# Patient Record
Sex: Female | Born: 1975 | Race: White | Hispanic: No | Marital: Single | Smoking: Former smoker
Health system: Southern US, Community
[De-identification: ages and names within clinical notes are randomized; demographics above are authoritative.]

## PROBLEM LIST (undated history)

## (undated) DIAGNOSIS — E119 Type 2 diabetes mellitus without complications: Secondary | ICD-10-CM

## (undated) DIAGNOSIS — N159 Renal tubulo-interstitial disease, unspecified: Secondary | ICD-10-CM

## (undated) DIAGNOSIS — K469 Unspecified abdominal hernia without obstruction or gangrene: Secondary | ICD-10-CM

## (undated) DIAGNOSIS — I5032 Chronic diastolic (congestive) heart failure: Secondary | ICD-10-CM

## (undated) DIAGNOSIS — I10 Essential (primary) hypertension: Secondary | ICD-10-CM

## (undated) DIAGNOSIS — I509 Heart failure, unspecified: Secondary | ICD-10-CM

## (undated) DIAGNOSIS — B029 Zoster without complications: Secondary | ICD-10-CM

## (undated) DIAGNOSIS — J45909 Unspecified asthma, uncomplicated: Secondary | ICD-10-CM

## (undated) HISTORY — PX: TUBAL LIGATION: SHX77

## (undated) HISTORY — PX: OTHER SURGICAL HISTORY: SHX169

## (undated) HISTORY — PX: ABDOMINAL SURGERY: SHX537

## (undated) HISTORY — PX: CHOLECYSTECTOMY: SHX55

---

## 1998-08-16 ENCOUNTER — Encounter: Payer: Self-pay | Admitting: Emergency Medicine

## 1998-08-16 ENCOUNTER — Emergency Department (HOSPITAL_COMMUNITY): Admission: EM | Admit: 1998-08-16 | Discharge: 1998-08-16 | Payer: Self-pay | Admitting: Emergency Medicine

## 1998-12-27 ENCOUNTER — Emergency Department (HOSPITAL_COMMUNITY): Admission: EM | Admit: 1998-12-27 | Discharge: 1998-12-27 | Payer: Self-pay | Admitting: Emergency Medicine

## 1999-07-02 ENCOUNTER — Emergency Department (HOSPITAL_COMMUNITY): Admission: EM | Admit: 1999-07-02 | Discharge: 1999-07-03 | Payer: Self-pay

## 1999-07-29 ENCOUNTER — Encounter: Payer: Self-pay | Admitting: Emergency Medicine

## 1999-07-29 ENCOUNTER — Emergency Department (HOSPITAL_COMMUNITY): Admission: EM | Admit: 1999-07-29 | Discharge: 1999-07-29 | Payer: Self-pay | Admitting: Emergency Medicine

## 2000-09-19 ENCOUNTER — Emergency Department (HOSPITAL_COMMUNITY): Admission: EM | Admit: 2000-09-19 | Discharge: 2000-09-19 | Payer: Self-pay | Admitting: *Deleted

## 2003-12-16 ENCOUNTER — Observation Stay: Payer: Self-pay | Admitting: Obstetrics & Gynecology

## 2003-12-19 ENCOUNTER — Inpatient Hospital Stay: Payer: Self-pay

## 2004-01-24 ENCOUNTER — Emergency Department: Payer: Self-pay | Admitting: Unknown Physician Specialty

## 2004-02-13 ENCOUNTER — Emergency Department: Payer: Self-pay | Admitting: Emergency Medicine

## 2004-02-13 ENCOUNTER — Other Ambulatory Visit: Payer: Self-pay

## 2004-05-02 ENCOUNTER — Emergency Department: Payer: Self-pay | Admitting: Emergency Medicine

## 2004-05-21 ENCOUNTER — Emergency Department: Payer: Self-pay | Admitting: Emergency Medicine

## 2004-06-07 ENCOUNTER — Emergency Department: Payer: Self-pay | Admitting: Emergency Medicine

## 2004-07-22 ENCOUNTER — Other Ambulatory Visit: Payer: Self-pay

## 2004-07-22 ENCOUNTER — Emergency Department: Payer: Self-pay | Admitting: Unknown Physician Specialty

## 2004-07-28 ENCOUNTER — Emergency Department: Payer: Self-pay | Admitting: Emergency Medicine

## 2004-11-10 ENCOUNTER — Emergency Department: Payer: Self-pay | Admitting: Emergency Medicine

## 2005-01-29 ENCOUNTER — Emergency Department: Payer: Self-pay | Admitting: Internal Medicine

## 2005-04-24 ENCOUNTER — Ambulatory Visit: Payer: Self-pay | Admitting: Surgery

## 2005-05-20 ENCOUNTER — Emergency Department: Payer: Self-pay | Admitting: Emergency Medicine

## 2005-08-27 ENCOUNTER — Emergency Department: Payer: Self-pay

## 2006-07-20 ENCOUNTER — Emergency Department: Payer: Self-pay | Admitting: Emergency Medicine

## 2006-07-27 ENCOUNTER — Emergency Department: Payer: Self-pay | Admitting: Emergency Medicine

## 2006-08-01 ENCOUNTER — Emergency Department: Payer: Self-pay | Admitting: Emergency Medicine

## 2006-09-22 ENCOUNTER — Emergency Department: Payer: Self-pay | Admitting: Emergency Medicine

## 2006-09-29 ENCOUNTER — Emergency Department: Payer: Self-pay

## 2006-12-25 ENCOUNTER — Emergency Department (HOSPITAL_COMMUNITY): Admission: EM | Admit: 2006-12-25 | Discharge: 2006-12-25 | Payer: Self-pay | Admitting: Emergency Medicine

## 2007-01-16 ENCOUNTER — Emergency Department: Payer: Self-pay | Admitting: Emergency Medicine

## 2007-03-13 ENCOUNTER — Emergency Department (HOSPITAL_COMMUNITY): Admission: EM | Admit: 2007-03-13 | Discharge: 2007-03-13 | Payer: Self-pay | Admitting: Emergency Medicine

## 2007-04-17 ENCOUNTER — Emergency Department: Payer: Self-pay | Admitting: Emergency Medicine

## 2007-04-25 ENCOUNTER — Emergency Department: Payer: Self-pay | Admitting: Internal Medicine

## 2007-06-01 ENCOUNTER — Other Ambulatory Visit: Payer: Self-pay

## 2007-06-01 ENCOUNTER — Emergency Department: Payer: Self-pay | Admitting: Emergency Medicine

## 2008-01-07 ENCOUNTER — Emergency Department: Payer: Self-pay | Admitting: Emergency Medicine

## 2008-02-29 ENCOUNTER — Emergency Department: Payer: Self-pay | Admitting: Unknown Physician Specialty

## 2008-05-17 ENCOUNTER — Emergency Department: Payer: Self-pay | Admitting: Unknown Physician Specialty

## 2008-05-20 ENCOUNTER — Emergency Department: Payer: Self-pay | Admitting: Emergency Medicine

## 2008-06-06 ENCOUNTER — Emergency Department: Payer: Self-pay | Admitting: Emergency Medicine

## 2008-06-10 ENCOUNTER — Emergency Department: Payer: Self-pay | Admitting: Emergency Medicine

## 2008-06-13 ENCOUNTER — Emergency Department: Payer: Self-pay | Admitting: Internal Medicine

## 2008-06-19 ENCOUNTER — Emergency Department: Payer: Self-pay | Admitting: Emergency Medicine

## 2008-06-24 ENCOUNTER — Emergency Department: Payer: Self-pay | Admitting: Emergency Medicine

## 2008-07-01 ENCOUNTER — Emergency Department: Payer: Self-pay | Admitting: Emergency Medicine

## 2008-07-13 ENCOUNTER — Emergency Department: Payer: Self-pay | Admitting: Emergency Medicine

## 2008-07-17 ENCOUNTER — Emergency Department: Payer: Self-pay | Admitting: Emergency Medicine

## 2008-07-24 ENCOUNTER — Emergency Department: Payer: Self-pay | Admitting: Emergency Medicine

## 2008-08-01 ENCOUNTER — Emergency Department: Payer: Self-pay | Admitting: Internal Medicine

## 2008-08-28 ENCOUNTER — Emergency Department: Payer: Self-pay | Admitting: Emergency Medicine

## 2008-10-27 ENCOUNTER — Emergency Department: Payer: Self-pay | Admitting: Emergency Medicine

## 2008-10-30 ENCOUNTER — Emergency Department: Payer: Self-pay | Admitting: Emergency Medicine

## 2008-11-02 ENCOUNTER — Emergency Department: Payer: Self-pay | Admitting: Emergency Medicine

## 2008-12-11 ENCOUNTER — Emergency Department: Payer: Self-pay | Admitting: Emergency Medicine

## 2008-12-12 ENCOUNTER — Emergency Department: Payer: Self-pay | Admitting: Emergency Medicine

## 2008-12-23 ENCOUNTER — Emergency Department: Payer: Self-pay | Admitting: Emergency Medicine

## 2008-12-28 ENCOUNTER — Emergency Department: Payer: Self-pay | Admitting: Emergency Medicine

## 2009-02-17 ENCOUNTER — Emergency Department: Payer: Self-pay | Admitting: Emergency Medicine

## 2009-02-20 ENCOUNTER — Emergency Department: Payer: Self-pay | Admitting: Emergency Medicine

## 2009-03-17 ENCOUNTER — Emergency Department: Payer: Self-pay | Admitting: Emergency Medicine

## 2009-03-29 ENCOUNTER — Emergency Department: Payer: Self-pay | Admitting: Emergency Medicine

## 2009-04-24 ENCOUNTER — Emergency Department: Payer: Self-pay | Admitting: Emergency Medicine

## 2009-06-14 ENCOUNTER — Emergency Department: Payer: Self-pay | Admitting: Emergency Medicine

## 2009-06-15 ENCOUNTER — Emergency Department (HOSPITAL_COMMUNITY): Admission: EM | Admit: 2009-06-15 | Discharge: 2009-06-16 | Payer: Self-pay | Admitting: Emergency Medicine

## 2009-06-19 ENCOUNTER — Emergency Department: Payer: Self-pay | Admitting: Unknown Physician Specialty

## 2009-06-23 ENCOUNTER — Emergency Department: Payer: Self-pay | Admitting: Emergency Medicine

## 2009-07-12 ENCOUNTER — Emergency Department: Payer: Self-pay | Admitting: Emergency Medicine

## 2009-07-15 ENCOUNTER — Emergency Department: Payer: Self-pay | Admitting: Emergency Medicine

## 2009-07-17 ENCOUNTER — Inpatient Hospital Stay: Payer: Self-pay | Admitting: Specialist

## 2009-07-21 LAB — PATHOLOGY REPORT

## 2009-08-08 ENCOUNTER — Emergency Department: Payer: Self-pay | Admitting: Emergency Medicine

## 2009-08-09 ENCOUNTER — Emergency Department: Payer: Self-pay | Admitting: Emergency Medicine

## 2009-08-11 ENCOUNTER — Emergency Department: Payer: Self-pay | Admitting: Emergency Medicine

## 2009-09-06 ENCOUNTER — Emergency Department: Payer: Self-pay | Admitting: Emergency Medicine

## 2009-09-09 ENCOUNTER — Emergency Department: Payer: Self-pay | Admitting: Emergency Medicine

## 2009-09-11 ENCOUNTER — Emergency Department: Payer: Self-pay | Admitting: Internal Medicine

## 2009-09-14 ENCOUNTER — Emergency Department: Payer: Self-pay | Admitting: Emergency Medicine

## 2009-10-26 ENCOUNTER — Emergency Department: Payer: Self-pay | Admitting: Emergency Medicine

## 2009-11-02 ENCOUNTER — Emergency Department (HOSPITAL_COMMUNITY): Admission: EM | Admit: 2009-11-02 | Discharge: 2009-11-02 | Payer: Self-pay | Admitting: Emergency Medicine

## 2009-11-08 ENCOUNTER — Emergency Department: Payer: Self-pay | Admitting: Unknown Physician Specialty

## 2009-11-11 ENCOUNTER — Emergency Department (HOSPITAL_COMMUNITY): Admission: EM | Admit: 2009-11-11 | Discharge: 2009-11-11 | Payer: Self-pay | Admitting: Emergency Medicine

## 2009-11-14 ENCOUNTER — Emergency Department: Payer: Self-pay | Admitting: Emergency Medicine

## 2009-12-09 ENCOUNTER — Emergency Department: Payer: Self-pay | Admitting: Emergency Medicine

## 2010-01-02 ENCOUNTER — Emergency Department: Payer: Self-pay | Admitting: Emergency Medicine

## 2010-01-09 ENCOUNTER — Emergency Department: Payer: Self-pay | Admitting: Emergency Medicine

## 2010-01-11 ENCOUNTER — Emergency Department: Payer: Self-pay | Admitting: Unknown Physician Specialty

## 2010-01-12 ENCOUNTER — Emergency Department (HOSPITAL_COMMUNITY)
Admission: EM | Admit: 2010-01-12 | Discharge: 2010-01-12 | Payer: Self-pay | Source: Home / Self Care | Admitting: Emergency Medicine

## 2010-01-12 LAB — URINALYSIS, ROUTINE W REFLEX MICROSCOPIC
Bilirubin Urine: NEGATIVE
Ketones, ur: NEGATIVE mg/dL
Leukocytes, UA: NEGATIVE
Nitrite: NEGATIVE
Protein, ur: 30 mg/dL — AB
Specific Gravity, Urine: 1.031 — ABNORMAL HIGH (ref 1.005–1.030)
Urine Glucose, Fasting: >1000 mg/dL — AB
Urobilinogen, UA: 0.2 mg/dL (ref 0.0–1.0)
pH: 5.5 (ref 5.0–8.0)

## 2010-01-12 LAB — POCT I-STAT, CHEM 8
BUN: 8 mg/dL (ref 6–23)
Calcium, Ion: 1.05 mmol/L — ABNORMAL LOW (ref 1.12–1.32)
Chloride: 100 mEq/L (ref 96–112)
Creatinine, Ser: 0.7 mg/dL (ref 0.4–1.2)
Glucose, Bld: 230 mg/dL — ABNORMAL HIGH (ref 70–99)
HCT: 40 % (ref 36.0–46.0)
Hemoglobin: 13.6 g/dL (ref 12.0–15.0)
Potassium: 3.7 mEq/L (ref 3.5–5.1)
Sodium: 136 mEq/L (ref 135–145)
TCO2: 26 mmol/L (ref 0–100)

## 2010-01-12 LAB — URINE MICROSCOPIC-ADD ON

## 2010-01-12 LAB — GLUCOSE, CAPILLARY: Glucose-Capillary: 232 mg/dL — ABNORMAL HIGH (ref 70–99)

## 2010-01-12 LAB — POCT PREGNANCY, URINE: Preg Test, Ur: NEGATIVE

## 2010-01-14 ENCOUNTER — Emergency Department: Payer: Self-pay | Admitting: Emergency Medicine

## 2010-03-05 ENCOUNTER — Inpatient Hospital Stay: Payer: Self-pay | Admitting: *Deleted

## 2010-03-17 ENCOUNTER — Emergency Department: Payer: Self-pay | Admitting: Emergency Medicine

## 2010-03-22 LAB — URINE MICROSCOPIC-ADD ON

## 2010-03-22 LAB — URINALYSIS, ROUTINE W REFLEX MICROSCOPIC
Bilirubin Urine: NEGATIVE
Glucose, UA: 250 mg/dL — AB
Ketones, ur: NEGATIVE mg/dL
Nitrite: NEGATIVE
Protein, ur: 30 mg/dL — AB
Specific Gravity, Urine: 1.015 (ref 1.005–1.030)
Urobilinogen, UA: 0.2 mg/dL (ref 0.0–1.0)
pH: 6.5 (ref 5.0–8.0)

## 2010-03-22 LAB — GLUCOSE, CAPILLARY: Glucose-Capillary: 291 mg/dL — ABNORMAL HIGH (ref 70–99)

## 2010-03-22 LAB — POCT PREGNANCY, URINE: Preg Test, Ur: NEGATIVE

## 2010-03-23 LAB — DIFFERENTIAL
Basophils Absolute: 0 10*3/uL (ref 0.0–0.1)
Lymphocytes Relative: 25 % (ref 12–46)
Monocytes Absolute: 0.7 10*3/uL (ref 0.1–1.0)
Neutro Abs: 8 10*3/uL — ABNORMAL HIGH (ref 1.7–7.7)
Neutrophils Relative %: 68 % (ref 43–77)

## 2010-03-23 LAB — PREGNANCY, URINE: Preg Test, Ur: NEGATIVE

## 2010-03-23 LAB — URINALYSIS, ROUTINE W REFLEX MICROSCOPIC
Bilirubin Urine: NEGATIVE
Glucose, UA: 100 mg/dL — AB
Hgb urine dipstick: NEGATIVE
Ketones, ur: NEGATIVE mg/dL
Nitrite: NEGATIVE
Protein, ur: NEGATIVE mg/dL
Specific Gravity, Urine: 1.022 (ref 1.005–1.030)
Urobilinogen, UA: 1 mg/dL (ref 0.0–1.0)
pH: 6.5 (ref 5.0–8.0)

## 2010-03-23 LAB — CBC
HCT: 36.5 % (ref 36.0–46.0)
MCH: 21 pg — ABNORMAL LOW (ref 26.0–34.0)
MCV: 70.9 fL — ABNORMAL LOW (ref 78.0–100.0)
Platelets: 253 10*3/uL (ref 150–400)
RBC: 5.15 MIL/uL — ABNORMAL HIGH (ref 3.87–5.11)
RDW: 18.1 % — ABNORMAL HIGH (ref 11.5–15.5)

## 2010-03-23 LAB — POCT I-STAT 3, VENOUS BLOOD GAS (G3P V)
Acid-Base Excess: 1 mmol/L (ref 0.0–2.0)
Bicarbonate: 26.9 mEq/L — ABNORMAL HIGH (ref 20.0–24.0)
O2 Saturation: 53 %
TCO2: 28 mmol/L (ref 0–100)
pCO2, Ven: 46.4 mmHg (ref 45.0–50.0)
pH, Ven: 7.371 — ABNORMAL HIGH (ref 7.250–7.300)
pO2, Ven: 29 mmHg — CL (ref 30.0–45.0)

## 2010-03-23 LAB — COMPREHENSIVE METABOLIC PANEL
Albumin: 2.9 g/dL — ABNORMAL LOW (ref 3.5–5.2)
BUN: 9 mg/dL (ref 6–23)
Chloride: 102 mEq/L (ref 96–112)
Creatinine, Ser: 0.64 mg/dL (ref 0.4–1.2)
Glucose, Bld: 224 mg/dL — ABNORMAL HIGH (ref 70–99)
Total Bilirubin: 0.5 mg/dL (ref 0.3–1.2)

## 2010-03-23 LAB — GLUCOSE, CAPILLARY: Glucose-Capillary: 208 mg/dL — ABNORMAL HIGH (ref 70–99)

## 2010-03-23 LAB — LIPASE, BLOOD: Lipase: 24 U/L (ref 11–59)

## 2010-04-07 ENCOUNTER — Ambulatory Visit: Payer: Self-pay | Admitting: Gastroenterology

## 2010-04-20 ENCOUNTER — Emergency Department: Payer: Self-pay | Admitting: Emergency Medicine

## 2010-05-25 ENCOUNTER — Emergency Department: Payer: Self-pay | Admitting: Emergency Medicine

## 2010-08-17 ENCOUNTER — Emergency Department: Payer: Self-pay | Admitting: Emergency Medicine

## 2010-09-11 ENCOUNTER — Emergency Department: Payer: Self-pay | Admitting: Emergency Medicine

## 2010-09-16 ENCOUNTER — Emergency Department: Payer: Self-pay

## 2010-10-03 LAB — COMPREHENSIVE METABOLIC PANEL
ALT: 17
AST: 16
CO2: 30
Calcium: 8.3 — ABNORMAL LOW
Creatinine, Ser: 0.65
GFR calc Af Amer: >60
GFR calc non Af Amer: >60
Glucose, Bld: 87
Sodium: 135
Total Protein: 6.4

## 2010-10-03 LAB — URINALYSIS, ROUTINE W REFLEX MICROSCOPIC
Glucose, UA: NEGATIVE
Nitrite: NEGATIVE
Specific Gravity, Urine: 1.024
pH: 7

## 2010-10-03 LAB — DIFFERENTIAL
Eosinophils Absolute: 0.2
Lymphocytes Relative: 29
Lymphs Abs: 2.9
Monocytes Relative: 7
Neutrophils Relative %: 62

## 2010-10-03 LAB — CBC
MCHC: 31.8
MCV: 73.2 — ABNORMAL LOW
RDW: 16.7 — ABNORMAL HIGH

## 2010-10-03 LAB — URINE MICROSCOPIC-ADD ON

## 2010-10-14 LAB — URINALYSIS, ROUTINE W REFLEX MICROSCOPIC
Glucose, UA: NEGATIVE
pH: 6.5

## 2010-10-14 LAB — I-STAT 8, (EC8 V) (CONVERTED LAB)
Acid-base deficit: 1
BUN: 15
Chloride: 106
HCT: 40
Potassium: 4.6
pH, Ven: 7.307 — ABNORMAL HIGH

## 2010-10-14 LAB — DIFFERENTIAL
Basophils Absolute: 0.1
Lymphocytes Relative: 24
Lymphs Abs: 2.5
Monocytes Absolute: 0.8
Neutro Abs: 7

## 2010-10-14 LAB — CBC
HCT: 36
Hemoglobin: 11.4 — ABNORMAL LOW
RBC: 4.77
RDW: 18 — ABNORMAL HIGH
WBC: 10.5

## 2010-10-14 LAB — URINE MICROSCOPIC-ADD ON

## 2010-10-14 LAB — URINE CULTURE

## 2010-10-14 LAB — POCT I-STAT CREATININE
Creatinine, Ser: 0.9
Operator id: 198171

## 2010-11-13 ENCOUNTER — Emergency Department: Payer: Self-pay | Admitting: Emergency Medicine

## 2010-12-05 ENCOUNTER — Emergency Department: Payer: Self-pay | Admitting: *Deleted

## 2011-03-09 ENCOUNTER — Emergency Department: Payer: Self-pay | Admitting: Emergency Medicine

## 2011-06-26 ENCOUNTER — Emergency Department: Payer: Self-pay | Admitting: Emergency Medicine

## 2011-09-10 ENCOUNTER — Emergency Department: Payer: Self-pay | Admitting: Emergency Medicine

## 2011-11-12 ENCOUNTER — Encounter (HOSPITAL_COMMUNITY): Payer: Self-pay | Admitting: Physical Medicine and Rehabilitation

## 2011-11-12 ENCOUNTER — Emergency Department (HOSPITAL_COMMUNITY)
Admission: EM | Admit: 2011-11-12 | Discharge: 2011-11-12 | Disposition: A | Discharge status: Home / Self Care | Payer: Medicaid Other | Attending: Emergency Medicine | Admitting: Emergency Medicine

## 2011-11-12 DIAGNOSIS — G8921 Chronic pain due to trauma: Secondary | ICD-10-CM | POA: Insufficient documentation

## 2011-11-12 DIAGNOSIS — G8929 Other chronic pain: Secondary | ICD-10-CM

## 2011-11-12 DIAGNOSIS — J45909 Unspecified asthma, uncomplicated: Secondary | ICD-10-CM | POA: Insufficient documentation

## 2011-11-12 DIAGNOSIS — Z794 Long term (current) use of insulin: Secondary | ICD-10-CM | POA: Insufficient documentation

## 2011-11-12 DIAGNOSIS — Z8719 Personal history of other diseases of the digestive system: Secondary | ICD-10-CM | POA: Insufficient documentation

## 2011-11-12 DIAGNOSIS — E119 Type 2 diabetes mellitus without complications: Secondary | ICD-10-CM | POA: Insufficient documentation

## 2011-11-12 DIAGNOSIS — Z79899 Other long term (current) drug therapy: Secondary | ICD-10-CM | POA: Insufficient documentation

## 2011-11-12 HISTORY — DX: Unspecified asthma, uncomplicated: J45.909

## 2011-11-12 HISTORY — DX: Unspecified abdominal hernia without obstruction or gangrene: K46.9

## 2011-11-12 HISTORY — DX: Type 2 diabetes mellitus without complications: E11.9

## 2011-11-12 LAB — URINALYSIS, ROUTINE W REFLEX MICROSCOPIC
Bilirubin Urine: NEGATIVE
Hgb urine dipstick: NEGATIVE
Protein, ur: 100 mg/dL — AB
Urobilinogen, UA: 1 mg/dL (ref 0.0–1.0)

## 2011-11-12 LAB — URINE MICROSCOPIC-ADD ON
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
## 2011-11-12 NOTE — ED Notes (Signed)
Patient very agitated and complaining about "the way she been treated". Calmed and reassured patient.

## 2011-12-30 ENCOUNTER — Emergency Department: Payer: Self-pay | Admitting: Emergency Medicine

## 2011-12-30 LAB — URINALYSIS, COMPLETE
Bacteria: NONE SEEN
Bilirubin,UR: NEGATIVE
Glucose,UR: >=500 mg/dL (ref 0–75)
Ketone: NEGATIVE
Nitrite: NEGATIVE
Ph: 6 (ref 4.5–8.0)
Specific Gravity: 1.028 (ref 1.003–1.030)
Squamous Epithelial: 1

## 2011-12-30 LAB — CBC
HGB: 11.9 g/dL — ABNORMAL LOW (ref 12.0–16.0)
MCH: 26.7 pg (ref 26.0–34.0)
MCV: 82 fL (ref 80–100)
Platelet: 235 10*3/uL (ref 150–440)
RBC: 4.46 10*6/uL (ref 3.80–5.20)

## 2011-12-30 LAB — COMPREHENSIVE METABOLIC PANEL
Albumin: 2.9 g/dL — ABNORMAL LOW (ref 3.4–5.0)
Anion Gap: 10 (ref 7–16)
Calcium, Total: 8.3 mg/dL — ABNORMAL LOW (ref 8.5–10.1)
Chloride: 100 mmol/L (ref 98–107)
EGFR (Non-African Amer.): >60
Glucose: 399 mg/dL — ABNORMAL HIGH (ref 65–99)
Osmolality: 282 (ref 275–301)
Potassium: 4.4 mmol/L (ref 3.5–5.1)
SGOT(AST): 37 U/L (ref 15–37)
Sodium: 133 mmol/L — ABNORMAL LOW (ref 136–145)

## 2011-12-30 LAB — TROPONIN I: Troponin-I: <0.02 ng/mL

## 2012-02-13 ENCOUNTER — Emergency Department: Payer: Self-pay | Admitting: Emergency Medicine

## 2012-02-14 LAB — URINALYSIS, COMPLETE
Bilirubin,UR: NEGATIVE
Glucose,UR: >=500 mg/dL (ref 0–75)
Ketone: NEGATIVE
Nitrite: NEGATIVE
Protein: NEGATIVE
RBC,UR: 3 /HPF (ref 0–5)
Specific Gravity: 1.027 (ref 1.003–1.030)
Squamous Epithelial: 3

## 2012-02-14 LAB — BASIC METABOLIC PANEL
Anion Gap: 9 (ref 7–16)
BUN: 11 mg/dL (ref 7–18)
Calcium, Total: 8.9 mg/dL (ref 8.5–10.1)
Creatinine: 0.79 mg/dL (ref 0.60–1.30)
EGFR (African American): >60
EGFR (Non-African Amer.): >60
Osmolality: 280 (ref 275–301)

## 2012-02-14 LAB — CBC
HCT: 40.8 % (ref 35.0–47.0)
HGB: 12.6 g/dL (ref 12.0–16.0)
MCH: 25.8 pg — ABNORMAL LOW (ref 26.0–34.0)
MCHC: 30.9 g/dL — ABNORMAL LOW (ref 32.0–36.0)
MCV: 84 fL (ref 80–100)
Platelet: 277 10*3/uL (ref 150–440)
RBC: 4.89 10*6/uL (ref 3.80–5.20)
RDW: 15.8 % — ABNORMAL HIGH (ref 11.5–14.5)

## 2012-02-16 LAB — BASIC METABOLIC PANEL
BUN: 7 mg/dL (ref 7–18)
Creatinine: 0.81 mg/dL (ref 0.60–1.30)
EGFR (African American): >60
EGFR (Non-African Amer.): >60
Glucose: 303 mg/dL — ABNORMAL HIGH (ref 65–99)
Osmolality: 276 (ref 275–301)
Potassium: 3.7 mmol/L (ref 3.5–5.1)

## 2012-02-16 LAB — URINALYSIS, COMPLETE
Bilirubin,UR: NEGATIVE
Glucose,UR: >=500 mg/dL (ref 0–75)
Nitrite: NEGATIVE
Ph: 5 (ref 4.5–8.0)
Protein: 30
Specific Gravity: 1.036 (ref 1.003–1.030)

## 2012-02-16 LAB — CBC
MCH: 26.8 pg (ref 26.0–34.0)
MCHC: 32.1 g/dL (ref 32.0–36.0)
MCV: 83 fL (ref 80–100)
RBC: 5.33 10*6/uL — ABNORMAL HIGH (ref 3.80–5.20)
RDW: 15.2 % — ABNORMAL HIGH (ref 11.5–14.5)
WBC: 13.6 10*3/uL — ABNORMAL HIGH (ref 3.6–11.0)

## 2012-02-17 ENCOUNTER — Observation Stay: Payer: Self-pay | Admitting: Internal Medicine

## 2012-02-17 LAB — CBC WITH DIFFERENTIAL/PLATELET
Basophil #: 0 10*3/uL (ref 0.0–0.1)
Basophil %: 0.5 %
Eosinophil #: 0.1 10*3/uL (ref 0.0–0.7)
HGB: 12.1 g/dL (ref 12.0–16.0)
Lymphocyte %: 23.9 %
MCHC: 32.1 g/dL (ref 32.0–36.0)
Monocyte #: 0.4 x10 3/mm (ref 0.2–0.9)
Neutrophil %: 69.1 %
Platelet: 206 10*3/uL (ref 150–440)
WBC: 7.4 10*3/uL (ref 3.6–11.0)

## 2012-02-17 LAB — COMPREHENSIVE METABOLIC PANEL
Albumin: 2.7 g/dL — ABNORMAL LOW (ref 3.4–5.0)
BUN: 5 mg/dL — ABNORMAL LOW (ref 7–18)
Bilirubin,Total: 0.4 mg/dL (ref 0.2–1.0)
Co2: 25 mmol/L (ref 21–32)
Creatinine: 0.84 mg/dL (ref 0.60–1.30)
EGFR (African American): >60
EGFR (Non-African Amer.): >60
Osmolality: 278 (ref 275–301)
Potassium: 3.7 mmol/L (ref 3.5–5.1)
SGOT(AST): 99 U/L — ABNORMAL HIGH (ref 15–37)
SGPT (ALT): 66 U/L (ref 12–78)
Sodium: 133 mmol/L — ABNORMAL LOW (ref 136–145)

## 2012-02-17 LAB — HEPATIC FUNCTION PANEL A (ARMC)
Albumin: 3.3 g/dL — ABNORMAL LOW (ref 3.4–5.0)
Alkaline Phosphatase: 81 U/L (ref 50–136)
Bilirubin, Direct: 0.1 mg/dL (ref 0.00–0.20)
Bilirubin,Total: 0.5 mg/dL (ref 0.2–1.0)
SGPT (ALT): 69 U/L (ref 12–78)

## 2012-02-17 LAB — LIPASE, BLOOD: Lipase: 101 U/L (ref 73–393)

## 2012-02-18 LAB — HEPATIC FUNCTION PANEL A (ARMC)
Alkaline Phosphatase: 65 U/L (ref 50–136)
Bilirubin,Total: 0.2 mg/dL (ref 0.2–1.0)
SGOT(AST): 69 U/L — ABNORMAL HIGH (ref 15–37)
Total Protein: 5.1 g/dL — ABNORMAL LOW (ref 6.4–8.2)

## 2012-02-18 LAB — HEMOGLOBIN A1C: Hemoglobin A1C: 11.5 % — ABNORMAL HIGH (ref 4.2–6.3)

## 2012-02-18 LAB — SODIUM: Sodium: 139 mmol/L (ref 136–145)

## 2012-02-18 LAB — HEMOGLOBIN
HGB: 10.9 g/dL — ABNORMAL LOW (ref 12.0–16.0)
HGB: 11.2 g/dL — ABNORMAL LOW (ref 12.0–16.0)

## 2012-02-19 LAB — URINALYSIS, COMPLETE
Bilirubin,UR: NEGATIVE
Ph: 5 (ref 4.5–8.0)
Protein: NEGATIVE
RBC,UR: <1 /HPF (ref 0–5)
Specific Gravity: 1.015 (ref 1.003–1.030)
Squamous Epithelial: 6
WBC UR: 3 /HPF (ref 0–5)

## 2012-02-20 LAB — CBC WITH DIFFERENTIAL/PLATELET
Basophil #: 0 10*3/uL (ref 0.0–0.1)
Eosinophil %: 1.5 %
HCT: 34.1 % — ABNORMAL LOW (ref 35.0–47.0)
HGB: 11.3 g/dL — ABNORMAL LOW (ref 12.0–16.0)
Lymphocyte %: 41.1 %
MCH: 27.6 pg (ref 26.0–34.0)
MCHC: 33.2 g/dL (ref 32.0–36.0)
MCV: 83 fL (ref 80–100)
Monocyte #: 0.6 x10 3/mm (ref 0.2–0.9)
Neutrophil %: 50.4 %
Platelet: 193 10*3/uL (ref 150–440)
RDW: 15.4 % — ABNORMAL HIGH (ref 11.5–14.5)
WBC: 8.7 10*3/uL (ref 3.6–11.0)

## 2012-02-20 LAB — BASIC METABOLIC PANEL
Anion Gap: 5 — ABNORMAL LOW (ref 7–16)
BUN: 6 mg/dL — ABNORMAL LOW (ref 7–18)
Calcium, Total: 8.2 mg/dL — ABNORMAL LOW (ref 8.5–10.1)
Chloride: 104 mmol/L (ref 98–107)
Osmolality: 277 (ref 275–301)
Potassium: 3.8 mmol/L (ref 3.5–5.1)
Sodium: 136 mmol/L (ref 136–145)

## 2012-02-22 LAB — CULTURE, BLOOD (SINGLE)

## 2012-02-25 ENCOUNTER — Emergency Department: Payer: Self-pay | Admitting: Emergency Medicine

## 2012-02-25 LAB — URINALYSIS, COMPLETE
Glucose,UR: NEGATIVE mg/dL (ref 0–75)
Ketone: NEGATIVE
Squamous Epithelial: 9
WBC UR: 3 /HPF (ref 0–5)

## 2012-02-25 LAB — COMPREHENSIVE METABOLIC PANEL
Albumin: 3 g/dL — ABNORMAL LOW (ref 3.4–5.0)
Anion Gap: 6 — ABNORMAL LOW (ref 7–16)
Bilirubin,Total: 0.2 mg/dL (ref 0.2–1.0)
Creatinine: 0.86 mg/dL (ref 0.60–1.30)
EGFR (African American): >60
EGFR (Non-African Amer.): >60
Osmolality: 270 (ref 275–301)
Potassium: 4.5 mmol/L (ref 3.5–5.1)
SGOT(AST): 36 U/L (ref 15–37)
Total Protein: 7.2 g/dL (ref 6.4–8.2)

## 2012-02-25 LAB — CBC
HGB: 12.1 g/dL (ref 12.0–16.0)
MCHC: 31.7 g/dL — ABNORMAL LOW (ref 32.0–36.0)
RBC: 4.59 10*6/uL (ref 3.80–5.20)
RDW: 15.2 % — ABNORMAL HIGH (ref 11.5–14.5)

## 2012-02-25 LAB — LIPASE, BLOOD: Lipase: 107 U/L (ref 73–393)

## 2012-03-05 ENCOUNTER — Emergency Department: Payer: Self-pay | Admitting: Emergency Medicine

## 2012-03-05 LAB — CBC
HGB: 12.2 g/dL (ref 12.0–16.0)
MCH: 26.6 pg (ref 26.0–34.0)
MCHC: 32 g/dL (ref 32.0–36.0)
MCV: 83 fL (ref 80–100)
Platelet: 268 10*3/uL (ref 150–440)
RBC: 4.59 10*6/uL (ref 3.80–5.20)
WBC: 10.6 10*3/uL (ref 3.6–11.0)

## 2012-03-05 LAB — COMPREHENSIVE METABOLIC PANEL
Alkaline Phosphatase: 75 U/L (ref 50–136)
BUN: 11 mg/dL (ref 7–18)
Bilirubin,Total: 0.2 mg/dL (ref 0.2–1.0)
Calcium, Total: 8.5 mg/dL (ref 8.5–10.1)
Creatinine: 0.86 mg/dL (ref 0.60–1.30)
EGFR (African American): >60
EGFR (Non-African Amer.): >60
Osmolality: 280 (ref 275–301)
Potassium: 4.2 mmol/L (ref 3.5–5.1)
SGPT (ALT): 35 U/L (ref 12–78)
Sodium: 133 mmol/L — ABNORMAL LOW (ref 136–145)
Total Protein: 7.2 g/dL (ref 6.4–8.2)

## 2012-03-05 LAB — URINALYSIS, COMPLETE
Bilirubin,UR: NEGATIVE
Blood: NEGATIVE
Glucose,UR: >=500 mg/dL (ref 0–75)
Ketone: NEGATIVE
Specific Gravity: 1.025 (ref 1.003–1.030)
Squamous Epithelial: 4

## 2012-03-11 ENCOUNTER — Emergency Department (HOSPITAL_COMMUNITY)
Admission: EM | Admit: 2012-03-11 | Discharge: 2012-03-11 | Disposition: A | Discharge status: Home / Self Care | Payer: Medicaid Other | Attending: Emergency Medicine | Admitting: Emergency Medicine

## 2012-03-11 DIAGNOSIS — K625 Hemorrhage of anus and rectum: Secondary | ICD-10-CM

## 2012-03-11 DIAGNOSIS — G8929 Other chronic pain: Secondary | ICD-10-CM | POA: Insufficient documentation

## 2012-03-11 DIAGNOSIS — Z794 Long term (current) use of insulin: Secondary | ICD-10-CM | POA: Insufficient documentation

## 2012-03-11 DIAGNOSIS — M545 Low back pain, unspecified: Secondary | ICD-10-CM | POA: Insufficient documentation

## 2012-03-11 DIAGNOSIS — J45909 Unspecified asthma, uncomplicated: Secondary | ICD-10-CM | POA: Insufficient documentation

## 2012-03-11 DIAGNOSIS — Z79899 Other long term (current) drug therapy: Secondary | ICD-10-CM | POA: Insufficient documentation

## 2012-03-11 DIAGNOSIS — E119 Type 2 diabetes mellitus without complications: Secondary | ICD-10-CM | POA: Insufficient documentation

## 2012-03-11 DIAGNOSIS — Z8719 Personal history of other diseases of the digestive system: Secondary | ICD-10-CM | POA: Insufficient documentation

## 2012-03-11 DIAGNOSIS — R109 Unspecified abdominal pain: Secondary | ICD-10-CM | POA: Insufficient documentation

## 2012-03-11 LAB — COMPREHENSIVE METABOLIC PANEL
Albumin: 2.8 g/dL — ABNORMAL LOW (ref 3.5–5.2)
Alkaline Phosphatase: 59 U/L (ref 39–117)
BUN: 12 mg/dL (ref 6–23)
CO2: 27 mEq/L (ref 19–32)
Chloride: 99 mEq/L (ref 96–112)
Creatinine, Ser: 0.68 mg/dL (ref 0.50–1.10)
GFR calc Af Amer: >90 mL/min (ref >90)
GFR calc non Af Amer: >90 mL/min (ref >90)
Glucose, Bld: 298 mg/dL — ABNORMAL HIGH (ref 70–99)
Potassium: 4.1 mEq/L (ref 3.5–5.1)
Total Bilirubin: 0.2 mg/dL — ABNORMAL LOW (ref 0.3–1.2)

## 2012-03-11 LAB — URINALYSIS, ROUTINE W REFLEX MICROSCOPIC
Glucose, UA: >1000 mg/dL — AB
Ketones, ur: NEGATIVE mg/dL
Leukocytes, UA: NEGATIVE
Nitrite: NEGATIVE
Protein, ur: NEGATIVE mg/dL
Urobilinogen, UA: 0.2 mg/dL (ref 0.0–1.0)

## 2012-03-11 LAB — CBC WITH DIFFERENTIAL/PLATELET
Basophils Relative: 0 % (ref 0–1)
HCT: 35.3 % — ABNORMAL LOW (ref 36.0–46.0)
Hemoglobin: 11.5 g/dL — ABNORMAL LOW (ref 12.0–15.0)
Lymphs Abs: 4.2 10*3/uL — ABNORMAL HIGH (ref 0.7–4.0)
MCHC: 32.6 g/dL (ref 30.0–36.0)
Monocytes Absolute: 0.7 10*3/uL (ref 0.1–1.0)
Monocytes Relative: 6 % (ref 3–12)
Neutro Abs: 7.7 10*3/uL (ref 1.7–7.7)
Neutrophils Relative %: 60 % (ref 43–77)
RBC: 4.34 MIL/uL (ref 3.87–5.11)

## 2012-03-11 LAB — URINE MICROSCOPIC-ADD ON

## 2012-03-11 LAB — OCCULT BLOOD, POC DEVICE: Fecal Occult Bld: POSITIVE — AB

## 2012-03-11 MED ORDER — FAMOTIDINE 20 MG PO TABS
20.0000 mg | ORAL_TABLET | Freq: Once | ORAL | Status: AC
  Administered 2012-03-11: 20 mg via ORAL
  Filled 2012-03-11: qty 1

## 2012-03-11 MED ORDER — GI COCKTAIL ~~LOC~~
30.0000 mL | Freq: Once | ORAL | Status: AC
  Administered 2012-03-11: 30 mL via ORAL
  Filled 2012-03-11: qty 30

## 2012-03-11 MED ORDER — HYDROCODONE-ACETAMINOPHEN 5-325 MG PO TABS: ORAL_TABLET | ORAL | Status: DC

## 2012-03-11 MED ORDER — CYCLOBENZAPRINE HCL 10 MG PO TABS: 10.0000 mg | ORAL_TABLET | Freq: Three times a day (TID) | ORAL | Status: DC | PRN

## 2012-03-11 MED ORDER — CYCLOBENZAPRINE HCL 10 MG PO TABS
5.0000 mg | ORAL_TABLET | Freq: Once | ORAL | Status: AC
  Administered 2012-03-11: 5 mg via ORAL
  Filled 2012-03-11: qty 1

## 2012-03-11 MED ORDER — ONDANSETRON HCL 4 MG PO TABS: 4.0000 mg | ORAL_TABLET | Freq: Four times a day (QID) | ORAL | Status: DC

## 2012-03-11 MED ORDER — HYDROCODONE-ACETAMINOPHEN 5-325 MG PO TABS
2.0000 | ORAL_TABLET | Freq: Once | ORAL | Status: AC
  Administered 2012-03-11: 2 via ORAL
  Filled 2012-03-11: qty 2

## 2012-03-11 NOTE — ED Provider Notes (Signed)
History     CSN: [id number]  Arrival date & time 03/11/12  0025   First MD Initiated Contact with Patient 03/11/12 0132      Chief Complaint  Patient presents with  . Rectal Bleeding    (Consider location/radiation/quality/duration/timing/severity/associated sxs/prior treatment) HPI  Patient reports she was recently admitted to [facility] and was discharged on February 12. She reports she has cyst on her kidneys and she has back pain that radiates bilaterally into her abdomen and has a constant pressure pain in her back with burning in her abdomen. She states it feels like she has lighters being lit within her abdomen. She also complains of abdominal bloating. She reports for the past 8 years she's had nausea and vomiting 15-20 times a day and chronic diarrhea. She also reports she's had rectal bleeding for the past 4 weeks every time she has a stool. She denies fever, feeling weaker and states sometimes she feels lightheaded. She states the bleeding has gotten heavier. She reports she's supposed to have a colonoscopy done in 3 days by Dr. Bluford Kaufmann. When asked what tests they did [facility] she said "they did every test possible".  PCP none  Past Medical History  Diagnosis Date  . Diabetes mellitus without complication   . Asthma   . Hernia     No past surgical history on file.  No family history on file.  History  Substance Use Topics  . Smoking status: Never Smoker   . Smokeless tobacco: Not on file  . Alcohol Use: No   unemployed  OB History   Grav Para Term Preterm Abortions TAB SAB Ect Mult Living                  Review of Systems  All other systems reviewed and are negative.    Allergies  Morphine and related; Darvocet; Toradol; and Ultram  Home Medications   Current Outpatient Rx  Name  Route  Sig  Dispense  Refill  . albuterol (PROVENTIL HFA;VENTOLIN HFA) 108 (90 BASE) MCG/ACT inhaler   Inhalation   Inhale 2 puffs into the  lungs every 4 (four) hours as needed for wheezing.         . insulin glargine (LANTUS) 100 UNIT/ML injection   Subcutaneous   Inject 50 Units into the skin daily.          Marland Kitchen lisinopril (PRINIVIL,ZESTRIL) 10 MG tablet   Oral   Take 10 mg by mouth daily.         . metFORMIN (GLUCOPHAGE) 500 MG tablet   Oral   Take 500 mg by mouth 2 (two) times daily with a meal.         . norethindrone-ethinyl estradiol 1/35 (NORTREL 1/35, 21,) tablet   Oral   Take 1 tablet by mouth daily.         Marland Kitchen nystatin cream (MYCOSTATIN)   Topical   Apply 1 application topically every 4 (four) hours as needed for dry skin (itching/pain).         Marland Kitchen orphenadrine (NORFLEX) 100 MG tablet   Oral   Take 100 mg by mouth 2 (two) times daily.         Marland Kitchen oxyCODONE-acetaminophen (PERCOCET) 10-325 MG per tablet   Oral   Take 2 tablets by mouth every 4 (four) hours as needed for pain.           BP 130/70  Pulse 97  Temp(Src) 98.8 F (37.1 C) (  Oral)  SpO2 94%  Vital signs normal    Physical Exam  Nursing note and vitals reviewed. Constitutional: She is oriented to person, place, and time. She appears well-developed and well-nourished.  Non-toxic appearance. She does not appear ill. No distress.  Obese  HENT:  Head: Normocephalic and atraumatic.  Right Ear: External ear normal.  Left Ear: External ear normal.  Nose: Nose normal. No mucosal edema or rhinorrhea.  Mouth/Throat: Oropharynx is clear and moist and mucous membranes are normal. No dental abscesses or edematous.  Eyes: Conjunctivae and EOM are normal. Pupils are equal, round, and reactive to light.  Neck: Normal range of motion and full passive range of motion without pain. Neck supple.  Cardiovascular: Normal rate, regular rhythm and normal heart sounds.  Exam reveals no gallop and no friction rub.   No murmur heard. Pulmonary/Chest: Effort normal and breath sounds normal. No respiratory distress. She has no wheezes. She has no  rhonchi. She has no rales. She exhibits no tenderness and no crepitus.  Abdominal: Soft. Normal appearance and bowel sounds are normal. She exhibits no distension. There is no tenderness. There is no rebound and no guarding.  Large abdomen with a large ventral hernia felt at though level of the umbilicus and below on the left-hand side.  Genitourinary:  Normal external rectal exam without hemorrhoids. Minimal amount of stool present but appears to be yellow.  Musculoskeletal: Normal range of motion. She exhibits no edema and no tenderness.  Moves all extremities well.   Tender diffusely in her lower lumbar spine and along both flank areas.  Neurological: She is alert and oriented to person, place, and time. She has normal strength. No cranial nerve deficit.  Skin: Skin is warm, dry and intact. No rash noted. No erythema. No pallor.  Psychiatric: She has a normal mood and affect. Her speech is normal and behavior is normal. Her mood appears not anxious.    ED Course  Procedures (including critical care time)  Patient insisted her teenage daughters stay in the room while she gets her rectal exam  06:50 records obtained from [facility]. She was admitted from February 8 to February 12. Her discharge diagnosis was diffuse abdominal pain questionable viral gastritis, chronic back pain pyuria without UTI, rectal bleed with normal endoscopy, colonoscopy to be done as an outpatient. Patient had head CT done because of complaints of headache which showed no acute abnormality. She had a CT of her abdomen and pelvis without contrast which revealed no acute abnormality, she had CT of abdomen and pelvis done with contrast which showed a lesion on the anterior aspect of her left kidney suspicious for possible cyst or hypodense solid mass. Patient's noted to be status post cholecystectomy and has a abdominal wall defect with fat filled hernia that patient reported to me she's had for many years.  Ultrasound of her kidneys showed a small mass in the inferior poor the left kidney which is difficult to characterize but most likely represents one cyst. Patient also had a nuclear medicine gastric emptying study which revealed normal gastric emptying. They also documented she has had chronic back pain for many years. It was thought her headaches were due to obstructive sleep apnea and they have made arrangements for her to get a sleep study so she can get CPAP. Patient had endoscopy done that was normal. A relates she had a few episodes of bowel movements with bleeding bright red blood in the stool however they did not do  a colonoscopy in the hospital because it subsided without treatment.  Results for orders placed during the hospital encounter of 03/11/12  CBC WITH DIFFERENTIAL      Result Value Range   WBC 12.8 (*) 4.0 - 10.5 K/uL   RBC 4.34  3.87 - 5.11 MIL/uL   Hemoglobin 11.5 (*) 12.0 - 15.0 g/dL   HCT 40.9 (*) 81.1 - 91.4 %   MCV 81.3  78.0 - 100.0 fL   MCH 26.5  26.0 - 34.0 pg   MCHC 32.6  30.0 - 36.0 g/dL   RDW 78.2  95.6 - 21.3 %   Platelets 267  150 - 400 K/uL   Neutrophils Relative 60  43 - 77 %   Neutro Abs 7.7  1.7 - 7.7 K/uL   Lymphocytes Relative 33  12 - 46 %   Lymphs Abs 4.2 (*) 0.7 - 4.0 K/uL   Monocytes Relative 6  3 - 12 %   Monocytes Absolute 0.7  0.1 - 1.0 K/uL   Eosinophils Relative 2  0 - 5 %   Eosinophils Absolute 0.2  0.0 - 0.7 K/uL   Basophils Relative 0  0 - 1 %   Basophils Absolute 0.0  0.0 - 0.1 K/uL  COMPREHENSIVE METABOLIC PANEL      Result Value Range   Sodium 133 (*) 135 - 145 mEq/L   Potassium 4.1  3.5 - 5.1 mEq/L   Chloride 99  96 - 112 mEq/L   CO2 27  19 - 32 mEq/L   Glucose, Bld 298 (*) 70 - 99 mg/dL   BUN 12  6 - 23 mg/dL   Creatinine, Ser 0.86  0.50 - 1.10 mg/dL   Calcium 8.5  8.4 - 57.8 mg/dL   Total Protein 6.7  6.0 - 8.3 g/dL   Albumin 2.8 (*) 3.5 - 5.2 g/dL   AST 26  0 - 37 U/L   ALT 23  0 - 35 U/L   Alkaline Phosphatase 59  39 -  117 U/L   Total Bilirubin 0.2 (*) 0.3 - 1.2 mg/dL   GFR calc non Af Amer >90  >90 mL/min   GFR calc Af Amer >90  >90 mL/min  URINALYSIS, ROUTINE W REFLEX MICROSCOPIC      Result Value Range   Color, Urine YELLOW  YELLOW   APPearance CLEAR  CLEAR   Specific Gravity, Urine >1.030 (*) 1.005 - 1.030   pH 5.5  5.0 - 8.0   Glucose, UA >1000 (*) NEGATIVE mg/dL   Hgb urine dipstick LARGE (*) NEGATIVE   Bilirubin Urine NEGATIVE  NEGATIVE   Ketones, ur NEGATIVE  NEGATIVE mg/dL   Protein, ur NEGATIVE  NEGATIVE mg/dL   Urobilinogen, UA 0.2  0.0 - 1.0 mg/dL   Nitrite NEGATIVE  NEGATIVE   Leukocytes, UA NEGATIVE  NEGATIVE  URINE MICROSCOPIC-ADD ON      Result Value Range   Squamous Epithelial / LPF FEW (*) RARE   WBC, UA 0-2  <3 WBC/hpf   RBC / HPF 21-50  <3 RBC/hpf   Bacteria, UA FEW (*) RARE   Urine-Other FEW YEAST    OCCULT BLOOD, POC DEVICE      Result Value Range   Fecal Occult Bld POSITIVE (*) NEGATIVE   Laboratory interpretation all normal except hyperglycemia, concentrated urine, hematuria    1. Chronic back pain   2. Chronic abdominal pain   3. Rectal bleeding     New Prescriptions   CYCLOBENZAPRINE (FLEXERIL) 10 MG  TABLET    Take 1 tablet (10 mg total) by mouth 3 (three) times daily as needed for muscle spasms.   HYDROCODONE-ACETAMINOPHEN (NORCO/VICODIN) 5-325 MG PER TABLET    Take 1 or 2 po Q 6hrs for pain   ONDANSETRON (ZOFRAN) 4 MG TABLET    Take 1 tablet (4 mg total) by mouth every 6 (six) hours.    Plan discharge  Devoria Albe, MD, FACEP   MDM          Ward Givens, MD 03/11/12 0700

## 2012-03-11 NOTE — ED Notes (Signed)
Pt states she was released from [facility], with a cyst on kidney and rectal bleeding. Pt states rectal bleeding getting worse, started as small blood clots. Now is bright red and and a lot.

## 2012-03-11 NOTE — ED Notes (Addendum)
Stated rectal bleeding is worst with having BM, lower abdominal and lower back pain. Stated " feel like stomach is on fire". Stated using Zofran for nausea . Left foot swelling and bilateral arm get tingling. Has hx of panic attack, HA, anxiety , chronic back pain.

## 2012-03-13 ENCOUNTER — Ambulatory Visit: Payer: Self-pay | Admitting: Gastroenterology

## 2012-03-14 LAB — PATHOLOGY REPORT

## 2012-03-18 ENCOUNTER — Emergency Department (HOSPITAL_COMMUNITY)
Admission: EM | Admit: 2012-03-18 | Discharge: 2012-03-18 | Disposition: A | Discharge status: Home / Self Care | Payer: Medicaid Other | Attending: Emergency Medicine | Admitting: Emergency Medicine

## 2012-03-18 ENCOUNTER — Encounter (HOSPITAL_COMMUNITY): Payer: Self-pay

## 2012-03-18 DIAGNOSIS — R109 Unspecified abdominal pain: Secondary | ICD-10-CM

## 2012-03-18 DIAGNOSIS — Z3202 Encounter for pregnancy test, result negative: Secondary | ICD-10-CM | POA: Insufficient documentation

## 2012-03-18 DIAGNOSIS — Z79899 Other long term (current) drug therapy: Secondary | ICD-10-CM | POA: Insufficient documentation

## 2012-03-18 DIAGNOSIS — E119 Type 2 diabetes mellitus without complications: Secondary | ICD-10-CM | POA: Insufficient documentation

## 2012-03-18 DIAGNOSIS — J45909 Unspecified asthma, uncomplicated: Secondary | ICD-10-CM | POA: Insufficient documentation

## 2012-03-18 DIAGNOSIS — Z794 Long term (current) use of insulin: Secondary | ICD-10-CM | POA: Insufficient documentation

## 2012-03-18 LAB — RAPID URINE DRUG SCREEN, HOSP PERFORMED
Amphetamines: NOT DETECTED
Cocaine: NOT DETECTED
Opiates: NOT DETECTED

## 2012-03-18 LAB — URINALYSIS, ROUTINE W REFLEX MICROSCOPIC
Glucose, UA: >1000 mg/dL — AB
Hgb urine dipstick: NEGATIVE
Leukocytes, UA: NEGATIVE
Protein, ur: NEGATIVE mg/dL
Specific Gravity, Urine: 1.02 (ref 1.005–1.030)

## 2012-03-18 LAB — URINE MICROSCOPIC-ADD ON

## 2012-03-18 MED ORDER — PROMETHAZINE HCL 25 MG PO TABS: 25.0000 mg | ORAL_TABLET | Freq: Four times a day (QID) | ORAL | Status: DC | PRN

## 2012-03-18 MED ORDER — HYDROMORPHONE HCL PF 1 MG/ML IJ SOLN
1.0000 mg | Freq: Once | INTRAMUSCULAR | Status: AC
  Administered 2012-03-18: 1 mg via INTRAMUSCULAR
  Filled 2012-03-18: qty 1
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
## 2012-03-18 NOTE — ED Notes (Signed)
I have a cyst on my kidneys and my colon is inflamed. Was hospitalized at [facility] a couple of weeks ago. Had colonoscopy and endoscopy on March 5 th.

## 2012-03-18 NOTE — ED Notes (Signed)
Pt not very happy with her care here. States she wanted a pain script and didn't get it. Pt stating she will not come here again. Pt was complaining about [facility] in triage.

## 2012-04-04 ENCOUNTER — Encounter (HOSPITAL_COMMUNITY): Payer: Self-pay | Admitting: Emergency Medicine

## 2012-04-04 ENCOUNTER — Emergency Department (HOSPITAL_COMMUNITY)
Admission: EM | Admit: 2012-04-04 | Discharge: 2012-04-04 | Disposition: A | Discharge status: Home / Self Care | Payer: Medicaid Other | Attending: Emergency Medicine | Admitting: Emergency Medicine

## 2012-04-04 DIAGNOSIS — M549 Dorsalgia, unspecified: Secondary | ICD-10-CM

## 2012-04-04 DIAGNOSIS — Z79899 Other long term (current) drug therapy: Secondary | ICD-10-CM | POA: Insufficient documentation

## 2012-04-04 DIAGNOSIS — N390 Urinary tract infection, site not specified: Secondary | ICD-10-CM

## 2012-04-04 DIAGNOSIS — M545 Low back pain, unspecified: Secondary | ICD-10-CM | POA: Insufficient documentation

## 2012-04-04 DIAGNOSIS — Z3202 Encounter for pregnancy test, result negative: Secondary | ICD-10-CM | POA: Insufficient documentation

## 2012-04-04 DIAGNOSIS — Z8719 Personal history of other diseases of the digestive system: Secondary | ICD-10-CM | POA: Insufficient documentation

## 2012-04-04 DIAGNOSIS — R300 Dysuria: Secondary | ICD-10-CM | POA: Insufficient documentation

## 2012-04-04 DIAGNOSIS — Z794 Long term (current) use of insulin: Secondary | ICD-10-CM | POA: Insufficient documentation

## 2012-04-04 DIAGNOSIS — E119 Type 2 diabetes mellitus without complications: Secondary | ICD-10-CM | POA: Insufficient documentation

## 2012-04-04 DIAGNOSIS — J45909 Unspecified asthma, uncomplicated: Secondary | ICD-10-CM | POA: Insufficient documentation

## 2012-04-04 LAB — URINALYSIS, ROUTINE W REFLEX MICROSCOPIC
Bilirubin Urine: NEGATIVE
Ketones, ur: NEGATIVE mg/dL
Nitrite: NEGATIVE
Specific Gravity, Urine: 1.034 — ABNORMAL HIGH (ref 1.005–1.030)
Urobilinogen, UA: 0.2 mg/dL (ref 0.0–1.0)

## 2012-04-04 MED ORDER — SULFAMETHOXAZOLE-TRIMETHOPRIM 800-160 MG PO TABS: 1.0000 | ORAL_TABLET | Freq: Two times a day (BID) | ORAL | Status: DC

## 2012-04-04 MED ORDER — DICLOFENAC SODIUM 75 MG PO TBEC: 75.0000 mg | DELAYED_RELEASE_TABLET | Freq: Two times a day (BID) | ORAL | Status: DC

## 2012-04-04 NOTE — ED Provider Notes (Signed)
History     CSN: [id number]  Arrival date & time 04/04/12  4098   First MD Initiated Contact with Patient 04/04/12 0857      No chief complaint on file.   (Consider location/radiation/quality/duration/timing/severity/associated sxs/prior treatment) HPI Comments: She comes to the ER for evaluation of low back pain. Patient reports that her symptoms have been present for a couple of weeks. They have slowly become worse. Patient reports severe sharp pains in her lower back when she moves. She says she's having trouble getting out of bed in the morning because of the pain. He gets better when she sits on a heating pad and uses a back massager. Patient denies any injury. Patient has not had any urinary incontinence or retention, but she does report some burning with urination over the last couple of days. She has not had fever, nausea or vomiting.   Past Medical History  Diagnosis Date  . Diabetes mellitus without complication   . Asthma   . Hernia     Past Surgical History  Procedure Laterality Date  . Colonscopy      No family history on file.  History  Substance Use Topics  . Smoking status: Never Smoker   . Smokeless tobacco: Not on file  . Alcohol Use: No    OB History   Grav Para Term Preterm Abortions TAB SAB Ect Mult Living                  Review of Systems  Genitourinary: Positive for dysuria.  Musculoskeletal: Positive for back pain.  Neurological: Negative.   All other systems reviewed and are negative.    Allergies  Morphine and related; Darvocet; Toradol; and Ultram  Home Medications   Current Outpatient Rx  Name  Route  Sig  Dispense  Refill  . albuterol (PROVENTIL HFA;VENTOLIN HFA) 108 (90 BASE) MCG/ACT inhaler   Inhalation   Inhale 2 puffs into the lungs every 4 (four) hours as needed for wheezing.         . cyclobenzaprine (FLEXERIL) 10 MG tablet   Oral   Take 1 tablet (10 mg total) by mouth 3 (three) times daily as needed for muscle  spasms.   30 tablet   0   . HYDROcodone-acetaminophen (NORCO/VICODIN) 5-325 MG per tablet      Take 1 or 2 po Q 6hrs for pain   12 tablet   0   . insulin glargine (LANTUS) 100 UNIT/ML injection   Subcutaneous   Inject 50 Units into the skin daily.          Marland Kitchen lisinopril (PRINIVIL,ZESTRIL) 10 MG tablet   Oral   Take 10 mg by mouth daily.         . metFORMIN (GLUCOPHAGE) 500 MG tablet   Oral   Take 500 mg by mouth 2 (two) times daily with a meal.         . norethindrone-ethinyl estradiol 1/35 (NORTREL 1/35, 21,) tablet   Oral   Take 1 tablet by mouth daily.         Marland Kitchen nystatin cream (MYCOSTATIN)   Topical   Apply 1 application topically every 4 (four) hours as needed for dry skin (itching/pain).         . ondansetron (ZOFRAN) 4 MG tablet   Oral   Take 1 tablet (4 mg total) by mouth every 6 (six) hours.   12 tablet   0   . orphenadrine (NORFLEX) 100 MG tablet   Oral  Take 100 mg by mouth 2 (two) times daily.         Marland Kitchen oxyCODONE-acetaminophen (PERCOCET) 10-325 MG per tablet   Oral   Take 2 tablets by mouth every 4 (four) hours as needed for pain.         . promethazine (PHENERGAN) 25 MG tablet   Oral   Take 1 tablet (25 mg total) by mouth every 6 (six) hours as needed for nausea.   30 tablet   0     There were no vitals taken for this visit.  Physical Exam  Constitutional: She is oriented to person, place, and time. She appears well-developed and well-nourished. No distress.  HENT:  Head: Normocephalic and atraumatic.  Right Ear: Hearing normal.  Nose: Nose normal.  Mouth/Throat: Oropharynx is clear and moist and mucous membranes are normal.  Eyes: Conjunctivae and EOM are normal. Pupils are equal, round, and reactive to light.  Neck: Normal range of motion. Neck supple.  Cardiovascular: Normal rate, regular rhythm, S1 normal and S2 normal.  Exam reveals no gallop and no friction rub.   No murmur heard. Pulmonary/Chest: Effort normal and  breath sounds normal. No respiratory distress. She exhibits no tenderness.  Abdominal: Soft. Normal appearance and bowel sounds are normal. There is no hepatosplenomegaly. There is no tenderness. There is no rebound, no guarding, no tenderness at McBurney's point and negative Murphy's sign. No hernia.  Musculoskeletal: Normal range of motion.       Arms: Diffuse soft tissue tenderness in the low back  Neurological: She is alert and oriented to person, place, and time. She has normal strength. No cranial nerve deficit or sensory deficit. Coordination normal. GCS eye subscore is 4. GCS verbal subscore is 5. GCS motor subscore is 6.  Skin: Skin is warm, dry and intact. No rash noted. No cyanosis.  Psychiatric: She has a normal mood and affect. Her speech is normal and behavior is normal. Thought content normal.    ED Course  Procedures (including critical care time)  Labs Reviewed - No data to display No results found.   Diagnosis: 1. Back pain 2. UTI    MDM  Patient comes to the ER with complaints of back pain. Pain is been present for several weeks. She reports that she has had some dysuria now and is concerned that she has a kidney infection. No fever, nausea or vomiting. Patient has diffuse lower back pain and this seems very consistent with musculoskeletal pain. She has difficulty getting out of bed because of the pain it hurts when she bends and twists. She has a normal neurologic examination. Patient has a concerning a list of allergies including Toradol, Ultram. Reviewing her records reveals that she became quite belligerent last time she did not get any narcotic prescription. This is therefore concerning for drug-seeking behavior. Will avoid further narcotic analgesia. Patient taking Flexeril now, reports that it makes her too sleepy.  Patient's urinalysis was equivocal. She does have symptoms of dysuria. We'll treat empirically, as patient is diabetic and at risk for worsening  infection if awaiting culture results.       Gilda Crease, MD 04/04/12 1014

## 2012-04-04 NOTE — ED Notes (Signed)
Pt presenting to ed with c/o back pain x weeks pt states she also has burning with urination. Pt denies hematuria at this time

## 2012-04-07 LAB — URINE CULTURE: Colony Count: 45000

## 2012-04-08 ENCOUNTER — Telehealth (HOSPITAL_COMMUNITY): Payer: Self-pay | Admitting: Emergency Medicine

## 2012-04-08 NOTE — ED Notes (Signed)
+  Urine. Patient given Septra DS. No sensitivities listed. Chart sent to EDP office for review.

## 2012-04-08 NOTE — ED Notes (Signed)
Patient has +Urine culture. Checking to see if appropriately treated. °

## 2012-04-10 ENCOUNTER — Telehealth (HOSPITAL_COMMUNITY): Payer: Self-pay | Admitting: Emergency Medicine

## 2012-04-10 NOTE — ED Notes (Signed)
Chart reviewed by H. Anne Shutter PA.  "Contaminated urine sample.  No change in antibiotic needed."  Chart appended.

## 2012-05-03 ENCOUNTER — Emergency Department: Payer: Self-pay | Admitting: Emergency Medicine

## 2012-05-03 LAB — URINALYSIS, COMPLETE
Bilirubin,UR: NEGATIVE
Glucose,UR: >=500 mg/dL (ref 0–75)
Ketone: NEGATIVE
Nitrite: NEGATIVE
Ph: 5 (ref 4.5–8.0)
Protein: NEGATIVE
Specific Gravity: 1.035 (ref 1.003–1.030)

## 2012-05-03 LAB — COMPREHENSIVE METABOLIC PANEL
Albumin: 2.8 g/dL — ABNORMAL LOW (ref 3.4–5.0)
Alkaline Phosphatase: 74 U/L (ref 50–136)
Anion Gap: 6 — ABNORMAL LOW (ref 7–16)
BUN: 10 mg/dL (ref 7–18)
Calcium, Total: 8.2 mg/dL — ABNORMAL LOW (ref 8.5–10.1)
Chloride: 100 mmol/L (ref 98–107)
Creatinine: 0.94 mg/dL (ref 0.60–1.30)
EGFR (Non-African Amer.): >60
Glucose: 373 mg/dL — ABNORMAL HIGH (ref 65–99)
SGOT(AST): 69 U/L — ABNORMAL HIGH (ref 15–37)
SGPT (ALT): 61 U/L (ref 12–78)

## 2012-05-03 LAB — CBC
HGB: 12.3 g/dL (ref 12.0–16.0)
MCHC: 31.9 g/dL — ABNORMAL LOW (ref 32.0–36.0)
Platelet: 213 10*3/uL (ref 150–440)
RDW: 14.9 % — ABNORMAL HIGH (ref 11.5–14.5)
WBC: 8.7 10*3/uL (ref 3.6–11.0)

## 2012-05-05 LAB — URINE CULTURE

## 2012-05-20 ENCOUNTER — Emergency Department: Payer: Self-pay | Admitting: Emergency Medicine

## 2012-05-20 ENCOUNTER — Encounter (HOSPITAL_COMMUNITY): Payer: Self-pay | Admitting: Family Medicine

## 2012-05-20 ENCOUNTER — Emergency Department (HOSPITAL_COMMUNITY)
Admission: EM | Admit: 2012-05-20 | Discharge: 2012-05-20 | Disposition: A | Discharge status: Home / Self Care | Payer: Medicaid Other | Attending: Emergency Medicine | Admitting: Emergency Medicine

## 2012-05-20 ENCOUNTER — Emergency Department (HOSPITAL_COMMUNITY): Payer: Medicaid Other

## 2012-05-20 DIAGNOSIS — M545 Low back pain, unspecified: Secondary | ICD-10-CM | POA: Insufficient documentation

## 2012-05-20 DIAGNOSIS — R739 Hyperglycemia, unspecified: Secondary | ICD-10-CM

## 2012-05-20 DIAGNOSIS — E669 Obesity, unspecified: Secondary | ICD-10-CM | POA: Insufficient documentation

## 2012-05-20 DIAGNOSIS — J45909 Unspecified asthma, uncomplicated: Secondary | ICD-10-CM | POA: Insufficient documentation

## 2012-05-20 DIAGNOSIS — Z8719 Personal history of other diseases of the digestive system: Secondary | ICD-10-CM | POA: Insufficient documentation

## 2012-05-20 DIAGNOSIS — G8929 Other chronic pain: Secondary | ICD-10-CM | POA: Insufficient documentation

## 2012-05-20 DIAGNOSIS — R079 Chest pain, unspecified: Secondary | ICD-10-CM | POA: Insufficient documentation

## 2012-05-20 DIAGNOSIS — E1169 Type 2 diabetes mellitus with other specified complication: Secondary | ICD-10-CM | POA: Insufficient documentation

## 2012-05-20 DIAGNOSIS — R1013 Epigastric pain: Secondary | ICD-10-CM | POA: Insufficient documentation

## 2012-05-20 DIAGNOSIS — Z79899 Other long term (current) drug therapy: Secondary | ICD-10-CM | POA: Insufficient documentation

## 2012-05-20 HISTORY — DX: Renal tubulo-interstitial disease, unspecified: N15.9

## 2012-05-20 LAB — URINALYSIS, COMPLETE
Ketone: NEGATIVE
Nitrite: NEGATIVE
Protein: NEGATIVE
Specific Gravity: 1.043 (ref 1.003–1.030)
WBC UR: 3 /HPF (ref 0–5)

## 2012-05-20 LAB — COMPREHENSIVE METABOLIC PANEL
AST: 46 U/L — ABNORMAL HIGH (ref 0–37)
Albumin: 3.1 g/dL — ABNORMAL LOW (ref 3.5–5.2)
Alkaline Phosphatase: 75 U/L (ref 39–117)
Alkaline Phosphatase: 71 U/L (ref 50–136)
BUN: 11 mg/dL (ref 6–23)
CO2: 27 mEq/L (ref 19–32)
Calcium, Total: 7.9 mg/dL — ABNORMAL LOW (ref 8.5–10.1)
Chloride: 95 mEq/L — ABNORMAL LOW (ref 96–112)
Chloride: 99 mmol/L (ref 98–107)
Creatinine, Ser: 0.66 mg/dL (ref 0.50–1.10)
EGFR (Non-African Amer.): >60
GFR calc non Af Amer: >90 mL/min (ref >90)
Osmolality: 278 (ref 275–301)
Potassium: 4.1 mEq/L (ref 3.5–5.1)
SGOT(AST): 31 U/L (ref 15–37)
Total Bilirubin: 0.3 mg/dL (ref 0.3–1.2)
Total Protein: 6.6 g/dL (ref 6.4–8.2)

## 2012-05-20 LAB — CBC
HCT: 39.4 % (ref 36.0–46.0)
MCV: 80.1 fL (ref 78.0–100.0)
Platelets: 256 10*3/uL (ref 150–400)
RBC: 4.92 MIL/uL (ref 3.87–5.11)
RDW: 14.4 % (ref 11.5–15.5)
WBC: 11.8 10*3/uL — ABNORMAL HIGH (ref 4.0–10.5)

## 2012-05-20 LAB — CBC WITH DIFFERENTIAL/PLATELET
Basophil #: 0.1 10*3/uL (ref 0.0–0.1)
Eosinophil #: 0.2 10*3/uL (ref 0.0–0.7)
Eosinophil %: 1.7 %
HCT: 36 % (ref 35.0–47.0)
Lymphocyte #: 4.2 10*3/uL — ABNORMAL HIGH (ref 1.0–3.6)
MCH: 26.3 pg (ref 26.0–34.0)
Monocyte #: 0.6 x10 3/mm (ref 0.2–0.9)
RDW: 15.4 % — ABNORMAL HIGH (ref 11.5–14.5)
WBC: 11.3 10*3/uL — ABNORMAL HIGH (ref 3.6–11.0)

## 2012-05-20 MED ORDER — PANTOPRAZOLE SODIUM 40 MG IV SOLR
40.0000 mg | Freq: Once | INTRAVENOUS | Status: AC
  Administered 2012-05-20: 40 mg via INTRAVENOUS
  Filled 2012-05-20: qty 40

## 2012-05-20 MED ORDER — INSULIN ASPART 100 UNIT/ML ~~LOC~~ SOLN
10.0000 [IU] | Freq: Once | SUBCUTANEOUS | Status: AC
  Administered 2012-05-20: 10 [IU] via SUBCUTANEOUS
  Filled 2012-05-20: qty 1

## 2012-05-20 MED ORDER — SODIUM CHLORIDE 0.9 % IV BOLUS (SEPSIS)
1000.0000 mL | Freq: Once | INTRAVENOUS | Status: AC
  Administered 2012-05-20: 1000 mL via INTRAVENOUS

## 2012-05-20 MED ORDER — FAMOTIDINE 20 MG PO TABS: 20.0000 mg | ORAL_TABLET | Freq: Two times a day (BID) | ORAL | Status: DC

## 2012-05-20 MED ORDER — CYCLOBENZAPRINE HCL 10 MG PO TABS
5.0000 mg | ORAL_TABLET | Freq: Once | ORAL | Status: DC
  Filled 2012-05-20: qty 2

## 2012-05-20 MED ORDER — NITROGLYCERIN 0.4 MG SL SUBL: 0.4000 mg | SUBLINGUAL_TABLET | SUBLINGUAL | Status: DC | PRN

## 2012-05-20 MED ORDER — HYDROMORPHONE HCL PF 1 MG/ML IJ SOLN
1.0000 mg | Freq: Once | INTRAMUSCULAR | Status: AC
  Administered 2012-05-20: 1 mg via INTRAVENOUS
  Filled 2012-05-20: qty 1

## 2012-05-20 NOTE — ED Notes (Signed)
Dr. Opitz at bedside. 

## 2012-05-20 NOTE — ED Notes (Addendum)
Pt states does not want Flexeril. Pt states "I know what's wrong with me, it's my kidneys, I have a chronic kidney infection." Dr. Dierdre Highman aware. Ice applied to pt's back

## 2012-05-20 NOTE — ED Provider Notes (Signed)
History     CSN: [id number]  Arrival date & time 05/20/12  0144   First MD Initiated Contact with Patient 05/20/12 0156      Chief Complaint  Patient presents with  . Chest Pain  . Abdominal Pain    (Consider location/radiation/quality/duration/timing/severity/associated sxs/prior treatment) HPI History provided by patient. Epigastric pain radiates to lower substernal area with onset tonight. Patient also complaining of chronic low back pain, round Percocet 3 days ago. She is followed by family practice in [place]. She is requesting IV Dilaudid. No shortness of breath. No radiation of abdominal pain or back pain. No associated weakness or numbness. No fevers or chills. Abdominal pain is sharp and now she is in emergency department is feeling much better. No trauma. Has had similar epigastric pain and is requesting a small white pill which helped her previously in the past. No nausea or vomiting. Past Medical History  Diagnosis Date  . Diabetes mellitus without complication   . Asthma   . Hernia     Past Surgical History  Procedure Laterality Date  . Colonscopy      No family history on file.  History  Substance Use Topics  . Smoking status: Never Smoker   . Smokeless tobacco: Not on file  . Alcohol Use: No    OB History   Grav Para Term Preterm Abortions TAB SAB Ect Mult Living                  Review of Systems  Constitutional: Negative for fever and chills.  HENT: Negative for neck pain and neck stiffness.   Eyes: Negative for pain.  Respiratory: Negative for shortness of breath.   Cardiovascular: Positive for chest pain.  Gastrointestinal: Positive for abdominal pain.  Genitourinary: Negative for dysuria.  Musculoskeletal: Positive for back pain.  Skin: Negative for rash.  Neurological: Negative for headaches.  All other systems reviewed and are negative.    Allergies  Morphine and related; Darvocet; Toradol; and Ultram  Home Medications    Current Outpatient Rx  Name  Route  Sig  Dispense  Refill  . albuterol (PROVENTIL HFA;VENTOLIN HFA) 108 (90 BASE) MCG/ACT inhaler   Inhalation   Inhale 2 puffs into the lungs every 4 (four) hours as needed for wheezing.         . Aspirin-Salicylamide-Caffeine (BC HEADACHE POWDER PO)   Oral   Take 1 packet by mouth 2 (two) times daily as needed (pain).         Marland Kitchen diclofenac (VOLTAREN) 75 MG EC tablet   Oral   Take 1 tablet (75 mg total) by mouth 2 (two) times daily.   20 tablet   0   . ibuprofen (ADVIL,MOTRIN) 800 MG tablet   Oral   Take 800 mg by mouth every 8 (eight) hours as needed for pain.         Marland Kitchen insulin glargine (LANTUS) 100 UNIT/ML injection   Subcutaneous   Inject 50 Units into the skin every morning.          Marland Kitchen lisinopril (PRINIVIL,ZESTRIL) 10 MG tablet   Oral   Take 10 mg by mouth every morning.          . norethindrone-ethinyl estradiol 1/35 (NORTREL 1/35, 21,) tablet   Oral   Take 1 tablet by mouth daily.         Marland Kitchen nystatin cream (MYCOSTATIN)   Topical   Apply 1 application topically every 4 (four) hours as needed for dry  skin (itching/pain).         Marland Kitchen omeprazole (PRILOSEC) 20 MG capsule   Oral   Take 20 mg by mouth daily.         . ondansetron (ZOFRAN-ODT) 4 MG disintegrating tablet   Oral   Take 4 mg by mouth every 8 (eight) hours as needed for nausea.         Marland Kitchen oxyCODONE-acetaminophen (PERCOCET) 10-325 MG per tablet   Oral   Take 2 tablets by mouth every 4 (four) hours as needed for pain.         Marland Kitchen sulfamethoxazole-trimethoprim (SEPTRA DS) 800-160 MG per tablet   Oral   Take 1 tablet by mouth every 12 (twelve) hours.   10 tablet   0     Temp(Src) 99.1 F (37.3 C)  SpO2 100%  Physical Exam  Constitutional: She is oriented to person, place, and time. She appears well-developed and well-nourished.  HENT:  Head: Normocephalic and atraumatic.  Eyes: Conjunctivae and EOM are normal. Pupils are equal, round, and reactive  to light.  Neck: Trachea normal. Neck supple. No thyromegaly present.  Cardiovascular: Normal rate, regular rhythm, S1 normal, S2 normal and normal pulses.     No systolic murmur is present   No diastolic murmur is present  Pulses:      Radial pulses are 2+ on the right side, and 2+ on the left side.  Pulmonary/Chest: Effort normal and breath sounds normal. She has no wheezes. She has no rhonchi. She has no rales. She exhibits no tenderness.  Abdominal: Soft. Normal appearance and bowel sounds are normal. There is no tenderness. There is no CVA tenderness and negative Murphy's sign.  Obese  Musculoskeletal:  Mild lower lumbar tenderness without midline deformity. No lower extremity deficits with equal strengths, sensorium to light touch and DTRs. Calves nontender, no cords or erythema, negative Homans sign  Neurological: She is alert and oriented to person, place, and time. She has normal strength. No cranial nerve deficit or sensory deficit. GCS eye subscore is 4. GCS verbal subscore is 5. GCS motor subscore is 6.  Skin: Skin is warm and dry. No rash noted. She is not diaphoretic.  Psychiatric: Her speech is normal.  Cooperative and appropriate    ED Course  Procedures (including critical care time)  Results for orders placed during the hospital encounter of 05/20/12  CBC      Result Value Range   WBC 11.8 (*) 4.0 - 10.5 K/uL   RBC 4.92  3.87 - 5.11 MIL/uL   Hemoglobin 12.9  12.0 - 15.0 g/dL   HCT 29.5  62.1 - 30.8 %   MCV 80.1  78.0 - 100.0 fL   MCH 26.2  26.0 - 34.0 pg   MCHC 32.7  30.0 - 36.0 g/dL   RDW 65.7  84.6 - 96.2 %   Platelets 256  150 - 400 K/uL  COMPREHENSIVE METABOLIC PANEL      Result Value Range   Sodium 131 (*) 135 - 145 mEq/L   Potassium 4.1  3.5 - 5.1 mEq/L   Chloride 95 (*) 96 - 112 mEq/L   CO2 27  19 - 32 mEq/L   Glucose, Bld 436 (*) 70 - 99 mg/dL   BUN 11  6 - 23 mg/dL   Creatinine, Ser 9.52  0.50 - 1.10 mg/dL   Calcium 9.1  8.4 - 84.1 mg/dL   Total  Protein 7.1  6.0 - 8.3 g/dL   Albumin 3.1 (*)  3.5 - 5.2 g/dL   AST 46 (*) 0 - 37 U/L   ALT 56 (*) 0 - 35 U/L   Alkaline Phosphatase 75  39 - 117 U/L   Total Bilirubin 0.3  0.3 - 1.2 mg/dL   GFR calc non Af Amer >90  >90 mL/min   GFR calc Af Amer >90  >90 mL/min  LIPASE, BLOOD      Result Value Range   Lipase 41  11 - 59 U/L  GLUCOSE, CAPILLARY      Result Value Range   Glucose-Capillary 378 (*) 70 - 99 mg/dL  POCT I-STAT TROPONIN I      Result Value Range   Troponin i, poc 0.00  0.00 - 0.08 ng/mL   Comment 3            Dg Chest Portable 1 View  05/20/2012  *RADIOLOGY REPORT*  Clinical Data: Chest pain  PORTABLE CHEST - 1 VIEW  Comparison: 03/13/2007  Findings: Hypoaeration.  No confluent airspace opacity.  No pleural effusion or pneumothorax.  Heart size and mediastinal contours within normal range for inspiration.  No acute osseous finding.  IMPRESSION: Hypoaeration without focal consolidation.   Original Report Authenticated By: Jearld Lesch, M.D.      Date: 05/20/2012  Rate: 105  Rhythm: sinus tachycardia  QRS Axis: normal  Intervals: normal  ST/T Wave abnormalities: nonspecific ST changes  Conduction Disutrbances:none  Narrative Interpretation:   Old EKG Reviewed: none available  IV fluids and insulin provided for hyperglycemia. IV Protonix provided for epigastric discomfort and on recheck pain resolved. Patient still having severe low back pain and IV Dilaudid provided. Condition improved and plan close outpatient followup. Holding narcotics given chronic pain complaints. Strict return precautions verbalized as understood. Prescription for Pepcid provided by request  MDM  Epigastric pain most consistent with gastritis, doubt ACS. Patient also with chronic low back pain. She was evaluated with EKG, imaging and labs all reviewed as above. Condition improved with IV fluids and narcotics and insulin. Vital signs nursing notes reviewed and considered        Sunnie Nielsen, MD 05/21/12 [phone]

## 2012-05-20 NOTE — ED Notes (Signed)
Pt comfortable with discharge and follow up instructions. Pt ambulatory without issue. 1 Prescription given

## 2012-05-20 NOTE — ED Notes (Signed)
Pt here by [facility]. Per EMS pt ran out of Percocet x3 days ago, since then pt c/o epigastric and kidney pain. Pt reports epigastric pain is pressure made worse on inspiration and palpation radiating to left breast. CBG 399 PTA

## 2012-06-03 ENCOUNTER — Emergency Department: Payer: Self-pay | Admitting: Emergency Medicine

## 2012-06-30 ENCOUNTER — Emergency Department: Payer: Self-pay | Admitting: Emergency Medicine

## 2012-08-11 ENCOUNTER — Emergency Department: Payer: Self-pay | Admitting: Emergency Medicine

## 2012-08-11 LAB — URINALYSIS, COMPLETE
Bilirubin,UR: NEGATIVE
Nitrite: NEGATIVE
Ph: 6 (ref 4.5–8.0)
Specific Gravity: 1.03 (ref 1.003–1.030)
Squamous Epithelial: 5
WBC UR: 5 /HPF (ref 0–5)

## 2012-09-01 ENCOUNTER — Emergency Department: Payer: Self-pay | Admitting: Emergency Medicine

## 2012-09-01 LAB — CBC
HCT: 38.8 % (ref 35.0–47.0)
HGB: 13 g/dL (ref 12.0–16.0)
MCH: 27.3 pg (ref 26.0–34.0)
MCHC: 33.5 g/dL (ref 32.0–36.0)
Platelet: 216 10*3/uL (ref 150–440)
RDW: 15.2 % — ABNORMAL HIGH (ref 11.5–14.5)

## 2012-09-01 LAB — COMPREHENSIVE METABOLIC PANEL
Alkaline Phosphatase: 90 U/L (ref 50–136)
Anion Gap: 7 (ref 7–16)
BUN: 10 mg/dL (ref 7–18)
Bilirubin,Total: 0.2 mg/dL (ref 0.2–1.0)
Calcium, Total: 8.6 mg/dL (ref 8.5–10.1)
Creatinine: 0.88 mg/dL (ref 0.60–1.30)
EGFR (Non-African Amer.): >60
Osmolality: 280 (ref 275–301)
Potassium: 4.1 mmol/L (ref 3.5–5.1)
SGOT(AST): 62 U/L — ABNORMAL HIGH (ref 15–37)
Sodium: 135 mmol/L — ABNORMAL LOW (ref 136–145)
Total Protein: 6.9 g/dL (ref 6.4–8.2)

## 2012-09-01 LAB — URINALYSIS, COMPLETE
Bilirubin,UR: NEGATIVE
Glucose,UR: 100 mg/dL (ref 0–75)
Ketone: NEGATIVE
Nitrite: NEGATIVE
Protein: NEGATIVE
RBC,UR: 3 /HPF (ref 0–5)
Specific Gravity: 1.005 (ref 1.003–1.030)
Squamous Epithelial: 12
WBC UR: 3 /HPF (ref 0–5)

## 2012-09-01 LAB — LIPASE, BLOOD: Lipase: 114 U/L (ref 73–393)

## 2012-09-02 ENCOUNTER — Emergency Department: Payer: Self-pay | Admitting: Emergency Medicine

## 2012-10-06 ENCOUNTER — Encounter (HOSPITAL_COMMUNITY): Payer: Self-pay | Admitting: *Deleted

## 2012-10-06 ENCOUNTER — Emergency Department (HOSPITAL_COMMUNITY)
Admission: EM | Admit: 2012-10-06 | Discharge: 2012-10-06 | Disposition: A | Discharge status: Home / Self Care | Payer: Medicaid Other | Attending: Emergency Medicine | Admitting: Emergency Medicine

## 2012-10-06 DIAGNOSIS — R109 Unspecified abdominal pain: Secondary | ICD-10-CM

## 2012-10-06 DIAGNOSIS — R55 Syncope and collapse: Secondary | ICD-10-CM | POA: Insufficient documentation

## 2012-10-06 DIAGNOSIS — Z794 Long term (current) use of insulin: Secondary | ICD-10-CM | POA: Insufficient documentation

## 2012-10-06 DIAGNOSIS — R112 Nausea with vomiting, unspecified: Secondary | ICD-10-CM | POA: Insufficient documentation

## 2012-10-06 DIAGNOSIS — R1032 Left lower quadrant pain: Secondary | ICD-10-CM | POA: Insufficient documentation

## 2012-10-06 DIAGNOSIS — Z87448 Personal history of other diseases of urinary system: Secondary | ICD-10-CM | POA: Insufficient documentation

## 2012-10-06 DIAGNOSIS — E119 Type 2 diabetes mellitus without complications: Secondary | ICD-10-CM | POA: Insufficient documentation

## 2012-10-06 DIAGNOSIS — Z3202 Encounter for pregnancy test, result negative: Secondary | ICD-10-CM | POA: Insufficient documentation

## 2012-10-06 DIAGNOSIS — Z79899 Other long term (current) drug therapy: Secondary | ICD-10-CM | POA: Insufficient documentation

## 2012-10-06 DIAGNOSIS — K439 Ventral hernia without obstruction or gangrene: Secondary | ICD-10-CM | POA: Insufficient documentation

## 2012-10-06 DIAGNOSIS — J45909 Unspecified asthma, uncomplicated: Secondary | ICD-10-CM | POA: Insufficient documentation

## 2012-10-06 DIAGNOSIS — R1033 Periumbilical pain: Secondary | ICD-10-CM | POA: Insufficient documentation

## 2012-10-06 DIAGNOSIS — R5381 Other malaise: Secondary | ICD-10-CM | POA: Insufficient documentation

## 2012-10-06 LAB — URINALYSIS, ROUTINE W REFLEX MICROSCOPIC
Hgb urine dipstick: NEGATIVE
Nitrite: NEGATIVE
Protein, ur: NEGATIVE mg/dL
Urobilinogen, UA: 0.2 mg/dL (ref 0.0–1.0)

## 2012-10-06 LAB — CBC WITH DIFFERENTIAL/PLATELET
Eosinophils Absolute: 0.2 10*3/uL (ref 0.0–0.7)
Eosinophils Relative: 2 % (ref 0–5)
HCT: 38 % (ref 36.0–46.0)
Lymphocytes Relative: 32 % (ref 12–46)
Lymphs Abs: 3.4 10*3/uL (ref 0.7–4.0)
MCH: 27.7 pg (ref 26.0–34.0)
MCV: 83.5 fL (ref 78.0–100.0)
Monocytes Absolute: 0.6 10*3/uL (ref 0.1–1.0)
Monocytes Relative: 5 % (ref 3–12)
RBC: 4.55 MIL/uL (ref 3.87–5.11)
WBC: 10.8 10*3/uL — ABNORMAL HIGH (ref 4.0–10.5)

## 2012-10-06 LAB — COMPREHENSIVE METABOLIC PANEL
ALT: 37 U/L — ABNORMAL HIGH (ref 0–35)
BUN: 12 mg/dL (ref 6–23)
CO2: 25 mEq/L (ref 19–32)
Calcium: 8.5 mg/dL (ref 8.4–10.5)
Creatinine, Ser: 0.55 mg/dL (ref 0.50–1.10)
GFR calc Af Amer: >90 mL/min (ref >90)
GFR calc non Af Amer: >90 mL/min (ref >90)
Glucose, Bld: 292 mg/dL — ABNORMAL HIGH (ref 70–99)

## 2012-10-06 LAB — POCT I-STAT TROPONIN I: Troponin i, poc: 0 ng/mL (ref 0.00–0.08)

## 2012-10-06 LAB — CG4 I-STAT (LACTIC ACID): Lactic Acid, Venous: 1.62 mmol/L (ref 0.5–2.2)

## 2012-10-06 LAB — URINE MICROSCOPIC-ADD ON

## 2012-10-06 LAB — LIPASE, BLOOD: Lipase: 119 U/L — ABNORMAL HIGH (ref 11–59)

## 2012-10-06 LAB — PREGNANCY, URINE: Preg Test, Ur: NEGATIVE

## 2012-10-06 MED ORDER — HYDROMORPHONE HCL PF 1 MG/ML IJ SOLN
1.0000 mg | Freq: Once | INTRAMUSCULAR | Status: AC
  Administered 2012-10-06: 1 mg via INTRAVENOUS
  Filled 2012-10-06: qty 1

## 2012-10-06 MED ORDER — METOCLOPRAMIDE HCL 5 MG/ML IJ SOLN
10.0000 mg | Freq: Once | INTRAMUSCULAR | Status: AC
  Administered 2012-10-06: 10 mg via INTRAVENOUS
  Filled 2012-10-06: qty 2

## 2012-10-06 MED ORDER — METOCLOPRAMIDE HCL 10 MG PO TABS: 10.0000 mg | ORAL_TABLET | Freq: Four times a day (QID) | ORAL | Status: DC | PRN

## 2012-10-06 MED ORDER — HYDROCODONE-ACETAMINOPHEN 5-325 MG PO TABS: 2.0000 | ORAL_TABLET | ORAL | Status: DC | PRN

## 2012-10-06 MED ORDER — SODIUM CHLORIDE 0.9 % IV BOLUS (SEPSIS)
500.0000 mL | Freq: Once | INTRAVENOUS | Status: AC
  Administered 2012-10-06: 500 mL via INTRAVENOUS

## 2012-10-06 NOTE — ED Provider Notes (Signed)
CSN: [id number]     Arrival date & time 10/06/12  0143 History   First MD Initiated Contact with Patient 10/06/12 0244     Chief Complaint  Patient presents with  . Loss of Consciousness   (Consider location/radiation/quality/duration/timing/severity/associated sxs/prior Treatment) HPI Comments: 37 yo female with recurrent abd pain and hernia difficulties for 9 yrs.  Pt has had multiple previous surgeries and recent CT scan at outside hospital showed fat protrusion into mesh per pt.  The past 3 wks the pain has worsened, now constant LLQ and periumbilical.  Non radiating.  Similar to previous.  Pt frustrated with the pain.  She did pass out once with the pain, had epig pain at that time.  No sob.  No recent surgeries.  Pt has seen 3-4 surgeons who all tell her they will not do repeat surgery on her.  She has pain meds at home oxy but not helping.  She is frustrated.   Patient is a 37 y.o. female presenting with syncope. The history is provided by the patient.  Loss of Consciousness Associated symptoms: nausea and vomiting   Associated symptoms: no chest pain, no fever, no headaches and no shortness of breath     Past Medical History  Diagnosis Date  . Diabetes mellitus without complication   . Asthma   . Hernia   . Kidney infection    Past Surgical History  Procedure Laterality Date  . Colonscopy     No family history on file. History  Substance Use Topics  . Smoking status: Never Smoker   . Smokeless tobacco: Not on file  . Alcohol Use: No   OB History   Grav Para Term Preterm Abortions TAB SAB Ect Mult Living                 Review of Systems  Constitutional: Positive for fatigue. Negative for fever and chills.  HENT: Negative for neck pain and neck stiffness.   Eyes: Negative for visual disturbance.  Respiratory: Negative for shortness of breath.   Cardiovascular: Positive for syncope. Negative for chest pain.  Gastrointestinal: Positive for nausea, vomiting and  abdominal pain. Negative for diarrhea and anal bleeding.  Genitourinary: Negative for dysuria and flank pain.  Musculoskeletal: Negative for back pain.  Skin: Negative for rash.  Neurological: Negative for light-headedness and headaches.    Allergies  Morphine and related; Darvocet; Toradol; and Ultram  Home Medications   Current Outpatient Rx  Name  Route  Sig  Dispense  Refill  . albuterol (PROVENTIL HFA;VENTOLIN HFA) 108 (90 BASE) MCG/ACT inhaler   Inhalation   Inhale 2 puffs into the lungs every 4 (four) hours as needed for wheezing.         . famotidine (PEPCID) 20 MG tablet   Oral   Take 1 tablet (20 mg total) by mouth 2 (two) times daily.   30 tablet   0   . insulin glargine (LANTUS) 100 UNIT/ML injection   Subcutaneous   Inject 70 Units into the skin every morning.          Marland Kitchen lisinopril (PRINIVIL,ZESTRIL) 10 MG tablet   Oral   Take 10 mg by mouth every morning.          . norethindrone-ethinyl estradiol 1/35 (NORTREL 1/35, 21,) tablet   Oral   Take 1 tablet by mouth daily.         Marland Kitchen nystatin cream (MYCOSTATIN)   Topical   Apply 1 application topically every 4 (four)  hours as needed for dry skin (itching/pain).         Marland Kitchen omeprazole (PRILOSEC) 20 MG capsule   Oral   Take 20 mg by mouth daily.         . ondansetron (ZOFRAN-ODT) 4 MG disintegrating tablet   Oral   Take 4 mg by mouth every 8 (eight) hours as needed for nausea.         Marland Kitchen oxyCODONE-acetaminophen (PERCOCET) 10-325 MG per tablet   Oral   Take 2 tablets by mouth every 4 (four) hours as needed for pain.          BP 100/64  Pulse 84  Temp(Src) 98.6 F (37 C) (Oral)  Resp 16  SpO2 99% Physical Exam  Nursing note and vitals reviewed. Constitutional: She is oriented to person, place, and time. She appears well-developed and well-nourished.  HENT:  Head: Normocephalic and atraumatic.  Eyes: Conjunctivae are normal. Right eye exhibits no discharge. Left eye exhibits no discharge.   Neck: Normal range of motion. Neck supple. No tracheal deviation present.  Cardiovascular: Normal rate and regular rhythm.   Pulmonary/Chest: Effort normal and breath sounds normal.  Abdominal: Soft. She exhibits no distension. There is tenderness (periumbilical and LLQ, soft, mild protrusion of hernia/ reduceable). There is no guarding.  Musculoskeletal: She exhibits no edema.  Neurological: She is alert and oriented to person, place, and time.  Skin: Skin is warm. No rash noted.  Psychiatric: She has a normal mood and affect.    ED Course  Procedures (including critical care time) Labs Review Labs Reviewed  CBC WITH DIFFERENTIAL - Abnormal; Notable for the following:    WBC 10.8 (*)    All other components within normal limits  COMPREHENSIVE METABOLIC PANEL - Abnormal; Notable for the following:    Sodium 131 (*)    Glucose, Bld 292 (*)    Albumin 2.7 (*)    ALT 37 (*)    Total Bilirubin 0.2 (*)    All other components within normal limits  LIPASE, BLOOD - Abnormal; Notable for the following:    Lipase 119 (*)    All other components within normal limits  URINALYSIS, ROUTINE W REFLEX MICROSCOPIC - Abnormal; Notable for the following:    Specific Gravity, Urine 1.033 (*)    Glucose, UA >1000 (*)    All other components within normal limits  URINE MICROSCOPIC-ADD ON - Abnormal; Notable for the following:    Squamous Epithelial / LPF FEW (*)    Bacteria, UA FEW (*)    All other components within normal limits  URINE CULTURE  PREGNANCY, URINE  CBC WITH DIFFERENTIAL  POCT I-STAT TROPONIN I  CG4 I-STAT (LACTIC ACID)   Imaging Review No results found.  MDM  No diagnosis found. Recurrent issue.  Pain meds given . Pt had a CT scan the past week and was told no acute findings, unable to obtain official results middle of the night.  Discussed repeat CT to look for change. Pt says she has had many CTs and does not want another. Pain improved in ED. Hernia reducable.  Normal  vitals.  Close fup with local surgery, pt comfortable with plan. Pt sleeping comfortable on recheck.  Syncope associated with pain, no cp in ED. EKG no acute findings.   Date: 10/06/2012  Rate: 81  Rhythm: normal sinus rhythm  QRS Axis: normal  Intervals: normal  ST/T Wave abnormalities: nonspecific ST changes  Conduction Disutrbances:none  Narrative Interpretation:  No acute findings.  Pt understands I am unable to tell new findings without CT and will return for new or worsening sxs.  DC    Enid Skeens, MD 10/06/12 [phone]

## 2012-10-06 NOTE — ED Notes (Signed)
Pt arrived from home via [facility] c/o Syncopal episode, CBG 357, pain all over including new CP. EMS 12 lead SR. Lungs clear, 98% RA, Pt administered insulin prior to EMS arrival

## 2012-10-08 LAB — URINE CULTURE: Colony Count: 30000

## 2012-11-26 ENCOUNTER — Encounter: Payer: Self-pay | Admitting: Anesthesiology

## 2012-12-09 ENCOUNTER — Encounter: Payer: Self-pay | Admitting: Anesthesiology

## 2013-01-15 ENCOUNTER — Emergency Department: Payer: Self-pay

## 2013-05-14 ENCOUNTER — Emergency Department: Payer: Self-pay | Admitting: Emergency Medicine

## 2013-05-14 LAB — URINALYSIS, COMPLETE
Bilirubin,UR: NEGATIVE
Glucose,UR: >=500 mg/dL (ref 0–75)
Ketone: NEGATIVE
LEUKOCYTE ESTERASE: NEGATIVE
Nitrite: NEGATIVE
Ph: 5 (ref 4.5–8.0)
Protein: NEGATIVE
RBC,UR: 1 /HPF (ref 0–5)
Specific Gravity: 1.038 (ref 1.003–1.030)

## 2013-07-05 ENCOUNTER — Emergency Department: Payer: Self-pay | Admitting: Emergency Medicine

## 2013-07-05 LAB — URINALYSIS, COMPLETE
BILIRUBIN, UR: NEGATIVE
Bacteria: NONE SEEN
Ketone: NEGATIVE
LEUKOCYTE ESTERASE: NEGATIVE
Nitrite: NEGATIVE
PROTEIN: NEGATIVE
Ph: 5 (ref 4.5–8.0)
RBC,UR: 2 /HPF (ref 0–5)
SPECIFIC GRAVITY: 1.03 (ref 1.003–1.030)

## 2013-07-23 ENCOUNTER — Emergency Department: Payer: Self-pay | Admitting: Emergency Medicine

## 2013-07-23 LAB — URINALYSIS, COMPLETE
Bilirubin,UR: NEGATIVE
Blood: NEGATIVE
Glucose,UR: >=500 mg/dL (ref 0–75)
Ketone: NEGATIVE
LEUKOCYTE ESTERASE: NEGATIVE
NITRITE: NEGATIVE
PH: 5 (ref 4.5–8.0)
PROTEIN: NEGATIVE
RBC,UR: 4 /HPF (ref 0–5)
Specific Gravity: 1.038 (ref 1.003–1.030)

## 2013-07-23 LAB — CBC WITH DIFFERENTIAL/PLATELET
BASOS ABS: 0.1 10*3/uL (ref 0.0–0.1)
BASOS PCT: 0.6 %
Eosinophil #: 0.1 10*3/uL (ref 0.0–0.7)
Eosinophil %: 1.2 %
HCT: 43.8 % (ref 35.0–47.0)
HGB: 13.8 g/dL (ref 12.0–16.0)
LYMPHS ABS: 3.8 10*3/uL — AB (ref 1.0–3.6)
Lymphocyte %: 38.3 %
MCH: 26.8 pg (ref 26.0–34.0)
MCHC: 31.6 g/dL — ABNORMAL LOW (ref 32.0–36.0)
MCV: 85 fL (ref 80–100)
Monocyte #: 0.5 x10 3/mm (ref 0.2–0.9)
Monocyte %: 5.5 %
Neutrophil #: 5.3 10*3/uL (ref 1.4–6.5)
Neutrophil %: 54.4 %
Platelet: 230 10*3/uL (ref 150–440)
RBC: 5.15 10*6/uL (ref 3.80–5.20)
RDW: 14.8 % — AB (ref 11.5–14.5)
WBC: 9.8 10*3/uL (ref 3.6–11.0)

## 2013-07-23 LAB — COMPREHENSIVE METABOLIC PANEL
ALBUMIN: 2.8 g/dL — AB (ref 3.4–5.0)
ALT: 30 U/L (ref 12–78)
Alkaline Phosphatase: 73 U/L
Anion Gap: 10 (ref 7–16)
BUN: 10 mg/dL (ref 7–18)
Bilirubin,Total: 0.2 mg/dL (ref 0.2–1.0)
CALCIUM: 8.5 mg/dL (ref 8.5–10.1)
Chloride: 99 mmol/L (ref 98–107)
Co2: 22 mmol/L (ref 21–32)
Creatinine: 0.91 mg/dL (ref 0.60–1.30)
EGFR (Non-African Amer.): >60
Glucose: 484 mg/dL — ABNORMAL HIGH (ref 65–99)
Osmolality: 283 (ref 275–301)
Potassium: 4.1 mmol/L (ref 3.5–5.1)
SGOT(AST): 24 U/L (ref 15–37)
SODIUM: 131 mmol/L — AB (ref 136–145)
TOTAL PROTEIN: 7.4 g/dL (ref 6.4–8.2)

## 2013-07-23 LAB — LIPASE, BLOOD: LIPASE: 147 U/L (ref 73–393)

## 2013-07-26 ENCOUNTER — Encounter (HOSPITAL_COMMUNITY): Payer: Self-pay | Admitting: Emergency Medicine

## 2013-07-26 ENCOUNTER — Emergency Department (HOSPITAL_COMMUNITY)
Admission: EM | Admit: 2013-07-26 | Discharge: 2013-07-26 | Disposition: A | Discharge status: Home / Self Care | Payer: Medicaid Other | Attending: Emergency Medicine | Admitting: Emergency Medicine

## 2013-07-26 DIAGNOSIS — G8929 Other chronic pain: Secondary | ICD-10-CM | POA: Insufficient documentation

## 2013-07-26 DIAGNOSIS — R109 Unspecified abdominal pain: Secondary | ICD-10-CM | POA: Diagnosis present

## 2013-07-26 DIAGNOSIS — J45909 Unspecified asthma, uncomplicated: Secondary | ICD-10-CM | POA: Diagnosis not present

## 2013-07-26 DIAGNOSIS — R51 Headache: Secondary | ICD-10-CM | POA: Insufficient documentation

## 2013-07-26 DIAGNOSIS — E669 Obesity, unspecified: Secondary | ICD-10-CM | POA: Diagnosis not present

## 2013-07-26 DIAGNOSIS — Z8742 Personal history of other diseases of the female genital tract: Secondary | ICD-10-CM | POA: Diagnosis not present

## 2013-07-26 DIAGNOSIS — R5383 Other fatigue: Secondary | ICD-10-CM | POA: Diagnosis not present

## 2013-07-26 DIAGNOSIS — Z8719 Personal history of other diseases of the digestive system: Secondary | ICD-10-CM | POA: Diagnosis not present

## 2013-07-26 DIAGNOSIS — R5381 Other malaise: Secondary | ICD-10-CM | POA: Insufficient documentation

## 2013-07-26 DIAGNOSIS — E119 Type 2 diabetes mellitus without complications: Secondary | ICD-10-CM | POA: Diagnosis not present

## 2013-07-26 DIAGNOSIS — Z3202 Encounter for pregnancy test, result negative: Secondary | ICD-10-CM | POA: Diagnosis not present

## 2013-07-26 DIAGNOSIS — R739 Hyperglycemia, unspecified: Secondary | ICD-10-CM

## 2013-07-26 DIAGNOSIS — Z794 Long term (current) use of insulin: Secondary | ICD-10-CM | POA: Insufficient documentation

## 2013-07-26 DIAGNOSIS — R079 Chest pain, unspecified: Secondary | ICD-10-CM | POA: Diagnosis not present

## 2013-07-26 DIAGNOSIS — Z79899 Other long term (current) drug therapy: Secondary | ICD-10-CM | POA: Insufficient documentation

## 2013-07-26 DIAGNOSIS — R519 Headache, unspecified: Secondary | ICD-10-CM

## 2013-07-26 LAB — POC URINE PREG, ED: PREG TEST UR: NEGATIVE

## 2013-07-26 LAB — COMPREHENSIVE METABOLIC PANEL
ALBUMIN: 3 g/dL — AB (ref 3.5–5.2)
ALK PHOS: 73 U/L (ref 39–117)
ALT: 27 U/L (ref 0–35)
ANION GAP: 14 (ref 5–15)
AST: 19 U/L (ref 0–37)
BUN: 10 mg/dL (ref 6–23)
CO2: 26 mEq/L (ref 19–32)
CREATININE: 0.54 mg/dL (ref 0.50–1.10)
Calcium: 8.9 mg/dL (ref 8.4–10.5)
Chloride: 92 mEq/L — ABNORMAL LOW (ref 96–112)
GFR calc Af Amer: >90 mL/min (ref >90)
GFR calc non Af Amer: >90 mL/min (ref >90)
Glucose, Bld: 296 mg/dL — ABNORMAL HIGH (ref 70–99)
POTASSIUM: 4.2 meq/L (ref 3.7–5.3)
Sodium: 132 mEq/L — ABNORMAL LOW (ref 137–147)
TOTAL PROTEIN: 6.9 g/dL (ref 6.0–8.3)
Total Bilirubin: 0.4 mg/dL (ref 0.3–1.2)

## 2013-07-26 LAB — I-STAT TROPONIN, ED: Troponin i, poc: 0.01 ng/mL (ref 0.00–0.08)

## 2013-07-26 LAB — CBG MONITORING, ED
GLUCOSE-CAPILLARY: 340 mg/dL — AB (ref 70–99)
Glucose-Capillary: 273 mg/dL — ABNORMAL HIGH (ref 70–99)

## 2013-07-26 LAB — URINE MICROSCOPIC-ADD ON

## 2013-07-26 LAB — CBC WITH DIFFERENTIAL/PLATELET
Basophils Absolute: 0 10*3/uL (ref 0.0–0.1)
Basophils Relative: 0 % (ref 0–1)
Eosinophils Absolute: 0.1 10*3/uL (ref 0.0–0.7)
Eosinophils Relative: 1 % (ref 0–5)
HEMATOCRIT: 43.8 % (ref 36.0–46.0)
Hemoglobin: 14.2 g/dL (ref 12.0–15.0)
LYMPHS PCT: 31 % (ref 12–46)
Lymphs Abs: 3.3 10*3/uL (ref 0.7–4.0)
MCH: 27.4 pg (ref 26.0–34.0)
MCHC: 32.4 g/dL (ref 30.0–36.0)
MCV: 84.6 fL (ref 78.0–100.0)
MONO ABS: 0.4 10*3/uL (ref 0.1–1.0)
Monocytes Relative: 4 % (ref 3–12)
NEUTROS ABS: 6.7 10*3/uL (ref 1.7–7.7)
Neutrophils Relative %: 64 % (ref 43–77)
PLATELETS: 236 10*3/uL (ref 150–400)
RBC: 5.18 MIL/uL — AB (ref 3.87–5.11)
RDW: 14.2 % (ref 11.5–15.5)
WBC: 10.6 10*3/uL — AB (ref 4.0–10.5)

## 2013-07-26 LAB — URINALYSIS, ROUTINE W REFLEX MICROSCOPIC
BILIRUBIN URINE: NEGATIVE
Glucose, UA: >1000 mg/dL — AB
Hgb urine dipstick: NEGATIVE
KETONES UR: NEGATIVE mg/dL
Leukocytes, UA: NEGATIVE
NITRITE: NEGATIVE
PROTEIN: NEGATIVE mg/dL
Specific Gravity, Urine: 1.041 — ABNORMAL HIGH (ref 1.005–1.030)
UROBILINOGEN UA: 0.2 mg/dL (ref 0.0–1.0)
pH: 5 (ref 5.0–8.0)

## 2013-07-26 LAB — LIPASE, BLOOD: LIPASE: 27 U/L (ref 11–59)

## 2013-07-26 MED ORDER — METOCLOPRAMIDE HCL 5 MG/ML IJ SOLN
10.0000 mg | Freq: Once | INTRAMUSCULAR | Status: AC
  Administered 2013-07-26: 10 mg via INTRAVENOUS
  Filled 2013-07-26: qty 2

## 2013-07-26 MED ORDER — OXYCODONE-ACETAMINOPHEN 5-325 MG PO TABS
1.0000 | ORAL_TABLET | Freq: Once | ORAL | Status: AC
  Administered 2013-07-26: 1 via ORAL
  Filled 2013-07-26: qty 1

## 2013-07-26 MED ORDER — IBUPROFEN 400 MG PO TABS
600.0000 mg | ORAL_TABLET | Freq: Once | ORAL | Status: AC
  Administered 2013-07-26: 600 mg via ORAL
  Filled 2013-07-26 (×2): qty 1

## 2013-07-26 MED ORDER — OXYCODONE-ACETAMINOPHEN 5-325 MG PO TABS
2.0000 | ORAL_TABLET | Freq: Once | ORAL | Status: AC
  Administered 2013-07-26: 1 via ORAL
  Filled 2013-07-26: qty 2

## 2013-07-26 MED ORDER — ONDANSETRON 4 MG PO TBDP
8.0000 mg | ORAL_TABLET | Freq: Once | ORAL | Status: AC
  Administered 2013-07-26: 8 mg via ORAL
  Filled 2013-07-26: qty 2

## 2013-07-26 MED ORDER — METOCLOPRAMIDE HCL 10 MG PO TABS
10.0000 mg | ORAL_TABLET | Freq: Once | ORAL | Status: DC
  Filled 2013-07-26: qty 1

## 2013-07-26 MED ORDER — DIPHENHYDRAMINE HCL 50 MG/ML IJ SOLN
25.0000 mg | Freq: Once | INTRAMUSCULAR | Status: AC
  Administered 2013-07-26: 25 mg via INTRAVENOUS
  Filled 2013-07-26: qty 1

## 2013-07-26 MED ORDER — INSULIN ASPART PROT & ASPART (70-30 MIX) 100 UNIT/ML ~~LOC~~ SUSP
35.0000 [IU] | Freq: Once | SUBCUTANEOUS | Status: AC
  Administered 2013-07-26: 35 [IU] via SUBCUTANEOUS
  Filled 2013-07-26: qty 10

## 2013-07-26 NOTE — ED Notes (Signed)
CBG - 273 

## 2013-07-26 NOTE — ED Notes (Signed)
The pt walked to the br .  She is upset that she did not get the headache cocktail.  She  Reports that  She normally takes percocet and   It has not helped her headache.  She wants med by iv

## 2013-07-26 NOTE — ED Notes (Signed)
Pt to triage via [facility]> C/o generalized abd pain, abd swelling, hyperglycemia, and generalized weakness x 1-2 weeks.  Reports pain in center of chest that started today with mild nausea and vomited x 3.  States she has been taking meds as instructed and unable to keep blood sugar under 400.

## 2013-07-26 NOTE — ED Notes (Signed)
Dr. wofford at the bedside 

## 2013-07-26 NOTE — ED Notes (Signed)
The pt is still c/o pain she takes percocet 10mg  4 times a day and she has not had any since 2000

## 2013-07-26 NOTE — ED Notes (Signed)
The pt is c/o a backache a headache she thinks she has a  Migraine.  She also has chest and abd pain.  Up walking to the br no assistance needed.  Still waiting to be seen

## 2013-07-26 NOTE — ED Provider Notes (Signed)
CSN: [id number]     Arrival date & time 07/26/13  0003 History   First MD Initiated Contact with Patient 07/26/13 [phone]     Chief Complaint  Patient presents with  . Hyperglycemia  . Chest Pain  . Abdominal Pain     (Consider location/radiation/quality/duration/timing/severity/associated sxs/prior Treatment) Patient is a 38 y.o. female presenting with hyperglycemia.  Hyperglycemia Blood sugar level PTA:  400's Severity:  Moderate Onset quality:  Gradual Duration: chronic. Progression:  Unchanged Chronicity:  Chronic Diabetes status:  Controlled with insulin Time since last antidiabetic medication: prior to arrival. Context: not noncompliance   Relieved by:  Nothing Ineffective treatments:  Insulin Associated symptoms: abdominal pain, chest pain, malaise and weakness   Associated symptoms comment:  Headache   Past Medical History  Diagnosis Date  . Diabetes mellitus without complication   . Asthma   . Hernia   . Kidney infection    Past Surgical History  Procedure Laterality Date  . Colonscopy     No family history on file. History  Substance Use Topics  . Smoking status: Never Smoker   . Smokeless tobacco: Not on file  . Alcohol Use: No   OB History   Grav Para Term Preterm Abortions TAB SAB Ect Mult Living                 Review of Systems  Cardiovascular: Positive for chest pain.  Gastrointestinal: Positive for abdominal pain.  All other systems reviewed and are negative.     Allergies  Morphine and related; Darvocet; Toradol; and Ultram  Home Medications   Prior to Admission medications   Medication Sig Start Date End Date Taking? Authorizing Provider  albuterol (PROVENTIL HFA;VENTOLIN HFA) 108 (90 BASE) MCG/ACT inhaler Inhale 2 puffs into the lungs every 4 (four) hours as needed for wheezing.   Yes Historical Provider, MD  famotidine (PEPCID) 20 MG tablet Take 1 tablet (20 mg total) by mouth 2 (two) times daily. 05/20/12  Yes Sunnie Nielsen, MD    gabapentin (NEURONTIN) 300 MG capsule Take 300 mg by mouth 2 (two) times daily.  09/23/12 09/23/13 Yes Historical Provider, MD  insulin NPH-regular Human (NOVOLIN 70/30) (70-30) 100 UNIT/ML injection Inject 25-35 Units into the skin 2 (two) times daily with a meal. 35 units in the morning and 25 units at night   Yes Historical Provider, MD  lisinopril (PRINIVIL,ZESTRIL) 10 MG tablet Take 10 mg by mouth daily.   Yes Historical Provider, MD  Norethindrone-Mestranol (NECON 1/50, 28,) 1-50 MG-MCG tablet Take 1 tablet by mouth daily.   Yes Historical Provider, MD  OxyCODONE (OXYCONTIN) 10 mg T12A 12 hr tablet Take 10 mg by mouth 4 (four) times daily.   Yes Historical Provider, MD   BP 124/79  Pulse 78  Temp(Src) 98.1 F (36.7 C) (Oral)  Resp 18  SpO2 97% Physical Exam  Nursing note and vitals reviewed. Constitutional: She is oriented to person, place, and time. She appears well-developed and well-nourished. No distress.  obese  HENT:  Head: Normocephalic and atraumatic.  Mouth/Throat: Oropharynx is clear and moist.  Eyes: Conjunctivae are normal. Pupils are equal, round, and reactive to light. No scleral icterus.  Neck: Neck supple.  Cardiovascular: Normal rate, regular rhythm, normal heart sounds and intact distal pulses.   No murmur heard. Pulmonary/Chest: Effort normal and breath sounds normal. No stridor. No respiratory distress. She has no rales.    Abdominal: Soft. Bowel sounds are normal. She exhibits no distension. There is generalized tenderness. There  is no rigidity, no rebound and no guarding.  Musculoskeletal: Normal range of motion.  Neurological: She is alert and oriented to person, place, and time. GCS eye subscore is 4. GCS verbal subscore is 5. GCS motor subscore is 6.  Skin: Skin is warm and dry. No rash noted.  Psychiatric: She has a normal mood and affect. Her behavior is normal.    ED Course  Procedures (including critical care time) Labs Review Labs Reviewed  CBC  WITH DIFFERENTIAL - Abnormal; Notable for the following:    WBC 10.6 (*)    RBC 5.18 (*)    All other components within normal limits  COMPREHENSIVE METABOLIC PANEL - Abnormal; Notable for the following:    Sodium 132 (*)    Chloride 92 (*)    Glucose, Bld 296 (*)    Albumin 3.0 (*)    All other components within normal limits  URINALYSIS, ROUTINE W REFLEX MICROSCOPIC - Abnormal; Notable for the following:    Specific Gravity, Urine 1.041 (*)    Glucose, UA >1000 (*)    All other components within normal limits  CBG MONITORING, ED - Abnormal; Notable for the following:    Glucose-Capillary 273 (*)    All other components within normal limits  CBG MONITORING, ED - Abnormal; Notable for the following:    Glucose-Capillary 340 (*)    All other components within normal limits  LIPASE, BLOOD  URINE MICROSCOPIC-ADD ON  I-STAT TROPOININ, ED  POC URINE PREG, ED    Imaging Review No results found.   EKG Interpretation   Date/Time:  Saturday July 26 2013 00:09:58 EDT Ventricular Rate:  93 PR Interval:  158 QRS Duration: 86 QT Interval:  350 QTC Calculation: 435 R Axis:   67 Text Interpretation:  Normal sinus rhythm Cannot rule out Anterior infarct  , age undetermined Abnormal ECG No significant change was found Confirmed  by [facility] CenterWOFFORD  MD, TREY ([id number]) on 07/26/2013 8:23:29 AM      MDM   Final diagnoses:  Hyperglycemia  Abdominal pain, unspecified abdominal location  Headache, unspecified headache type    38 yo female with hx of obesity and DM presenting with multiple complaints including hyperglycemia, malaise, headache, weakness, chest pain, abdominal pain.  These symptoms have been chronic, but worse for past several weeks.  She was evaluated at an outside ED two days ago and reports she had a negative CT of her abdomen at that time.  She was told that if she couldn't control her blood sugar, she may need admission to the hospital.    Her glucose today appears to be at her  baseline (300s to 400s) without acute complications such as DKA.  She was rehydrated with 2L IV NS.  I don't think she needs repeat abdominal imaging based on her exam and her reported recent negative imaging.  She also complained of headache consistent with her chronic headaches and requested a "migraine cocktail" in her IV.  Treated with IV metoclopramide, diphenhydramine, and fluids.    She was advised to follow up with her PCP, with whom she expressed frustration.  She was provided a resource list to help her establish alternative care if she desired.      Candyce ChurnJohn David Evanee Lubrano III, MD 07/26/13 2352

## 2013-07-26 NOTE — Discharge Instructions (Signed)
Abdominal Pain, Women °Abdominal (stomach, pelvic, or belly) pain can be caused by many things. It is important to tell your doctor: °· The location of the pain. °· Does it come and go or is it present all the time? °· Are there things that start the pain (eating certain foods, exercise)? °· Are there other symptoms associated with the pain (fever, nausea, vomiting, diarrhea)? °All of this is helpful to know when trying to find the cause of the pain. °CAUSES  °· Stomach: virus or bacteria infection, or ulcer. °· Intestine: appendicitis (inflamed appendix), regional ileitis (Crohn's disease), ulcerative colitis (inflamed colon), irritable bowel syndrome, diverticulitis (inflamed diverticulum of the colon), or cancer of the stomach or intestine. °· Gallbladder disease or stones in the gallbladder. °· Kidney disease, kidney stones, or infection. °· Pancreas infection or cancer. °· Fibromyalgia (pain disorder). °· Diseases of the female organs: °¨ Uterus: fibroid (non-cancerous) tumors or infection. °¨ Fallopian tubes: infection or tubal pregnancy. °¨ Ovary: cysts or tumors. °¨ Pelvic adhesions (scar tissue). °¨ Endometriosis (uterus lining tissue growing in the pelvis and on the pelvic organs). °¨ Pelvic congestion syndrome (female organs filling up with blood just before the menstrual period). °¨ Pain with the menstrual period. °¨ Pain with ovulation (producing an egg). °¨ Pain with an IUD (intrauterine device, birth control) in the uterus. °¨ Cancer of the female organs. °· Functional pain (pain not caused by a disease, may improve without treatment). °· Psychological pain. °· Depression. °DIAGNOSIS  °Your doctor will decide the seriousness of your pain by doing an examination. °· Blood tests. °· X-rays. °· Ultrasound. °· CT scan (computed tomography, special type of X-ray). °· MRI (magnetic resonance imaging). °· Cultures, for infection. °· Barium enema (dye inserted in the large intestine, to better view it with  X-rays). °· Colonoscopy (looking in intestine with a lighted tube). °· Laparoscopy (minor surgery, looking in abdomen with a lighted tube). °· Major abdominal exploratory surgery (looking in abdomen with a large incision). °TREATMENT  °The treatment will depend on the cause of the pain.  °· Many cases can be observed and treated at home. °· Over-the-counter medicines recommended by your caregiver. °· Prescription medicine. °· Antibiotics, for infection. °· Birth control pills, for painful periods or for ovulation pain. °· Hormone treatment, for endometriosis. °· Nerve blocking injections. °· Physical therapy. °· Antidepressants. °· Counseling with a psychologist or psychiatrist. °· Minor or major surgery. °HOME CARE INSTRUCTIONS  °· Do not take laxatives, unless directed by your caregiver. °· Take over-the-counter pain medicine only if ordered by your caregiver. Do not take aspirin because it can cause an upset stomach or bleeding. °· Try a clear liquid diet (broth or water) as ordered by your caregiver. Slowly move to a bland diet, as tolerated, if the pain is related to the stomach or intestine. °· Have a thermometer and take your temperature several times a day, and record it. °· Bed rest and sleep, if it helps the pain. °· Avoid sexual intercourse, if it causes pain. °· Avoid stressful situations. °· Keep your follow-up appointments and tests, as your caregiver orders. °· If the pain does not go away with medicine or surgery, you may try: °¨ Acupuncture. °¨ Relaxation exercises (yoga, meditation). °¨ Group therapy. °¨ Counseling. °SEEK MEDICAL CARE IF:  °· You notice certain foods cause stomach pain. °· Your home care treatment is not helping your pain. °· You need stronger pain medicine. °· You want your IUD removed. °· You feel faint or   lightheaded.  You develop nausea and vomiting.  You develop a rash.  You are having side effects or an allergy to your medicine. SEEK IMMEDIATE MEDICAL CARE IF:   Your  pain does not go away or gets worse.  You have a fever.  Your pain is felt only in portions of the abdomen. The right side could possibly be appendicitis. The left lower portion of the abdomen could be colitis or diverticulitis.  You are passing blood in your stools (bright red or black tarry stools, with or without vomiting).  You have blood in your urine.  You develop chills, with or without a fever.  You pass out. MAKE SURE YOU:   Understand these instructions.  Will watch your condition.  Will get help right away if you are not doing well or get worse. Document Released: 10/23/2006 Document Revised: 03/20/2011 Document Reviewed: 11/12/2008 [facility] Patient Information 2015 [place], [place]. This information is not intended to replace advice given to you by your health care provider. Make sure you discuss any questions you have with your health care provider.  Hyperglycemia Hyperglycemia occurs when the glucose (sugar) in your blood is too high. Hyperglycemia can happen for many reasons, but it most often happens to people who do not know they have diabetes or are not managing their diabetes properly.  CAUSES  Whether you have diabetes or not, there are other causes of hyperglycemia. Hyperglycemia can occur when you have diabetes, but it can also occur in other situations that you might not be as aware of, such as: Diabetes  If you have diabetes and are having problems controlling your blood glucose, hyperglycemia could occur because of some of the following reasons:  Not following your meal plan.  Not taking your diabetes medications or not taking it properly.  Exercising less or doing less activity than you normally do.  Being sick. Pre-diabetes  This cannot be ignored. Before people develop Type 2 diabetes, they almost always have "pre-diabetes." This is when your blood glucose levels are higher than normal, but not yet high enough to be diagnosed as diabetes. Research  has shown that some long-term damage to the body, especially the heart and circulatory system, may already be occurring during pre-diabetes. If you take action to manage your blood glucose when you have pre-diabetes, you may delay or prevent Type 2 diabetes from developing. Stress  If you have diabetes, you may be "diet" controlled or on oral medications or insulin to control your diabetes. However, you may find that your blood glucose is higher than usual in the hospital whether you have diabetes or not. This is often referred to as "stress hyperglycemia." Stress can elevate your blood glucose. This happens because of hormones put out by the body during times of stress. If stress has been the cause of your high blood glucose, it can be followed regularly by your caregiver. That way he/she can make sure your hyperglycemia does not continue to get worse or progress to diabetes. Steroids  Steroids are medications that act on the infection fighting system (immune system) to block inflammation or infection. One side effect can be a rise in blood glucose. Most people can produce enough extra insulin to allow for this rise, but for those who cannot, steroids make blood glucose levels go even higher. It is not unusual for steroid treatments to "uncover" diabetes that is developing. It is not always possible to determine if the hyperglycemia will go away after the steroids are stopped. A special  blood test called an A1c is sometimes done to determine if your blood glucose was elevated before the steroids were started. SYMPTOMS  Thirsty.  Frequent urination.  Dry mouth.  Blurred vision.  Tired or fatigue.  Weakness.  Sleepy.  Tingling in feet or leg. DIAGNOSIS  Diagnosis is made by monitoring blood glucose in one or all of the following ways:  A1c test. This is a chemical found in your blood.  Fingerstick blood glucose monitoring.  Laboratory results. TREATMENT  First, knowing the cause of  the hyperglycemia is important before the hyperglycemia can be treated. Treatment may include, but is not be limited to:  Education.  Change or adjustment in medications.  Change or adjustment in meal plan.  Treatment for an illness, infection, etc.  More frequent blood glucose monitoring.  Change in exercise plan.  Decreasing or stopping steroids.  Lifestyle changes. HOME CARE INSTRUCTIONS   Test your blood glucose as directed.  Exercise regularly. Your caregiver will give you instructions about exercise. Pre-diabetes or diabetes which comes on with stress is helped by exercising.  Eat wholesome, balanced meals. Eat often and at regular, fixed times. Your caregiver or nutritionist will give you a meal plan to guide your sugar intake.  Being at an ideal weight is important. If needed, losing as little as 10 to 15 pounds may help improve blood glucose levels. SEEK MEDICAL CARE IF:   You have questions about medicine, activity, or diet.  You continue to have symptoms (problems such as increased thirst, urination, or weight gain). SEEK IMMEDIATE MEDICAL CARE IF:   You are vomiting or have diarrhea.  Your breath smells fruity.  You are breathing faster or slower.  You are very sleepy or incoherent.  You have numbness, tingling, or pain in your feet or hands.  You have chest pain.  Your symptoms get worse even though you have been following your caregiver's orders.  If you have any other questions or concerns. Document Released: 06/21/2000 Document Revised: 03/20/2011 Document Reviewed: 04/24/2011 [facility] Patient Information 2015 [place], [place]. This information is not intended to replace advice given to you by your health care provider. Make sure you discuss any questions you have with your health care provider.   Emergency Department Resource Guide 1) Find a Doctor and Pay Out of Pocket Although you won't have to find out who is covered by your insurance plan, it  is a good idea to ask around and get recommendations. You will then need to call the office and see if the doctor you have chosen will accept you as a new patient and what types of options they offer for patients who are self-pay. Some doctors offer discounts or will set up payment plans for their patients who do not have insurance, but you will need to ask so you aren't surprised when you get to your appointment.  2) Contact Your Local Health Department Not all health departments have doctors that can see patients for sick visits, but many do, so it is worth a call to see if yours does. If you don't know where your local health department is, you can check in your phone book. The CDC also has a tool to help you locate your state's health department, and many state websites also have listings of all of their local health departments.  3) Find a Walk-in Clinic If your illness is not likely to be very severe or complicated, you may want to try a walk in clinic. These are popping  up all over the country in pharmacies, drugstores, and shopping centers. They're usually staffed by nurse practitioners or physician assistants that have been trained to treat common illnesses and complaints. They're usually fairly quick and inexpensive. However, if you have serious medical issues or chronic medical problems, these are probably not your best option.  No Primary Care Doctor: - Call Health Connect at  [phone] - they can help you locate a primary care doctor that  accepts your insurance, provides certain services, etc. - Physician Referral Service- [phone]  Chronic Pain Problems: Organization         Address  Phone   Notes  [facility]  [phone] Patients need to be referred by their primary care doctor.   Medication Assistance: Organization         Address  Phone   Notes  [facility] Medication [facility] [address] [place] [place], [place] [address] [phone] --Must be a resident of [facility] -- Must have NO insurance coverage whatsoever (no Medicaid/ Medicare, etc.) -- The pt. MUST have a primary care doctor that directs their care regularly and follows them in the community   [facility]  [phone]   [employer]  [phone]    Agencies that provide inexpensive medical care: Organization         Address  Phone   Notes  [facility]  [phone]   [facility]    [phone]   [facility] [address] [place], [place] [address] [phone]   [facility] [address] [place], [place] [phone]   Planned Parenthood    [phone]   [facility]    [phone]   [facility]  [address], [place] Phone:  [phone], Fax:  [phone] Hours of Operation:  9 am - 6 pm, M-F.  Also accepts Medicaid/Medicare and self-pay.  [facility]  [address], [place] Phone: [phone], Fax: [phone]. Hours of Operation:  8:30 am - 5:30 pm, M-F.  Also accepts Medicaid and self-pay.  [facility] [place] [address], [place] Phone: [phone]   [facility] [address] Natasha Bence [place], [place] [phone], Ext. [phone] Mondays & Thursdays: 7-9 AM.  First 15 patients are seen on a first come, first serve basis.    Medicaid-accepting [facility] Providers:  Organization         Address  Phone   Notes  [facility] [address], [place] [phone] Also accepts self-pay patients.  [facility] [address] Laurell Josephs [place], [place]  [phone]   [facility] [address], [place] [phone]   [facility] [address], [place] [phone]   Renaye Rakers [address], [place]   [phone] Only accepts [place] patients after they have their name applied to their card.   Self-Pay (no insurance) in [facility]:  Organization         Address  Phone   Notes  Sickle Cell Patients, [facility] [address] [place], [place] [phone]   [facility] [address] [place], [place] [phone]   [facility] [place]  [phone]  [address], [place] [phone]   [facility]/Dr. Osei-Bonsu  [address] [place] [address], [place] or [address], [place] [phone]) [phone] Phone number for both [place] and [place] locations is the same.  [facility] [address], [place] [phone]   [facility] [address] [place] [address], [place] or [address] [phone]   [facility] [address], [place] [phone]) [phone], phone; [phone], fax Sees patients 1st and 3rd Saturday of every month.  Must not qualify for public or private insurance (i.e. Medicaid, Medicare, [place] Health Choice, Veterans' Benefits)  Household income should be no more than 200% of the poverty level The clinic cannot treat you if you are pregnant or think you are pregnant  Sexually transmitted diseases are not treated at the clinic.    Dental Care: Organization         Address  Phone  Notes  [facility] [address], [place] [phone] Accepts children up to age 38 who are enrolled in [place] or [place] Health Choice; pregnant women with a Medicaid card; and children who have applied for Medicaid or [place] Health Choice, but were declined, whose parents can pay a reduced fee at time of service.  [facility] [place]  [address], [place] [phone] Accepts children up to age 38 who are enrolled in [place] or [place] Health  Choice; pregnant women with a Medicaid card; and children who have applied for Medicaid or [place] Health Choice, but were declined, whose parents can pay a reduced fee at time of service.  [facility]  [address], [place] [phone] Patients are seen by appointment only. Walk-ins are not accepted. [facility] will see patients 38 years of age and older. Monday - Tuesday (8am-5pm) Most Wednesdays (8:30-5pm) $30 per visit, cash only  [facility]  [address], [facility] [phone] Patients are seen by appointment only. Walk-ins are not accepted. [facility] will see patients 38 years of age and older. One Wednesday Evening (Monthly: Volunteer Based).  $30 per visit, cash only  [employer] [facility]  [phone] for adults; Children under age 754, call Graduate Pediatric Dentistry at [phone]) [phone]. Children aged 364-14, please call [phone] to request a pediatric application.  Dental services are provided in all areas of dental care including fillings, crowns and bridges, complete and partial dentures, implants, gum treatment, root canals, and extractions. Preventive care is also provided. Treatment is provided to both adults and children. Patients are selected via a lottery and there is often a waiting list.   [facility] [address], [place]  [phone] [email or web address]   [facility] [address], [place], [place] [phone], Ext. [phone] Second and Fourth Thursday of each month, opens at 6:30 AM; Clinic ends at 9 AM.  Patients are seen on a first-come first-served basis, and a limited number are seen during each clinic.   [facility]  [address], [place], [place] [phone]) [phone]   Eligibility Requirements You must have lived in [place], or [place] for at least the last three months.   You cannot be eligible for state or  federal sponsored [place] Cityhealthcare insurance, including CIGNAVeterans Administration, [place]icaid, or  Medicare.   You generally cannot be eligible for healthcare insurance through your employer.    How to apply: Eligibility screenings are held every Tuesday and Wednesday afternoon from 1:00 pm until 4:00 pm. You do not need an appointment for the interview!  [facility] [address], [place], [place] [phone]   [facility]  [phone]   [facility]  [phone]   [facility] Health Department  [phone]    Behavioral Health Resources in the Community: Intensive Outpatient Programs Organization         Address  Phone  Notes  [facility] [address], [place], [place] [phone]   [facility] [address], [place], [place] [phone]   [facility] [address] [place], [place]  [phone]   [facility] [address],  [place], [place] [phone] or [phone]   Substance Abuse Resources Organization         Address  Phone  Notes  [facility] Drug Services  [phone]   [facility]  [phone]   [facility] [facility]  [phone]   Floydene Flock  [phone]   Residential & Outpatient Substance Abuse Program  [phone]   Psychological Services Organization         Address  Phone  Notes  [facility]al Health  [phone]   [facility]  [phone]   [facility] Mental Health [address], [place] [phone] or [phone]    Mobile Crisis Teams Organization         Address  Phone  Notes  [facility], Mobile Crisis Care Unit  [phone]   [facility]  [address] [place], [place] [phone]   Doristine Locks [address] [place] [phone]    Self-Help/Support Groups Organization         Address  Phone              Notes  [facility] [place] - variety of support groups  [phone]- I7437963 Call for more information  Narcotics Anonymous (NA), [facility] [address], [employer] [place]  2 meetings at this location   Statistician         Address  Phone  Notes  [facility] [address],    [place]  [phone]   [facility]  [address], [place] [address], [place], [place] [phone]   [facility] [address] [place], [place] [phone] Admissions: 8am-3pm M-F  [facility] [address],    [place], [place] [phone]   [facility] [address] [place], [place], [place] [phone]   [facility] [address]  [place], [place] [phone]   [facility] - Intensive Outpatient [address]ure[address], [place], [place] [phone]   [facility] [address]  [place], [place] [phone] or [phone]   [facility] ([facility]) [address] [place], [place] [phone] Accepts Medicaid  [facility] [address],  [place] [phone] Substance Abuse/Addiction Treatment   [facility] Organization         Address  Phone  Notes  [facility]  [phone]   Angie Fava, PhD [address] [place], [place]   [phone] or [phone]   [facility]  [address] [place], [place] [phone]   [facility] [address] [place], [place] [phone] Insurance/Medicaid/sponsorship through [facility] and Families [address]                                    [place], [place] [phone] Therapy/tele-psych/case  [facility] [address]   [place], [place] [phone]    Dr. Lolly Mustache  [phone]   [facility]  [employer] [facility]  Dept. 1) [address] [place] [address],  2) [address] [place] 3)  [address], [place] [phone]  [phone]   [facility] Child Abuse Hotline [phone] or [phone] (After Hours)

## 2013-08-01 ENCOUNTER — Observation Stay: Payer: Self-pay | Admitting: Internal Medicine

## 2013-08-01 LAB — CBC WITH DIFFERENTIAL/PLATELET
BASOS ABS: 0.1 10*3/uL (ref 0.0–0.1)
Basophil %: 0.5 %
Eosinophil #: 0.1 10*3/uL (ref 0.0–0.7)
Eosinophil %: 0.6 %
HCT: 43.7 % (ref 35.0–47.0)
HGB: 13.6 g/dL (ref 12.0–16.0)
Lymphocyte #: 2.7 10*3/uL (ref 1.0–3.6)
Lymphocyte %: 18.2 %
MCH: 26.3 pg (ref 26.0–34.0)
MCHC: 31.2 g/dL — AB (ref 32.0–36.0)
MCV: 84 fL (ref 80–100)
Monocyte #: 0.6 x10 3/mm (ref 0.2–0.9)
Monocyte %: 4.1 %
NEUTROS PCT: 76.6 %
Neutrophil #: 11.6 10*3/uL — ABNORMAL HIGH (ref 1.4–6.5)
Platelet: 253 10*3/uL (ref 150–440)
RBC: 5.17 10*6/uL (ref 3.80–5.20)
RDW: 15.3 % — ABNORMAL HIGH (ref 11.5–14.5)
WBC: 15.1 10*3/uL — AB (ref 3.6–11.0)

## 2013-08-01 LAB — COMPREHENSIVE METABOLIC PANEL
ALK PHOS: 64 U/L
ALT: 31 U/L
Albumin: 2.7 g/dL — ABNORMAL LOW (ref 3.4–5.0)
Anion Gap: 9 (ref 7–16)
BILIRUBIN TOTAL: 0.6 mg/dL (ref 0.2–1.0)
BUN: 11 mg/dL (ref 7–18)
CHLORIDE: 97 mmol/L — AB (ref 98–107)
CO2: 23 mmol/L (ref 21–32)
CREATININE: 0.75 mg/dL (ref 0.60–1.30)
Calcium, Total: 8.5 mg/dL (ref 8.5–10.1)
EGFR (Non-African Amer.): >60
Glucose: 344 mg/dL — ABNORMAL HIGH (ref 65–99)
Osmolality: 272 (ref 275–301)
POTASSIUM: 4.4 mmol/L (ref 3.5–5.1)
SGOT(AST): 33 U/L (ref 15–37)
Sodium: 129 mmol/L — ABNORMAL LOW (ref 136–145)
Total Protein: 7.3 g/dL (ref 6.4–8.2)

## 2013-08-01 LAB — URINALYSIS, COMPLETE
BACTERIA: NONE SEEN
Bilirubin,UR: NEGATIVE
Blood: NEGATIVE
Leukocyte Esterase: NEGATIVE
NITRITE: NEGATIVE
PROTEIN: NEGATIVE
Ph: 5 (ref 4.5–8.0)
RBC,UR: NONE SEEN /HPF (ref 0–5)
SPECIFIC GRAVITY: 1.026 (ref 1.003–1.030)
Squamous Epithelial: 2
WBC UR: NONE SEEN /HPF (ref 0–5)

## 2013-08-01 LAB — TROPONIN I: Troponin-I: <0.02 ng/mL

## 2013-08-01 LAB — CLOSTRIDIUM DIFFICILE(ARMC)

## 2013-08-01 LAB — LIPASE, BLOOD: Lipase: 87 U/L (ref 73–393)

## 2013-08-02 LAB — CBC WITH DIFFERENTIAL/PLATELET
BASOS PCT: 0.4 %
Basophil #: 0 10*3/uL (ref 0.0–0.1)
EOS PCT: 2 %
Eosinophil #: 0.2 10*3/uL (ref 0.0–0.7)
HCT: 38.7 % (ref 35.0–47.0)
HGB: 12.5 g/dL (ref 12.0–16.0)
LYMPHS PCT: 29.3 %
Lymphocyte #: 2.8 10*3/uL (ref 1.0–3.6)
MCH: 27.3 pg (ref 26.0–34.0)
MCHC: 32.4 g/dL (ref 32.0–36.0)
MCV: 84 fL (ref 80–100)
MONOS PCT: 6.3 %
Monocyte #: 0.6 x10 3/mm (ref 0.2–0.9)
NEUTROS ABS: 5.9 10*3/uL (ref 1.4–6.5)
Neutrophil %: 62 %
PLATELETS: 225 10*3/uL (ref 150–440)
RBC: 4.59 10*6/uL (ref 3.80–5.20)
RDW: 14.9 % — ABNORMAL HIGH (ref 11.5–14.5)
WBC: 9.6 10*3/uL (ref 3.6–11.0)

## 2013-08-02 LAB — BASIC METABOLIC PANEL
Anion Gap: 6 — ABNORMAL LOW (ref 7–16)
BUN: 11 mg/dL (ref 7–18)
Calcium, Total: 7.7 mg/dL — ABNORMAL LOW (ref 8.5–10.1)
Chloride: 96 mmol/L — ABNORMAL LOW (ref 98–107)
Co2: 30 mmol/L (ref 21–32)
Creatinine: 0.78 mg/dL (ref 0.60–1.30)
EGFR (African American): >60
GLUCOSE: 315 mg/dL — AB (ref 65–99)
Osmolality: 276 (ref 275–301)
Potassium: 4 mmol/L (ref 3.5–5.1)
Sodium: 132 mmol/L — ABNORMAL LOW (ref 136–145)

## 2013-08-02 LAB — HEMOGLOBIN A1C: Hemoglobin A1C: 13.1 % — ABNORMAL HIGH (ref 4.2–6.3)

## 2013-08-02 LAB — TSH: Thyroid Stimulating Horm: 2.34 u[IU]/mL

## 2013-08-05 LAB — STOOL CULTURE

## 2013-11-13 ENCOUNTER — Emergency Department: Payer: Self-pay | Admitting: Emergency Medicine

## 2014-02-18 ENCOUNTER — Emergency Department: Payer: Self-pay | Admitting: Emergency Medicine

## 2014-02-21 ENCOUNTER — Emergency Department: Payer: Self-pay | Admitting: Emergency Medicine

## 2014-02-25 ENCOUNTER — Emergency Department: Payer: Self-pay | Admitting: Student

## 2014-03-07 ENCOUNTER — Emergency Department: Payer: Self-pay | Admitting: Emergency Medicine

## 2014-03-09 ENCOUNTER — Emergency Department: Payer: Self-pay | Admitting: Emergency Medicine

## 2014-05-01 NOTE — Consult Note (Signed)
Pt seen and examined. Full consult to follow. Poor historian. DM x 5-6 yrs that is poorly controlled. Chronic N/V/D with abd/back pain for years. No documentation of diabetic gastropareisi. Had normal EGD in 2012 but colon showed focal active colitis though colon looked grossly normal. Only thing different this time is UTI that does not appear to respond to oral Abx. Morbidly obese. Continue Abx. Manage BG levels. Consider repeating EGD/colonoscopy and gastric emptying scan later if no signif improvement. Thanks   Electronic Signatures: Lutricia Feilh, Woodie Degraffenreid (MD) (Signed on 09-Feb-14 09:39)  Authored   Last Updated: 09-Feb-14 09:42 by Lutricia Feilh, Burle Kwan (MD)

## 2014-05-01 NOTE — Discharge Summary (Signed)
PATIENT NAME:  Caitlyn Fuller, Caitlyn Fuller MR#:  [id number] DATE OF BIRTH:  02-Mar-1975  DATE OF ADMISSION:  02/17/2012 DATE OF DISCHARGE:  02/21/2012  ADMITTING DIAGNOSES:  Gastroenteritis, gastroparesis, urinary tract infection.  DISCHARGE DIAGNOSES:   1.  Abdominal pain, diffuse, questionable viral gastroenteritis.  2.  Chronic diarrhea. 3.  Rectal bleed, unclear etiology at this time.  Colonoscopy to be done as outpatient.  Rectal bleed resolved.  4.  Pyuria, but no urinary tract infection.  5.  Headache, likely obstructive sleep apnea-related.  6.  Morbid obesity.  7.  History of hypertension. 8.  Diabetes mellitus, insulin dependent.  Hemoglobin A1c 11.5.  9.  Chronic back pain.  10.  History of asthma. 11.  Multiple abscesses as well as genital herpes.   DISCHARGE CONDITION:  Stable.   DISCHARGE MEDICATIONS:  The patient is to continue:  1.  Metformin 500 mg by mouth twice daily.  2.  Lisinopril 10 mg by mouth daily.  3.  Necon 1 tablet once daily.  4.  Albuterol CFC free 2 puffs 4 times daily as needed.  5.  Acetaminophen oxycodone 325/10 mg 2 tablets every 4 hours as needed.  6.  Insulin lantus 50 units once daily. 7.  Nystatin topical cream to affected area every 4 hours as needed.  8.  Norflex 100 mg by mouth twice daily.  9.  The patient is not to take Cipro or 40 units of Lantus.   HOME OXYGEN:  None.   DIET:  2 gram salt, low fat, low cholesterol.  The patient was advised to lose weight.  The patient's diet will be mechanical soft, frequent small meals, low calorie.  Advance to regular diet as tolerated.  Carbohydrate controlled diet.  ACTIVITY LIMITATIONS:  As tolerated.   FOLLOW-UP APPOINTMENT:  With [facility] in 2 days after discharge, as well as Dr. Bluford Kaufmann in 2 days after discharge.  The patient was also advised to get outpatient sleep study as soon as possible to qualify her for CPAP machine to help her with the headaches as well as suspected obstructive sleep apnea.    CONSULTANTS:  Dr. Bluford Kaufmann.   PROCEDURES:  Upper GI endoscopy 02/21/2012 revealing normal esophagus, biopsied, normal stomach, normal examined duodenum.   RADIOLOGIC STUDIES:  CT scan of the head without contrast 02/17/2012 showed no acute abnormality.  CT of abdomen and pelvis without contrast 02/17/2012 revealed no acute abnormality.  No evidence of obstructing ureteral stone.  Repeated CT scan of abdomen with by mouth contrast 02/19/2012 revealed lesion in the anterior aspect of the lower third of left kidney, which is nonspecific.  Renal sonogram was recommended to evaluate for possible cyst or hypodense solid mass, status post cholecystectomy, abdominal wall defect with fat-filled hernias in the periumbilical region.  Ultrasound of kidneys bilaterally 02/19/2012 due to mass on CT scan showed a small mass in the inferior pole of left kidney which is difficult to characterize given the patient's body habitus, but most likely represents a cyst.  Follow-up renal ultrasound could be performed to ensure stability as indicated according to radiologist.  Nuclear medicine study gastric emptying study 02/20/2012 revealed a normal exam.   HOSPITAL COURSE:   1.  The patient is a 39 year old Caucasian female with history of back pains on chronic opiate therapy at home who presents to the hospital on 02/17/2012 with multiple symptoms including mainly abdominal pain, back pain as well as some nausea, intermittent vomiting and intermittent diarrhea.  She was also complaining of dysuria symptoms.  She was seen at her primary care physician at [facility], was started on Bactrim as well as Macrobid, however, did not get better.  She was seen by Emergency Room physician at [facility] who started her on ciprofloxacin, however, according to patient got worse.  She had repeated nausea, vomiting, as well as diarrhea and was brought to the hospital for further evaluation.  During her admission her blood pressure was  118/73, respiratory rate was 20, pulse was in 80s, temperature was 99.9.  Physical exam revealed abdominal discomfort on palpation which was nonlocalized.  CT scan of abdomen was unremarkable.  The patient's lab data done on 02/23/2012 revealed a high glucose level of 303, sodium 133, otherwise BMP was unremarkable.  The patient's lipase level was normal at 101.  Liver enzymes showed mild elevation of AST to 63, albumin level of 3.3.  White blood cell count was elevated to 13.6, hemoglobin was 14.3, platelet count 256.  Urine cultures revealed mixed bacterial organisms, results suggestive of contamination.  Blood cultures taken on 02/17/2012 showed no growth.  Urinalysis revealed cloudy amber-colored urine, more than 500 glucose, negative for bilirubin, trace ketones were noted, specific gravity of 1.036, pH was 5.0, 2+ blood, 30 mg/dL protein, negative for nitrites, 3+ leukocytes esterase, 48 red blood cells and 16 white blood cells, 1+ bacteria, 6 epithelial cells as well as mucus was present.  The patient was admitted to the hospital for further evaluation.  She was initiated on antibiotic initially because of suspected urinary tract infection; however, antibiotic was stopped when urine cultures came back as mixed bacterial organisms.  The patient had stool cultures also taken which were Clostridium difficile negative as well as comprehensive stool culture was also negative.  Consultation with Dr. Bluford Kaufmann was obtained and gastric emptying study was performed.  The gastric emptying study was normal.  The patient had EGD done on the day of discharge and it was normal as well.  It was unclear why patient had abdominal discomfort; however, it was felt that very likely she had an element of irritable bowel syndrome and that is the reason of her discomfort.  It was felt that she is stable to be discharged home; however, she is to follow up with a gastroenterologist for further recommendations as outpatient.  While in the  hospital she had also a few episodes of bowel movements which bleed bright red blood in her stool; however, no colonoscopy was performed as bleeding had subsided by itself.  Colonoscopy should be performed as outpatient.  The patient's hemoglobin level was checked while she was in the hospital and remained stable.   2.  In regards to back pains, the patient is to continue her outpatient management.  No other medications were given.  The patient was complaining of some headaches while in the hospital.  She had CT scan in the Emergency Room; however, it was felt that patient's headache was very likely obstructive sleep apnea related.  The patient was given also CPAP while she was in the hospital which improved her headaches.  The patient is to undergo sleep study as outpatient to qualify her for CPAP.   3.  For morbid obesity, patient was advised to lose weight.   4.  For history of diabetes mellitus, insulin dependent.  Hemoglobin A1c was checked, was found to be 11.5.  The patient's insulin Lantus was advanced to 50 units subcutaneously.  The patient was consulted by inpatient and she is to follow up with outpatient  lifestyle center for diabetes management.  5.  For her other chronic medical problems such as genital herpes, hypertension, the patient is to continue her outpatient management.    The patient is being discharged in stable condition with the above-mentioned medications and follow-up.    Her vital signs on the day of discharge:  Temperature is 99.4, pulse 83, respirations was 20, blood pressure 133/91, and saturation was 97% on room air at rest.   TIME SPENT:  40 minutes.     ____________________________ Katharina Caperima Maressa Apollo, MD rv:ea D: 02/24/2012 12:52:46 ET T: 02/25/2012 00:43:35 ET JOB#: [id number]  cc: Katharina Caperima Airelle Everding, MD, <Dictator> Marceil Welp MD ELECTRONICALLY SIGNED 03/03/2012 17:56
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
## 2014-05-01 NOTE — Consult Note (Signed)
PATIENT NAME:  Caitlyn Fuller, Caitlyn Fuller MR#:  [id number] DATE OF BIRTH:  1975/08/04  DATE OF ADMISSION:  02/17/2012  DATE OF CONSULTATION:  02/18/2012  CONSULTING PHYSICIAN:  Ezzard Standing. Keyshla Tunison, MD  REASON FOR REFERRAL:  Abdominal pain, nausea, vomiting and diarrhea.   DESCRIPTION:  The patient is a 39 year old black female who is morbidly obese, who has diabetes, which is poorly controlled. She has chronic abdominal pain and back pain and nausea, vomiting, diarrhea that has been present for years. There is a mention of gastroparesis, although she does not recall having a gastric emptying scan done to confirm this. She had an upper endoscopy and gastroscopy by me in 2012. Upper endoscopy was completely negative. The gastroscopy looked normal, but biopsy suggests some focal colitis. She did not follow up with [place] afterwards. The only thing that is different this time is that she has been having some urinary tract infection that does not respond to oral antibiotics; that is the reason why she was admitted. She was treated with Bactrim and Macrobid, then she was seen here and given Cipro, and then, because her symptoms were getting worse, she was brought in for further evaluation. The patient is a rather poor historian; therefore, it is difficult to get an adequate history. She mentioned having some evaluations done at [facility], but we do not have those records. CT scan of the abdomen and CT of the head done in the Emergency Room was negative.   REVIEW OF SYMPTOMS:  GENERAL:  There are no fevers or chills. There is some fatigue.  HEENT: There are no visual or hearing changes.  CHEST/LUNGS:  No chest pain or palpitations, coughing or shortness of breath.  GASTROINTESTINAL:  There is recurrent nausea, vomiting, abdominal pain and back pain. The rest of the review of symptoms is negative.   PAST MEDICAL HISTORY: Notable for hypertension, diabetes, morbid obesity. She also has a history of genital herpes and chronic back  pain.   PAST SURGICAL HISTORY:  Includes cholecystectomy, abdominal hernia surgeries and C-section.   SOCIAL HISTORY: She denies any alcohol or tobacco.   SOCIAL HISTORY: She lives in a trailer.   FAMILY HISTORY: Heart disease and pancreatic cancer and lung cancer and various different types of cancers.   ADMISSION MEDICATIONS:  Include metformin, lisinopril, insulin, inhalers.   ALLERGIES: MORPHINE, SEAFOOD, DARVOCET, ULTRAM AND TORADOL.   PHYSICAL EXAMINATION: GENERAL: The patient is in no acute distress.   VITAL SIGNS: She is afebrile. Vital signs are stable.  HEENT: Normocephalic, atraumatic head. Pupils are equally reactive. Throat was clear.  NECK: Supple.  CARDIAC: Regular rhythm and rate.  LUNGS: Clear bilaterally.  ABDOMEN: Normoactive bowel sounds, soft. There is some tenderness in the epigastric and upper abdomen. There is no hepatomegaly. She has active bowel sounds.  EXTREMITIES:  Show no clubbing, cyanosis or edema.   LABORATORY AND RADIOLOGIC DATA: Liver enzymes: Normal.  Hemoglobin A1c is elevated at 11.5. Sodium 139. Hemoglobin 11.2. Urinalysis does show evidence of infection with positive leukocytes and RBCs and WBCs. Blood cultures are negative so far. Liver enzymes are normal except for AST elevated at 63 on admission.   ASSESSMENT AND PLAN: This is a patient with chronic symptoms of abdominal pain, nausea, vomiting, diarrhea and back pain. She may have had some gastroenteritis. She has had essentially negative workup, except for some question of focal colitis in the past. Most of her symptoms are related to her urinary tract infection, which needs to be treated with intravenous antibiotics.  Hopefully, some of her GI symptoms will improve once the infection clears up. We need to manage her sugar levels better. If there is no clinical improvement over the next few days, we may consider repeating the upper and lower endoscopy and maybe even do a gastric emptying scan later  if it has not been done before.   Thank you for the referral.    ____________________________ Ezzard StandingPaul Y. Bluford Kaufmannh, MD pyo:dm D: 02/19/2012 09:21:00 ET T: 02/19/2012 09:33:47 ET JOB#: [id number]  cc: Ezzard StandingPaul Y. Bluford Kaufmannh, MD, <Dictator> Ezzard StandingPAUL Y Simar Pothier MD ELECTRONICALLY SIGNED 02/20/2012 15:20

## 2014-05-01 NOTE — Consult Note (Signed)
Chief Complaint:  Subjective/Chief Complaint Same old complaints. [facility]. Nause and abd pain.   VITAL SIGNS/ANCILLARY NOTES: **Vital Signs.:   10-Feb-14 06:33  Vital Signs Type Routine  Temperature Temperature (F) 98.7  Celsius 37  Temperature Source oral  Pulse Pulse 83  Respirations Respirations 18  Systolic BP Systolic BP 119  Diastolic BP (mmHg) Diastolic BP (mmHg) 77  Mean BP 91  Pulse Ox % Pulse Ox % 97  Pulse Ox Activity Level  At rest  Oxygen Delivery Room Air/ 21 %   Brief Assessment:  Cardiac Regular   Respiratory clear BS   Gastrointestinal abd tenderness   Lab Results: Routine UA:  10-Feb-14 08:28   Color (UA) Amber  Clarity (UA) Clear  Glucose (UA) Negative  Bilirubin (UA) Negative  Ketones (UA) Negative  Specific Gravity (UA) 1.015  Blood (UA) Negative  pH (UA) 5.0  Protein (UA) Negative  Nitrite (UA) Negative  Leukocyte Esterase (UA) 1+ (Result(s) reported on 19 Feb 2012 at 08:50AM.)  RBC (UA) <1 /HPF  WBC (UA) 3 /HPF  Bacteria (UA) 1+  Epithelial Cells (UA) 6 /HPF  Mucous (UA) PRESENT (Result(s) reported on 19 Feb 2012 at 08:50AM.)   Assessment/Plan:  Assessment/Plan:  Assessment N/V/D and chronic abd pain.   Plan If sxs persist, order gastric emptying scan tomorrow. May schedule EGD on Wed if patient still here. I will be out tomorrow but will check back on Wed   Electronic Signatures: Lutricia Feilh, Teasha Murrillo (MD)  (Signed 10-Feb-14 14:16)  Authored: Chief Complaint, VITAL SIGNS/ANCILLARY NOTES, Brief Assessment, Lab Results, Assessment/Plan   Last Updated: 10-Feb-14 14:16 by Lutricia Feilh, Kaius Daino (MD)

## 2014-05-01 NOTE — Consult Note (Signed)
Overall feeling better. GET neg yest. EGD today normal as well. GE junc bx taken for microscopic esophagitis. Ok for discharge later. Continue daily PPI for now until bx results are back. Can f/u in our office in few weeks. Will sign off. Thanks.  Electronic Signatures: Lutricia Feilh, Aysen Shieh (MD)  (Signed on 12-Feb-14 11:42)  Authored  Last Updated: 12-Feb-14 11:42 by Lutricia Feilh, Shalamar Crays (MD)

## 2014-05-02 NOTE — Consult Note (Signed)
PATIENT NAME:  Caitlyn Fuller, Caitlyn Fuller MR#:  [id number] DATE OF BIRTH:  January 04, 1976  DATE OF CONSULTATION:  08/02/2013  CONSULTING PHYSICIAN:  Scot Junobert T. Tyerra Loretto, MD  HISTORY OF PRESENT ILLNESS: The patient is a 39 year old female with a history of obesity, diabetes and abdominal pain. She was previously evaluated for abdominal pain by Dr. Lutricia FeilPaul Oh. She had a normal upper endoscopy on 02/21/2012 and a colonoscopy showing a small sessile polyp removed by cold biopsy on 03/13/2012. She was admitted with abdominal pain in the epigastric area, coming in waves of pain. It was going on for 24 hours, and on examination, she was having nausea and vomiting along with the pain and was admitted to the hospital. I was asked to see her in consultation for abdominal pain.   PAST MEDICAL HISTORY:  1. Poorly controlled diabetes. Hemoglobin A1c is 13.  2. Probable gastroparesis.  3. Hypertension.  4. Obesity.  5. Asthma.  6. Recurrent MRSA assesses.  7. History of genital herpes.  8. Chronic back pain.   PAST SURGICAL HISTORY:  1. Abdominal hernia surgery x2 with mesh placement.  2. Previous C-sections.  3. Status post cholecystectomy.   HABITS: Does not smoke, does not drink, denies using street drugs.   FAMILY HISTORY: Father had severe heart disease and had a heart transplant and died at age 39. Mother with lung cancer. Grandmother with pancreatic cancer.   ALLERGIES: MORPHINE, SEAFOOD, DARVOCET, ULTRAM AND TORADOL.   HOME MEDICATIONS:  1. Oxycodone 10 mg p.o. q.i.d. p.r.Fuller. for pain.  2. Lisinopril 10 mg a day.  3. Necon 50/1 one tablet daily.  4. Insulin 70/30 insulin 35 units in the morning and 25 units in the evening.  5. Valtrex 500 mg p.o. b.i.d.   REVIEW OF SYSTEMS: The patient says that every time she eats, she has some vomiting. However, she did eat breakfast this morning and kept it down without any vomiting. The patient says that she has lost 55 pounds in the last year. I do not have records  to review to confirm that. She denies throwing up any blood. No melena. She thinks she has been a diabetic for about 9 or 10 years.   I discussed possible bariatric surgery with her as an effective treatment for diabetes, and that is something she may want to look into   PHYSICAL EXAMINATION:  GENERAL: Shows a white female, tearful at times over her medical condition.  VITAL SIGNS: Temperature 97.9, pulse 76, respirations 18, blood pressure 122/75, pulse oximetry 95% on room air.  HEENT: Sclerae nonicteric. Conjunctivae negative. Tongue negative. The head is atraumatic.  CHEST: Clear.  HEART: Shows no murmurs or gallops that I can hear.  ABDOMEN: There is an umbilical hernia, mildly tender, and there are midline scars from previous C-sections and hernia surgery.  SKIN: Warm and dry.  EXTREMITIES: Showed no edema.  PSYCHIATRIC: The patient does seem somewhat depressed over her overall health.   LABORATORY DATA: CAT scan of the abdomen and pelvis shows no acute abnormality. Normal appendix. Small periumbilical hernia containing omental fat, unchanged from prior exam. Evidence of a prior ventral hernia repair with probable adhesions. There is no evidence of associated bowel obstruction at this time. White count of 15.1, hemoglobin 13.6, platelet count 253, glucose 344, BUN 11, creatinine 0.75, sodium 129, potassium 4.4, chloride 97, CO2 23, albumin 2.7. Lipase 87. Urinalysis unremarkable. Since admission, the patient has been on IV Reglan, and it has help with her nausea, vomiting and discomfort. She  had a repeat CBC that shows a white count down to 9.6, hemoglobin 12.5. Her TSH was 2.34. Hemoglobin A1c was 13.1. Her glucose is 315. C. difficile of the stool was negative. Urinalysis shows glucose, otherwise negative. On CAT scan, there were several loops of small bowel that appeared to be intimately associated with mesh repair in the anterior abdominal wall, suggesting the presence of adhesions. These  essentially are the same findings seen on CAT scan on 09/01/2012. A small periumbilical ventral hernia containing omental fat.   ASSESSMENT AND PLAN: The patient with chronic abdominal pain with obesity. By the way, her weight was 105 kg, 232 pounds. The CAT scan and the symptoms essentially have been unchanged. She may well have adhesions and scar tissue associated with her mesh, but she has no obstruction at this time. She is doing better since admission, possibly because of the Reglan. She was able to eat and keep down a significant amount of food for breakfast. Since she had been scoped twice in the last few years, both upper and lower endoscopy, I do not see the need for procedures to be repeated at this time, and I recommend that when she goes home, she goes home on Reglan 10 mg 4 times a day and follow back up with her primary doctor. I talked to her briefly about the consideration for gastric bypass as an effective treatment for diabetes. She is somewhat afraid of having more surgery.   ____________________________ Scot Jun, MD rte:lb D: 08/02/2013 10:59:07 ET T: 08/02/2013 11:24:57 ET JOB#: [id number]  cc: Scot Jun, MD, <Dictator> Richard J. Renae Gloss, MD Shreyang H. Allena Katz, MD Meindert A. Lacie Scotts, MD   Scot Jun MD ELECTRONICALLY SIGNED 08/18/2013 17:58

## 2014-05-02 NOTE — Consult Note (Signed)
Consult done, pt eating better, kept all of breakfast down this morning.  May be responding to Reglan.  Ct report reviewed, previous EGD and colonoscopy done a year ago for essentially the same clinical presentation.  Diabetes in poor control with Hgb A1c of 13.  I discussed briefly with her that bariatric surgery has a better success rate than medical treatment and dieting but she is afraid of more surgery.  Likely can be discharged on Reglan and follow up with her primary doctor.  Electronic Signatures: Scot JunElliott, Ronell Duffus T (MD)  (Signed on 25-Jul-15 11:03)  Authored  Last Updated: 25-Jul-15 11:03 by Scot JunElliott, Aqil Goetting T (MD)

## 2014-05-02 NOTE — Discharge Summary (Signed)
PATIENT NAME:  Caitlyn Fuller, Caitlyn Fuller MR#:  [id number] DATE OF BIRTH:  [birth year]  DATE OF ADMISSION:  08/01/2013 DATE OF DISCHARGE:  08/02/2013  PRIMARY CARE PHYSICIAN: [facility].  FINAL DIAGNOSES:  1.  Abdominal pain, nausea, vomiting.  2.  Uncontrolled blood diabetes.  3.  Hypertension.  4.  Obesity,   MEDICATIONS ON DISCHARGE: Include her Necon 1/50 which is a 0.05 mg 1 mg oral tablet once a day, lisinopril 10 mg daily, Valtrex 500 mg twice a day, Percocet 10/325 one tablet every 6 hours 12 day prescription until follow-up  appointment written, Novolin 70/30 at 40 units subcutaneous injection once a day with breakfast, 35 units subcutaneous injection with dinner. Reglan 10 mg 1 tablet 4 times a day with meals and bedtime, omeprazole 20 mg twice a day, Pregabalin 75 mg twice a day.   DIET: Low sodium, carbohydrate -controlled diet, regular consistency.   ACTIVITY: As tolerated.   FOLLOW UP:  I do recommend following up with an endocrinologist as an outpatient in 1- 2 weeks [facility]   HOSPITAL COURSE: The patient was admitted 08/01/2013 and discharged 08/02/2013, was admitted as an observation with abdominal pain, unclear etiology,  nausea and vomiting. The patient had a CT scan of the abdomen and pelvis that was negative. Lipase was normal, was started on IV Reglan. A GI evaluation was done by Dr. Mechele CollinElliott who did not recommend any further testing.   LABORATORY AND RADIOLOGICAL DATA: Included a urinalysis that showed greater than 500 mg/dL of glucose. Troponin negative. Lipase 87, glucose 344, BUN 11, creatinine 0.75, sodium 129, potassium 4.4, chloride 97, CO2 23, calcium 8.5. Liver function tests normal range. White blood cell count 15.1, hemoglobin and hematocrit 13.6 and 43.7, platelet count of 253,000. Venous ABG shows a pH of 7.31. Abdomen and pelvis CT scan, normal appendix. No acute findings. Small paraumbilical ventral hernia  containing omental fat, unchanged from prior examinations.  Also evidence of prior ventral hernia repair with probable adhesions. No evidence of bowel obstruction. Stable from last CAT scan. Stool for Clostridium difficile was negative. Stool comprehensive culture: No Escherichia coli or campylobacter identified. TSH 2.34. Hemoglobin A1c 13.1. Sodium upon discharge 132, creatinine 0.78. White blood cell count 9.6.   HOSPITAL COURSE PER PROBLEM LIST:  1.  Abdominal pain, nausea, and vomiting. CT scan of the abdomen and pelvis was negative. GI consult recommended. No further testing.  Patient's diet was advanced and tolerating diet. Will be discharged home. As per the patient, she needs a prescription of Percocet. She takes that 4 times a day. She missed her last follow-up  appointment and does not have any medication. Could be a reason why she is having abdominal pain, nausea, and vomiting. I did prescribe Reglan just in case this is gastroparesis. The patient states that she gets this way when her sugars are uncontrolled.   2.  Uncontrolled diabetes. Hemoglobin A1c 13.1. Her sugars have been uncontrolled for a while. I did increase her Novolin 70/30 to 40 units in the morning and 35 units in the a.m. Every 2 days she can go up 2 units on each until her sugars are less than 200. I do recommend  an outpatient Endocrine appointment.    3.  Hypertension. The patient is on lisinopril.   4.  Obesity, BMI is 45.3. Weight loss is needed.  5.  Neuropathy. I did start Lyrica.   TIME SPENT ON DISCHARGE: 40 minutes.  ____________________________ Herschell Dimes. Renae Gloss, MD rjw:ts D: 08/02/2013 15:05:33 ET T: 08/02/2013 18:08:52 ET JOB#: [id number]  cc: Herschell Dimes. Renae Gloss, MD, <Dictator> [facility] Salley Scarlet MD ELECTRONICALLY SIGNED 08/05/2013 16:18

## 2014-05-02 NOTE — H&P (Signed)
PATIENT NAME:  Caitlyn Fuller, Caitlyn Fuller MR#:  [id number] DATE OF BIRTH:  1975-08-10  DATE OF ADMISSION:  08/01/2013  PRIMARY CARE PROVIDER: [facility].  EMERGENCY DEPARTMENT REFERRING PHYSICIAN: Dr. Dorothea Glassman.   CHIEF COMPLAINT: Abdominal pain, nausea.   HISTORY OF PRESENT ILLNESS: The patient is a 39 year old white female with history of diabetes, who has previous history of admission here in 2014 with abdominal pain. At that time, she was thought to have gastroenteritis. She also has been seen at [facility] in the past and is thought to have possible gastroparesis, who last year underwent EGD, which was negative, and a colonoscopy that just showed a polyp, presents with epigastric pain that is sharp in nature. She reports that it has been going on and off for the past 24 hours. She describes the episodes coming in waves. There is no relieving or alleviating factors. Her blood sugar is under poor control based on her previous hemoglobin A1c. The patient denies any other chest pains or shortness of breath. Denies any urinary symptoms.   PAST MEDICAL HISTORY: Significant for:  1.  Poorly controlled diabetes; last hemoglobin A1c last year was 11.5.  2.  History of likely gastroparesis.  3.  Hypertension.  4.  Morbid obesity.  5.  Asthma.  6.  History of recurrent MRSA abscesses.  7.  History of genital herpes.  8.  Chronic back pain.   PAST SURGICAL HISTORY: Status post cholecystectomy, abdominal hernia surgery x 2 with mesh placement, C-section.   SOCIAL HISTORY: Does not smoke. Does not drink. No drugs.   FAMILY HISTORY:  Father has some sort of a heart dysfunction, a grandmother who has pancreatic cancer, mother had lung cancer, uncle had lymphoma.   HOME MEDICATIONS:  She is on oxycodone 10 mg p.o. q.i.d. p.r.n. for pain, lisinopril 10 mg p.o. daily, Necon 50/1, 1 tab p.o. daily; 70/30 insulin, 35 units in the morning, 25 units in the a.m.; Valtrex 500 mg 1 tab p.o. b.i.d.   ALLERGIES:  MORPHINE CAUSING TACHYCARDIA, SEAFOOD, DARVOCET, ULTRAM AND TORADOL.   REVIEW OF SYSTEMS.  CONSTITUTIONAL: Denies any fevers, chills. No weight gain or weight loss.  EYES: No blurred or double vision.    ENT:  No difficulty with hearing. No sore throat. No difficulty swallowing.  CARDIOVASCULAR: Denies any chest pains, palpitations or syncope. No arrhythmia.  PULMONARY: Denies any cough, wheezing or hemoptysis. No COPD.  GASTROINTESTINAL: Complains of abdominal pain. No hematemesis, no hematochezia.  History of gastroparesis.  GENITOURINARY: Denies any frequency, urgency or hesitancy.  MUSCULOSKELETAL: Has chronic back pain.  SKIN: No rash.  NEUROLOGIC: Denies any focal weakness. No CVA, TIA or seizures.  PSYCHIATRIC: Does have anxiety. No depression.  ENDOCRINE: Complains of polydipsia, polyuria.  HEMATOLOGIC: Denies any easy bruisability or bleeding.   PHYSICAL EXAMINATION: VITAL SIGNS: Temperature 98.4, pulse 94, respirations 20, blood pressure 124/72, O2 of 97% on room air.  GENERAL: The patient is a well-developed, well-nourished female, obese, in no acute distress. Appears anxious.  HEENT: Head atraumatic, normocephalic. Pupils equally round, reactive to light and accommodation. There is no conjunctival pallor. No sclera icterus. Nasal exam shows no drainage or ulceration.  Oropharynx is clear without any exudate.  NECK: Supple without any JVD.  CARDIOVASCULAR: Regular rate and rhythm. No murmurs, rubs, clicks or gallops.  LUNGS: Clear to auscultation bilaterally without any rales, rhonchi or wheezing.  ABDOMEN: Soft. There is tenderness. There is no guarding. No rebound. No hepatosplenomegaly.  EXTREMITIES: No clubbing, cyanosis or edema.  SKIN: No rash.  LYMPHATICS: No lymph nodes palpable.  VASCULAR: Good DP, PT pulses.  PSYCHIATRIC:  Anxious.   NEUROLOGICAL: Cranial nerves II through XII grossly intact. No focal deficits.   LABORATORY, DIAGNOSTIC AND RADIOLOGICAL DATA: CT  of the abdomen and pelvis showed no acute abnormality, status post cholecystectomy, normal appendix, small periumbilical hernia containing omental fat, unchanged compared to prior exams. There is evidence of prior ventral hernia repair with probable adhesions between the small bowel loops and overlying surgical mass in the lower abdomen. There is no evidence of associated bowel obstruction at this time, and these findings appear very similar to prior study of . WBC 15.1, hemoglobin 13.6, platelet count 253, glucose 344, BUN 11, creatinine 0.75, sodium 129, potassium 4.4, chloride 97. CO2 is 23. LFTs are normal except albumin at 2.7, lipase 87. Troponin less than 0.02. Urinalysis is negative.     ASSESSMENT AND PLAN: The patient is a 39 year old white female with history of poor controlled diabetes, morbid obesity, likely gastroparesis, presents with abdominal pain.  1. Abdominal pain, etiology unclear. She also has associated nausea, vomiting and diarrhea. This could be possibly due to gastroparesis flare. CT scan is negative. Lipase is normal. At this time, we will treat her with IV Reglan, pain control. We will ask GI to come evaluate the patient for any further recommendations.  2.  Diabetes, poor control. We will do sliding scale, continue 70/30, check a hemoglobin A1c.  3.  Hypertension.  We will continue lisinopril.  4.  Chronic back pain. We will control her pain.  5.  Leukocytosis of unclear cause. We will follow CBC. Urinalysis is negative. We will follow her temperature for any evidence of infection.  6.  Miscellaneous: The patient will be on Lovenox for DVT prophylaxis.   Time spent on this patient is 50 minutes.      ____________________________ Lacie ScottsShreyang H. Allena KatzPatel, MD shp:dmm D: 08/01/2013 19:42:58 ET T: 08/01/2013 20:26:41 ET JOB#: [id number]  cc: Meggen Spaziani H. Allena KatzPatel, MD, <Dictator> Charise CarwinSHREYANG H Vennie Salsbury MD ELECTRONICALLY SIGNED 08/09/2013 11:06

## 2014-08-20 ENCOUNTER — Emergency Department
Admission: EM | Admit: 2014-08-20 | Discharge: 2014-08-20 | Disposition: A | Discharge status: Home / Self Care | Payer: Medicaid Other | Attending: Student | Admitting: Student

## 2014-08-20 ENCOUNTER — Encounter: Payer: Self-pay | Admitting: Emergency Medicine

## 2014-08-20 ENCOUNTER — Other Ambulatory Visit: Payer: Self-pay

## 2014-08-20 ENCOUNTER — Emergency Department: Payer: Medicaid Other

## 2014-08-20 DIAGNOSIS — Z3202 Encounter for pregnancy test, result negative: Secondary | ICD-10-CM | POA: Diagnosis not present

## 2014-08-20 DIAGNOSIS — Z79891 Long term (current) use of opiate analgesic: Secondary | ICD-10-CM | POA: Diagnosis not present

## 2014-08-20 DIAGNOSIS — Z794 Long term (current) use of insulin: Secondary | ICD-10-CM | POA: Insufficient documentation

## 2014-08-20 DIAGNOSIS — E1165 Type 2 diabetes mellitus with hyperglycemia: Secondary | ICD-10-CM | POA: Diagnosis present

## 2014-08-20 DIAGNOSIS — Z79899 Other long term (current) drug therapy: Secondary | ICD-10-CM | POA: Diagnosis not present

## 2014-08-20 DIAGNOSIS — R109 Unspecified abdominal pain: Secondary | ICD-10-CM | POA: Insufficient documentation

## 2014-08-20 DIAGNOSIS — R05 Cough: Secondary | ICD-10-CM | POA: Insufficient documentation

## 2014-08-20 DIAGNOSIS — I10 Essential (primary) hypertension: Secondary | ICD-10-CM | POA: Insufficient documentation

## 2014-08-20 DIAGNOSIS — R079 Chest pain, unspecified: Secondary | ICD-10-CM | POA: Diagnosis not present

## 2014-08-20 LAB — COMPREHENSIVE METABOLIC PANEL
ALT: 37 U/L (ref 14–54)
AST: 36 U/L (ref 15–41)
Albumin: 3.4 g/dL — ABNORMAL LOW (ref 3.5–5.0)
Alkaline Phosphatase: 124 U/L (ref 38–126)
Anion gap: 11 (ref 5–15)
BILIRUBIN TOTAL: 0.3 mg/dL (ref 0.3–1.2)
BUN: 10 mg/dL (ref 6–20)
CO2: 26 mmol/L (ref 22–32)
CREATININE: 0.69 mg/dL (ref 0.44–1.00)
Calcium: 8.8 mg/dL — ABNORMAL LOW (ref 8.9–10.3)
Chloride: 95 mmol/L — ABNORMAL LOW (ref 101–111)
GFR calc Af Amer: >60 mL/min (ref >60)
GFR calc non Af Amer: >60 mL/min (ref >60)
Glucose, Bld: 482 mg/dL — ABNORMAL HIGH (ref 65–99)
POTASSIUM: 4.1 mmol/L (ref 3.5–5.1)
SODIUM: 132 mmol/L — AB (ref 135–145)
Total Protein: 7.2 g/dL (ref 6.5–8.1)

## 2014-08-20 LAB — URINALYSIS COMPLETE WITH MICROSCOPIC (ARMC ONLY)
Bilirubin Urine: NEGATIVE
Glucose, UA: >500 mg/dL — AB
Hgb urine dipstick: NEGATIVE
Ketones, ur: NEGATIVE mg/dL
LEUKOCYTES UA: NEGATIVE
NITRITE: NEGATIVE
PROTEIN: NEGATIVE mg/dL
Specific Gravity, Urine: 1.04 — ABNORMAL HIGH (ref 1.005–1.030)
pH: 5 (ref 5.0–8.0)

## 2014-08-20 LAB — LIPASE, BLOOD: LIPASE: 20 U/L — AB (ref 22–51)

## 2014-08-20 LAB — CBC WITH DIFFERENTIAL/PLATELET
BASOS PCT: 0 %
Basophils Absolute: 0 10*3/uL (ref 0–0.1)
EOS ABS: 0.1 10*3/uL (ref 0–0.7)
EOS PCT: 1 %
HCT: 46.6 % (ref 35.0–47.0)
HEMOGLOBIN: 15.2 g/dL (ref 12.0–16.0)
LYMPHS ABS: 3.3 10*3/uL (ref 1.0–3.6)
Lymphocytes Relative: 31 %
MCH: 25.4 pg — AB (ref 26.0–34.0)
MCHC: 32.5 g/dL (ref 32.0–36.0)
MCV: 78 fL — AB (ref 80.0–100.0)
Monocytes Absolute: 0.6 10*3/uL (ref 0.2–0.9)
Monocytes Relative: 6 %
Neutro Abs: 6.7 10*3/uL — ABNORMAL HIGH (ref 1.4–6.5)
Neutrophils Relative %: 62 %
PLATELETS: 215 10*3/uL (ref 150–440)
RBC: 5.97 MIL/uL — AB (ref 3.80–5.20)
RDW: 15.2 % — ABNORMAL HIGH (ref 11.5–14.5)
WBC: 10.8 10*3/uL (ref 3.6–11.0)

## 2014-08-20 LAB — TROPONIN I: Troponin I: <0.03 ng/mL (ref <0.031)

## 2014-08-20 LAB — GLUCOSE, CAPILLARY: Glucose-Capillary: 504 mg/dL — ABNORMAL HIGH (ref 65–99)

## 2014-08-20 LAB — HCG, QUANTITATIVE, PREGNANCY: HCG, BETA CHAIN, QUANT, S: 1 m[IU]/mL (ref <5)

## 2014-08-20 MED ORDER — OXYCODONE HCL 5 MG PO TABS
15.0000 mg | ORAL_TABLET | Freq: Once | ORAL | Status: AC
  Administered 2014-08-20: 15 mg via ORAL
  Filled 2014-08-20: qty 3

## 2014-08-20 MED ORDER — INSULIN NPH (HUMAN) (ISOPHANE) 100 UNIT/ML ~~LOC~~ SUSP: SUBCUTANEOUS | Status: DC

## 2014-08-20 MED ORDER — SODIUM CHLORIDE 0.9 % IV BOLUS (SEPSIS)
1000.0000 mL | Freq: Once | INTRAVENOUS | Status: AC
  Administered 2014-08-20: 1000 mL via INTRAVENOUS

## 2014-08-20 MED ORDER — INSULIN ASPART 100 UNIT/ML ~~LOC~~ SOLN
10.0000 [IU] | Freq: Once | SUBCUTANEOUS | Status: AC
  Administered 2014-08-20: 10 [IU] via SUBCUTANEOUS
  Filled 2014-08-20: qty 10

## 2014-08-20 NOTE — ED Provider Notes (Addendum)
[facility] Emergency Department Provider Note  ____________________________________________  Time seen: Approximately 7:22 PM  I have reviewed the triage vital signs and the nursing notes.   HISTORY  Chief Complaint Hyperglycemia    HPI Caitlyn Fuller is a 39 y.o. female with history of diabetes, gastroparesis, hypertension, asthma, chronic back pain who presents for evaluation of multiple complaints. Her most salient complaint is of hyperglycemia. She has been out of insulin and has not been able to get her insulin refilled for one week "because my doctor is busy and they won't see me". She reports "I feel like my whole body is falling apart, my chest hurts, my belly hurts, I feel dizzy, I've been coughing". She is unable to describe the nature of the chest pain just stating that it is constant and under her ribs bilaterally. Her complaints of abdominal pain are vague as well. She has had no fevers or chills. No diarrhea. No vomiting. Current severity of symptoms is moderate. No modifying factors though she does feel like she would feel better if she could get her insulin.   Past Medical History  Diagnosis Date  . Diabetes mellitus without complication   . Asthma   . Hernia   . Kidney infection     There are no active problems to display for this patient.   Past Surgical History  Procedure Laterality Date  . Colonscopy      Current Outpatient Rx  Name  Route  Sig  Dispense  Refill  . albuterol (PROVENTIL HFA;VENTOLIN HFA) 108 (90 BASE) MCG/ACT inhaler   Inhalation   Inhale 2 puffs into the lungs every 4 (four) hours as needed for wheezing.         Marland Kitchen ibuprofen (ADVIL,MOTRIN) 600 MG tablet   Oral   Take 600 mg by mouth 2 (two) times daily as needed.         . insulin glargine (LANTUS) 100 UNIT/ML injection   Subcutaneous   Inject into the skin at bedtime.         . insulin NPH-regular Human (NOVOLIN 70/30) (70-30) 100 UNIT/ML  injection   Subcutaneous   Inject 48 Units into the skin 2 (two) times daily with a meal. 48 units in the morning and 30 units in the evening         . lisinopril (PRINIVIL,ZESTRIL) 10 MG tablet   Oral   Take 10 mg by mouth daily.         . Norethindrone-Mestranol (NECON 1/50, 28,) 1-50 MG-MCG tablet   Oral   Take 1 tablet by mouth daily.         Marland Kitchen oxyCODONE (ROXICODONE) 15 MG immediate release tablet   Oral   Take 15 mg by mouth 5 (five) times daily.         . famotidine (PEPCID) 20 MG tablet   Oral   Take 1 tablet (20 mg total) by mouth 2 (two) times daily. Patient not taking: Reported on 08/20/2014   30 tablet   0   . EXPIRED: gabapentin (NEURONTIN) 300 MG capsule   Oral   Take 300 mg by mouth 2 (two) times daily.          . insulin NPH Human (NOVOLIN N) 100 UNIT/ML injection      Inject 25-35 Units into the skin 2 (two) times daily with a meal. 35 units in the morning and 25 units at night . Pharmacy dispense QS for 1 month. No refills.  10 mL   0     Allergies Morphine and related; Darvocet; Toradol; and Ultram  No family history on file.  Social History Social History  Substance Use Topics  . Smoking status: Never Smoker   . Smokeless tobacco: None  . Alcohol Use: No    Review of Systems Constitutional: No fever/chills Eyes: No visual changes. ENT: No sore throat. Cardiovascular: + chest pain. Respiratory: Denies shortness of breath. Gastrointestinal: +abdominal pain.  No nausea, no vomiting.  No diarrhea.  No constipation. Genitourinary: Negative for dysuria. Musculoskeletal: Negative for back pain. Skin: Negative for rash. Neurological: Negative for headaches, focal weakness or numbness.  10-point ROS otherwise negative.  ____________________________________________   PHYSICAL EXAM: Filed Vitals:   08/20/14 2045 08/20/14 2100 08/20/14 2130 08/20/14 2148  BP:  149/87 159/89 150/63  Pulse: 72 75  69  Temp:    98.1 F (36.7 C)   TempSrc:    Oral  Resp:    18  Height:      Weight:      SpO2: 94% 94%  95%    VITAL SIGNS: ED Triage Vitals  Enc Vitals Group     BP 08/20/14 1907 151/135 mmHg     Pulse Rate 08/20/14 1907 92     Resp --      Temp 08/20/14 1907 98.4 F (36.9 C)     Temp Source 08/20/14 1907 Oral     SpO2 08/20/14 1907 97 %     Weight 08/20/14 1907 232 lb (105.235 kg)     Height 08/20/14 1907 5' (1.524 m)     Head Cir --      Peak Flow --      Pain Score 08/20/14 1914 10     Pain Loc --      Pain Edu? --      Excl. in GC? --     Constitutional: Alert and oriented. Tearful but easily consoled, she does not appear to be in pain. Eyes: Conjunctivae are normal. PERRL. EOMI. Head: Atraumatic. Nose: No congestion/rhinnorhea. Mouth/Throat: Mucous membranes are moist.  Oropharynx non-erythematous. Neck: No stridor.   Cardiovascular: Normal rate, regular rhythm. Grossly normal heart sounds.  Good peripheral circulation. Respiratory: Normal respiratory effort.  No retractions. Lungs CTAB. Gastrointestinal: Soft and nontender. No distention. No abdominal bruits. No CVA tenderness. Genitourinary: deferred Musculoskeletal: No lower extremity tenderness nor edema.  No joint effusions. Neurologic:  Normal speech and language. No gross focal neurologic deficits are appreciated. No gait instability. Skin:  Skin is warm, dry and intact. No rash noted. Psychiatric: Mood and affect are normal. Speech and behavior are normal.  ____________________________________________   LABS (all labs ordered are listed, but only abnormal results are displayed)  Labs Reviewed  GLUCOSE, CAPILLARY - Abnormal; Notable for the following:    Glucose-Capillary 504 (*)    All other components within normal limits  CBC WITH DIFFERENTIAL/PLATELET - Abnormal; Notable for the following:    RBC 5.97 (*)    MCV 78.0 (*)    MCH 25.4 (*)    RDW 15.2 (*)    Neutro Abs 6.7 (*)    All other components within normal limits   COMPREHENSIVE METABOLIC PANEL - Abnormal; Notable for the following:    Sodium 132 (*)    Chloride 95 (*)    Glucose, Bld 482 (*)    Calcium 8.8 (*)    Albumin 3.4 (*)    All other components within normal limits  LIPASE, BLOOD - Abnormal; Notable  for the following:    Lipase 20 (*)    All other components within normal limits  URINALYSIS COMPLETEWITH MICROSCOPIC ([facility] ONLY) - Abnormal; Notable for the following:    Color, Urine STRAW (*)    APPearance CLEAR (*)    Glucose, UA >500 (*)    Specific Gravity, Urine 1.040 (*)    Bacteria, UA RARE (*)    Squamous Epithelial / LPF 0-5 (*)    All other components within normal limits  TROPONIN I  HCG, QUANTITATIVE, PREGNANCY   ____________________________________________  EKG  ED ECG REPORT I, Gayla Doss, the attending physician, personally viewed and interpreted this ECG.   Date: 08/20/2014  EKG Time: 20:09  Rate: 76  Rhythm: normal sinus rhythm  Axis: normal  Intervals:none  ST&T Change: No acute ST segment elevation. Q-wave in lead 3, Q waves in V2, V3 are similar to EKG on 07/26/2013.  ____________________________________________  RADIOLOGY  CXR  FINDINGS: Cardiac silhouette is mildly enlarged. Normal mediastinal and hilar contours. Clear lungs. No pleural effusion or pneumothorax.  Skeletal structures are unremarkable.  IMPRESSION: No active cardiopulmonary disease.  ____________________________________________   PROCEDURES  Procedure(s) performed: None  Critical Care performed: No  ____________________________________________   INITIAL IMPRESSION / ASSESSMENT AND PLAN / ED COURSE  Pertinent labs & imaging results that were available during my care of the patient were reviewed by me and considered in my medical decision making (see chart for details).  Deshawn Skelley Pfohl is a 39 y.o. female with history of diabetes, gastroparesis, hypertension, asthma, chronic back pain who presents for  evaluation of multiple complaints. On exam, she is tearful but easily consoled. Vital signs stable, she is afebrile. She has a benign physical examination. Labs notable for hyperglycemia was 504 on arrival which is secondary to her being off of her insulin for at least a week. We'll give IV fluids, insulin. The remainder of her complaints are very nonspecific. Chest pain sounds inconsistent with ACS, EKG similar to prior, we'll check a troponin. Not consistent with acute aortic dissection or PE. She has no abdominal tenderness but we'll obtain screening abdominal labs, urinalysis, hCG. Discussed with her that we would not provide any IV narcotics for pain. She takes 15 milligrams of oxycodone by mouth which is prescribed to her, I discussed with her that I would give her a dose of her chronic pain medicine. Reassess for disposition.  ----------------------------------------- 10:24 PM on 08/20/2014 ----------------------------------------- Labs are notable for pseudohyponatremia of hyperglycemia which corrects appropriately for her degree of hyperglycemia. No evidence to suggest DKA. At this time, her blood glucose has improved to 370. I anticipated it would have improved more at this time but I suspect she may have been eating in her room despite being asked not to. CBC without leukocytosis. Lipase not elevated. Troponin negative. CXR with no acute cardiopulmonary process. We'll discharge with return precautions, close PCP follow-up, prescriptions for her insulin. Doubt any acute life-threatening intra-thoracic or intra-abdominal process in this patient.   ____________________________________________   FINAL CLINICAL IMPRESSION(S) / ED DIAGNOSES  Final diagnoses:  Hyperglycemia due to type 2 diabetes mellitus      Gayla Doss, MD 08/20/14 2229  Gayla Doss, MD 08/20/14 2229

## 2014-08-20 NOTE — ED Notes (Signed)
Patient up to desk complaining that she is still sitting in lobby with a high blood sugar, at same time sending friends to pick up food/ cheese burger downstairs.

## 2014-08-20 NOTE — ED Notes (Signed)
Patient to Ed via [facility] for high blood sugar. Patient states she has been out of her insulin (Humulin) for a week. Her doctor will not fill it until she can get in to see them. Patient presents today with feeling of unwell, ill, dizzy, chest pain, stomach pain, rash on her legs and side, productive cough of brown phlegm and various other complaints. Patient is tearful in triage, denies fever at home. Able to speak in complete sentences.

## 2014-08-20 NOTE — ED Notes (Signed)
Pt currently calling daughter for a ride at this time

## 2014-08-21 LAB — GLUCOSE, CAPILLARY
GLUCOSE-CAPILLARY: 371 mg/dL — AB (ref 65–99)
Glucose-Capillary: 386 mg/dL — ABNORMAL HIGH (ref 65–99)

## 2014-10-24 ENCOUNTER — Other Ambulatory Visit: Payer: Self-pay

## 2014-10-24 ENCOUNTER — Emergency Department
Admission: EM | Admit: 2014-10-24 | Discharge: 2014-10-25 | Disposition: A | Discharge status: Home / Self Care | Payer: Medicaid Other | Attending: Emergency Medicine | Admitting: Emergency Medicine

## 2014-10-24 DIAGNOSIS — E1165 Type 2 diabetes mellitus with hyperglycemia: Secondary | ICD-10-CM | POA: Diagnosis not present

## 2014-10-24 DIAGNOSIS — Z792 Long term (current) use of antibiotics: Secondary | ICD-10-CM | POA: Insufficient documentation

## 2014-10-24 DIAGNOSIS — Z79899 Other long term (current) drug therapy: Secondary | ICD-10-CM | POA: Insufficient documentation

## 2014-10-24 DIAGNOSIS — R739 Hyperglycemia, unspecified: Secondary | ICD-10-CM

## 2014-10-24 DIAGNOSIS — R197 Diarrhea, unspecified: Secondary | ICD-10-CM | POA: Diagnosis not present

## 2014-10-24 DIAGNOSIS — R21 Rash and other nonspecific skin eruption: Secondary | ICD-10-CM | POA: Insufficient documentation

## 2014-10-24 DIAGNOSIS — R0600 Dyspnea, unspecified: Secondary | ICD-10-CM | POA: Diagnosis not present

## 2014-10-24 DIAGNOSIS — R079 Chest pain, unspecified: Secondary | ICD-10-CM | POA: Insufficient documentation

## 2014-10-24 DIAGNOSIS — Z3202 Encounter for pregnancy test, result negative: Secondary | ICD-10-CM | POA: Diagnosis not present

## 2014-10-24 DIAGNOSIS — Z794 Long term (current) use of insulin: Secondary | ICD-10-CM | POA: Insufficient documentation

## 2014-10-24 DIAGNOSIS — M549 Dorsalgia, unspecified: Secondary | ICD-10-CM | POA: Diagnosis not present

## 2014-10-24 DIAGNOSIS — R1084 Generalized abdominal pain: Secondary | ICD-10-CM | POA: Diagnosis not present

## 2014-10-24 NOTE — ED Notes (Signed)
Patient has had high blood sugar for last few days. Abdominal swelling and pain. Rash to right shin. Genital herpes outbreak now.

## 2014-10-25 ENCOUNTER — Emergency Department: Payer: Medicaid Other

## 2014-10-25 LAB — URINALYSIS COMPLETE WITH MICROSCOPIC (ARMC ONLY)
Bilirubin Urine: NEGATIVE
Hgb urine dipstick: NEGATIVE
Ketones, ur: NEGATIVE mg/dL
Leukocytes, UA: NEGATIVE
Nitrite: NEGATIVE
Protein, ur: NEGATIVE mg/dL
SPECIFIC GRAVITY, URINE: 1.029 (ref 1.005–1.030)
WBC, UA: NONE SEEN WBC/hpf (ref 0–5)
pH: 6 (ref 5.0–8.0)

## 2014-10-25 LAB — CBC
HCT: 42.3 % (ref 35.0–47.0)
HEMOGLOBIN: 13.7 g/dL (ref 12.0–16.0)
MCH: 26.1 pg (ref 26.0–34.0)
MCHC: 32.4 g/dL (ref 32.0–36.0)
MCV: 80.7 fL (ref 80.0–100.0)
PLATELETS: 208 10*3/uL (ref 150–440)
RBC: 5.25 MIL/uL — AB (ref 3.80–5.20)
RDW: 15.7 % — ABNORMAL HIGH (ref 11.5–14.5)
WBC: 11.2 10*3/uL — ABNORMAL HIGH (ref 3.6–11.0)

## 2014-10-25 LAB — GLUCOSE, CAPILLARY
GLUCOSE-CAPILLARY: 444 mg/dL — AB (ref 65–99)
GLUCOSE-CAPILLARY: 493 mg/dL — AB (ref 65–99)
Glucose-Capillary: 438 mg/dL — ABNORMAL HIGH (ref 65–99)
Glucose-Capillary: 381 mg/dL — ABNORMAL HIGH (ref 65–99)
Glucose-Capillary: 301 mg/dL — ABNORMAL HIGH (ref 65–99)

## 2014-10-25 LAB — TROPONIN I

## 2014-10-25 LAB — COMPREHENSIVE METABOLIC PANEL
ALT: 32 U/L (ref 14–54)
ANION GAP: 7 (ref 5–15)
AST: 26 U/L (ref 15–41)
Albumin: 2.9 g/dL — ABNORMAL LOW (ref 3.5–5.0)
Alkaline Phosphatase: 114 U/L (ref 38–126)
BILIRUBIN TOTAL: 0.4 mg/dL (ref 0.3–1.2)
BUN: 8 mg/dL (ref 6–20)
CALCIUM: 8.4 mg/dL — AB (ref 8.9–10.3)
CO2: 27 mmol/L (ref 22–32)
Chloride: 99 mmol/L — ABNORMAL LOW (ref 101–111)
Creatinine, Ser: 0.56 mg/dL (ref 0.44–1.00)
GFR calc non Af Amer: >60 mL/min (ref >60)
Glucose, Bld: 511 mg/dL (ref 65–99)
Potassium: 4.1 mmol/L (ref 3.5–5.1)
Sodium: 133 mmol/L — ABNORMAL LOW (ref 135–145)
TOTAL PROTEIN: 6 g/dL — AB (ref 6.5–8.1)

## 2014-10-25 LAB — LIPASE, BLOOD: LIPASE: 25 U/L (ref 22–51)

## 2014-10-25 LAB — PREGNANCY, URINE: Preg Test, Ur: NEGATIVE

## 2014-10-25 MED ORDER — GI COCKTAIL ~~LOC~~
30.0000 mL | Freq: Once | ORAL | Status: AC
  Administered 2014-10-25: 30 mL via ORAL
  Filled 2014-10-25: qty 30

## 2014-10-25 MED ORDER — METFORMIN HCL 500 MG PO TABS: 500.0000 mg | ORAL_TABLET | Freq: Two times a day (BID) | ORAL | Status: DC

## 2014-10-25 MED ORDER — VALACYCLOVIR HCL 500 MG PO TABS: 500.0000 mg | ORAL_TABLET | Freq: Two times a day (BID) | ORAL | Status: AC

## 2014-10-25 MED ORDER — OXYCODONE-ACETAMINOPHEN 5-325 MG PO TABS
2.0000 | ORAL_TABLET | Freq: Once | ORAL | Status: AC
  Administered 2014-10-25: 2 via ORAL
  Filled 2014-10-25: qty 2

## 2014-10-25 MED ORDER — INSULIN ASPART 100 UNIT/ML ~~LOC~~ SOLN
10.0000 [IU] | Freq: Once | SUBCUTANEOUS | Status: AC
  Administered 2014-10-25: 10 [IU] via SUBCUTANEOUS
  Filled 2014-10-25: qty 10

## 2014-10-25 MED ORDER — OXYCODONE-ACETAMINOPHEN 5-325 MG PO TABS
1.0000 | ORAL_TABLET | Freq: Once | ORAL | Status: DC
  Filled 2014-10-25: qty 1

## 2014-10-25 MED ORDER — VALACYCLOVIR HCL 500 MG PO TABS
1000.0000 mg | ORAL_TABLET | Freq: Once | ORAL | Status: AC
  Administered 2014-10-25: 1000 mg via ORAL
  Filled 2014-10-25: qty 2

## 2014-10-25 MED ORDER — IOHEXOL 240 MG/ML SOLN
25.0000 mL | Freq: Once | INTRAMUSCULAR | Status: AC | PRN
  Administered 2014-10-25: 25 mL via ORAL

## 2014-10-25 MED ORDER — SODIUM CHLORIDE 0.9 % IV BOLUS (SEPSIS)
1000.0000 mL | Freq: Once | INTRAVENOUS | Status: AC
  Administered 2014-10-25: 1000 mL via INTRAVENOUS

## 2014-10-25 MED ORDER — GABAPENTIN 100 MG PO CAPS
200.0000 mg | ORAL_CAPSULE | Freq: Once | ORAL | Status: DC
  Filled 2014-10-25: qty 2

## 2014-10-25 MED ORDER — FAMOTIDINE 40 MG PO TABS: 40.0000 mg | ORAL_TABLET | Freq: Every evening | ORAL | Status: DC

## 2014-10-25 MED ORDER — PREGABALIN 50 MG PO CAPS
50.0000 mg | ORAL_CAPSULE | Freq: Once | ORAL | Status: AC
  Administered 2014-10-25: 50 mg via ORAL
  Filled 2014-10-25: qty 1

## 2014-10-25 MED ORDER — IOHEXOL 300 MG/ML  SOLN
125.0000 mL | Freq: Once | INTRAMUSCULAR | Status: AC | PRN
  Administered 2014-10-25: 125 mL via INTRAVENOUS

## 2014-10-25 NOTE — Discharge Instructions (Signed)
Abdominal Pain, Adult Many things can cause belly (abdominal) pain. Most times, the belly pain is not dangerous. Many cases of belly pain can be watched and treated at home. HOME CARE   Do not take medicines that help you go poop (laxatives) unless told to by your doctor.  Only take medicine as told by your doctor.  Eat or drink as told by your doctor. Your doctor will tell you if you should be on a special diet. GET HELP IF:  You do not know what is causing your belly pain.  You have belly pain while you are sick to your stomach (nauseous) or have runny poop (diarrhea).  You have pain while you pee or poop.  Your belly pain wakes you up at night.  You have belly pain that gets worse or better when you eat.  You have belly pain that gets worse when you eat fatty foods.  You have a fever. GET HELP RIGHT AWAY IF:   The pain does not go away within 2 hours.  You keep throwing up (vomiting).  The pain changes and is only in the right or left part of the belly.  You have bloody or tarry looking poop. MAKE SURE YOU:   Understand these instructions.  Will watch your condition.  Will get help right away if you are not doing well or get worse.   This information is not intended to replace advice given to you by your health care provider. Make sure you discuss any questions you have with your health care provider.   Document Released: 06/14/2007 Document Revised: 01/16/2014 Document Reviewed: 09/04/2012 Elsevier Interactive Patient Education 2016 Elsevier Inc.  Back Pain, Adult Back pain is very common in adults.The cause of back pain is rarely dangerous and the pain often gets better over time.The cause of your back pain may not be known. Some common causes of back pain include:  Strain of the muscles or ligaments supporting the spine.  Wear and tear (degeneration) of the spinal disks.  Arthritis.  Direct injury to the back. For many people, back pain may return.  Since back pain is rarely dangerous, most people can learn to manage this condition on their own. HOME CARE INSTRUCTIONS Watch your back pain for any changes. The following actions may help to lessen any discomfort you are feeling:  Remain active. It is stressful on your back to sit or stand in one place for long periods of time. Do not sit, drive, or stand in one place for more than 30 minutes at a time. Take short walks on even surfaces as soon as you are able.Try to increase the length of time you walk each day.  Exercise regularly as directed by your health care provider. Exercise helps your back heal faster. It also helps avoid future injury by keeping your muscles strong and flexible.  Do not stay in bed.Resting more than 1-2 days can delay your recovery.  Pay attention to your body when you bend and lift. The most comfortable positions are those that put less stress on your recovering back. Always use proper lifting techniques, including:  Bending your knees.  Keeping the load close to your body.  Avoiding twisting.  Find a comfortable position to sleep. Use a firm mattress and lie on your side with your knees slightly bent. If you lie on your back, put a pillow under your knees.  Avoid feeling anxious or stressed.Stress increases muscle tension and can worsen back pain.It is important to  recognize when you are anxious or stressed and learn ways to manage it, such as with exercise.  Take medicines only as directed by your health care provider. Over-the-counter medicines to reduce pain and inflammation are often the most helpful.Your health care provider may prescribe muscle relaxant drugs.These medicines help dull your pain so you can more quickly return to your normal activities and healthy exercise.  Apply ice to the injured area:  Put ice in a plastic bag.  Place a towel between your skin and the bag.  Leave the ice on for 20 minutes, 2-3 times a day for the first 2-3  days. After that, ice and heat may be alternated to reduce pain and spasms.  Maintain a healthy weight. Excess weight puts extra stress on your back and makes it difficult to maintain good posture. SEEK MEDICAL CARE IF:  You have pain that is not relieved with rest or medicine.  You have increasing pain going down into the legs or buttocks.  You have pain that does not improve in one week.  You have night pain.  You lose weight.  You have a fever or chills. SEEK IMMEDIATE MEDICAL CARE IF:   You develop new bowel or bladder control problems.  You have unusual weakness or numbness in your arms or legs.  You develop nausea or vomiting.  You develop abdominal pain.  You feel faint.   This information is not intended to replace advice given to you by your health care provider. Make sure you discuss any questions you have with your health care provider.   Document Released: 12/26/2004 Document Revised: 01/16/2014 Document Reviewed: 04/29/2013 Elsevier Interactive Patient Education 2016 Elsevier Inc.  Hyperglycemia Hyperglycemia occurs when the glucose (sugar) in your blood is too high. Hyperglycemia can happen for many reasons, but it most often happens to people who do not know they have diabetes or are not managing their diabetes properly.  CAUSES  Whether you have diabetes or not, there are other causes of hyperglycemia. Hyperglycemia can occur when you have diabetes, but it can also occur in other situations that you might not be as aware of, such as: Diabetes  If you have diabetes and are having problems controlling your blood glucose, hyperglycemia could occur because of some of the following reasons:  Not following your meal plan.  Not taking your diabetes medications or not taking it properly.  Exercising less or doing less activity than you normally do.  Being sick. Pre-diabetes  This cannot be ignored. Before people develop Type 2 diabetes, they almost always  have "pre-diabetes." This is when your blood glucose levels are higher than normal, but not yet high enough to be diagnosed as diabetes. Research has shown that some long-term damage to the body, especially the heart and circulatory system, may already be occurring during pre-diabetes. If you take action to manage your blood glucose when you have pre-diabetes, you may delay or prevent Type 2 diabetes from developing. Stress  If you have diabetes, you may be "diet" controlled or on oral medications or insulin to control your diabetes. However, you may find that your blood glucose is higher than usual in the hospital whether you have diabetes or not. This is often referred to as "stress hyperglycemia." Stress can elevate your blood glucose. This happens because of hormones put out by the body during times of stress. If stress has been the cause of your high blood glucose, it can be followed regularly by your caregiver. That way he/she  can make sure your hyperglycemia does not continue to get worse or progress to diabetes. Steroids  Steroids are medications that act on the infection fighting system (immune system) to block inflammation or infection. One side effect can be a rise in blood glucose. Most people can produce enough extra insulin to allow for this rise, but for those who cannot, steroids make blood glucose levels go even higher. It is not unusual for steroid treatments to "uncover" diabetes that is developing. It is not always possible to determine if the hyperglycemia will go away after the steroids are stopped. A special blood test called an A1c is sometimes done to determine if your blood glucose was elevated before the steroids were started. SYMPTOMS  Thirsty.  Frequent urination.  Dry mouth.  Blurred vision.  Tired or fatigue.  Weakness.  Sleepy.  Tingling in feet or leg. DIAGNOSIS  Diagnosis is made by monitoring blood glucose in one or all of the following ways:  A1c test.  This is a chemical found in your blood.  Fingerstick blood glucose monitoring.  Laboratory results. TREATMENT  First, knowing the cause of the hyperglycemia is important before the hyperglycemia can be treated. Treatment may include, but is not be limited to:  Education.  Change or adjustment in medications.  Change or adjustment in meal plan.  Treatment for an illness, infection, etc.  More frequent blood glucose monitoring.  Change in exercise plan.  Decreasing or stopping steroids.  Lifestyle changes. HOME CARE INSTRUCTIONS   Test your blood glucose as directed.  Exercise regularly. Your caregiver will give you instructions about exercise. Pre-diabetes or diabetes which comes on with stress is helped by exercising.  Eat wholesome, balanced meals. Eat often and at regular, fixed times. Your caregiver or nutritionist will give you a meal plan to guide your sugar intake.  Being at an ideal weight is important. If needed, losing as little as 10 to 15 pounds may help improve blood glucose levels. SEEK MEDICAL CARE IF:   You have questions about medicine, activity, or diet.  You continue to have symptoms (problems such as increased thirst, urination, or weight gain). SEEK IMMEDIATE MEDICAL CARE IF:   You are vomiting or have diarrhea.  Your breath smells fruity.  You are breathing faster or slower.  You are very sleepy or incoherent.  You have numbness, tingling, or pain in your feet or hands.  You have chest pain.  Your symptoms get worse even though you have been following your caregiver's orders.  If you have any other questions or concerns.   This information is not intended to replace advice given to you by your health care provider. Make sure you discuss any questions you have with your health care provider.   Document Released: 06/21/2000 Document Revised: 03/20/2011 Document Reviewed: 09/01/2014 Elsevier Interactive Patient Education [employer]2016 [employer]  Inc[employer]

## 2014-10-25 NOTE — ED Notes (Signed)
Pt observed sleeping soundly. Dr. Zenda AlpersWebster at bedside to discuss testing results.

## 2014-10-25 NOTE — ED Notes (Signed)
Pt states she is allergic to gabapentin "I can't take that, it makes my throat swell. I use to take it but they took me off because it made my throat swell up." MD notified.

## 2014-10-25 NOTE — ED Provider Notes (Signed)
[facility] Emergency Department Provider Note  ____________________________________________  Time seen: Approximately 0031 AM  I have reviewed the triage vital signs and the nursing notes.   HISTORY  Chief Complaint Hyperglycemia; Abdominal Pain; and Rash    HPI Caitlyn Fuller is a 39 y.o. female who comes into the hospital today feeling as though her body is shutting down. The patient reports thather blood sugars won't stay down. The patient reports that her blood sugars up in high for the last 3 days in the 5-600. She reports that she has been taking her medicine. The patient also reports that she has not had much of an appetite and has not been eating. She also has been drinking water. She reports that her bones in her body hurts as well as some abdominal pain chest pain and back pain for the last 2 days. The patient also reports that she has shingles in her right leg and her leg is burning and hurting like crazy. The patient reports she talked her pain doctor and they think that she needs to be admitted and evaluated to get her blood sugars down. The patient reports that she is hurting all of the time and she saw her pain doctor 3 weeks ago. She reports that she had some diarrhea tonight with some occasional shortness of breath. The patient was concerned because her blood sugar was so high she decided to come into the hospital for evaluation.   Past Medical History  Diagnosis Date  . Diabetes mellitus without complication (HCC)   . Asthma   . Hernia   . Kidney infection     There are no active problems to display for this patient.   Past Surgical History  Procedure Laterality Date  . Colonscopy    . Cholecystectomy    . Cesarean section    . Abdominal surgery      Current Outpatient Rx  Name  Route  Sig  Dispense  Refill  . albuterol (PROVENTIL HFA;VENTOLIN HFA) 108 (90 BASE) MCG/ACT inhaler   Inhalation   Inhale 2 puffs into the lungs every  4 (four) hours as needed for wheezing.         Marland Kitchen. ibuprofen (ADVIL,MOTRIN) 600 MG tablet   Oral   Take 600 mg by mouth 2 (two) times daily as needed.         . insulin glargine (LANTUS) 100 UNIT/ML injection   Subcutaneous   Inject into the skin at bedtime.         . insulin NPH Human (NOVOLIN N) 100 UNIT/ML injection      Inject 25-35 Units into the skin 2 (two) times daily with a meal. 35 units in the morning and 25 units at night . Pharmacy dispense QS for 1 month. No refills.   10 mL   0   . insulin NPH-regular Human (NOVOLIN 70/30) (70-30) 100 UNIT/ML injection   Subcutaneous   Inject 48 Units into the skin 2 (two) times daily with a meal. 48 units in the morning and 30 units in the evening         . lisinopril (PRINIVIL,ZESTRIL) 10 MG tablet   Oral   Take 10 mg by mouth daily.         . Norethindrone-Mestranol (NECON 1/50, 28,) 1-50 MG-MCG tablet   Oral   Take 1 tablet by mouth daily.         Marland Kitchen. oxyCODONE (ROXICODONE) 15 MG immediate release tablet   Oral  Take 15 mg by mouth 5 (five) times daily.         . famotidine (PEPCID) 20 MG tablet   Oral   Take 1 tablet (20 mg total) by mouth 2 (two) times daily. Patient not taking: Reported on 08/20/2014   30 tablet   0   . famotidine (PEPCID) 40 MG tablet   Oral   Take 1 tablet (40 mg total) by mouth every evening.   20 tablet   0   . EXPIRED: gabapentin (NEURONTIN) 300 MG capsule   Oral   Take 300 mg by mouth 2 (two) times daily.          . metFORMIN (GLUCOPHAGE) 500 MG tablet   Oral   Take 1 tablet (500 mg total) by mouth 2 (two) times daily with a meal.   20 tablet   0   . valACYclovir (VALTREX) 500 MG tablet   Oral   Take 1 tablet (500 mg total) by mouth 2 (two) times daily.   6 tablet   0     Allergies Gabapentin; Morphine and related; Darvocet; Toradol; and Ultram  No family history on file.  Social History Social History  Substance Use Topics  . Smoking status: Never Smoker    . Smokeless tobacco: None  . Alcohol Use: No    Review of Systems Constitutional: High blood sugars No fever/chills Eyes: No visual changes. ENT: No sore throat. Cardiovascular: chest pain. Respiratory:  shortness of breath. Gastrointestinal: abdominal pain.  And diarrhea.  No constipation. Genitourinary: Negative for dysuria. Musculoskeletal:  back pain. Skin: Right leg rash Neurological: Negative for headaches, focal weakness or numbness.  10-point ROS otherwise negative.  ____________________________________________   PHYSICAL EXAM:  VITAL SIGNS: ED Triage Vitals  Enc Vitals Group     BP 10/24/14 2340 152/107 mmHg     Pulse Rate 10/24/14 2340 82     Resp 10/25/14 0000 18     Temp 10/24/14 2340 98.4 F (36.9 C)     Temp Source 10/24/14 2340 Oral     SpO2 10/24/14 2340 98 %     Weight 10/24/14 2340 232 lb (105.235 kg)     Height 10/24/14 2340 5' (1.524 m)     Head Cir --      Peak Flow --      Pain Score 10/24/14 2341 10     Pain Loc --      Pain Edu? --      Excl. in GC? --     Constitutional: Alert and oriented. Tearful and crying in some distress distress. Eyes: Conjunctivae are normal. PERRL. EOMI. Head: Atraumatic. Nose: No congestion/rhinnorhea. Mouth/Throat: Mucous membranes are moist.  Oropharynx non-erythematous. Cardiovascular: Normal rate, regular rhythm. Grossly normal heart sounds.  Good peripheral circulation. Respiratory: Normal respiratory effort.  No retractions. Lungs CTAB. Gastrointestinal: Soft with diffuse abdominal tenderness to palpation. No distention. Positive bowel sounds Musculoskeletal: No lower extremity tenderness nor edema.   Neurologic:  Normal speech and language.  Skin:  Skin is warm, dry and intact. Erythematous patchy rash to right leg that wraps from shin around to the patient's calf. Psychiatric: Mood and affect are normal.   ____________________________________________   LABS (all labs ordered are listed, but only  abnormal results are displayed)  Labs Reviewed  COMPREHENSIVE METABOLIC PANEL - Abnormal; Notable for the following:    Sodium 133 (*)    Chloride 99 (*)    Glucose, Bld 511 (*)    Calcium 8.4 (*)  Total Protein 6.0 (*)    Albumin 2.9 (*)    All other components within normal limits  CBC - Abnormal; Notable for the following:    WBC 11.2 (*)    RBC 5.25 (*)    RDW 15.7 (*)    All other components within normal limits  URINALYSIS COMPLETEWITH MICROSCOPIC ([facility] ONLY) - Abnormal; Notable for the following:    Color, Urine COLORLESS (*)    APPearance CLEAR (*)    Glucose, UA >500 (*)    Bacteria, UA RARE (*)    Squamous Epithelial / LPF 0-5 (*)    All other components within normal limits  GLUCOSE, CAPILLARY - Abnormal; Notable for the following:    Glucose-Capillary 493 (*)    All other components within normal limits  GLUCOSE, CAPILLARY - Abnormal; Notable for the following:    Glucose-Capillary 438 (*)    All other components within normal limits  GLUCOSE, CAPILLARY - Abnormal; Notable for the following:    Glucose-Capillary 444 (*)    All other components within normal limits  GLUCOSE, CAPILLARY - Abnormal; Notable for the following:    Glucose-Capillary 381 (*)    All other components within normal limits  GLUCOSE, CAPILLARY - Abnormal; Notable for the following:    Glucose-Capillary 301 (*)    All other components within normal limits  LIPASE, BLOOD  TROPONIN I  PREGNANCY, URINE  CBG MONITORING, ED   ____________________________________________  EKG  ED ECG REPORT I, Rebecka Apley, the attending physician, personally viewed and interpreted this ECG.   Date: 10/24/2014  EKG Time: 2346  Rate: 84  Rhythm: normal sinus rhythm  Axis: Normal  Intervals:none  ST&T Change: flipped t wave lead III  ____________________________________________  RADIOLOGY  CT abdomen and pelvis: Post surgical scarring in the anterior midline low pelvic abdominal wall,  periumbilical hernia containing fat, these findings are unchanged since the prior study, diffuse fatty infiltration of the liver. Enlarging cystic lesion in the left kidney now with increasing density. This may represent hemorrhagic cyst but developing cystic mass is not excluded. ____________________________________________   PROCEDURES  Procedure(s) performed: None  Critical Care performed: No  ____________________________________________   INITIAL IMPRESSION / ASSESSMENT AND PLAN / ED COURSE  Pertinent labs & imaging results that were available during my care of the patient were reviewed by me and considered in my medical decision making (see chart for details).  This is a 39 year old female with a significant medical history and multiple visits in the past who comes in today with elevated blood sugars, abdominal pain, back pain, chest pain and leg pain. On initial evaluation the patient did have a blood sugar that was over 400 and  511 on the CMP. The patient did receive 2 L of normal saline as well as 10 units of insulin subcutaneously. After the normal saline the patient's blood sugar was still in the 400s. After the insulin the patient's blood sugar was able to be decreased at 301. I did not give the patient IV pain medication as she does have a history of chronic pain and I wanted to determine if the patient had acute pain or if this abdominal and back pain was due to her chronic pain. I did give the patient a dose of Percocet 2 tabs by mouth 1 and Lyrica for her neuropathic leg pain. The patient was concerned that she did not get IV pain medicine but I informed her that the test approach would be to give her  oral medicines. The patient then complained of some burning epigastric pain for which she received a GI cocktail. I did go back into talk to the patient and she then confessed that she had not taken her insulin in over a week. She reports that she had been using her sister's insulin.  She reports that it was the same medication and should've worked fine but she does not have a prescription for her insulin. The patient also reports that her A1c is 13. I did inform the patient that I am concerned that she has not seen her physician and that her diabetes is not well controlled. I do not feel the patient needs to be admitted to the hospital but she does need to follow-up with her physician for appropriate diabetes management. I did also inform the patient of the cyst on her left kidney and told her that she does need to have it followed up by her physician as well. I will give the patient some metformin to help with her blood sugar control and have her follow back up with her primary care physician. ____________________________________________   FINAL CLINICAL IMPRESSION(S) / ED DIAGNOSES  Final diagnoses:  Hyperglycemia  Generalized abdominal pain  Back pain, unspecified location      Rebecka Apley, MD 10/25/14 [phone]

## 2014-10-25 NOTE — ED Notes (Signed)

## 2014-10-25 NOTE — ED Notes (Signed)
CBG 493

## 2014-10-25 NOTE — ED Notes (Signed)
Patient transported to X-ray 

## 2014-10-25 NOTE — ED Notes (Signed)
This RN in room to administer 1 tab Percocet, pt becoming agitated and raising voice stating "I take 15 mg Percocet at home...a 5 won't do anything for my pain, I can just go home and take my 15 mg..my doctor prescribed me 15..my leg is hurting, my back is hurting, my stomach is hurting." Informed pt this RN will speak to MD about pain medication.

## 2014-10-25 NOTE — ED Notes (Signed)
Upon this RN entry to room pt appears to be sleeping, snoring heard coming from pt. Awoke pt to inform her MD has changed pain order to give 2 tabs of Percocet, pt verbalized she will take pain medication.

## 2014-10-25 NOTE — ED Notes (Signed)
Patient transported to CT 

## 2014-10-25 NOTE — ED Notes (Signed)
Upon this RN entry to room, pt sleeping, respirations even and unlabored. Pt easily awaken. Assisted pt up to bathroom. Pt requesting something for pain, informed pt she will receive pain medication. Pt verbalized understanding.

## 2014-11-01 ENCOUNTER — Other Ambulatory Visit: Payer: Self-pay

## 2014-11-01 ENCOUNTER — Inpatient Hospital Stay (HOSPITAL_COMMUNITY)
Admission: EM | Admit: 2014-11-01 | Discharge: 2014-11-02 | DRG: 638 | Disposition: A | Discharge status: Home / Self Care | Payer: Medicaid Other | Attending: Family Medicine | Admitting: Family Medicine

## 2014-11-01 ENCOUNTER — Other Ambulatory Visit (HOSPITAL_COMMUNITY): Payer: Self-pay

## 2014-11-01 ENCOUNTER — Encounter (HOSPITAL_COMMUNITY): Payer: Self-pay | Admitting: Emergency Medicine

## 2014-11-01 DIAGNOSIS — Z794 Long term (current) use of insulin: Secondary | ICD-10-CM | POA: Diagnosis not present

## 2014-11-01 DIAGNOSIS — I160 Hypertensive urgency: Secondary | ICD-10-CM | POA: Insufficient documentation

## 2014-11-01 DIAGNOSIS — G629 Polyneuropathy, unspecified: Secondary | ICD-10-CM | POA: Diagnosis present

## 2014-11-01 DIAGNOSIS — R739 Hyperglycemia, unspecified: Secondary | ICD-10-CM | POA: Diagnosis present

## 2014-11-01 DIAGNOSIS — B029 Zoster without complications: Secondary | ICD-10-CM | POA: Diagnosis present

## 2014-11-01 DIAGNOSIS — E1165 Type 2 diabetes mellitus with hyperglycemia: Principal | ICD-10-CM | POA: Diagnosis present

## 2014-11-01 DIAGNOSIS — J45909 Unspecified asthma, uncomplicated: Secondary | ICD-10-CM | POA: Diagnosis present

## 2014-11-01 DIAGNOSIS — Z885 Allergy status to narcotic agent status: Secondary | ICD-10-CM

## 2014-11-01 DIAGNOSIS — Z886 Allergy status to analgesic agent status: Secondary | ICD-10-CM | POA: Diagnosis not present

## 2014-11-01 DIAGNOSIS — E871 Hypo-osmolality and hyponatremia: Secondary | ICD-10-CM | POA: Diagnosis present

## 2014-11-01 DIAGNOSIS — E875 Hyperkalemia: Secondary | ICD-10-CM | POA: Diagnosis present

## 2014-11-01 DIAGNOSIS — G8929 Other chronic pain: Secondary | ICD-10-CM | POA: Diagnosis present

## 2014-11-01 DIAGNOSIS — Z888 Allergy status to other drugs, medicaments and biological substances status: Secondary | ICD-10-CM | POA: Diagnosis not present

## 2014-11-01 DIAGNOSIS — I10 Essential (primary) hypertension: Secondary | ICD-10-CM | POA: Diagnosis present

## 2014-11-01 DIAGNOSIS — I161 Hypertensive emergency: Secondary | ICD-10-CM | POA: Insufficient documentation

## 2014-11-01 HISTORY — DX: Zoster without complications: B02.9

## 2014-11-01 LAB — BASIC METABOLIC PANEL
Anion gap: 8 (ref 5–15)
BUN: 8 mg/dL (ref 6–20)
CHLORIDE: 93 mmol/L — AB (ref 101–111)
CO2: 27 mmol/L (ref 22–32)
Calcium: 8.7 mg/dL — ABNORMAL LOW (ref 8.9–10.3)
Creatinine, Ser: 0.7 mg/dL (ref 0.44–1.00)
GFR calc Af Amer: >60 mL/min (ref >60)
GFR calc non Af Amer: >60 mL/min (ref >60)
Glucose, Bld: 580 mg/dL (ref 65–99)
POTASSIUM: 6 mmol/L — AB (ref 3.5–5.1)
SODIUM: 128 mmol/L — AB (ref 135–145)

## 2014-11-01 LAB — CBG MONITORING, ED: Glucose-Capillary: 549 mg/dL — ABNORMAL HIGH (ref 65–99)

## 2014-11-01 LAB — CBC
HEMATOCRIT: 44.6 % (ref 36.0–46.0)
Hemoglobin: 14.6 g/dL (ref 12.0–15.0)
MCH: 26.2 pg (ref 26.0–34.0)
MCHC: 32.7 g/dL (ref 30.0–36.0)
MCV: 79.9 fL (ref 78.0–100.0)
Platelets: 221 10*3/uL (ref 150–400)
RBC: 5.58 MIL/uL — AB (ref 3.87–5.11)
RDW: 14.7 % (ref 11.5–15.5)
WBC: 10.9 10*3/uL — AB (ref 4.0–10.5)

## 2014-11-01 LAB — URINE MICROSCOPIC-ADD ON

## 2014-11-01 LAB — URINALYSIS, ROUTINE W REFLEX MICROSCOPIC
Bilirubin Urine: NEGATIVE
Glucose, UA: >1000 mg/dL — AB
Hgb urine dipstick: NEGATIVE
Ketones, ur: NEGATIVE mg/dL
LEUKOCYTES UA: NEGATIVE
NITRITE: NEGATIVE
PH: 5 (ref 5.0–8.0)
Protein, ur: NEGATIVE mg/dL
SPECIFIC GRAVITY, URINE: 1.02 (ref 1.005–1.030)
Urobilinogen, UA: 0.2 mg/dL (ref 0.0–1.0)

## 2014-11-01 LAB — POC URINE PREG, ED: PREG TEST UR: NEGATIVE

## 2014-11-01 MED ORDER — DEXTROSE-NACL 5-0.45 % IV SOLN: INTRAVENOUS | Status: DC

## 2014-11-01 MED ORDER — SODIUM CHLORIDE 0.9 % IV SOLN
INTRAVENOUS | Status: DC
  Filled 2014-11-01: qty 2.5

## 2014-11-01 MED ORDER — SODIUM CHLORIDE 0.9 % IV BOLUS (SEPSIS)
1000.0000 mL | Freq: Once | INTRAVENOUS | Status: AC
  Administered 2014-11-01: 1000 mL via INTRAVENOUS

## 2014-11-01 MED ORDER — SODIUM CHLORIDE 0.9 % IV SOLN
1000.0000 mL | INTRAVENOUS | Status: DC
  Administered 2014-11-02 (×2): 1000 mL via INTRAVENOUS

## 2014-11-01 MED ORDER — OXYCODONE-ACETAMINOPHEN 5-325 MG PO TABS
1.0000 | ORAL_TABLET | Freq: Once | ORAL | Status: AC
  Administered 2014-11-01: 1 via ORAL
  Filled 2014-11-01: qty 1

## 2014-11-01 MED ORDER — OXYCODONE HCL 5 MG PO TABS
15.0000 mg | ORAL_TABLET | Freq: Once | ORAL | Status: AC
  Administered 2014-11-02: 15 mg via ORAL
  Filled 2014-11-01: qty 3

## 2014-11-01 MED ORDER — HYDROMORPHONE HCL 1 MG/ML IJ SOLN
1.0000 mg | Freq: Once | INTRAMUSCULAR | Status: AC
  Administered 2014-11-01: 1 mg via INTRAVENOUS
  Filled 2014-11-01: qty 1

## 2014-11-01 MED ORDER — ONDANSETRON HCL 4 MG/2ML IJ SOLN
4.0000 mg | Freq: Once | INTRAMUSCULAR | Status: AC
  Administered 2014-11-01: 4 mg via INTRAVENOUS
  Filled 2014-11-01: qty 2

## 2014-11-01 MED ORDER — SODIUM CHLORIDE 0.9 % IV SOLN
1000.0000 mL | Freq: Once | INTRAVENOUS | Status: AC
  Administered 2014-11-02: 1000 mL via INTRAVENOUS
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
## 2014-11-01 NOTE — ED Provider Notes (Signed)
CSN: [id number]     Arrival date & time 11/01/14  2111 History   First MD Initiated Contact with Patient 11/01/14 2148     Chief Complaint  Patient presents with  . Flank Pain  . Hyperglycemia     (Consider location/radiation/quality/duration/timing/severity/associated sxs/prior Treatment) HPI Comments: Patient with history of chronic back pain, on oxycodone  q6hr at home, insulin-dependent diabetes  -- presents continued abdominal pain, back pain, flank pain over the past 1-2 weeks. Abdominal pain is periumbilical (previous cholecystectomy, hernia repair with mesh, C-section), non-radiating. She has been seen at [facility] 10/16 and discharged to home, [facility] 10/18 offered admission per patient but she refused due to it being too far away. She has been taking home pain medications without relief. No N/V/D. She has had decreased appetite. No fever. No CP, SOB. No other treatments PTA. The onset of this condition was acute. The course is constant. Aggravating factors: none. Alleviating factors: none.   CT performed 10/16: Postsurgical scarring in the anterior midline low pelvic abdominal wall. Periumbilical hernia containing fat. These findings are unchanged since the prior study. Diffuse fatty infiltration of the liver. Enlarging cystic lesion in the left kidney now with increasing density. This may represent hemorrhagic cyst but developing cystic mass is not excluded. Suggest follow-up with elective MRI further evaluation.  Patient is a 39 y.o. female presenting with flank pain and hyperglycemia. The history is provided by the patient and medical records.  Flank Pain Associated symptoms include abdominal pain. Pertinent negatives include no chest pain, coughing, fever, headaches, myalgias, nausea, rash, sore throat or vomiting.  Hyperglycemia Associated symptoms: abdominal pain   Associated symptoms: no chest pain, no dysuria, no fever, no nausea and no vomiting      Past Medical History  Diagnosis Date  . Diabetes mellitus without complication (HCC)   . Asthma   . Hernia   . Kidney infection   . Shingles    Past Surgical History  Procedure Laterality Date  . Colonscopy    . Cholecystectomy    . Cesarean section    . Abdominal surgery     No family history on file. Social History  Substance Use Topics  . Smoking status: Never Smoker   . Smokeless tobacco: None  . Alcohol Use: No   OB History    No data available     Review of Systems  Constitutional: Positive for appetite change. Negative for fever.  HENT: Negative for rhinorrhea and sore throat.   Eyes: Negative for redness.  Respiratory: Negative for cough.   Cardiovascular: Negative for chest pain.  Gastrointestinal: Positive for abdominal pain. Negative for nausea, vomiting and diarrhea.  Genitourinary: Positive for flank pain. Negative for dysuria.  Musculoskeletal: Positive for back pain (chronic). Negative for myalgias.  Skin: Negative for rash.  Neurological: Negative for headaches.    Allergies  Gabapentin; Morphine and related; Darvocet; Toradol; and Ultram  Home Medications   Prior to Admission medications   Medication Sig Start Date End Date Taking? Authorizing Provider  albuterol (PROVENTIL HFA;VENTOLIN HFA) 108 (90 BASE) MCG/ACT inhaler Inhale 2 puffs into the lungs every 4 (four) hours as needed for wheezing.    Historical Provider, MD  famotidine (PEPCID) 20 MG tablet Take 1 tablet (20 mg total) by mouth 2 (two) times daily. Patient not taking: Reported on 08/20/2014 05/20/12   Sunnie Nielsen, MD  famotidine (PEPCID) 40 MG tablet Take 1 tablet (40 mg total) by mouth every evening. 10/25/14 10/25/15  Revonda Standard P  Zenda AlpersWebster, MD  gabapentin (NEURONTIN) 300 MG capsule Take 300 mg by mouth 2 (two) times daily.  09/23/12 09/23/13  Historical Provider, MD  ibuprofen (ADVIL,MOTRIN) 600 MG tablet Take 600 mg by mouth 2 (two) times daily as needed. 03/25/14   Historical Provider,  MD  insulin glargine (LANTUS) 100 UNIT/ML injection Inject into the skin at bedtime.    Historical Provider, MD  insulin NPH Human (NOVOLIN N) 100 UNIT/ML injection Inject 25-35 Units into the skin 2 (two) times daily with a meal. 35 units in the morning and 25 units at night . Pharmacy dispense QS for 1 month. No refills. 08/20/14 08/20/15  Gayla DossEryka A Gayle, MD  insulin NPH-regular Human (NOVOLIN 70/30) (70-30) 100 UNIT/ML injection Inject 48 Units into the skin 2 (two) times daily with a meal. 48 units in the morning and 30 units in the evening    Historical Provider, MD  lisinopril (PRINIVIL,ZESTRIL) 10 MG tablet Take 10 mg by mouth daily.    Historical Provider, MD  metFORMIN (GLUCOPHAGE) 500 MG tablet Take 1 tablet (500 mg total) by mouth 2 (two) times daily with a meal. 10/25/14 10/25/15  Rebecka ApleyAllison P Webster, MD  Norethindrone-Mestranol (NECON 1/50, 28,) 1-50 MG-MCG tablet Take 1 tablet by mouth daily.    Historical Provider, MD  oxyCODONE (ROXICODONE) 15 MG immediate release tablet Take 15 mg by mouth 5 (five) times daily.    Historical Provider, MD   BP 158/99 mmHg  Pulse 92  Temp(Src) 98.7 F (37.1 C) (Oral)  Resp 15  SpO2 95%   Physical Exam  Constitutional: She appears well-developed and well-nourished.  HENT:  Head: Normocephalic and atraumatic.  Mouth/Throat: Oropharynx is clear and moist.  Eyes: Conjunctivae are normal. Right eye exhibits no discharge. Left eye exhibits no discharge.  Neck: Normal range of motion. Neck supple.  Cardiovascular: Normal rate, regular rhythm and normal heart sounds.   No murmur heard. Pulmonary/Chest: Effort normal and breath sounds normal. No respiratory distress. She has no wheezes. She has no rales.  Abdominal: Soft. There is no tenderness. There is no rebound and no guarding.  Neurological: She is alert.  Skin: Skin is warm and dry.  Psychiatric: Her speech is normal. Thought content normal. Her affect is labile. She exhibits a depressed mood.   Nursing note and vitals reviewed.   ED Course  Procedures (including critical care time) Labs Review Labs Reviewed  BASIC METABOLIC PANEL - Abnormal; Notable for the following:    Sodium 128 (*)    Potassium 6.0 (*)    Chloride 93 (*)    Glucose, Bld 580 (*)    Calcium 8.7 (*)    All other components within normal limits  CBC - Abnormal; Notable for the following:    WBC 10.9 (*)    RBC 5.58 (*)    All other components within normal limits  URINALYSIS, ROUTINE W REFLEX MICROSCOPIC (NOT AT [facility]) - Abnormal; Notable for the following:    Glucose, UA >1000 (*)    All other components within normal limits  CBG MONITORING, ED - Abnormal; Notable for the following:    Glucose-Capillary 549 (*)    All other components within normal limits  CBG MONITORING, ED - Abnormal; Notable for the following:    Glucose-Capillary 415 (*)    All other components within normal limits  URINE MICROSCOPIC-ADD ON  POTASSIUM  POC URINE PREG, ED    Imaging Review No results found. I have personally reviewed and evaluated these images and lab  results as part of my medical decision-making.   EKG Interpretation None       10:08 PM Patient seen and examined. Work-up initiated.    Vital signs reviewed and are as follows: BP 158/99 mmHg  Pulse 92  Temp(Src) 98.7 F (37.1 C) (Oral)  Resp 15  SpO2 95%  12:15 AM Glucose 580. No ketosis. Will admit as this is patient's third visit in the past week for the same.   Spoke with [facility] who will admit.   MDM   Final diagnoses:  Hyperglycemia without ketosis   Admit.    Renne Crigler, PA-C 11/02/14 [phone]

## 2014-11-01 NOTE — ED Notes (Signed)
Pt states the only pain medication that works for her is dilaudid.

## 2014-11-01 NOTE — ED Notes (Signed)
Admitting MDs at bedside.

## 2014-11-01 NOTE — ED Provider Notes (Signed)
Medical screening examination/treatment/procedure(s) were conducted as a shared visit with non-physician practitioner(s) and myself.  I personally evaluated the patient during the encounter.   EKG Interpretation None      Results for orders placed or performed during the hospital encounter of 11/01/14  Basic metabolic panel  Result Value Ref Range   Sodium 128 (L) 135 - 145 mmol/L   Potassium 6.0 (H) 3.5 - 5.1 mmol/L   Chloride 93 (L) 101 - 111 mmol/L   CO2 27 22 - 32 mmol/L   Glucose, Bld 580 (HH) 65 - 99 mg/dL   BUN 8 6 - 20 mg/dL   Creatinine, Ser 5.280.70 0.44 - 1.00 mg/dL   Calcium 8.7 (L) 8.9 - 10.3 mg/dL   GFR calc non Af Amer >60 >60 mL/min   GFR calc Af Amer >60 >60 mL/min   Anion gap 8 5 - 15  CBC  Result Value Ref Range   WBC 10.9 (H) 4.0 - 10.5 K/uL   RBC 5.58 (H) 3.87 - 5.11 MIL/uL   Hemoglobin 14.6 12.0 - 15.0 g/dL   HCT 41.344.6 24.436.0 - 01.046.0 %   MCV 79.9 78.0 - 100.0 fL   MCH 26.2 26.0 - 34.0 pg   MCHC 32.7 30.0 - 36.0 g/dL   RDW 27.214.7 53.611.5 - 64.415.5 %   Platelets PENDING 150 - 400 K/uL  Urinalysis, Routine w reflex microscopic (not at [facility])  Result Value Ref Range   Color, Urine YELLOW YELLOW   APPearance CLEAR CLEAR   Specific Gravity, Urine 1.020 1.005 - 1.030   pH 5.0 5.0 - 8.0   Glucose, UA >1000 (A) NEGATIVE mg/dL   Hgb urine dipstick NEGATIVE NEGATIVE   Bilirubin Urine NEGATIVE NEGATIVE   Ketones, ur NEGATIVE NEGATIVE mg/dL   Protein, ur NEGATIVE NEGATIVE mg/dL   Urobilinogen, UA 0.2 0.0 - 1.0 mg/dL   Nitrite NEGATIVE NEGATIVE   Leukocytes, UA NEGATIVE NEGATIVE  Urine microscopic-add on  Result Value Ref Range   Squamous Epithelial / LPF RARE RARE   WBC, UA 0-2 <3 WBC/hpf   RBC / HPF 0-2 <3 RBC/hpf   Bacteria, UA RARE RARE   Urine-Other RARE YEAST   CBG monitoring, ED  Result Value Ref Range   Glucose-Capillary 549 (H) 65 - 99 mg/dL  POC urine preg, ED (not at [facility])  Result Value Ref Range   Preg Test, Ur NEGATIVE NEGATIVE    Patient with a history  of chronic pain. Patient with suppressed persistent pain here. But of note is that patient last seen at [facility] on October 15. Patient did have a CT scan on October 16 without any acute changes. Patient's blood sugars been in the 500 range on October 15 and also again today. No evidence of any metabolic acidosis. But the patient probably would benefit from admission for blood sugar control. We now have the evidence that probably set high blood sugar since October 15.   Patient's main complaint here though was the ongoing flank pain which she states it been present for 2 weeks. As I stated multiple hospital visits for the same. The main pain is on the left side. This pain we think is more for chronic baseline pain. Patient without any significant leukocytosis. Mild elevation in the white blood cell count less than 11,000.  Vanetta MuldersScott Marvina Danner, MD 11/01/14 [phone]
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
## 2014-11-02 DIAGNOSIS — F1910 Other psychoactive substance abuse, uncomplicated: Secondary | ICD-10-CM | POA: Insufficient documentation

## 2014-11-02 DIAGNOSIS — N2889 Other specified disorders of kidney and ureter: Secondary | ICD-10-CM | POA: Insufficient documentation

## 2014-11-02 DIAGNOSIS — I161 Hypertensive emergency: Secondary | ICD-10-CM | POA: Insufficient documentation

## 2014-11-02 DIAGNOSIS — I160 Hypertensive urgency: Secondary | ICD-10-CM | POA: Insufficient documentation

## 2014-11-02 DIAGNOSIS — R739 Hyperglycemia, unspecified: Secondary | ICD-10-CM

## 2014-11-02 DIAGNOSIS — G8929 Other chronic pain: Secondary | ICD-10-CM | POA: Insufficient documentation

## 2014-11-02 DIAGNOSIS — B029 Zoster without complications: Secondary | ICD-10-CM | POA: Insufficient documentation

## 2014-11-02 DIAGNOSIS — I10 Essential (primary) hypertension: Secondary | ICD-10-CM

## 2014-11-02 LAB — CBC
HEMATOCRIT: 42 % (ref 36.0–46.0)
HEMATOCRIT: 40.2 % (ref 36.0–46.0)
HEMOGLOBIN: 13.6 g/dL (ref 12.0–15.0)
HEMOGLOBIN: 12.8 g/dL (ref 12.0–15.0)
MCH: 26.3 pg (ref 26.0–34.0)
MCH: 26 pg (ref 26.0–34.0)
MCHC: 32.4 g/dL (ref 30.0–36.0)
MCHC: 31.8 g/dL (ref 30.0–36.0)
MCV: 81.1 fL (ref 78.0–100.0)
MCV: 81.7 fL (ref 78.0–100.0)
Platelets: 213 10*3/uL (ref 150–400)
Platelets: 221 10*3/uL (ref 150–400)
RBC: 5.18 MIL/uL — ABNORMAL HIGH (ref 3.87–5.11)
RBC: 4.92 MIL/uL (ref 3.87–5.11)
RDW: 14.5 % (ref 11.5–15.5)
RDW: 14.7 % (ref 11.5–15.5)
WBC: 10.1 10*3/uL (ref 4.0–10.5)
WBC: 9.6 10*3/uL (ref 4.0–10.5)

## 2014-11-02 LAB — POTASSIUM: Potassium: 4.3 mmol/L (ref 3.5–5.1)

## 2014-11-02 LAB — GLUCOSE, CAPILLARY: Glucose-Capillary: 363 mg/dL — ABNORMAL HIGH (ref 65–99)

## 2014-11-02 LAB — BASIC METABOLIC PANEL
ANION GAP: 8 (ref 5–15)
Anion gap: 7 (ref 5–15)
BUN: 7 mg/dL (ref 6–20)
BUN: 6 mg/dL (ref 6–20)
CHLORIDE: 100 mmol/L — AB (ref 101–111)
CHLORIDE: 101 mmol/L (ref 101–111)
CO2: 25 mmol/L (ref 22–32)
CO2: 26 mmol/L (ref 22–32)
Calcium: 8.2 mg/dL — ABNORMAL LOW (ref 8.9–10.3)
Calcium: 8.3 mg/dL — ABNORMAL LOW (ref 8.9–10.3)
Creatinine, Ser: 0.68 mg/dL (ref 0.44–1.00)
Creatinine, Ser: 0.59 mg/dL (ref 0.44–1.00)
GFR calc Af Amer: >60 mL/min (ref >60)
GFR calc non Af Amer: >60 mL/min (ref >60)
GFR calc non Af Amer: >60 mL/min (ref >60)
GLUCOSE: 482 mg/dL — AB (ref 65–99)
GLUCOSE: 378 mg/dL — AB (ref 65–99)
POTASSIUM: 4.3 mmol/L (ref 3.5–5.1)
Potassium: 4.1 mmol/L (ref 3.5–5.1)
Sodium: 133 mmol/L — ABNORMAL LOW (ref 135–145)
Sodium: 134 mmol/L — ABNORMAL LOW (ref 135–145)

## 2014-11-02 LAB — CBG MONITORING, ED
GLUCOSE-CAPILLARY: 399 mg/dL — AB (ref 65–99)
GLUCOSE-CAPILLARY: 415 mg/dL — AB (ref 65–99)
Glucose-Capillary: 330 mg/dL — ABNORMAL HIGH (ref 65–99)

## 2014-11-02 LAB — CREATININE, SERUM
Creatinine, Ser: 0.73 mg/dL (ref 0.44–1.00)
GFR calc Af Amer: >60 mL/min (ref >60)
GFR calc non Af Amer: >60 mL/min (ref >60)

## 2014-11-02 MED ORDER — ALBUTEROL SULFATE (2.5 MG/3ML) 0.083% IN NEBU: 2.5000 mg | INHALATION_SOLUTION | RESPIRATORY_TRACT | Status: DC | PRN

## 2014-11-02 MED ORDER — OXYCODONE HCL 5 MG PO TABS
15.0000 mg | ORAL_TABLET | ORAL | Status: DC
  Administered 2014-11-02 (×3): 15 mg via ORAL
  Filled 2014-11-02 (×3): qty 3

## 2014-11-02 MED ORDER — LISINOPRIL 10 MG PO TABS
10.0000 mg | ORAL_TABLET | Freq: Every day | ORAL | Status: DC
  Administered 2014-11-02: 10 mg via ORAL
  Filled 2014-11-02: qty 2

## 2014-11-02 MED ORDER — INSULIN ASPART PROT & ASPART (70-30 MIX) 100 UNIT/ML ~~LOC~~ SUSP
18.0000 [IU] | Freq: Every day | SUBCUTANEOUS | Status: DC
  Filled 2014-11-02: qty 10

## 2014-11-02 MED ORDER — SODIUM CHLORIDE 0.9 % IV SOLN
INTRAVENOUS | Status: AC
  Administered 2014-11-02: 02:00:00 via INTRAVENOUS

## 2014-11-02 MED ORDER — DOCUSATE SODIUM 100 MG PO CAPS
100.0000 mg | ORAL_CAPSULE | Freq: Two times a day (BID) | ORAL | Status: DC
  Administered 2014-11-02: 100 mg via ORAL
  Filled 2014-11-02: qty 1

## 2014-11-02 MED ORDER — INSULIN ASPART 100 UNIT/ML ~~LOC~~ SOLN
0.0000 [IU] | Freq: Three times a day (TID) | SUBCUTANEOUS | Status: DC
  Administered 2014-11-02: 11 [IU] via SUBCUTANEOUS
  Administered 2014-11-02: 15 [IU] via SUBCUTANEOUS
  Filled 2014-11-02: qty 1

## 2014-11-02 MED ORDER — INSULIN ASPART PROT & ASPART (70-30 MIX) 100 UNIT/ML ~~LOC~~ SUSP
25.0000 [IU] | Freq: Every day | SUBCUTANEOUS | Status: DC
  Administered 2014-11-02: 25 [IU] via SUBCUTANEOUS
  Filled 2014-11-02: qty 10

## 2014-11-02 MED ORDER — INSULIN ASPART 100 UNIT/ML ~~LOC~~ SOLN
10.0000 [IU] | Freq: Once | SUBCUTANEOUS | Status: AC
  Administered 2014-11-02: 10 [IU] via SUBCUTANEOUS
  Filled 2014-11-02: qty 1

## 2014-11-02 MED ORDER — ALBUTEROL SULFATE HFA 108 (90 BASE) MCG/ACT IN AERS
2.0000 | INHALATION_SPRAY | RESPIRATORY_TRACT | Status: DC | PRN
  Administered 2014-11-02: 2 via RESPIRATORY_TRACT
  Filled 2014-11-02: qty 6.7

## 2014-11-02 MED ORDER — ENOXAPARIN SODIUM 60 MG/0.6ML ~~LOC~~ SOLN
50.0000 mg | Freq: Every day | SUBCUTANEOUS | Status: DC
  Administered 2014-11-02: 50 mg via SUBCUTANEOUS
  Filled 2014-11-02 (×2): qty 0.6

## 2014-11-02 MED ORDER — ASPIRIN EC 81 MG PO TBEC
81.0000 mg | DELAYED_RELEASE_TABLET | Freq: Every day | ORAL | Status: DC
  Administered 2014-11-02: 81 mg via ORAL
  Filled 2014-11-02: qty 1

## 2014-11-02 MED ORDER — FAMOTIDINE 20 MG PO TABS: 40.0000 mg | ORAL_TABLET | Freq: Every evening | ORAL | Status: DC
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
## 2014-11-02 NOTE — ED Notes (Addendum)
Pt called out complaining that the room is too stuffy. This RN told the pt that the door could not be opened due to airborne precautions. This RN called facilities who states that they will try to cool down the room.

## 2014-11-02 NOTE — ED Notes (Signed)
Pt given diet coke. 

## 2014-11-02 NOTE — Discharge Instructions (Signed)
It has been a pleasure taking care of you! You were admitted due to elevated blood sugar that has now improved after we gave you insulin and fluid through your veins.  We are discharging you so that you can follow up with your primary care physician. We may have made some adjustments to your other medications. Please, make sure to read the directions before you take them. The names and directions on how to take these medications are found on this discharge paper under medication section.  You also need a follow up with your primary care doctor. Please, give them a call as soon as possible to schedule a follow up with them.  Take care,

## 2014-11-02 NOTE — ED Notes (Signed)
CBG 330 @ 0800

## 2014-11-03 LAB — HEMOGLOBIN A1C
Hgb A1c MFr Bld: 13.5 % — ABNORMAL HIGH (ref 4.8–5.6)
Mean Plasma Glucose: 341 mg/dL

## 2014-11-04 NOTE — Discharge Summary (Signed)
Family Medicine Teaching [facility] Discharge Summary  Patient name: Caitlyn Fuller Medical record number: [id number] Date of birth: 17-Apr-1975 Age: 39 y.o. Gender: female Date of Admission: 11/01/2014  Date of Discharge: 11/02/2014 Admitting Physician: Tobey Grim, MD  Primary Care Provider: [facility] Consultants: none  Indication for Hospitalization: hyperglycemia and left kidney pain  Discharge Diagnoses/Problem List:  DIABETES-1  Disposition: home  Discharge Condition: improved  Discharge Exam:  Gen: lying in bed, obese-appearing.  Eyes: PERRLA, sclera anicteric Nares: clear, no erythema, swelling or congestion Oropharynx: clear, moist CV: RRR. S1 & S2 audible, no murmurs, 2+ radial and DP pulses bilaterally. Resp: no apparent WOB, CTAB.  Abd: +BS. Soft, NDNT, no rebound or guarding.  Ext: scaly rash on right shin (L4 and L5 dermatome). See image below, no edema. Neuro: Alert and oriented, No gross focal deficits  Brief Hospital Course:  Caitlyn Fuller is a 39 y.o. female presenting with hyperglycemia and diffuse pain. PMH is significant for diabetes-2, asthma, chronic back pain, multiple abdominal surgeries (3 c/s and umblical hernia repairs), shingles and morbid obesity.   Hyperglycemia/uncontrolled DM-2: blood glucose of 580, glucosuria to >1000. Bicarb 27 and AG 8 making DKA unlikely. Serum osm of 290 which makes HHS unlikely. Patient with multiple visits to outside ED's with similar issue. Supposedly on Novolin 70/30, 38 units in am and 28 units in pm. She reports good compliance but doubt her compliance with A1c of 13.5 this time. Subjective fever but no other sign and symptoms of illness. CBC within normal limit. Blood glucose down to 415 after IVF bolus in ED. Gave 10 units of Novolog and CBG was down to 330's in the morning. She was discharged on home insulin with the instruction to follow up with her PCP for good sugar  control.  Herpes zoster: crusted lesion on her right shin. Appear to be on L4 and L5 dermatome. Per care everywhere, she was prescribed valtrex per note from her PCP office back on 10/27/2014 however unclear if she ever took this. Benefit from valtrex at this stage is unlikely. She was continue on home oxycodone, which she takes for her chronic pain.  Left kidney cystic lesion: Noted incidentally on CT abdomen from ED visit on 10/16 that was read as "enlarging cystic lesion in the left kidney now with increasing density which may represent hemorrhagic cyst but developing cystic mass is not excluded". We recommend follow-up with elective MRI as an outpatient.  Issues for Follow Up:  1. DIABETES-1: poorly controlled. A1c 13.5. Doubt compliance with her insulin 2. Left kidney cystic lesion: recommend follow-up with elective MRI as an outpatient.  Significant Procedures: none  Significant Labs and Imaging:   Recent Labs Lab 11/01/14 2130 11/02/14 0141 11/02/14 0501  WBC 10.9* 10.1 9.6  HGB 14.6 13.6 12.8  HCT 44.6 42.0 40.2  PLT 221 213 221    Recent Labs Lab 11/01/14 2130  11/02/14 0141 11/02/14 0501 11/02/14 0920  NA 128*  --   --  133* 134*  K 6.0*  < >  --  4.1 4.3  CL 93*  --   --  100* 101  CO2 27  --   --  25 26  GLUCOSE 580*  --   --  482* 378*  BUN 8  --   --  7 6  CREATININE 0.70  --  0.73 0.68 0.59  CALCIUM 8.7*  --   --  8.2* 8.3*  < > = values in  this interval not displayed.   Results/Tests Pending at Time of Discharge: none  Discharge Medications:    Medication List    STOP taking these medications        gabapentin 300 MG capsule  Commonly known as:  NEURONTIN     insulin NPH Human 100 UNIT/ML injection  Commonly known as:  NOVOLIN N      TAKE these medications        albuterol 108 (90 BASE) MCG/ACT inhaler  Commonly known as:  PROVENTIL HFA;VENTOLIN HFA  Inhale 2 puffs into the lungs every 4 (four) hours as needed for wheezing.     famotidine  20 MG tablet  Commonly known as:  PEPCID  Take 1 tablet (20 mg total) by mouth 2 (two) times daily.     ibuprofen 600 MG tablet  Commonly known as:  ADVIL,MOTRIN  Take 600 mg by mouth 3 (three) times daily.     insulin NPH-regular Human (70-30) 100 UNIT/ML injection  Commonly known as:  NOVOLIN 70/30  Inject 28-38 Units into the skin See admin instructions. 38 units in the morning and 28 units in the evening     lisinopril 10 MG tablet  Commonly known as:  PRINIVIL,ZESTRIL  Take 10 mg by mouth daily.     oxyCODONE 15 MG immediate release tablet  Commonly known as:  ROXICODONE  Take 15 mg by mouth every 4 (four) hours.        Discharge Instructions: Please refer to Patient Instructions section of EMR for full details.  Patient was counseled important signs and symptoms that should prompt return to medical care, changes in medications, dietary instructions, activity restrictions, and follow up appointments.   Follow-Up Appointments: Follow-up Information    [facility]. Schedule an appointment as soon as possible for a visit in 1 day.   Specialty:  Family Medicine   Why:  for hospital follow up.   Contact information:   [address] [place] [id number] [phone]       Almon Herculesaye T Andriana Casa, MD 11/04/2014, 1:16 AM PGY-1, [facility]

## 2014-12-27 ENCOUNTER — Emergency Department
Admission: EM | Admit: 2014-12-27 | Discharge: 2014-12-28 | Disposition: A | Discharge status: Home / Self Care | Payer: Medicaid Other | Attending: Emergency Medicine | Admitting: Emergency Medicine

## 2014-12-27 ENCOUNTER — Emergency Department: Payer: Medicaid Other

## 2014-12-27 DIAGNOSIS — Z3202 Encounter for pregnancy test, result negative: Secondary | ICD-10-CM | POA: Insufficient documentation

## 2014-12-27 DIAGNOSIS — Z794 Long term (current) use of insulin: Secondary | ICD-10-CM | POA: Diagnosis not present

## 2014-12-27 DIAGNOSIS — Z79899 Other long term (current) drug therapy: Secondary | ICD-10-CM | POA: Insufficient documentation

## 2014-12-27 DIAGNOSIS — E1165 Type 2 diabetes mellitus with hyperglycemia: Secondary | ICD-10-CM | POA: Insufficient documentation

## 2014-12-27 DIAGNOSIS — G8929 Other chronic pain: Secondary | ICD-10-CM | POA: Diagnosis not present

## 2014-12-27 DIAGNOSIS — R109 Unspecified abdominal pain: Secondary | ICD-10-CM | POA: Insufficient documentation

## 2014-12-27 DIAGNOSIS — I998 Other disorder of circulatory system: Secondary | ICD-10-CM | POA: Diagnosis not present

## 2014-12-27 DIAGNOSIS — B029 Zoster without complications: Secondary | ICD-10-CM | POA: Diagnosis not present

## 2014-12-27 DIAGNOSIS — Z791 Long term (current) use of non-steroidal anti-inflammatories (NSAID): Secondary | ICD-10-CM | POA: Diagnosis not present

## 2014-12-27 DIAGNOSIS — F17200 Nicotine dependence, unspecified, uncomplicated: Secondary | ICD-10-CM | POA: Diagnosis not present

## 2014-12-27 DIAGNOSIS — I10 Essential (primary) hypertension: Secondary | ICD-10-CM | POA: Insufficient documentation

## 2014-12-27 DIAGNOSIS — R739 Hyperglycemia, unspecified: Secondary | ICD-10-CM

## 2014-12-27 LAB — COMPREHENSIVE METABOLIC PANEL
ALBUMIN: 3.5 g/dL (ref 3.5–5.0)
ALT: 25 U/L (ref 14–54)
AST: 17 U/L (ref 15–41)
Alkaline Phosphatase: 124 U/L (ref 38–126)
Anion gap: 7 (ref 5–15)
BUN: 11 mg/dL (ref 6–20)
CHLORIDE: 99 mmol/L — AB (ref 101–111)
CO2: 26 mmol/L (ref 22–32)
CREATININE: 0.58 mg/dL (ref 0.44–1.00)
Calcium: 8.8 mg/dL — ABNORMAL LOW (ref 8.9–10.3)
GFR calc Af Amer: >60 mL/min (ref >60)
GLUCOSE: 463 mg/dL — AB (ref 65–99)
POTASSIUM: 4.3 mmol/L (ref 3.5–5.1)
Sodium: 132 mmol/L — ABNORMAL LOW (ref 135–145)
Total Bilirubin: 0.3 mg/dL (ref 0.3–1.2)
Total Protein: 7.1 g/dL (ref 6.5–8.1)

## 2014-12-27 LAB — URINALYSIS COMPLETE WITH MICROSCOPIC (ARMC ONLY)
BILIRUBIN URINE: NEGATIVE
Bacteria, UA: NONE SEEN
Hgb urine dipstick: NEGATIVE
KETONES UR: NEGATIVE mg/dL
Leukocytes, UA: NEGATIVE
Nitrite: NEGATIVE
PROTEIN: NEGATIVE mg/dL
SPECIFIC GRAVITY, URINE: 1.035 — AB (ref 1.005–1.030)
pH: 6 (ref 5.0–8.0)

## 2014-12-27 LAB — GLUCOSE, CAPILLARY
GLUCOSE-CAPILLARY: 500 mg/dL — AB (ref 65–99)
GLUCOSE-CAPILLARY: 320 mg/dL — AB (ref 65–99)
Glucose-Capillary: 351 mg/dL — ABNORMAL HIGH (ref 65–99)

## 2014-12-27 LAB — CBC WITH DIFFERENTIAL/PLATELET
Basophils Absolute: 0.1 10*3/uL (ref 0–0.1)
Basophils Relative: 1 %
EOS ABS: 0.2 10*3/uL (ref 0–0.7)
EOS PCT: 2 %
HCT: 46.6 % (ref 35.0–47.0)
Hemoglobin: 15.3 g/dL (ref 12.0–16.0)
LYMPHS ABS: 3.3 10*3/uL (ref 1.0–3.6)
LYMPHS PCT: 26 %
MCH: 26.1 pg (ref 26.0–34.0)
MCHC: 32.8 g/dL (ref 32.0–36.0)
MCV: 79.6 fL — AB (ref 80.0–100.0)
MONO ABS: 0.7 10*3/uL (ref 0.2–0.9)
MONOS PCT: 6 %
Neutro Abs: 8.3 10*3/uL — ABNORMAL HIGH (ref 1.4–6.5)
Neutrophils Relative %: 65 %
PLATELETS: 219 10*3/uL (ref 150–440)
RBC: 5.86 MIL/uL — ABNORMAL HIGH (ref 3.80–5.20)
RDW: 15.1 % — AB (ref 11.5–14.5)
WBC: 12.6 10*3/uL — AB (ref 3.6–11.0)

## 2014-12-27 LAB — POCT PREGNANCY, URINE: Preg Test, Ur: NEGATIVE

## 2014-12-27 MED ORDER — SODIUM CHLORIDE 0.9 % IV BOLUS (SEPSIS)
1000.0000 mL | Freq: Once | INTRAVENOUS | Status: AC
  Administered 2014-12-27: 1000 mL via INTRAVENOUS

## 2014-12-27 MED ORDER — INSULIN ASPART 100 UNIT/ML ~~LOC~~ SOLN
10.0000 [IU] | Freq: Once | SUBCUTANEOUS | Status: AC
  Administered 2014-12-27: 10 [IU] via INTRAVENOUS
  Filled 2014-12-27: qty 10

## 2014-12-27 NOTE — Discharge Instructions (Signed)
Please follow-up your primary care physician tomorrow regarding her elevated blood glucose readings. Please drink plenty of non-sugary fluids. Take your insulin as prescribed every day. Return to the emergency department for any personally concerning symptoms.    Hyperglycemia High blood sugar (hyperglycemia) means that the level of sugar in your blood is higher than it should be. Signs of high blood sugar include:  Feeling thirsty.  Frequent peeing (urinating).  Feeling tired or sleepy.  Dry mouth.  Vision changes.  Feeling weak.  Feeling hungry but losing weight.  Numbness and tingling in your hands or feet.  Headache. When you ignore these signs, your blood sugar may keep going up. These problems may get worse, and other problems may begin. HOME CARE  Check your blood sugars as told by your doctor. Write down the numbers with the date and time.  Take the right amount of insulin or diabetes pills at the right time. Write down the dose with date and time.  Refill your insulin or diabetes pills before running out.  Watch what you eat. Follow your meal plan.  Drink liquids without sugar, such as water. Check with your doctor if you have kidney or heart disease.  Follow your doctor's orders for exercise. Exercise at the same time of day.  Keep your doctor's appointments. GET HELP RIGHT AWAY IF:   You have trouble thinking or are confused.  You have fast breathing with fruity smelling breath.  You pass out (faint).  You have 2 to 3 days of high blood sugars and you do not know why.  You have chest pain.  You are feeling sick to your stomach (nauseous) or throwing up (vomiting).  You have sudden vision changes. MAKE SURE YOU:   Understand these instructions.  Will watch your condition.  Will get help right away if you are not doing well or get worse.   This information is not intended to replace advice given to you by your health care provider. Make sure you  discuss any questions you have with your health care provider.   Document Released: 10/23/2008 Document Revised: 01/16/2014 Document Reviewed: 09/01/2014 Elsevier Interactive Patient Education [employer]2016 [employer]

## 2014-12-27 NOTE — ED Notes (Signed)
Pt arrived via [facility]. C/O fatigue for the past couple days, when EMS arrived Glucose was 472. Pt told EMS she has not taken insulin in past 10 days. Full vile of humulin found on pt's counter by EMS. Reports pain on left kidney from cyst

## 2014-12-27 NOTE — ED Provider Notes (Addendum)
CT report returns no acute disease present. Repeat fingerstick is 320. Patient feels better although she still having some belly pain she asked me for some pain medicines that she can get some rest tonight. She wants something in the IV out of her 0.5 mg of Dilantin IV. The pain is been going on for at least 3 days she says and again is better now than it was earlier.  Arnaldo NatalPaul F Malinda, MD 12/27/14 2318  Patient understands if she gets fever vomiting or worse pain or feels sicker she is to return at once.  Arnaldo NatalPaul F Malinda, MD 12/27/14 [phone]

## 2014-12-27 NOTE — ED Provider Notes (Signed)
[facility] Emergency Department Provider Note  Time seen: 9:22 PM  I have reviewed the triage vital signs and the nursing notes.   HISTORY  Chief Complaint Hyperglycemia    HPI Caitlyn Fuller is a 39 y.o. female with a past medical history of diabetes, asthma, current shingles outbreaks right lower extremity, who presents the emergency department with elevated blood glucose. According to the patient for the past several days she has felt increased fatigue with elevated blood glucose at home and the forearm 500s. Patient has not been taking her insulin for the past 10 days as she did not have a refill, her doctor finally called one in Friday but she did not make it to the pharmacy in time, and the pharmacy does not open again until Monday per patient. Patient states urinary frequency, also states left flank pain which has been ongoing for the past one month with somewhat worse over the past 3 days.    Past Medical History  Diagnosis Date  . Diabetes mellitus without complication (HCC)   . Asthma   . Hernia   . Kidney infection   . Shingles     Patient Active Problem List   Diagnosis Date Noted  . Hyperglycemia without ketosis   . Chronic pain   . Herpes zoster   . Renal mass, left   . Essential hypertension   . Hyperglycemia 11/01/2014    Past Surgical History  Procedure Laterality Date  . Colonscopy    . Cholecystectomy    . Cesarean section    . Abdominal surgery      Current Outpatient Rx  Name  Route  Sig  Dispense  Refill  . albuterol (PROVENTIL HFA;VENTOLIN HFA) 108 (90 BASE) MCG/ACT inhaler   Inhalation   Inhale 2 puffs into the lungs every 4 (four) hours as needed for wheezing.         . famotidine (PEPCID) 20 MG tablet   Oral   Take 1 tablet (20 mg total) by mouth 2 (two) times daily. Patient not taking: Reported on 08/20/2014   30 tablet   0   . ibuprofen (ADVIL,MOTRIN) 600 MG tablet   Oral   Take 600 mg by mouth 3  (three) times daily.          . insulin NPH-regular Human (NOVOLIN 70/30) (70-30) 100 UNIT/ML injection   Subcutaneous   Inject 28-38 Units into the skin See admin instructions. 38 units in the morning and 28 units in the evening         . lisinopril (PRINIVIL,ZESTRIL) 10 MG tablet   Oral   Take 10 mg by mouth daily.         Marland Kitchen oxyCODONE (ROXICODONE) 15 MG immediate release tablet   Oral   Take 15 mg by mouth every 4 (four) hours.            Allergies Gabapentin; Morphine and related; Darvocet; Toradol; and Ultram  No family history on file.  Social History Social History  Substance Use Topics  . Smoking status: Current Every Day Smoker -- 0.50 packs/day  . Smokeless tobacco: None  . Alcohol Use: No    Review of Systems Constitutional: Negative for fever. Cardiovascular: Negative for chest pain. Respiratory: Negative for shortness of breath. Gastrointestinal: He states left-sided abdominal pain/left flank pain for the past one month. Denies nausea, vomiting, diarrhea Genitourinary: Negative for dysuria. Positive urinary frequency Musculoskeletal: Chronic pain for which she is prescribed oxycodone Neurological: Negative for headache  10-point ROS otherwise negative.  ____________________________________________   PHYSICAL EXAM:  VITAL SIGNS: ED Triage Vitals  Enc Vitals Group     BP 12/27/14 2047 151/109 mmHg     Pulse Rate 12/27/14 2047 87     Resp 12/27/14 2047 16     Temp 12/27/14 2047 98.6 F (37 C)     Temp Source 12/27/14 2047 Oral     SpO2 12/27/14 2047 97 %     Weight 12/27/14 2047 242 lb (109.77 kg)     Height 12/27/14 2047 5' (1.524 m)     Head Cir --      Peak Flow --      Pain Score 12/27/14 2054 10     Pain Loc --      Pain Edu? --      Excl. in GC? --     Constitutional: Alert and oriented. Well appearing and in no distress. Eyes: Normal exam ENT   Head: Normocephalic and atraumatic.   Mouth/Throat: Mucous membranes are  moist. Cardiovascular: Normal rate, regular rhythm. No murmur Respiratory: Normal respiratory effort without tachypnea nor retractions. Breath sounds are clear Gastrointestinal: Soft, mild suprapubic tenderness palpation. No rebound or guarding. No distention. Musculoskeletal: Nontender with normal range of motion in all extremities. Scabs/resolving shingles to the right lower extremity. Neurologic:  Normal speech and language. No gross focal neurologic deficits Skin:  Skin is warm, dry, resolving shingles as described above. Psychiatric: Mood and affect are normal. Speech and behavior are normal.  ____________________________________________     INITIAL IMPRESSION / ASSESSMENT AND PLAN / ED COURSE  Pertinent labs & imaging results that were available during my care of the patient were reviewed by me and considered in my medical decision making (see chart for details).  Patient presents with hyperglycemia, has not been taking and something past 10 days. Labs are consistent with hyperglycemia with a normal anion gap, normal potassium. We'll dose IV insulin, IV fluids in the emergency department. Patient does not currently have insulin however she states her pharmacy has a prepared and she'll pick it up tomorrow morning. Patient also states left flank pain for the past one month, no left abdominal tenderness palpation to the patient does have mild suprapubic tenderness palpation. We'll obtain a urinalysis as well as a CT renal scan to rule out ureterolithiasis or other abnormality which could've provoked her hyperglycemia. I suspect her hyperglycemia is likely due to medication noncompliance.   Patient care signed out to Dr. Darnelle CatalanMalinda  ____________________________________________   FINAL CLINICAL IMPRESSION(S) / ED DIAGNOSES  Ischemia   Minna AntisKevin Oumou Smead, MD 12/27/14 [id number]

## 2014-12-28 MED ORDER — HYDROMORPHONE HCL 1 MG/ML PO LIQD: 0.5000 mg | Freq: Once | ORAL | Status: DC

## 2014-12-28 MED ORDER — HYDROMORPHONE HCL 1 MG/ML IJ SOLN
0.5000 mg | Freq: Once | INTRAMUSCULAR | Status: AC
  Administered 2014-12-28: 0.5 mg via INTRAVENOUS
  Filled 2014-12-28: qty 1

## 2015-02-25 DIAGNOSIS — R21 Rash and other nonspecific skin eruption: Secondary | ICD-10-CM | POA: Diagnosis not present

## 2015-02-25 DIAGNOSIS — E119 Type 2 diabetes mellitus without complications: Secondary | ICD-10-CM | POA: Insufficient documentation

## 2015-02-25 DIAGNOSIS — F17200 Nicotine dependence, unspecified, uncomplicated: Secondary | ICD-10-CM | POA: Diagnosis not present

## 2015-02-25 NOTE — ED Notes (Signed)
Pt in with rash to right lower leg for few months, was dx with shingles months ago but left ama.  Rash noted to leg with scabs and co itching.

## 2015-02-26 ENCOUNTER — Emergency Department
Admission: EM | Admit: 2015-02-26 | Discharge: 2015-02-26 | Disposition: A | Discharge status: Home / Self Care | Payer: Medicaid Other | Attending: Emergency Medicine | Admitting: Emergency Medicine

## 2015-04-26 ENCOUNTER — Encounter: Payer: Self-pay | Admitting: Emergency Medicine

## 2015-04-26 ENCOUNTER — Emergency Department: Payer: Medicaid Other

## 2015-04-26 ENCOUNTER — Emergency Department
Admission: EM | Admit: 2015-04-26 | Discharge: 2015-04-26 | Disposition: A | Discharge status: Home / Self Care | Payer: Medicaid Other | Attending: Emergency Medicine | Admitting: Emergency Medicine

## 2015-04-26 DIAGNOSIS — R739 Hyperglycemia, unspecified: Secondary | ICD-10-CM

## 2015-04-26 DIAGNOSIS — I10 Essential (primary) hypertension: Secondary | ICD-10-CM | POA: Insufficient documentation

## 2015-04-26 DIAGNOSIS — G8929 Other chronic pain: Secondary | ICD-10-CM | POA: Diagnosis not present

## 2015-04-26 DIAGNOSIS — E1165 Type 2 diabetes mellitus with hyperglycemia: Secondary | ICD-10-CM | POA: Insufficient documentation

## 2015-04-26 DIAGNOSIS — Z79899 Other long term (current) drug therapy: Secondary | ICD-10-CM | POA: Insufficient documentation

## 2015-04-26 DIAGNOSIS — Z794 Long term (current) use of insulin: Secondary | ICD-10-CM | POA: Insufficient documentation

## 2015-04-26 DIAGNOSIS — F17200 Nicotine dependence, unspecified, uncomplicated: Secondary | ICD-10-CM | POA: Insufficient documentation

## 2015-04-26 DIAGNOSIS — J45909 Unspecified asthma, uncomplicated: Secondary | ICD-10-CM | POA: Insufficient documentation

## 2015-04-26 DIAGNOSIS — R079 Chest pain, unspecified: Secondary | ICD-10-CM | POA: Diagnosis not present

## 2015-04-26 LAB — BASIC METABOLIC PANEL
Anion gap: 6 (ref 5–15)
BUN: 14 mg/dL (ref 6–20)
CALCIUM: 8.7 mg/dL — AB (ref 8.9–10.3)
CHLORIDE: 96 mmol/L — AB (ref 101–111)
CO2: 29 mmol/L (ref 22–32)
CREATININE: 0.7 mg/dL (ref 0.44–1.00)
GFR calc non Af Amer: >60 mL/min (ref >60)
Glucose, Bld: 357 mg/dL — ABNORMAL HIGH (ref 65–99)
Potassium: 3.9 mmol/L (ref 3.5–5.1)
Sodium: 131 mmol/L — ABNORMAL LOW (ref 135–145)

## 2015-04-26 LAB — CBC
HCT: 43.1 % (ref 35.0–47.0)
Hemoglobin: 14.3 g/dL (ref 12.0–16.0)
MCH: 25.9 pg — AB (ref 26.0–34.0)
MCHC: 33.2 g/dL (ref 32.0–36.0)
MCV: 78 fL — AB (ref 80.0–100.0)
PLATELETS: 229 10*3/uL (ref 150–440)
RBC: 5.53 MIL/uL — ABNORMAL HIGH (ref 3.80–5.20)
RDW: 15 % — AB (ref 11.5–14.5)
WBC: 12.6 10*3/uL — ABNORMAL HIGH (ref 3.6–11.0)

## 2015-04-26 LAB — GLUCOSE, CAPILLARY: Glucose-Capillary: 376 mg/dL — ABNORMAL HIGH (ref 65–99)

## 2015-04-26 LAB — TROPONIN I

## 2015-04-26 MED ORDER — ACETAMINOPHEN 500 MG PO TABS
1000.0000 mg | ORAL_TABLET | Freq: Once | ORAL | Status: AC
  Administered 2015-04-26: 1000 mg via ORAL

## 2015-04-26 MED ORDER — SODIUM CHLORIDE 0.9 % IV SOLN
1000.0000 mL | Freq: Once | INTRAVENOUS | Status: AC
  Administered 2015-04-26: 1000 mL via INTRAVENOUS

## 2015-04-26 MED ORDER — ACETAMINOPHEN 500 MG PO TABS
ORAL_TABLET | ORAL | Status: AC
  Filled 2015-04-26: qty 2

## 2015-04-26 MED ORDER — NAPROXEN 500 MG PO TABS: 500.0000 mg | ORAL_TABLET | Freq: Two times a day (BID) | ORAL | Status: DC

## 2015-04-26 NOTE — Discharge Instructions (Signed)

## 2015-04-26 NOTE — ED Provider Notes (Signed)
[facility] Emergency Department Provider Note  ____________________________________________    I have reviewed the triage vital signs and the nursing notes.   HISTORY  Chief Complaint Chest Pain    HPI Caitlyn Fuller is a 40 y.o. female who presents with complaints of chest pain. She reports spasming and tenderness to palpation just superior to her right breast. She reports this started overnight. She denies shortness of breath. She denies a history of heart disease. She does have history of diabetes and asthma. No fevers or chills. No cough. No hemoptysis. No pleurisy. No recent travel or calf pain or swelling. She does report her sugars have been out of control this week. No diaphoresis.     Past Medical History  Diagnosis Date  . Diabetes mellitus without complication (HCC)   . Asthma   . Hernia   . Kidney infection   . Shingles     Patient Active Problem List   Diagnosis Date Noted  . Hyperglycemia without ketosis   . Chronic pain   . Herpes zoster   . Renal mass, left   . Essential hypertension   . Hyperglycemia 11/01/2014    Past Surgical History  Procedure Laterality Date  . Colonscopy    . Cholecystectomy    . Cesarean section    . Abdominal surgery      Current Outpatient Rx  Name  Route  Sig  Dispense  Refill  . albuterol (PROVENTIL HFA;VENTOLIN HFA) 108 (90 BASE) MCG/ACT inhaler   Inhalation   Inhale 2 puffs into the lungs every 4 (four) hours as needed for wheezing.         . famotidine (PEPCID) 20 MG tablet   Oral   Take 1 tablet (20 mg total) by mouth 2 (two) times daily. Patient not taking: Reported on 08/20/2014   30 tablet   0   . ibuprofen (ADVIL,MOTRIN) 600 MG tablet   Oral   Take 600 mg by mouth 3 (three) times daily.          . insulin NPH-regular Human (NOVOLIN 70/30) (70-30) 100 UNIT/ML injection   Subcutaneous   Inject 28-38 Units into the skin See admin instructions. 38 units in the morning  and 28 units in the evening         . lisinopril (PRINIVIL,ZESTRIL) 10 MG tablet   Oral   Take 10 mg by mouth daily.         Marland Kitchen oxyCODONE (ROXICODONE) 15 MG immediate release tablet   Oral   Take 15 mg by mouth every 4 (four) hours.            Allergies Gabapentin; Morphine and related; Darvocet; Toradol; and Ultram  No family history on file.  Social History Social History  Substance Use Topics  . Smoking status: Current Every Day Smoker -- 0.50 packs/day  . Smokeless tobacco: None  . Alcohol Use: No    Review of Systems  Constitutional: Negative for fever. Eyes: Negative for redness ENT: Negative for sore throat Cardiovascular: As above Respiratory: Negative for shortness of breath. Gastrointestinal: Negative for abdominal pain Genitourinary: Negative for dysuria. Musculoskeletal: Negative for back pain. Skin: Negative for rash. Neurological: Negative for focal weakness Psychiatric: Positive for anxiety    ____________________________________________   PHYSICAL EXAM:  VITAL SIGNS: ED Triage Vitals  Enc Vitals Group     BP 04/26/15 0822 127/84 mmHg     Pulse Rate 04/26/15 0822 80     Resp 04/26/15 0822 20  Temp 04/26/15 0822 98.8 F (37.1 C)     Temp Source 04/26/15 0822 Oral     SpO2 04/26/15 0822 96 %     Weight 04/26/15 0822 232 lb (105.235 kg)     Height 04/26/15 0822 5' (1.524 m)     Head Cir --      Peak Flow --      Pain Score 04/26/15 0822 10     Pain Loc --      Pain Edu? --      Excl. in GC? --      Constitutional: Alert and oriented. No acute distress, anxious Eyes: Conjunctivae are normal. No erythema or injection ENT   Head: Normocephalic and atraumatic.   Mouth/Throat: Mucous membranes are moist. Cardiovascular: Normal rate, regular rhythm. Normal and symmetric distal pulses are present in the upper extremities. No murmurs or rubs . Tenderness to palpation along the right pectoralis major muscle that completely  reduces her pain. Respiratory: Normal respiratory effort without tachypnea nor retractions. Breath sounds are clear and equal bilaterally.  Gastrointestinal: Soft and non-tender in all quadrants. No distention. There is no CVA tenderness. Genitourinary: deferred Musculoskeletal: Nontender with normal range of motion in all extremities. No lower extremity tenderness nor edema. Neurologic:  Normal speech and language. No gross focal neurologic deficits are appreciated. Skin:  Skin is warm, dry and intact. No rash noted. Psychiatric: Mood and affect are normal. Patient exhibits appropriate insight and judgment.  ____________________________________________    LABS (pertinent positives/negatives)  Labs Reviewed  CBC - Abnormal; Notable for the following:    WBC 12.6 (*)    RBC 5.53 (*)    MCV 78.0 (*)    MCH 25.9 (*)    RDW 15.0 (*)    All other components within normal limits  BASIC METABOLIC PANEL  TROPONIN I    ____________________________________________   EKG  ED ECG REPORT I, Jene EveryKINNER, Nephi Savage, the attending physician, personally viewed and interpreted this ECG.  Date: 04/26/2015 EKG Time: 8:17 AM Rate: 85 Rhythm: normal sinus rhythm QRS Axis: normal Intervals: normal ST/T Wave abnormalities: normal Conduction Disturbances: none Narrative Interpretation: unremarkable   ____________________________________________    RADIOLOGY  Chest x-ray unremarkable  ____________________________________________   PROCEDURES  Procedure(s) performed: none  Critical Care performed: none  ____________________________________________   INITIAL IMPRESSION / ASSESSMENT AND PLAN / ED COURSE  Pertinent labs & imaging results that were available during my care of the patient were reviewed by me and considered in my medical decision making (see chart for details).  Patient presents with chest pain and hyperglycemia. Her chest pain is almost certainly musculoskeletal given  her acute tenderness to palpation along the pectoralis muscle. Her EKG is reassuring. We will provide analgesia and check enzymes and chest x-ray and reevaluate.  Lab work is overall reassuring. Elevated glucose treated with IV fluids   Discussed with patient lab work and x-ray read she is feeling better. She will follow-up with her PCP this week she is to return if worsening symptoms or change in symptoms. ____________________________________________   FINAL CLINICAL IMPRESSION(S) / ED DIAGNOSES  Final diagnoses:  Chest pain, unspecified chest pain type  Hyperglycemia          Jene Everyobert Roshana Shuffield, MD 04/26/15 1234

## 2015-04-26 NOTE — ED Notes (Signed)
To took the tylenol and is trying a warm blanket on right lower chest to help her pain.

## 2015-06-13 ENCOUNTER — Emergency Department
Admission: EM | Admit: 2015-06-13 | Discharge: 2015-06-13 | Disposition: A | Discharge status: Home / Self Care | Payer: Medicaid Other | Attending: Emergency Medicine | Admitting: Emergency Medicine

## 2015-06-13 DIAGNOSIS — H1032 Unspecified acute conjunctivitis, left eye: Secondary | ICD-10-CM | POA: Insufficient documentation

## 2015-06-13 DIAGNOSIS — J45909 Unspecified asthma, uncomplicated: Secondary | ICD-10-CM | POA: Diagnosis not present

## 2015-06-13 DIAGNOSIS — Z79899 Other long term (current) drug therapy: Secondary | ICD-10-CM | POA: Insufficient documentation

## 2015-06-13 DIAGNOSIS — I10 Essential (primary) hypertension: Secondary | ICD-10-CM | POA: Insufficient documentation

## 2015-06-13 DIAGNOSIS — Z794 Long term (current) use of insulin: Secondary | ICD-10-CM | POA: Insufficient documentation

## 2015-06-13 DIAGNOSIS — H5712 Ocular pain, left eye: Secondary | ICD-10-CM | POA: Diagnosis present

## 2015-06-13 DIAGNOSIS — H00016 Hordeolum externum left eye, unspecified eyelid: Secondary | ICD-10-CM | POA: Insufficient documentation

## 2015-06-13 DIAGNOSIS — E1165 Type 2 diabetes mellitus with hyperglycemia: Secondary | ICD-10-CM | POA: Insufficient documentation

## 2015-06-13 DIAGNOSIS — F17200 Nicotine dependence, unspecified, uncomplicated: Secondary | ICD-10-CM | POA: Insufficient documentation

## 2015-06-13 DIAGNOSIS — H9202 Otalgia, left ear: Secondary | ICD-10-CM | POA: Insufficient documentation

## 2015-06-13 DIAGNOSIS — R739 Hyperglycemia, unspecified: Secondary | ICD-10-CM

## 2015-06-13 DIAGNOSIS — H109 Unspecified conjunctivitis: Secondary | ICD-10-CM

## 2015-06-13 LAB — BASIC METABOLIC PANEL
Anion gap: 6 (ref 5–15)
BUN: 10 mg/dL (ref 6–20)
CHLORIDE: 100 mmol/L — AB (ref 101–111)
CO2: 26 mmol/L (ref 22–32)
CREATININE: 0.6 mg/dL (ref 0.44–1.00)
Calcium: 8.8 mg/dL — ABNORMAL LOW (ref 8.9–10.3)
GFR calc Af Amer: >60 mL/min (ref >60)
GFR calc non Af Amer: >60 mL/min (ref >60)
GLUCOSE: 278 mg/dL — AB (ref 65–99)
POTASSIUM: 4.2 mmol/L (ref 3.5–5.1)
Sodium: 132 mmol/L — ABNORMAL LOW (ref 135–145)

## 2015-06-13 LAB — POCT PREGNANCY, URINE: PREG TEST UR: NEGATIVE

## 2015-06-13 LAB — URINALYSIS COMPLETE WITH MICROSCOPIC (ARMC ONLY)
BILIRUBIN URINE: NEGATIVE
Glucose, UA: 150 mg/dL — AB
HGB URINE DIPSTICK: NEGATIVE
Ketones, ur: NEGATIVE mg/dL
Leukocytes, UA: NEGATIVE
Nitrite: NEGATIVE
PH: 5 (ref 5.0–8.0)
PROTEIN: NEGATIVE mg/dL
Specific Gravity, Urine: 1.018 (ref 1.005–1.030)

## 2015-06-13 LAB — CBC
HEMATOCRIT: 45 % (ref 35.0–47.0)
Hemoglobin: 14.7 g/dL (ref 12.0–16.0)
MCH: 25.8 pg — AB (ref 26.0–34.0)
MCHC: 32.8 g/dL (ref 32.0–36.0)
MCV: 78.7 fL — AB (ref 80.0–100.0)
PLATELETS: 223 10*3/uL (ref 150–440)
RBC: 5.71 MIL/uL — ABNORMAL HIGH (ref 3.80–5.20)
RDW: 15.5 % — AB (ref 11.5–14.5)
WBC: 13.3 10*3/uL — ABNORMAL HIGH (ref 3.6–11.0)

## 2015-06-13 LAB — GLUCOSE, CAPILLARY: Glucose-Capillary: 275 mg/dL — ABNORMAL HIGH (ref 65–99)

## 2015-06-13 MED ORDER — ERYTHROMYCIN 5 MG/GM OP OINT
TOPICAL_OINTMENT | Freq: Once | OPHTHALMIC | Status: AC
  Administered 2015-06-13: 1 via OPHTHALMIC
  Filled 2015-06-13: qty 1

## 2015-06-13 MED ORDER — ERYTHROMYCIN 5 MG/GM OP OINT: 1.0000 "application " | TOPICAL_OINTMENT | Freq: Three times a day (TID) | OPHTHALMIC | Status: AC

## 2015-06-13 MED ORDER — INSULIN NPH ISOPHANE & REGULAR (70-30) 100 UNIT/ML ~~LOC~~ SUSP: SUBCUTANEOUS | Status: DC

## 2015-06-13 NOTE — ED Notes (Signed)
Pt reports she has been out of her insulin for about 10 days. Reports tonight at home her blood sugar was 386. Reports she has a "pus pocket" on the inside of her left eye and pain to her left ear.

## 2015-06-13 NOTE — Discharge Instructions (Signed)
Bacterial Conjunctivitis Bacterial conjunctivitis, commonly called pink eye, is an inflammation of the clear membrane that covers the white part of the eye (conjunctiva). The inflammation can also happen on the underside of the eyelids. The blood vessels in the conjunctiva become inflamed, causing the eye to become red or pink. Bacterial conjunctivitis may spread easily from one eye to another and from person to person (contagious).  CAUSES  Bacterial conjunctivitis is caused by bacteria. The bacteria may come from your own skin, your upper respiratory tract, or from someone else with bacterial conjunctivitis. SYMPTOMS  The normally white color of the eye or the underside of the eyelid is usually pink or red. The pink eye is usually associated with irritation, tearing, and some sensitivity to light. Bacterial conjunctivitis is often associated with a thick, yellowish discharge from the eye. The discharge may turn into a crust on the eyelids overnight, which causes your eyelids to stick together. If a discharge is present, there may also be some blurred vision in the affected eye. DIAGNOSIS  Bacterial conjunctivitis is diagnosed by your caregiver through an eye exam and the symptoms that you report. Your caregiver looks for changes in the surface tissues of your eyes, which may point to the specific type of conjunctivitis. A sample of any discharge may be collected on a cotton-tip swab if you have a severe case of conjunctivitis, if your cornea is affected, or if you keep getting repeat infections that do not respond to treatment. The sample will be sent to a lab to see if the inflammation is caused by a bacterial infection and to see if the infection will respond to antibiotic medicines. TREATMENT   Bacterial conjunctivitis is treated with antibiotics. Antibiotic eyedrops are most often used. However, antibiotic ointments are also available. Antibiotics pills are sometimes used. Artificial tears or eye  washes may ease discomfort. HOME CARE INSTRUCTIONS   To ease discomfort, apply a cool, clean washcloth to your eye for 10-20 minutes, 3-4 times a day.  Gently wipe away any drainage from your eye with a warm, wet washcloth or a cotton ball.  Wash your hands often with soap and water. Use paper towels to dry your hands.  Do not share towels or washcloths. This may spread the infection.  Change or wash your pillowcase every day.  You should not use eye makeup until the infection is gone.  Do not operate machinery or drive if your vision is blurred.  Stop using contact lenses. Ask your caregiver how to sterilize or replace your contacts before using them again. This depends on the type of contact lenses that you use.  When applying medicine to the infected eye, do not touch the edge of your eyelid with the eyedrop bottle or ointment tube. SEEK IMMEDIATE MEDICAL CARE IF:   Your infection has not improved within 3 days after beginning treatment.  You had yellow discharge from your eye and it returns.  You have increased eye pain.  Your eye redness is spreading.  Your vision becomes blurred.  You have a fever or persistent symptoms for more than 2-3 days.  You have a fever and your symptoms suddenly get worse.  You have facial pain, redness, or swelling. MAKE SURE YOU:   Understand these instructions.  Will watch your condition.  Will get help right away if you are not doing well or get worse.   This information is not intended to replace advice given to you by your health care provider. Make sure you   discuss any questions you have with your health care provider.   Document Released: 12/26/2004 Document Revised: 01/16/2014 Document Reviewed: 05/29/2011 Elsevier Interactive Patient Education 2016 [employer].  Earache An earache, also called otalgia, can be caused by many things. Pain from an earache can be sharp, dull, or burning. The pain may be temporary or  constant. Earaches can be caused by problems with the ear, such as infection in either the middle ear or the ear canal, injury, impacted ear wax, middle ear pressure, or a foreign body in the ear. Ear pain can also result from problems in other areas. This is called referred pain. For example, pain can come from a sore throat, a tooth infection, or problems with the jaw or the joint between the jaw and the skull (temporomandibular joint, or TMJ). The cause of an earache is not always easy to identify. Watchful waiting may be appropriate for some earaches until a clear cause of the pain can be found. HOME CARE INSTRUCTIONS Watch your condition for any changes. The following actions may help to lessen any discomfort that you are feeling:  Take medicines only as directed by your health care provider. This includes ear drops.  Apply ice to your outer ear to help reduce pain.  Put ice in a plastic bag.  Place a towel between your skin and the bag.  Leave the ice on for 20 minutes, 2-3 times per day.  Do not put anything in your ear other than medicine that is prescribed by your health care provider.  Try resting in an upright position instead of lying down. This may help to reduce pressure in the middle ear and relieve pain.  Chew gum if it helps to relieve your ear pain.  Control any allergies that you have.  Keep all follow-up visits as directed by your health care provider. This is important. SEEK MEDICAL CARE IF:  Your pain does not improve within 2 days.  You have a fever.  You have new or worsening symptoms. SEEK IMMEDIATE MEDICAL CARE IF:  You have a severe headache.  You have a stiff neck.  You have difficulty swallowing.  You have redness or swelling behind your ear.  You have drainage from your ear.  You have hearing loss.  You feel dizzy.   This information is not intended to replace advice given to you by your health care provider. Make sure you discuss any  questions you have with your health care provider.   Document Released: 08/13/2003 Document Revised: 01/16/2014 Document Reviewed: 07/27/2013 Elsevier Interactive Patient Education 2016 Elsevier Inc[employer]  Hyperglycemia Hyperglycemia occurs when the glucose (sugar) in your blood is too high. Hyperglycemia can happen for many reasons, but it most often happens to people who do not know they have diabetes or are not managing their diabetes properly.  CAUSES  Whether you have diabetes or not, there are other causes of hyperglycemia. Hyperglycemia can occur when you have diabetes, but it can also occur in other situations that you might not be as aware of, such as: Diabetes  If you have diabetes and are having problems controlling your blood glucose, hyperglycemia could occur because of some of the following reasons:  Not following your meal plan.  Not taking your diabetes medications or not taking it properly.  Exercising less or doing less activity than you normally do.  Being sick. Pre-diabetes  This cannot be ignored. Before people develop Type 2 diabetes, they almost always have "pre-diabetes." This is when your  blood glucose levels are higher than normal, but not yet high enough to be diagnosed as diabetes. Research has shown that some long-term damage to the body, especially the heart and circulatory system, may already be occurring during pre-diabetes. If you take action to manage your blood glucose when you have pre-diabetes, you may delay or prevent Type 2 diabetes from developing. Stress  If you have diabetes, you may be "diet" controlled or on oral medications or insulin to control your diabetes. However, you may find that your blood glucose is higher than usual in the hospital whether you have diabetes or not. This is often referred to as "stress hyperglycemia." Stress can elevate your blood glucose. This happens because of hormones put out by the body during times of stress. If stress  has been the cause of your high blood glucose, it can be followed regularly by your caregiver. That way he/she can make sure your hyperglycemia does not continue to get worse or progress to diabetes. Steroids  Steroids are medications that act on the infection fighting system (immune system) to block inflammation or infection. One side effect can be a rise in blood glucose. Most people can produce enough extra insulin to allow for this rise, but for those who cannot, steroids make blood glucose levels go even higher. It is not unusual for steroid treatments to "uncover" diabetes that is developing. It is not always possible to determine if the hyperglycemia will go away after the steroids are stopped. A special blood test called an A1c is sometimes done to determine if your blood glucose was elevated before the steroids were started. SYMPTOMS  Thirsty.  Frequent urination.  Dry mouth.  Blurred vision.  Tired or fatigue.  Weakness.  Sleepy.  Tingling in feet or leg. DIAGNOSIS  Diagnosis is made by monitoring blood glucose in one or all of the following ways:  A1c test. This is a chemical found in your blood.  Fingerstick blood glucose monitoring.  Laboratory results. TREATMENT  First, knowing the cause of the hyperglycemia is important before the hyperglycemia can be treated. Treatment may include, but is not be limited to:  Education.  Change or adjustment in medications.  Change or adjustment in meal plan.  Treatment for an illness, infection, etc.  More frequent blood glucose monitoring.  Change in exercise plan.  Decreasing or stopping steroids.  Lifestyle changes. HOME CARE INSTRUCTIONS   Test your blood glucose as directed.  Exercise regularly. Your caregiver will give you instructions about exercise. Pre-diabetes or diabetes which comes on with stress is helped by exercising.  Eat wholesome, balanced meals. Eat often and at regular, fixed times. Your  caregiver or nutritionist will give you a meal plan to guide your sugar intake.  Being at an ideal weight is important. If needed, losing as little as 10 to 15 pounds may help improve blood glucose levels. SEEK MEDICAL CARE IF:   You have questions about medicine, activity, or diet.  You continue to have symptoms (problems such as increased thirst, urination, or weight gain). SEEK IMMEDIATE MEDICAL CARE IF:   You are vomiting or have diarrhea.  Your breath smells fruity.  You are breathing faster or slower.  You are very sleepy or incoherent.  You have numbness, tingling, or pain in your feet or hands.  You have chest pain.  Your symptoms get worse even though you have been following your caregiver's orders.  If you have any other questions or concerns.   This information is not  intended to replace advice given to you by your health care provider. Make sure you discuss any questions you have with your health care provider.   Document Released: 06/21/2000 Document Revised: 03/20/2011 Document Reviewed: 09/01/2014 Elsevier Interactive Patient Education [employer] Elsevier Inc.

## 2015-06-13 NOTE — ED Provider Notes (Signed)
[facility] Emergency Department Provider Note   ____________________________________________  Time seen: Approximately 531 AM  I have reviewed the triage vital signs and the nursing notes.   HISTORY  Chief Complaint Hyperglycemia; Eye Problem; and Otalgia    HPI Caitlyn Fuller is a 40 y.o. female who comes into the hospital with some ear pressure, eye pain and high blood sugars. The patient reports she was admitted to [facility] a year ago because of her left ear. She reports that her ear does not drain well and she needs to have surgery done. She reports that she's been having some pressure in her ears for the past 2 weeks. She reports that her boyfriend had some problems with his ears person that she started having similar symptoms. She denies any drainage denies any pain but is concerned that it may be infected. The patient also reports that she has some pus at the lower lid of her eye. She reports it is uncomfortable as well and she is again concern for the infection. She reports that she does not put her hands in her eyes typically so she is unsure how it may have come about. The patient also reports she's been out of insulin for the past 10 days. The patient is unable to get an appointment with her doctor until June 21. The patient reports that her physician is unable to write her prescription for her insulin because she has not been there in a year.   Past Medical History  Diagnosis Date  . Diabetes mellitus without complication (HCC)   . Asthma   . Hernia   . Kidney infection   . Shingles     Patient Active Problem List   Diagnosis Date Noted  . Hyperglycemia without ketosis   . Chronic pain   . Herpes zoster   . Renal mass, left   . Essential hypertension   . Hyperglycemia 11/01/2014    Past Surgical History  Procedure Laterality Date  . Colonscopy    . Cholecystectomy    . Cesarean section    . Abdominal surgery      Current  Outpatient Rx  Name  Route  Sig  Dispense  Refill  . albuterol (PROVENTIL HFA;VENTOLIN HFA) 108 (90 BASE) MCG/ACT inhaler   Inhalation   Inhale 2 puffs into the lungs every 4 (four) hours as needed for wheezing.         Marland Kitchen erythromycin ophthalmic ointment   Left Eye   Place 1 application into the left eye 3 (three) times daily.   3.5 g   0   . insulin NPH-regular Human (NOVOLIN 70/30) (70-30) 100 UNIT/ML injection   Subcutaneous   Inject 28-38 Units into the skin See admin instructions. 38 units in the morning and 28 units in the evening         . insulin NPH-regular Human (NOVOLIN 70/30) (70-30) 100 UNIT/ML injection      Inject 52 units under the skin in the morning and 48 units under the skin at night   10 mL   0   . lisinopril (PRINIVIL,ZESTRIL) 10 MG tablet   Oral   Take 10 mg by mouth daily.         . naproxen (NAPROSYN) 500 MG tablet   Oral   Take 1 tablet (500 mg total) by mouth 2 (two) times daily with a meal.   20 tablet   2   . norethindrone-ethinyl estradiol (CYCLAFEM,ALYACEN) 0.5/0.75/1-35 MG-MCG tablet  Oral   Take 1 tablet by mouth daily.         Marland Kitchen oxyCODONE (ROXICODONE) 15 MG immediate release tablet   Oral   Take 15 mg by mouth every 4 (four) hours.            Allergies Gabapentin; Morphine and related; Darvocet; Toradol; and Ultram  No family history on file.  Social History Social History  Substance Use Topics  . Smoking status: Current Every Day Smoker -- 0.50 packs/day  . Smokeless tobacco: Not on file  . Alcohol Use: No    Review of Systems Constitutional: No fever/chills Eyes: Left eye pain, drainage, knot in eye ENT: Left ear pain Cardiovascular: Denies chest pain. Respiratory: Denies shortness of breath. Gastrointestinal: No abdominal pain.  No nausea, no vomiting.  No diarrhea.  No constipation. Genitourinary: Negative for dysuria. Musculoskeletal: Negative for back pain. Skin: Negative for rash. Neurological:  Negative for headaches, focal weakness or numbness.  10-point ROS otherwise negative.  ____________________________________________   PHYSICAL EXAM:  VITAL SIGNS: ED Triage Vitals  Enc Vitals Group     BP 06/13/15 0212 161/104 mmHg     Pulse Rate 06/13/15 0212 83     Resp 06/13/15 0212 18     Temp 06/13/15 0212 98.9 F (37.2 C)     Temp Source 06/13/15 0212 Oral     SpO2 06/13/15 0212 98 %     Weight 06/13/15 0212 235 lb (106.595 kg)     Height 06/13/15 0212 5' (1.524 m)     Head Cir --      Peak Flow --      Pain Score 06/13/15 0213 8     Pain Loc --      Pain Edu? --      Excl. in GC? --     Constitutional: Alert and oriented. Well appearing and in mild distress. Eyes: Conjunctivae injected with papule apparent near lateral canthus . PERRL. EOMI. Ear: Left ear with some mild cerumen impaction Head: Atraumatic. Nose: No congestion/rhinnorhea. Mouth/Throat: Mucous membranes are moist.  Oropharynx non-erythematous. Cardiovascular: Normal rate, regular rhythm. Grossly normal heart sounds.  Good peripheral circulation. Respiratory: Normal respiratory effort.  No retractions. Lungs CTAB. Gastrointestinal: Soft and nontender. No distention. Positive bowel sounds Musculoskeletal: No lower extremity tenderness nor edema.   Neurologic:  Normal speech and language.  Skin:  Skin is warm, dry and intact.  Psychiatric: Mood and affect are normal.   ____________________________________________   LABS (all labs ordered are listed, but only abnormal results are displayed)  Labs Reviewed  GLUCOSE, CAPILLARY - Abnormal; Notable for the following:    Glucose-Capillary 275 (*)    All other components within normal limits  BASIC METABOLIC PANEL - Abnormal; Notable for the following:    Sodium 132 (*)    Chloride 100 (*)    Glucose, Bld 278 (*)    Calcium 8.8 (*)    All other components within normal limits  CBC - Abnormal; Notable for the following:    WBC 13.3 (*)    RBC 5.71  (*)    MCV 78.7 (*)    MCH 25.8 (*)    RDW 15.5 (*)    All other components within normal limits  URINALYSIS COMPLETEWITH MICROSCOPIC ([facility] ONLY) - Abnormal; Notable for the following:    Color, Urine YELLOW (*)    APPearance CLEAR (*)    Glucose, UA 150 (*)    Bacteria, UA RARE (*)    Squamous Epithelial /  LPF 0-5 (*)    All other components within normal limits  CBG MONITORING, ED  POCT PREGNANCY, URINE   ____________________________________________  EKG  none ____________________________________________  RADIOLOGY  none ____________________________________________   PROCEDURES  Procedure(s) performed: None  Critical Care performed: No  ____________________________________________   INITIAL IMPRESSION / ASSESSMENT AND PLAN / ED COURSE  Pertinent labs & imaging results that were available during my care of the patient were reviewed by me and considered in my medical decision making (see chart for details).  This is a 40 year old female who comes into the hospital today with multiple complaints or request. The patient's ear is stuffy but does not appear to have any acute infection. I do not see any fluid behind the patient's TM. The patient does have a mild cerumen impaction but I am still able to visualize the TM at this point. The patient will be discharged to home away from the family house. The patient also was given a dose of her Novolin. ____________________________________________   FINAL CLINICAL IMPRESSION(S) / ED DIAGNOSES  Final diagnoses:  Stye, left  Conjunctivitis of left eye  Hyperglycemia  Ear pain, left      NEW MEDICATIONS STARTED DURING THIS VISIT:  Discharge Medication List as of 06/13/2015  6:14 AM    START taking these medications   Details  erythromycin ophthalmic ointment Place 1 application into the left eye 3 (three) times daily., Starting 06/13/2015, Until Sun 06/20/15, Print    !! insulin NPH-regular Human (NOVOLIN 70/30)  (70-30) 100 UNIT/ML injection Inject 52 units under the skin in the morning and 48 units under the skin at night, Print     !! - Potential duplicate medications found. Please discuss with provider.       Note:  This document was prepared using Dragon voice recognition software and may include unintentional dictation errors.    Rebecka ApleyAllison P Avaleen Brownley, MD 06/13/15 [phone]

## 2015-06-13 NOTE — ED Notes (Signed)
MD at bedside. 

## 2015-08-04 ENCOUNTER — Emergency Department
Admission: EM | Admit: 2015-08-04 | Discharge: 2015-08-04 | Disposition: A | Discharge status: Home / Self Care | Payer: Medicaid Other | Attending: Emergency Medicine | Admitting: Emergency Medicine

## 2015-08-04 DIAGNOSIS — E119 Type 2 diabetes mellitus without complications: Secondary | ICD-10-CM | POA: Diagnosis not present

## 2015-08-04 DIAGNOSIS — Z79899 Other long term (current) drug therapy: Secondary | ICD-10-CM | POA: Insufficient documentation

## 2015-08-04 DIAGNOSIS — Z7289 Other problems related to lifestyle: Secondary | ICD-10-CM | POA: Diagnosis not present

## 2015-08-04 DIAGNOSIS — J45909 Unspecified asthma, uncomplicated: Secondary | ICD-10-CM | POA: Insufficient documentation

## 2015-08-04 DIAGNOSIS — F17200 Nicotine dependence, unspecified, uncomplicated: Secondary | ICD-10-CM | POA: Insufficient documentation

## 2015-08-04 DIAGNOSIS — G8929 Other chronic pain: Secondary | ICD-10-CM | POA: Diagnosis not present

## 2015-08-04 DIAGNOSIS — Z794 Long term (current) use of insulin: Secondary | ICD-10-CM | POA: Insufficient documentation

## 2015-08-04 DIAGNOSIS — M25561 Pain in right knee: Secondary | ICD-10-CM | POA: Diagnosis present

## 2015-08-04 DIAGNOSIS — I10 Essential (primary) hypertension: Secondary | ICD-10-CM | POA: Insufficient documentation

## 2015-08-04 DIAGNOSIS — Z765 Malingerer [conscious simulation]: Secondary | ICD-10-CM

## 2015-08-04 NOTE — ED Notes (Signed)
Petrice Beedy RN wrapped the Pt's knee per Dr. Charmian Muff request.
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
## 2015-08-04 NOTE — ED Triage Notes (Addendum)
Pt to triage via w/c with no distress noted; pt reports "knot" to right knee x 3 weeks with no known cause or injury

## 2015-10-05 ENCOUNTER — Emergency Department
Admission: EM | Admit: 2015-10-05 | Discharge: 2015-10-05 | Disposition: A | Discharge status: Home / Self Care | Payer: Medicaid Other | Attending: Emergency Medicine | Admitting: Emergency Medicine

## 2015-10-05 ENCOUNTER — Encounter: Payer: Self-pay | Admitting: Medical Oncology

## 2015-10-05 ENCOUNTER — Emergency Department: Payer: Medicaid Other

## 2015-10-05 DIAGNOSIS — J45909 Unspecified asthma, uncomplicated: Secondary | ICD-10-CM | POA: Insufficient documentation

## 2015-10-05 DIAGNOSIS — Z794 Long term (current) use of insulin: Secondary | ICD-10-CM | POA: Diagnosis not present

## 2015-10-05 DIAGNOSIS — S99922A Unspecified injury of left foot, initial encounter: Secondary | ICD-10-CM | POA: Diagnosis present

## 2015-10-05 DIAGNOSIS — F17200 Nicotine dependence, unspecified, uncomplicated: Secondary | ICD-10-CM | POA: Insufficient documentation

## 2015-10-05 DIAGNOSIS — Y939 Activity, unspecified: Secondary | ICD-10-CM | POA: Insufficient documentation

## 2015-10-05 DIAGNOSIS — S0083XA Contusion of other part of head, initial encounter: Secondary | ICD-10-CM | POA: Diagnosis not present

## 2015-10-05 DIAGNOSIS — E119 Type 2 diabetes mellitus without complications: Secondary | ICD-10-CM | POA: Insufficient documentation

## 2015-10-05 DIAGNOSIS — S93602A Unspecified sprain of left foot, initial encounter: Secondary | ICD-10-CM | POA: Diagnosis not present

## 2015-10-05 DIAGNOSIS — Y999 Unspecified external cause status: Secondary | ICD-10-CM | POA: Insufficient documentation

## 2015-10-05 DIAGNOSIS — I10 Essential (primary) hypertension: Secondary | ICD-10-CM | POA: Diagnosis not present

## 2015-10-05 DIAGNOSIS — Y929 Unspecified place or not applicable: Secondary | ICD-10-CM | POA: Diagnosis not present

## 2015-10-05 DIAGNOSIS — W109XXA Fall (on) (from) unspecified stairs and steps, initial encounter: Secondary | ICD-10-CM | POA: Insufficient documentation

## 2015-10-05 MED ORDER — NAPROXEN 500 MG PO TABS
500.0000 mg | ORAL_TABLET | Freq: Once | ORAL | Status: AC
  Administered 2015-10-05: 500 mg via ORAL
  Filled 2015-10-05: qty 1

## 2015-10-05 MED ORDER — OXYCODONE-ACETAMINOPHEN 7.5-325 MG PO TABS: 1.0000 | ORAL_TABLET | ORAL | 0 refills | Status: AC | PRN

## 2015-10-05 MED ORDER — OXYCODONE-ACETAMINOPHEN 5-325 MG PO TABS
2.0000 | ORAL_TABLET | Freq: Once | ORAL | Status: AC
  Administered 2015-10-05: 2 via ORAL
  Filled 2015-10-05: qty 2

## 2015-10-05 NOTE — ED Triage Notes (Signed)
Pt reports she fell last night, she tripped and twisted her left ankle. Pt also reports back pain and she hit her rt eye. Pt denies use of blood thinner.

## 2015-10-05 NOTE — ED Provider Notes (Signed)
[facility] Emergency Department Provider Note   ____________________________________________   First MD Initiated Contact with Patient 10/05/15 1346     (approximate)  I have reviewed the triage vital signs and the nursing notes.   HISTORY  Chief Complaint Ankle Pain; Fall; and Back Pain    HPI Caitlyn Fuller is a 40 y.o. female patient states she tripped and fell down some stairs last night. Patient complain that she hit her face on the wall when she tried or carotid balance. Patient denies any loss of consciousness. Patient also complaining of left foot and ankle pain secondary to this fall. No palliative measures taken for this complaint. Patient rates the pain as a 10 over 10. Patient described a pain as "throbbing".   Past Medical History:  Diagnosis Date  . Asthma   . Diabetes mellitus without complication (HCC)   . Hernia   . Kidney infection   . Shingles     Patient Active Problem List   Diagnosis Date Noted  . Hyperglycemia without ketosis   . Chronic pain   . Herpes zoster   . Renal mass, left   . Essential hypertension   . Hyperglycemia 11/01/2014    Past Surgical History:  Procedure Laterality Date  . ABDOMINAL SURGERY    . CESAREAN SECTION    . CHOLECYSTECTOMY    . colonscopy      Prior to Admission medications   Medication Sig Start Date End Date Taking? Authorizing Provider  albuterol (PROVENTIL HFA;VENTOLIN HFA) 108 (90 BASE) MCG/ACT inhaler Inhale 2 puffs into the lungs every 4 (four) hours as needed for wheezing.    Historical Provider, MD  insulin NPH-regular Human (NOVOLIN 70/30) (70-30) 100 UNIT/ML injection Inject 28-38 Units into the skin See admin instructions. 38 units in the morning and 28 units in the evening    Historical Provider, MD  insulin NPH-regular Human (NOVOLIN 70/30) (70-30) 100 UNIT/ML injection Inject 52 units under the skin in the morning and 48 units under the skin at night 06/13/15   Rebecka ApleyAllison  P Webster, MD  lisinopril (PRINIVIL,ZESTRIL) 10 MG tablet Take 10 mg by mouth daily.    Historical Provider, MD  naproxen (NAPROSYN) 500 MG tablet Take 1 tablet (500 mg total) by mouth 2 (two) times daily with a meal. 04/26/15   Jene Everyobert Kinner, MD  norethindrone-ethinyl estradiol (CYCLAFEM,ALYACEN) 0.5/0.75/1-35 MG-MCG tablet Take 1 tablet by mouth daily.    Historical Provider, MD  oxyCODONE (ROXICODONE) 15 MG immediate release tablet Take 15 mg by mouth every 4 (four) hours.     Historical Provider, MD  oxyCODONE-acetaminophen (PERCOCET) 7.5-325 MG tablet Take 1 tablet by mouth every 4 (four) hours as needed for severe pain. 10/05/15 10/04/16  Joni Reiningonald K Smith, PA-C    Allergies Gabapentin; Morphine and related; Darvocet [propoxyphene n-acetaminophen]; Toradol [ketorolac tromethamine]; and Ultram [tramadol]  No family history on file.  Social History Social History  Substance Use Topics  . Smoking status: Current Every Day Smoker    Packs/day: 0.50  . Smokeless tobacco: Not on file  . Alcohol use No    Review of Systems Constitutional: No fever/chills Eyes: No visual changes. ENT: No sore throat. Cardiovascular: Denies chest pain. Respiratory: Denies shortness of breath. Gastrointestinal: No abdominal pain.  No nausea, no vomiting.  No diarrhea.  No constipation. Genitourinary: Negative for dysuria. Musculoskeletal:Right facial and left foot pain  Skin: Bruising to face, superior aspect of the right chest. And left forearm. wall. Neurological: Negative for headaches,  focal weakness or numbness. Endocrine:Hypertensionand hyperglycemia Allergic/Immunilogical: See medication list ________________________________________   PHYSICAL EXAM:  VITAL SIGNS: ED Triage Vitals  Enc Vitals Group     BP 10/05/15 1329 (!) 169/92     Pulse Rate 10/05/15 1329 99     Resp 10/05/15 1329 18     Temp 10/05/15 1329 97.5 F (36.4 C)     Temp Source 10/05/15 1329 Oral     SpO2 10/05/15 1329 97 %       Weight 10/05/15 1329 232 lb (105.2 kg)     Height 10/05/15 1329 5' (1.524 m)     Head Circumference --      Peak Flow --      Pain Score 10/05/15 1330 10     Pain Loc --      Pain Edu? --      Excl. in GC? --     Constitutional: Alert and oriented. Well appearing and in no acute distress.Patient appears anxious Eyes: Conjunctivae are normal. PERRL. EOMI. Head: Atraumatic. Nose: No congestion/rhinnorhea. Mouth/Throat: Mucous membranes are moist.  Oropharynx non-erythematous. Neck: No stridor. No cervical spine tenderness to palpation. Hematological/Lymphatic/Immunilogical: No cervical lymphadenopathy. Cardiovascular: Normal rate, regular rhythm. Grossly normal heart sounds.  Good peripheral circulation. Respiratory: Normal respiratory effort.  No retractions. Lungs CTAB. Gastrointestinal: Soft and nontender. No distention. No abdominal bruits. No CVA tenderness. Musculoskeletal: No obvious deformity or edema to the left foot. Patient's tender palpation third to the fifth metatarsal . Neurologic:  Normal speech and language. No gross focal neurologic deficits are appreciated. No gait instability. Skin:  Skin is warm, dry and intact. No rash noted. Edema and ecchymosis superior aspect right orbital area Psychiatric: Mood and affect are normal. Speech and behavior are normal.  ____________________________________________   LABS (all labs ordered are listed, but only abnormal results are displayed)  Labs Reviewed - No data to display ____________________________________________  EKG   ____________________________________________  RADIOLOGY   ____________________________________________   PROCEDURES  Procedure(s) performed: None  Procedures  Critical Care performed: No  ____________________________________________   INITIAL IMPRESSION / ASSESSMENT AND PLAN / ED COURSE  Pertinent labs & imaging results that were available during my care of the patient were  reviewed by me and considered in my medical decision making (see chart for details).  Facial contusion sprain left foot. Discuss negative x-ray findings with patient. Patient given discharge care instructions.  Clinical Course     ____________________________________________   FINAL CLINICAL IMPRESSION(S) / ED DIAGNOSES  Final diagnoses:  Facial contusion, initial encounter  Sprain of left foot, initial encounter      NEW MEDICATIONS STARTED DURING THIS VISIT:  New Prescriptions   OXYCODONE-ACETAMINOPHEN (PERCOCET) 7.5-325 MG TABLET    Take 1 tablet by mouth every 4 (four) hours as needed for severe pain.     Note:  This document was prepared using Dragon voice recognition software and may include unintentional dictation errors.    Joni Reining, PA-C 10/05/15 1518    Minna Antis, MD 10/05/15 [phone]

## 2015-10-05 NOTE — ED Notes (Signed)
Pt reports she fell down some steps and hit lower back/right eye and twisted left ankle/foot - right eye has bruising to eyelid - no bruising noted to left ankle/foot - no obv swelling noted to left ankle/foot

## 2015-10-06 NOTE — Progress Notes (Deleted)
  Pt A and O x 4. VSS. Pt tolerating diet well. No complaints of pain or nausea. IV removed intact, prescriptions given. Pt voiced understanding of discharge instructions with no further questions. Pt given instructions on diet and wound care. Pt discharged via wheelchair with nurse.  

## 2016-01-09 ENCOUNTER — Encounter (HOSPITAL_COMMUNITY): Payer: Self-pay | Admitting: Emergency Medicine

## 2016-01-09 ENCOUNTER — Emergency Department (HOSPITAL_COMMUNITY)
Admission: EM | Admit: 2016-01-09 | Discharge: 2016-01-09 | Disposition: A | Discharge status: Home / Self Care | Payer: Medicaid Other | Attending: Emergency Medicine | Admitting: Emergency Medicine

## 2016-01-09 DIAGNOSIS — E119 Type 2 diabetes mellitus without complications: Secondary | ICD-10-CM | POA: Diagnosis not present

## 2016-01-09 DIAGNOSIS — J45909 Unspecified asthma, uncomplicated: Secondary | ICD-10-CM | POA: Insufficient documentation

## 2016-01-09 DIAGNOSIS — Z5321 Procedure and treatment not carried out due to patient leaving prior to being seen by health care provider: Secondary | ICD-10-CM | POA: Diagnosis not present

## 2016-01-09 DIAGNOSIS — F17200 Nicotine dependence, unspecified, uncomplicated: Secondary | ICD-10-CM | POA: Diagnosis not present

## 2016-01-09 MED ORDER — ALBUTEROL SULFATE (2.5 MG/3ML) 0.083% IN NEBU
5.0000 mg | INHALATION_SOLUTION | Freq: Once | RESPIRATORY_TRACT | Status: AC
  Administered 2016-01-09: 5 mg via RESPIRATORY_TRACT

## 2016-01-09 MED ORDER — ALBUTEROL SULFATE (2.5 MG/3ML) 0.083% IN NEBU
INHALATION_SOLUTION | RESPIRATORY_TRACT | Status: AC
  Administered 2016-01-09: 5 mg
  Filled 2016-01-09: qty 6

## 2016-01-09 NOTE — ED Notes (Signed)
Unable to locate

## 2016-01-09 NOTE — ED Triage Notes (Signed)
Patient reports persistent productive cough with wheezing onset this week , denies fever or chills .

## 2016-01-12 ENCOUNTER — Emergency Department
Admission: EM | Admit: 2016-01-12 | Discharge: 2016-01-12 | Disposition: A | Discharge status: Home / Self Care | Payer: Medicaid Other | Attending: Emergency Medicine | Admitting: Emergency Medicine

## 2016-01-12 ENCOUNTER — Emergency Department: Payer: Medicaid Other

## 2016-01-12 DIAGNOSIS — I10 Essential (primary) hypertension: Secondary | ICD-10-CM | POA: Insufficient documentation

## 2016-01-12 DIAGNOSIS — Z794 Long term (current) use of insulin: Secondary | ICD-10-CM | POA: Diagnosis not present

## 2016-01-12 DIAGNOSIS — J111 Influenza due to unidentified influenza virus with other respiratory manifestations: Secondary | ICD-10-CM | POA: Diagnosis not present

## 2016-01-12 DIAGNOSIS — Z79899 Other long term (current) drug therapy: Secondary | ICD-10-CM | POA: Diagnosis not present

## 2016-01-12 DIAGNOSIS — F17200 Nicotine dependence, unspecified, uncomplicated: Secondary | ICD-10-CM | POA: Insufficient documentation

## 2016-01-12 DIAGNOSIS — E119 Type 2 diabetes mellitus without complications: Secondary | ICD-10-CM | POA: Diagnosis not present

## 2016-01-12 DIAGNOSIS — J45909 Unspecified asthma, uncomplicated: Secondary | ICD-10-CM | POA: Insufficient documentation

## 2016-01-12 DIAGNOSIS — R05 Cough: Secondary | ICD-10-CM | POA: Diagnosis present

## 2016-01-12 LAB — RAPID INFLUENZA A&B ANTIGENS
Influenza A (ARMC): NEGATIVE
Influenza B (ARMC): NEGATIVE

## 2016-01-12 MED ORDER — OSELTAMIVIR PHOSPHATE 75 MG PO CAPS: 75.0000 mg | ORAL_CAPSULE | Freq: Two times a day (BID) | ORAL | 0 refills | Status: AC

## 2016-01-12 MED ORDER — ALBUTEROL SULFATE (2.5 MG/3ML) 0.083% IN NEBU
INHALATION_SOLUTION | RESPIRATORY_TRACT | Status: AC
  Administered 2016-01-12: 2.5 mg
  Filled 2016-01-12: qty 3

## 2016-01-12 MED ORDER — IBUPROFEN 800 MG PO TABS
ORAL_TABLET | ORAL | Status: AC
  Filled 2016-01-12: qty 1

## 2016-01-12 MED ORDER — IBUPROFEN 100 MG/5ML PO SUSP
800.0000 mg | Freq: Once | ORAL | Status: AC
  Administered 2016-01-12: 800 mg via ORAL
  Filled 2016-01-12: qty 40

## 2016-01-12 MED ORDER — HYDROCOD POLST-CPM POLST ER 10-8 MG/5ML PO SUER: 5.0000 mL | Freq: Two times a day (BID) | ORAL | 0 refills | Status: DC | PRN

## 2016-01-12 MED ORDER — HYDROCOD POLST-CPM POLST ER 10-8 MG/5ML PO SUER
5.0000 mL | Freq: Once | ORAL | Status: AC
Start: 2016-01-12 — End: 2016-01-12
  Administered 2016-01-12: 5 mL via ORAL
  Filled 2016-01-12: qty 5

## 2016-01-12 MED ORDER — OSELTAMIVIR PHOSPHATE 75 MG PO CAPS
75.0000 mg | ORAL_CAPSULE | Freq: Once | ORAL | Status: AC
  Administered 2016-01-12: 75 mg via ORAL
  Filled 2016-01-12: qty 1

## 2016-01-12 MED ORDER — ALBUTEROL SULFATE (2.5 MG/3ML) 0.083% IN NEBU
2.5000 mg | INHALATION_SOLUTION | RESPIRATORY_TRACT | 0 refills | Status: DC | PRN
Start: 2016-01-12 — End: 2021-04-12

## 2016-01-12 MED ORDER — ALBUTEROL SULFATE HFA 108 (90 BASE) MCG/ACT IN AERS: 2.0000 | INHALATION_SPRAY | Freq: Four times a day (QID) | RESPIRATORY_TRACT | 2 refills | Status: DC | PRN

## 2016-01-12 NOTE — ED Provider Notes (Signed)
[facility] Emergency Department Provider Note   First MD Initiated Contact with Patient 01/12/16 [phone]     (approximate)  I have reviewed the triage vital signs and the nursing notes.   HISTORY  Chief Complaint Influenza  HPI Caitlyn Fuller is a 41 y.o. female with a below list of chronic medical conditions presents to the emergency department with cough congestion and subjective fevers 2 days. Of note patient has a sick contact who is confirmed with the flu.   Past Medical History:  Diagnosis Date  . Asthma   . Diabetes mellitus without complication (HCC)   . Hernia   . Kidney infection   . Shingles     Patient Active Problem List   Diagnosis Date Noted  . Hyperglycemia without ketosis   . Chronic pain   . Herpes zoster   . Renal mass, left   . Essential hypertension   . Hyperglycemia 11/01/2014    Past Surgical History:  Procedure Laterality Date  . ABDOMINAL SURGERY    . CESAREAN SECTION    . CHOLECYSTECTOMY    . colonscopy      Prior to Admission medications   Medication Sig Start Date End Date Taking? Authorizing Provider  albuterol (PROVENTIL HFA;VENTOLIN HFA) 108 (90 BASE) MCG/ACT inhaler Inhale 2 puffs into the lungs every 4 (four) hours as needed for wheezing.    Historical Provider, MD  insulin NPH-regular Human (NOVOLIN 70/30) (70-30) 100 UNIT/ML injection Inject 28-38 Units into the skin See admin instructions. 38 units in the morning and 28 units in the evening    Historical Provider, MD  insulin NPH-regular Human (NOVOLIN 70/30) (70-30) 100 UNIT/ML injection Inject 52 units under the skin in the morning and 48 units under the skin at night 06/13/15   Rebecka ApleyAllison P Webster, MD  lisinopril (PRINIVIL,ZESTRIL) 10 MG tablet Take 10 mg by mouth daily.    Historical Provider, MD  naproxen (NAPROSYN) 500 MG tablet Take 1 tablet (500 mg total) by mouth 2 (two) times daily with a meal. 04/26/15   Jene Everyobert Kinner, MD  norethindrone-ethinyl  estradiol (CYCLAFEM,ALYACEN) 0.5/0.75/1-35 MG-MCG tablet Take 1 tablet by mouth daily.    Historical Provider, MD  oxyCODONE (ROXICODONE) 15 MG immediate release tablet Take 15 mg by mouth every 4 (four) hours.     Historical Provider, MD  oxyCODONE-acetaminophen (PERCOCET) 7.5-325 MG tablet Take 1 tablet by mouth every 4 (four) hours as needed for severe pain. 10/05/15 10/04/16  Joni Reiningonald K Smith, PA-C    Allergies Gabapentin; Morphine and related; Darvocet [propoxyphene n-acetaminophen]; Toradol [ketorolac tromethamine]; and Ultram [tramadol]  No family history on file.  Social History Social History  Substance Use Topics  . Smoking status: Current Every Day Smoker    Packs/day: 0.50  . Smokeless tobacco: Never Used  . Alcohol use No    Review of Systems Constitutional:Positive for fever/chills Eyes: No visual changes. ENT: No sore throat. Positive for nasal congestion Cardiovascular: Denies chest pain. Respiratory: Denies shortness of breath.Positive for cough Gastrointestinal: No abdominal pain.  No nausea, no vomiting.  No diarrhea.  No constipation. Genitourinary: Negative for dysuria. Musculoskeletal: Negative for back pain. Skin: Negative for rash. Neurological: Negative for headaches, focal weakness or numbness.  10-point ROS otherwise negative.  ____________________________________________   PHYSICAL EXAM:  VITAL SIGNS: ED Triage Vitals  Enc Vitals Group     BP 01/12/16 0140 (!) 147/99     Pulse Rate 01/12/16 0139 (!) 106     Resp 01/12/16 0139 Marland Kitchen(!)  24     Temp 01/12/16 0139 99.5 F (37.5 C)     Temp Source 01/12/16 0139 Oral     SpO2 01/12/16 0139 96 %     Weight 01/12/16 0139 232 lb (105.2 kg)     Height 01/12/16 0139 5' (1.524 m)     Head Circumference --      Peak Flow --      Pain Score 01/12/16 0139 10     Pain Loc --      Pain Edu? --      Excl. in GC? --     Constitutional: Alert and oriented. Well appearing and in no acute distress.Actively  coughing Eyes: Conjunctivae are normal. PERRL. EOMI. Head: Atraumatic. Nose: No congestion/rhinnorhea. Mouth/Throat: Mucous membranes are moist.  Oropharynx non-erythematous. Neck: No stridor.   Cardiovascular: Normal rate, regular rhythm. Good peripheral circulation. Grossly normal heart sounds. Respiratory: Normal respiratory effort.  No retractions. Lungs CTAB. Gastrointestinal: Soft and nontender. No distention.  Musculoskeletal: No lower extremity tenderness nor edema. No gross deformities of extremities. Neurologic:  Normal speech and language. No gross focal neurologic deficits are appreciated.  Skin:  Skin is warm, dry and intact. No rash noted.   ____________________________________________   LABS (all labs ordered are listed, but only abnormal results are displayed)  Labs Reviewed  RAPID INFLUENZA A&B ANTIGENS ([facility] ONLY)   ___  RADIOLOGY I, Geyser N BROWN, personally viewed and evaluated these images (plain radiographs) as part of my medical decision making, as well as reviewing the written report by the radiologist.  Dg Chest 2 View  Result Date: 01/12/2016 CLINICAL DATA:  Dry cough for 6 days EXAM: CHEST  2 VIEW COMPARISON:  04/26/2015 FINDINGS: The heart size and mediastinal contours are within normal limits. Both lungs are clear. The visualized skeletal structures are unremarkable. Surgical clips in the right upper quadrant IMPRESSION: No active cardiopulmonary disease. Electronically Signed   By: Jasmine Pang M.D.   On: 01/12/2016 02:19    :   Procedures     INITIAL IMPRESSION / ASSESSMENT AND PLAN / ED COURSE  Pertinent labs & imaging results that were available during my care of the patient were reviewed by me and considered in my medical decision making (see chart for details).  Patient's daughter is currently being seen by myself was confirmed to have the flu a such I question if the patient's flu test is a false-negative given symptoms consistent  with the flu with a close contact that lives in her home confirmed for the flu. Patient requests Tamiflu. Of note patient stated on arrival to the emergency department her symptoms have been going on for 6 days however when I informed her of the fact that Tamiflu will not be of any benefit beyond 2 days to patient stated that her symptoms have only been going on for 2 days and requested Tamiflu.   Clinical Course     ____________________________________________  FINAL CLINICAL IMPRESSION(S) / ED DIAGNOSES  Final diagnoses:  Influenza     MEDICATIONS GIVEN DURING THIS VISIT:  Medications  ibuprofen (ADVIL,MOTRIN) 800 MG tablet (not administered)  chlorpheniramine-HYDROcodone (TUSSIONEX) 10-8 MG/5ML suspension 5 mL (not administered)  oseltamivir (TAMIFLU) capsule 75 mg (not administered)  ibuprofen (ADVIL,MOTRIN) 100 MG/5ML suspension 800 mg (800 mg Oral Given 01/12/16 0143)     NEW OUTPATIENT MEDICATIONS STARTED DURING THIS VISIT:  New Prescriptions   No medications on file    Modified Medications   No medications on file  Discontinued Medications   No medications on file     Note:  This document was prepared using Dragon voice recognition software and may include unintentional dictation errors.    Darci Current, MD 01/12/16 [phone]

## 2016-01-12 NOTE — ED Triage Notes (Signed)
Pt with dry cough x 6 days, bodyaches, congestion, and shob at times. Family has recently had the flu, dry cough noted in triage.

## 2016-03-06 ENCOUNTER — Encounter: Payer: Self-pay | Admitting: Emergency Medicine

## 2016-03-06 ENCOUNTER — Emergency Department
Admission: EM | Admit: 2016-03-06 | Discharge: 2016-03-06 | Disposition: A | Discharge status: Home / Self Care | Payer: Medicaid Other | Attending: Emergency Medicine | Admitting: Emergency Medicine

## 2016-03-06 ENCOUNTER — Emergency Department: Payer: Medicaid Other

## 2016-03-06 DIAGNOSIS — J45909 Unspecified asthma, uncomplicated: Secondary | ICD-10-CM | POA: Diagnosis not present

## 2016-03-06 DIAGNOSIS — Z794 Long term (current) use of insulin: Secondary | ICD-10-CM | POA: Diagnosis not present

## 2016-03-06 DIAGNOSIS — I10 Essential (primary) hypertension: Secondary | ICD-10-CM | POA: Diagnosis not present

## 2016-03-06 DIAGNOSIS — F17200 Nicotine dependence, unspecified, uncomplicated: Secondary | ICD-10-CM | POA: Insufficient documentation

## 2016-03-06 DIAGNOSIS — M7989 Other specified soft tissue disorders: Secondary | ICD-10-CM | POA: Diagnosis present

## 2016-03-06 DIAGNOSIS — Z79899 Other long term (current) drug therapy: Secondary | ICD-10-CM | POA: Insufficient documentation

## 2016-03-06 DIAGNOSIS — R609 Edema, unspecified: Secondary | ICD-10-CM | POA: Diagnosis not present

## 2016-03-06 DIAGNOSIS — E119 Type 2 diabetes mellitus without complications: Secondary | ICD-10-CM | POA: Diagnosis not present

## 2016-03-06 DIAGNOSIS — R079 Chest pain, unspecified: Secondary | ICD-10-CM | POA: Insufficient documentation

## 2016-03-06 HISTORY — DX: Essential (primary) hypertension: I10

## 2016-03-06 LAB — HEPATIC FUNCTION PANEL
ALBUMIN: 3.4 g/dL — AB (ref 3.5–5.0)
ALT: 17 U/L (ref 14–54)
AST: 20 U/L (ref 15–41)
Alkaline Phosphatase: 68 U/L (ref 38–126)
BILIRUBIN TOTAL: 0.6 mg/dL (ref 0.3–1.2)
Bilirubin, Direct: <0.1 mg/dL — ABNORMAL LOW (ref 0.1–0.5)
Total Protein: 6.8 g/dL (ref 6.5–8.1)

## 2016-03-06 LAB — BASIC METABOLIC PANEL
ANION GAP: 7 (ref 5–15)
BUN: 11 mg/dL (ref 6–20)
CO2: 30 mmol/L (ref 22–32)
Calcium: 8.5 mg/dL — ABNORMAL LOW (ref 8.9–10.3)
Chloride: 99 mmol/L — ABNORMAL LOW (ref 101–111)
Creatinine, Ser: 0.7 mg/dL (ref 0.44–1.00)
GFR calc non Af Amer: >60 mL/min (ref >60)
Glucose, Bld: 186 mg/dL — ABNORMAL HIGH (ref 65–99)
POTASSIUM: 3.9 mmol/L (ref 3.5–5.1)
Sodium: 136 mmol/L (ref 135–145)

## 2016-03-06 LAB — CBC
HEMATOCRIT: 39.4 % (ref 35.0–47.0)
HEMOGLOBIN: 12.9 g/dL (ref 12.0–16.0)
MCH: 26.2 pg (ref 26.0–34.0)
MCHC: 32.8 g/dL (ref 32.0–36.0)
MCV: 79.9 fL — ABNORMAL LOW (ref 80.0–100.0)
Platelets: 239 10*3/uL (ref 150–440)
RBC: 4.93 MIL/uL (ref 3.80–5.20)
RDW: 15.5 % — ABNORMAL HIGH (ref 11.5–14.5)
WBC: 13.7 10*3/uL — ABNORMAL HIGH (ref 3.6–11.0)

## 2016-03-06 LAB — TROPONIN I

## 2016-03-06 LAB — LIPASE, BLOOD: LIPASE: 18 U/L (ref 11–51)

## 2016-03-06 MED ORDER — FUROSEMIDE 20 MG PO TABS: 20.0000 mg | ORAL_TABLET | Freq: Every day | ORAL | 0 refills | Status: DC

## 2016-03-06 MED ORDER — VALACYCLOVIR HCL 500 MG PO TABS: 500.0000 mg | ORAL_TABLET | Freq: Three times a day (TID) | ORAL | 0 refills | Status: AC

## 2016-03-06 NOTE — ED Triage Notes (Signed)
Patient states that she has had intermittent epigastric pain that started 2 days ago. Patient states that she started having swelling to her abdomin and lower legs about 3 hours ago.

## 2016-03-06 NOTE — ED Provider Notes (Signed)
[facility] Emergency Department Provider Note  Time seen: 4:41 AM  I have reviewed the triage vital signs and the nursing notes.   HISTORY  Chief Complaint Abdominal Pain and Leg Swelling    HPI Caitlyn Fuller is a 41 y.o. female with a past medical history of asthma, diabetes, hypertension, presents to the emergency department with diffuse complaints. According to the patient she states she has a headache for the past 2 days. She has been experiencing intermittent chest pain with occasional radiation into the right arm for the past several months. She states she has been experiencing increased leg swelling over the past several weeks. States her legs become painful when she has to stand for too long. She describes moderate lower back pain which she states is chronic but worse over the last few weeks. She also feels like her abdomen is bloated. States she is having some discomfort in the upper abdomen. Patient also states she ran out of her valacyclovir and is currently experiencing a genital herpes outbreak and came for a refill of this. Patient also states she ran out of oxycodone 3 days ago and she has not been able to establish care at a new pain clinic yet.  Past Medical History:  Diagnosis Date  . Asthma   . Diabetes mellitus without complication (HCC)   . Hernia   . Hypertension   . Kidney infection   . Shingles     Patient Active Problem List   Diagnosis Date Noted  . Hyperglycemia without ketosis   . Chronic pain   . Herpes zoster   . Renal mass, left   . Essential hypertension   . Hyperglycemia 11/01/2014    Past Surgical History:  Procedure Laterality Date  . ABDOMINAL SURGERY    . CESAREAN SECTION    . CHOLECYSTECTOMY    . colonscopy    . TUBAL LIGATION      Prior to Admission medications   Medication Sig Start Date End Date Taking? Authorizing Provider  albuterol (PROVENTIL HFA;VENTOLIN HFA) 108 (90 BASE) MCG/ACT inhaler Inhale  2 puffs into the lungs every 4 (four) hours as needed for wheezing.   Yes Historical Provider, MD  albuterol (PROVENTIL HFA;VENTOLIN HFA) 108 (90 Base) MCG/ACT inhaler Inhale 2 puffs into the lungs every 6 (six) hours as needed for wheezing or shortness of breath. 01/12/16  Yes Darci Current, MD  insulin NPH-regular Human (NOVOLIN 70/30) (70-30) 100 UNIT/ML injection Inject 52 units under the skin in the morning and 48 units under the skin at night Patient taking differently: Inject 50 units under the skin in the morning and 40 units under the skin at night 06/13/15  Yes Rebecka Apley, MD  lisinopril (PRINIVIL,ZESTRIL) 10 MG tablet Take 10 mg by mouth daily.   Yes Historical Provider, MD  albuterol (PROVENTIL) (2.5 MG/3ML) 0.083% nebulizer solution Take 3 mLs (2.5 mg total) by nebulization every 4 (four) hours as needed for wheezing or shortness of breath. 01/12/16   Darci Current, MD  chlorpheniramine-HYDROcodone [facility] PENNKINETIC ER) 10-8 MG/5ML SUER Take 5 mLs by mouth every 12 (twelve) hours as needed for cough. Patient not taking: Reported on 03/06/2016 01/12/16   Darci Current, MD  naproxen (NAPROSYN) 500 MG tablet Take 1 tablet (500 mg total) by mouth 2 (two) times daily with a meal. 04/26/15   Jene Every, MD  norethindrone-ethinyl estradiol (CYCLAFEM,ALYACEN) 0.5/0.75/1-35 MG-MCG tablet Take 1 tablet by mouth daily.    Historical Provider, MD  oxyCODONE (ROXICODONE) 15 MG immediate release tablet Take 15 mg by mouth every 4 (four) hours.     Historical Provider, MD  oxyCODONE-acetaminophen (PERCOCET) 7.5-325 MG tablet Take 1 tablet by mouth every 4 (four) hours as needed for severe pain. Patient not taking: Reported on 03/06/2016 10/05/15 10/04/16  Joni Reining, PA-C    Allergies  Allergen Reactions  . Gabapentin Anaphylaxis  . Morphine And Related Anaphylaxis  . Darvocet [Propoxyphene N-Acetaminophen] Hives  . Toradol [Ketorolac Tromethamine] Other (See Comments)    migraines   . Ultram [Tramadol] Hives    No family history on file.  Social History Social History  Substance Use Topics  . Smoking status: Current Every Day Smoker    Packs/day: 0.50  . Smokeless tobacco: Never Used  . Alcohol use No    Review of Systems Constitutional: Negative for fever. Cardiovascular: Intermittent chest pain over the past several months Respiratory: States some shortness of breath with occasional cough. Gastrointestinal: Upper abdominal discomfort. Denies vomiting or diarrhea. Genitourinary: Negative for dysuria. Musculoskeletal: Lower back pain. Pain in bilateral legs. Increased swelling in bilateral legs. Skin: Negative for rash. Neurological: Moderate headache 10-point ROS otherwise negative.  ____________________________________________   PHYSICAL EXAM:  VITAL SIGNS: ED Triage Vitals  Enc Vitals Group     BP 03/06/16 0328 (!) 146/92     Pulse Rate 03/06/16 0328 89     Resp 03/06/16 0328 18     Temp 03/06/16 0328 98.9 F (37.2 C)     Temp Source 03/06/16 0328 Oral     SpO2 03/06/16 0328 96 %     Weight 03/06/16 0323 232 lb (105.2 kg)     Height 03/06/16 0323 5' (1.524 m)     Head Circumference --      Peak Flow --      Pain Score 03/06/16 0323 10     Pain Loc --      Pain Edu? --      Excl. in GC? --     Constitutional: Alert and oriented. Well appearing and in no distress.Ask [place] to turn the light off because she has a headache. Eyes: Normal exam, PERRL. ENT   Head: Normocephalic and atraumatic.   Mouth/Throat: Mucous membranes are moist. Cardiovascular: Normal rate, regular rhythm. No murmur Respiratory: Normal respiratory effort without tachypnea nor retractions. Breath sounds are clear Gastrointestinal: Soft, slight epigastric tenderness, no rebound or guarding. No distention. Musculoskeletal: Nontender with normal range of motion in all extremities. 2+ lower extremity edema equal bilaterally. Neurologic:  Normal speech and language.  No gross focal neurologic deficits are appreciated. Skin:  Skin is warm, dry and intact.  Psychiatric: Mood and affect are normal. Speech and behavior are normal.   ____________________________________________    EKG  EKG reviewed and interpreted by myself shows normal sinus rhythm at 89 bpm, narrow QRS, normal axis, normal intervals, no ST changes.  ____________________________________________    RADIOLOGY  Chest x-ray is negative  ____________________________________________   INITIAL IMPRESSION / ASSESSMENT AND PLAN / ED COURSE  Pertinent labs & imaging results that were available during my care of the patient were reviewed by me and considered in my medical decision making (see chart for details).  Patient presents to emergency department with multiple symptoms including headache, increased lower extremity swelling, abdominal bloating and discomfort, lower back pain, chest pain for months with occasional radiation into the right arm, asking for a refill of valacyclovir, also stating she is out of pain medication. Overall the patient  appears well, no distress. Patient's labs show a slight leukocytosis of 13,000 otherwise normal including lipase and troponin. I will add on liver function testing. Chest x-rays negative. Patient does have lower extremity edema but also has signs of chronic changes and lower extremities suspect this is likely chronic but possibly increased. We will start the patient on a low-dose Lasix for the next 7 days. Discussed with the patient she needs to follow up with pain management. We will refill a 7 day prescription of valacyclovir.  LFTs negative. We'll discharge the valacyclovir, Lasix, and PCP follow-up.  ____________________________________________   FINAL CLINICAL IMPRESSION(S) / ED DIAGNOSES  Chest pain Peripheral edema Acute on chronic pain    Minna AntisKevin Kaileia Flow, MD 03/06/16 [id number]

## 2016-03-15 ENCOUNTER — Emergency Department
Admission: EM | Admit: 2016-03-15 | Discharge: 2016-03-15 | Disposition: A | Discharge status: Home / Self Care | Payer: Medicaid Other | Attending: Emergency Medicine | Admitting: Emergency Medicine

## 2016-03-15 DIAGNOSIS — J45909 Unspecified asthma, uncomplicated: Secondary | ICD-10-CM | POA: Diagnosis not present

## 2016-03-15 DIAGNOSIS — Z794 Long term (current) use of insulin: Secondary | ICD-10-CM | POA: Insufficient documentation

## 2016-03-15 DIAGNOSIS — R600 Localized edema: Secondary | ICD-10-CM | POA: Insufficient documentation

## 2016-03-15 DIAGNOSIS — Z79899 Other long term (current) drug therapy: Secondary | ICD-10-CM | POA: Diagnosis not present

## 2016-03-15 DIAGNOSIS — R609 Edema, unspecified: Secondary | ICD-10-CM

## 2016-03-15 DIAGNOSIS — F17200 Nicotine dependence, unspecified, uncomplicated: Secondary | ICD-10-CM | POA: Diagnosis not present

## 2016-03-15 DIAGNOSIS — I10 Essential (primary) hypertension: Secondary | ICD-10-CM | POA: Insufficient documentation

## 2016-03-15 DIAGNOSIS — M79671 Pain in right foot: Secondary | ICD-10-CM

## 2016-03-15 DIAGNOSIS — E119 Type 2 diabetes mellitus without complications: Secondary | ICD-10-CM | POA: Insufficient documentation

## 2016-03-15 DIAGNOSIS — M7989 Other specified soft tissue disorders: Secondary | ICD-10-CM | POA: Diagnosis present

## 2016-03-15 DIAGNOSIS — L03115 Cellulitis of right lower limb: Secondary | ICD-10-CM | POA: Diagnosis not present

## 2016-03-15 DIAGNOSIS — M79672 Pain in left foot: Secondary | ICD-10-CM

## 2016-03-15 LAB — GLUCOSE, CAPILLARY: Glucose-Capillary: 151 mg/dL — ABNORMAL HIGH (ref 65–99)

## 2016-03-15 MED ORDER — CLINDAMYCIN HCL 150 MG PO CAPS
300.0000 mg | ORAL_CAPSULE | Freq: Once | ORAL | Status: AC
  Administered 2016-03-15: 300 mg via ORAL
  Filled 2016-03-15: qty 2

## 2016-03-15 MED ORDER — FUROSEMIDE 20 MG PO TABS: 20.0000 mg | ORAL_TABLET | Freq: Every day | ORAL | 0 refills | Status: DC

## 2016-03-15 MED ORDER — ETODOLAC 200 MG PO CAPS: 200.0000 mg | ORAL_CAPSULE | Freq: Three times a day (TID) | ORAL | 0 refills | Status: DC

## 2016-03-15 MED ORDER — CLINDAMYCIN HCL 300 MG PO CAPS: 300.0000 mg | ORAL_CAPSULE | Freq: Three times a day (TID) | ORAL | 0 refills | Status: AC

## 2016-03-15 MED ORDER — FUROSEMIDE 40 MG PO TABS
40.0000 mg | ORAL_TABLET | Freq: Once | ORAL | Status: AC
  Administered 2016-03-15: 40 mg via ORAL
  Filled 2016-03-15: qty 1

## 2016-03-15 MED ORDER — OXYCODONE-ACETAMINOPHEN 5-325 MG PO TABS
1.0000 | ORAL_TABLET | Freq: Once | ORAL | Status: AC
  Administered 2016-03-15: 1 via ORAL
  Filled 2016-03-15: qty 1

## 2016-03-15 NOTE — Discharge Instructions (Signed)
Please follow up with your primary care physician.

## 2016-03-15 NOTE — ED Notes (Signed)
Report to Ashley, RN

## 2016-03-15 NOTE — ED Notes (Signed)
Pt states pain to left foot after dropping a crockpot behind toes on top of the foot x1 month ago. Pt states unable to move 5th toe.   Pt presents with edema bilaterally to lower extremities, moreso to right side.   Pt also dealing with shingles out break to left lower leg and states is getting worse x3weeks.   Pt c/o HA.   Pt states edema to abdominal area as well as hands.   Pt states uncontrolled CBG by PCP, denies regular insulin usage.   Pt states unable to see PCP for 1 week resulting in need for emergency care this morning.

## 2016-03-15 NOTE — ED Triage Notes (Signed)
Pt presents to ED with c/o bilateral lower leg pain d/t swelling. Pt states she was seen here for edema on 2/26 and r/x'd a fluid pill, which helped some, but states she cannot see her PCP to get a refill. Pt reports shooting pains down both calves, and that she can't move her little toes.

## 2016-03-15 NOTE — ED Provider Notes (Signed)
[facility] Emergency Department Provider Note   ____________________________________________   First MD Initiated Contact with Patient 03/15/16 [phone]     (approximate)  I have reviewed the triage vital signs and the nursing notes.   HISTORY  Chief Complaint Foot Pain    HPI Caitlyn Fuller is a 41 y.o. female who comes into the hospital today with pain in her legs. The patient was here for 426 because she's had fluid on her legs. She reports that currently her legs are killing her. She was given a fluid pill which helped but the patient ran out of the pill 2-3 days ago. She states that she dropped a crock pot on her toes approximately one month ago and she has been having bruising and pain to the left lateral foot. The patient reports that she can't separate her toes because of pain. She also feels a knot at the top of her foot. She reports also that when she is at work she stands for 9 hours. The patient reports that after being at work or legs are more swollen and the pain is worse. She feels as though her legs are going to burst open. The patient also reports that she has shingles on her right leg and the skin is so tender that she can hardly touch her leg. The patient couldn't tolerate the pain anymore and was unable to get an appointment with her primary care physician so she came into the hospital today for evaluation.   Past Medical History:  Diagnosis Date  . Asthma   . Diabetes mellitus without complication (HCC)   . Hernia   . Hypertension   . Kidney infection   . Shingles     Patient Active Problem List   Diagnosis Date Noted  . Hyperglycemia without ketosis   . Chronic pain   . Herpes zoster   . Renal mass, left   . Essential hypertension   . Hyperglycemia 11/01/2014    Past Surgical History:  Procedure Laterality Date  . ABDOMINAL SURGERY    . CESAREAN SECTION    . CHOLECYSTECTOMY    . colonscopy    . TUBAL LIGATION       Prior to Admission medications   Medication Sig Start Date End Date Taking? Authorizing Provider  albuterol (PROVENTIL HFA;VENTOLIN HFA) 108 (90 BASE) MCG/ACT inhaler Inhale 2 puffs into the lungs every 4 (four) hours as needed for wheezing.    Historical Provider, MD  albuterol (PROVENTIL HFA;VENTOLIN HFA) 108 (90 Base) MCG/ACT inhaler Inhale 2 puffs into the lungs every 6 (six) hours as needed for wheezing or shortness of breath. 01/12/16   Darci Current, MD  albuterol (PROVENTIL) (2.5 MG/3ML) 0.083% nebulizer solution Take 3 mLs (2.5 mg total) by nebulization every 4 (four) hours as needed for wheezing or shortness of breath. 01/12/16   Darci Current, MD  chlorpheniramine-HYDROcodone [facility] PENNKINETIC ER) 10-8 MG/5ML SUER Take 5 mLs by mouth every 12 (twelve) hours as needed for cough. Patient not taking: Reported on 03/06/2016 01/12/16   Darci Current, MD  clindamycin (CLEOCIN) 300 MG capsule Take 1 capsule (300 mg total) by mouth 3 (three) times daily. 03/15/16 03/25/16  Rebecka Apley, MD  etodolac (LODINE) 200 MG capsule Take 1 capsule (200 mg total) by mouth every 8 (eight) hours. 03/15/16   Rebecka Apley, MD  furosemide (LASIX) 20 MG tablet Take 1 tablet (20 mg total) by mouth daily. 03/06/16 03/06/17  Minna Antis, MD  furosemide (LASIX) 20 MG tablet Take 1 tablet (20 mg total) by mouth daily. 03/15/16 03/15/17  Rebecka ApleyAllison P Webster, MD  insulin NPH-regular Human (NOVOLIN 70/30) (70-30) 100 UNIT/ML injection Inject 52 units under the skin in the morning and 48 units under the skin at night Patient taking differently: Inject 50 units under the skin in the morning and 40 units under the skin at night 06/13/15   Rebecka ApleyAllison P Webster, MD  lisinopril (PRINIVIL,ZESTRIL) 10 MG tablet Take 10 mg by mouth daily.    Historical Provider, MD  naproxen (NAPROSYN) 500 MG tablet Take 1 tablet (500 mg total) by mouth 2 (two) times daily with a meal. 04/26/15   Jene Everyobert Kinner, MD  norethindrone-ethinyl  estradiol (CYCLAFEM,ALYACEN) 0.5/0.75/1-35 MG-MCG tablet Take 1 tablet by mouth daily.    Historical Provider, MD  oxyCODONE (ROXICODONE) 15 MG immediate release tablet Take 15 mg by mouth every 4 (four) hours.     Historical Provider, MD  oxyCODONE-acetaminophen (PERCOCET) 7.5-325 MG tablet Take 1 tablet by mouth every 4 (four) hours as needed for severe pain. Patient not taking: Reported on 03/06/2016 10/05/15 10/04/16  Joni Reiningonald K Smith, PA-C    Allergies Gabapentin; Morphine and related; Darvocet [propoxyphene n-acetaminophen]; Toradol [ketorolac tromethamine]; and Ultram [tramadol]  No family history on file.  Social History Social History  Substance Use Topics  . Smoking status: Current Every Day Smoker    Packs/day: 0.50  . Smokeless tobacco: Never Used  . Alcohol use No    Review of Systems Constitutional: No fever/chills Eyes: No visual changes. ENT: No sore throat. Cardiovascular: Denies chest pain. Respiratory: Denies shortness of breath. Gastrointestinal: No abdominal pain.  No nausea, no vomiting.  No diarrhea.  No constipation. Genitourinary: Negative for dysuria. Musculoskeletal: Leg pain Skin: Open sores to right leg Neurological: Negative for headaches, focal weakness or numbness. Lymph: Edema  10-point ROS otherwise negative.  ____________________________________________   PHYSICAL EXAM:  VITAL SIGNS: ED Triage Vitals  Enc Vitals Group     BP 03/15/16 0337 (!) 151/93     Pulse Rate 03/15/16 0337 98     Resp 03/15/16 0337 16     Temp 03/15/16 0337 98.6 F (37 C)     Temp Source 03/15/16 0337 Oral     SpO2 03/15/16 0337 98 %     Weight 03/15/16 0335 232 lb (105.2 kg)     Height 03/15/16 0335 5' (1.524 m)     Head Circumference --      Peak Flow --      Pain Score 03/15/16 0335 10     Pain Loc --      Pain Edu? --      Excl. in GC? --     Constitutional: Alert and oriented. Well appearing and in moderate distress. Eyes: Conjunctivae are normal.  PERRL. EOMI. Head: Atraumatic. Nose: No congestion/rhinnorhea. Mouth/Throat: Mucous membranes are moist.  Oropharynx non-erythematous. Cardiovascular: Normal rate, regular rhythm. Grossly normal heart sounds.  Good peripheral circulation. Respiratory: Normal respiratory effort.  No retractions. Lungs CTAB. Gastrointestinal: Soft and nontender. No distention. Positive bowel sounds Musculoskeletal: B/L lower extremity edema Neurologic:  Normal speech and language.  Skin:  Skin is warm, dry some mild erythema to the lateral leg with some open wounds.  Psychiatric: Mood and affect are normal.   ____________________________________________   LABS (all labs ordered are listed, but only abnormal results are displayed)  Labs Reviewed  GLUCOSE, CAPILLARY - Abnormal; Notable for the following:       Result  Value   Glucose-Capillary 151 (*)    All other components within normal limits  CBG MONITORING, ED   ____________________________________________  EKG  none ____________________________________________  RADIOLOGY  none ____________________________________________   PROCEDURES  Procedure(s) performed: None  Procedures  Critical Care performed: No  ____________________________________________   INITIAL IMPRESSION / ASSESSMENT AND PLAN / ED COURSE  Pertinent labs & imaging results that were available during my care of the patient were reviewed by me and considered in my medical decision making (see chart for details).  This is a 41 year old female who comes into the hospital today with some lower extremity pain. The patient has had some swelling which is worse when she walks around. The patient also has some sores to her leg that tender. I will give the patient a dose of Lasix 40 mg orally as well as some clindamycin as I'm concerned about a possible infection. I will also give the patient dose of Percocet for her pain and she'll be reassessed.     After the medication  the patient was sleeping without any difficulty. She'll be discharged home. She is encouraged to follow up with her primary care physician for further evaluation and treatment of these symptoms. ____________________________________________   FINAL CLINICAL IMPRESSION(S) / ED DIAGNOSES  Final diagnoses:  Peripheral edema  Cellulitis of right lower extremity  Pain in both feet      NEW MEDICATIONS STARTED DURING THIS VISIT:  New Prescriptions   CLINDAMYCIN (CLEOCIN) 300 MG CAPSULE    Take 1 capsule (300 mg total) by mouth 3 (three) times daily.   ETODOLAC (LODINE) 200 MG CAPSULE    Take 1 capsule (200 mg total) by mouth every 8 (eight) hours.   FUROSEMIDE (LASIX) 20 MG TABLET    Take 1 tablet (20 mg total) by mouth daily.     Note:  This document was prepared using Dragon voice recognition software and may include unintentional dictation errors.    Rebecka Apley, MD 03/15/16 [phone]

## 2016-04-04 ENCOUNTER — Emergency Department (HOSPITAL_COMMUNITY)
Admission: EM | Admit: 2016-04-04 | Discharge: 2016-04-04 | Disposition: A | Discharge status: Home / Self Care | Payer: Medicaid Other | Attending: Emergency Medicine | Admitting: Emergency Medicine

## 2016-04-04 ENCOUNTER — Encounter (HOSPITAL_COMMUNITY): Payer: Self-pay | Admitting: Emergency Medicine

## 2016-04-04 DIAGNOSIS — J45909 Unspecified asthma, uncomplicated: Secondary | ICD-10-CM | POA: Insufficient documentation

## 2016-04-04 DIAGNOSIS — E119 Type 2 diabetes mellitus without complications: Secondary | ICD-10-CM | POA: Insufficient documentation

## 2016-04-04 DIAGNOSIS — Z794 Long term (current) use of insulin: Secondary | ICD-10-CM | POA: Insufficient documentation

## 2016-04-04 DIAGNOSIS — F17200 Nicotine dependence, unspecified, uncomplicated: Secondary | ICD-10-CM | POA: Insufficient documentation

## 2016-04-04 DIAGNOSIS — I10 Essential (primary) hypertension: Secondary | ICD-10-CM | POA: Insufficient documentation

## 2016-04-04 DIAGNOSIS — Z5321 Procedure and treatment not carried out due to patient leaving prior to being seen by health care provider: Secondary | ICD-10-CM | POA: Insufficient documentation

## 2016-04-04 DIAGNOSIS — R1013 Epigastric pain: Secondary | ICD-10-CM | POA: Diagnosis not present

## 2016-04-04 LAB — URINALYSIS, ROUTINE W REFLEX MICROSCOPIC
BILIRUBIN URINE: NEGATIVE
GLUCOSE, UA: 50 mg/dL — AB
HGB URINE DIPSTICK: NEGATIVE
KETONES UR: NEGATIVE mg/dL
Leukocytes, UA: NEGATIVE
Nitrite: NEGATIVE
PH: 6 (ref 5.0–8.0)
Protein, ur: NEGATIVE mg/dL
Specific Gravity, Urine: 1.011 (ref 1.005–1.030)

## 2016-04-04 LAB — BASIC METABOLIC PANEL
Anion gap: 8 (ref 5–15)
BUN: 9 mg/dL (ref 6–20)
CHLORIDE: 98 mmol/L — AB (ref 101–111)
CO2: 28 mmol/L (ref 22–32)
CREATININE: 0.64 mg/dL (ref 0.44–1.00)
Calcium: 8.6 mg/dL — ABNORMAL LOW (ref 8.9–10.3)
GFR calc Af Amer: >60 mL/min (ref >60)
GFR calc non Af Amer: >60 mL/min (ref >60)
Glucose, Bld: 215 mg/dL — ABNORMAL HIGH (ref 65–99)
Potassium: 4.1 mmol/L (ref 3.5–5.1)
SODIUM: 134 mmol/L — AB (ref 135–145)

## 2016-04-04 LAB — CBC
HCT: 42.4 % (ref 36.0–46.0)
Hemoglobin: 13.2 g/dL (ref 12.0–15.0)
MCH: 25.7 pg — ABNORMAL LOW (ref 26.0–34.0)
MCHC: 31.1 g/dL (ref 30.0–36.0)
MCV: 82.7 fL (ref 78.0–100.0)
PLATELETS: 243 10*3/uL (ref 150–400)
RBC: 5.13 MIL/uL — ABNORMAL HIGH (ref 3.87–5.11)
RDW: 14.9 % (ref 11.5–15.5)
WBC: 11.2 10*3/uL — AB (ref 4.0–10.5)

## 2016-04-04 LAB — LIPASE, BLOOD

## 2016-04-04 LAB — I-STAT TROPONIN, ED: Troponin i, poc: 0 ng/mL (ref 0.00–0.08)

## 2016-04-04 NOTE — ED Triage Notes (Signed)
Pt presents via [facility] for CP, epigastric abd pain, bilat flank pain, and back pain; pt states the pain started 4-5 days ago; pt reports hx of diverticulosis and kidney infections; pt drowsy in triage and requires constant stimulation to keep her awake- pt denies taking anything that would potentially make her drowsy

## 2016-04-04 NOTE — ED Notes (Signed)
Pt states there is too long of a wait and that she is not waiting any longer, instructed to the patient that we are doing everything we can to get everyone back as quickly as possible and that I do not advise her to leave.  Asked if there was anything I could do to make her more comfortable and she replied with no and that she was leaving and not waiting.

## 2016-04-07 ENCOUNTER — Emergency Department (HOSPITAL_COMMUNITY)
Admission: EM | Admit: 2016-04-07 | Discharge: 2016-04-07 | Discharge status: Against Medical Advice | Payer: Medicaid Other | Attending: Emergency Medicine | Admitting: Emergency Medicine

## 2016-04-07 ENCOUNTER — Emergency Department (HOSPITAL_COMMUNITY): Payer: Medicaid Other

## 2016-04-07 ENCOUNTER — Encounter (HOSPITAL_COMMUNITY): Payer: Self-pay | Admitting: Emergency Medicine

## 2016-04-07 DIAGNOSIS — G8929 Other chronic pain: Secondary | ICD-10-CM

## 2016-04-07 DIAGNOSIS — M545 Low back pain, unspecified: Secondary | ICD-10-CM

## 2016-04-07 DIAGNOSIS — Z79899 Other long term (current) drug therapy: Secondary | ICD-10-CM | POA: Diagnosis not present

## 2016-04-07 DIAGNOSIS — J45909 Unspecified asthma, uncomplicated: Secondary | ICD-10-CM | POA: Diagnosis not present

## 2016-04-07 DIAGNOSIS — F17200 Nicotine dependence, unspecified, uncomplicated: Secondary | ICD-10-CM | POA: Diagnosis not present

## 2016-04-07 DIAGNOSIS — R109 Unspecified abdominal pain: Secondary | ICD-10-CM | POA: Diagnosis not present

## 2016-04-07 DIAGNOSIS — I10 Essential (primary) hypertension: Secondary | ICD-10-CM | POA: Insufficient documentation

## 2016-04-07 DIAGNOSIS — R52 Pain, unspecified: Secondary | ICD-10-CM

## 2016-04-07 DIAGNOSIS — Z794 Long term (current) use of insulin: Secondary | ICD-10-CM | POA: Diagnosis not present

## 2016-04-07 DIAGNOSIS — E119 Type 2 diabetes mellitus without complications: Secondary | ICD-10-CM | POA: Diagnosis not present

## 2016-04-07 LAB — CBC
HEMATOCRIT: 41.9 % (ref 36.0–46.0)
HEMOGLOBIN: 13.1 g/dL (ref 12.0–15.0)
MCH: 25.5 pg — AB (ref 26.0–34.0)
MCHC: 31.3 g/dL (ref 30.0–36.0)
MCV: 81.7 fL (ref 78.0–100.0)
Platelets: 293 10*3/uL (ref 150–400)
RBC: 5.13 MIL/uL — ABNORMAL HIGH (ref 3.87–5.11)
RDW: 14.6 % (ref 11.5–15.5)
WBC: 13.3 10*3/uL — ABNORMAL HIGH (ref 4.0–10.5)

## 2016-04-07 LAB — BASIC METABOLIC PANEL
ANION GAP: 7 (ref 5–15)
BUN: 7 mg/dL (ref 6–20)
CALCIUM: 8.6 mg/dL — AB (ref 8.9–10.3)
CO2: 28 mmol/L (ref 22–32)
Chloride: 102 mmol/L (ref 101–111)
Creatinine, Ser: 0.69 mg/dL (ref 0.44–1.00)
GFR calc Af Amer: >60 mL/min (ref >60)
GFR calc non Af Amer: >60 mL/min (ref >60)
GLUCOSE: 207 mg/dL — AB (ref 65–99)
Potassium: 4.1 mmol/L (ref 3.5–5.1)
Sodium: 137 mmol/L (ref 135–145)

## 2016-04-07 LAB — URINALYSIS, ROUTINE W REFLEX MICROSCOPIC
Bilirubin Urine: NEGATIVE
GLUCOSE, UA: NEGATIVE mg/dL
HGB URINE DIPSTICK: NEGATIVE
KETONES UR: NEGATIVE mg/dL
LEUKOCYTES UA: NEGATIVE
Nitrite: NEGATIVE
PH: 6 (ref 5.0–8.0)
PROTEIN: NEGATIVE mg/dL
Specific Gravity, Urine: 1.019 (ref 1.005–1.030)

## 2016-04-07 LAB — HEPATIC FUNCTION PANEL
ALT: 16 U/L (ref 14–54)
AST: 19 U/L (ref 15–41)
Albumin: 3.2 g/dL — ABNORMAL LOW (ref 3.5–5.0)
Alkaline Phosphatase: 67 U/L (ref 38–126)
Bilirubin, Direct: <0.1 mg/dL — ABNORMAL LOW (ref 0.1–0.5)
Total Bilirubin: 0.6 mg/dL (ref 0.3–1.2)
Total Protein: 6.5 g/dL (ref 6.5–8.1)

## 2016-04-07 LAB — I-STAT VENOUS BLOOD GAS, ED
Acid-Base Excess: 1 mmol/L (ref 0.0–2.0)
Bicarbonate: 25.8 mmol/L (ref 20.0–28.0)
O2 Saturation: 74 %
TCO2: 27 mmol/L (ref 0–100)
pCO2, Ven: 42.9 mmHg — ABNORMAL LOW (ref 44.0–60.0)
pH, Ven: 7.387 (ref 7.250–7.430)
pO2, Ven: 40 mmHg (ref 32.0–45.0)

## 2016-04-07 LAB — LIPASE, BLOOD: Lipase: 11 U/L (ref 11–51)

## 2016-04-07 LAB — PREGNANCY, URINE: Preg Test, Ur: NEGATIVE

## 2016-04-07 MED ORDER — HYDROCODONE-ACETAMINOPHEN 5-325 MG PO TABS: 1.0000 | ORAL_TABLET | Freq: Four times a day (QID) | ORAL | 0 refills | Status: DC | PRN

## 2016-04-07 MED ORDER — ONDANSETRON HCL 4 MG/2ML IJ SOLN
4.0000 mg | Freq: Once | INTRAMUSCULAR | Status: AC
  Administered 2016-04-07: 4 mg via INTRAVENOUS
  Filled 2016-04-07: qty 2

## 2016-04-07 MED ORDER — HYDROMORPHONE HCL 1 MG/ML IJ SOLN: 1.0000 mg | Freq: Once | INTRAMUSCULAR | Status: DC

## 2016-04-07 MED ORDER — SODIUM CHLORIDE 0.9 % IV BOLUS (SEPSIS)
1000.0000 mL | Freq: Once | INTRAVENOUS | Status: AC
  Administered 2016-04-07: 1000 mL via INTRAVENOUS

## 2016-04-07 MED ORDER — FENTANYL CITRATE (PF) 100 MCG/2ML IJ SOLN
100.0000 ug | Freq: Once | INTRAMUSCULAR | Status: AC
  Administered 2016-04-07: 100 ug via INTRAVENOUS
  Filled 2016-04-07: qty 2

## 2016-04-07 MED ORDER — LORAZEPAM 2 MG/ML IJ SOLN: 0.5000 mg | Freq: Once | INTRAMUSCULAR | Status: DC

## 2016-04-07 MED ORDER — HYDROMORPHONE HCL 1 MG/ML IJ SOLN
1.0000 mg | Freq: Once | INTRAMUSCULAR | Status: AC
  Administered 2016-04-07: 1 mg via INTRAVENOUS
  Filled 2016-04-07: qty 1

## 2016-04-07 MED ORDER — OXYCODONE-ACETAMINOPHEN 5-325 MG PO TABS
2.0000 | ORAL_TABLET | Freq: Once | ORAL | Status: AC
  Administered 2016-04-07: 2 via ORAL
  Filled 2016-04-07: qty 2

## 2016-04-07 NOTE — ED Notes (Signed)
Ebbie Ridge, PA at bedside speaking to patient about MRI.

## 2016-04-07 NOTE — ED Provider Notes (Signed)
Plains of midthoracic and lower back pain nonradiating onset 4 days ago. She reports temperature 102.1 2 days ago. Pain is worse with moving or changing positions or urinating. No loss of bladder or bowel control. Improved with remains still. She is treated herself with Percocet, without relief. On exam she appears mildly uncomfortable. [place] to come score 15 abdomen morbidly obese, nontender back without point tenderness she has pain at lower thoracic and high lumbar area when she changes position in bed. DTRs symmetric bilaterally at knee jerk ankle jerk and biceps toes or going bilaterally. MRI scans ordered of thoracic and lumbar spine is concern for discitis or epidural abscess given immunocompromise state a history of fever   Doug Sou, MD 04/07/16 [phone]

## 2016-04-07 NOTE — Discharge Instructions (Signed)
Return here for any worsening in your condition.  At some point he may need the MRI to further evaluate this issue

## 2016-04-07 NOTE — ED Notes (Signed)
Before patient was transported to MRI, pt told this RN as well as Dr. Shela Commons that she was not claustrophobic. Pt taken to MRI and the MRI tech called this RN to state that pt is very nervous and requesting something to help calm her down for MRI. Dr. Shela Commons informed and ordered 0.5 mg of Ativan IV. Before this RN had an opportunity to grab the medication the MRI tech called back to inform this RN that the patient is refusing MRI unless she was given something to "knock her out." MRI tech stated they would be bringing the patient back to her room. Dr. Shela Commons informed.

## 2016-04-07 NOTE — ED Provider Notes (Signed)
[facility]-EMERGENCY DEPT Provider Note   CSN: [id number] Arrival date & time: 04/07/16  0804     History   Chief Complaint Chief Complaint  Patient presents with  . Back Pain    HPI Caitlyn Fuller is a 41 y.o. female.  HPI Patient presents to the emergency department with dysuria, lower back pain, flank pain.  Patient states that the symptoms started 5 days ago.  She states that she was seen 3 days ago and left before she was seen.  Patient states that nothing seems make the condition better or worse.  She states she does have chronic back pain and is unsure if this is related to her chronic pain, or there is no new component to this.  Patient states that she did not take any medications prior to arrival.  Patient is also complaining of some mid abdominal pain as well. The patient denies chest pain, shortness of breath, headache,blurred vision, neck pain, fever, cough, weakness, numbness, dizziness, anorexia, edema, nausea, vomiting, diarrhea, rash,  hematemesis, bloody stool, near syncope, or syncope. Past Medical History:  Diagnosis Date  . Asthma   . Diabetes mellitus without complication (HCC)   . Hernia   . Hypertension   . Kidney infection   . Shingles     Patient Active Problem List   Diagnosis Date Noted  . Hyperglycemia without ketosis   . Chronic pain   . Herpes zoster   . Renal mass, left   . Essential hypertension   . Hyperglycemia 11/01/2014    Past Surgical History:  Procedure Laterality Date  . ABDOMINAL SURGERY    . CESAREAN SECTION    . CHOLECYSTECTOMY    . colonscopy    . TUBAL LIGATION      OB History    No data available       Home Medications    Prior to Admission medications   Medication Sig Start Date End Date Taking? Authorizing Provider  albuterol (PROVENTIL HFA;VENTOLIN HFA) 108 (90 Base) MCG/ACT inhaler Inhale 2 puffs into the lungs every 6 (six) hours as needed for wheezing or shortness of breath. 01/12/16  Yes Darci Current, MD    albuterol (PROVENTIL) (2.5 MG/3ML) 0.083% nebulizer solution Take 3 mLs (2.5 mg total) by nebulization every 4 (four) hours as needed for wheezing or shortness of breath. 01/12/16  Yes Darci Current, MD  furosemide (LASIX) 20 MG tablet Take 1 tablet (20 mg total) by mouth daily. 03/06/16 03/06/17 Yes Minna Antis, MD  insulin NPH-regular Human (NOVOLIN 70/30) (70-30) 100 UNIT/ML injection Inject 52 units under the skin in the morning and 48 units under the skin at night Patient taking differently: Inject 38-48 Units into the skin 2 (two) times daily with a meal. Inject 48 units under the skin in the morning and 38 units under the skin at night 06/13/15  Yes Rebecka Apley, MD  lisinopril (PRINIVIL,ZESTRIL) 10 MG tablet Take 10 mg by mouth daily.   Yes Historical Provider, MD  oxyCODONE (ROXICODONE) 15 MG immediate release tablet Take 15 mg by mouth every 4 (four) hours.    Yes Historical Provider, MD  etodolac (LODINE) 200 MG capsule Take 1 capsule (200 mg total) by mouth every 8 (eight) hours. Patient not taking: Reported on 04/07/2016 03/15/16   Rebecka Apley, MD  HYDROcodone-acetaminophen (NORCO/VICODIN) 5-325 MG tablet Take 1 tablet by mouth every 6 (six) hours as needed for moderate pain. 04/07/16   Charlestine Night, PA-C  naproxen (NAPROSYN) 500 MG  tablet Take 1 tablet (500 mg total) by mouth 2 (two) times daily with a meal. Patient not taking: Reported on 04/07/2016 04/26/15   Jene Every, MD  oxyCODONE-acetaminophen (PERCOCET) 7.5-325 MG tablet Take 1 tablet by mouth every 4 (four) hours as needed for severe pain. Patient not taking: Reported on 03/06/2016 10/05/15 10/04/16  Joni Reining, PA-C    Family History No family history on file.  Social History Social History  Substance Use Topics  . Smoking status: Current Every Day Smoker    Packs/day: 0.50  . Smokeless tobacco: Never Used  . Alcohol use No     Allergies   Gabapentin; Morphine and related; Darvocet  [propoxyphene n-acetaminophen]; Toradol [ketorolac tromethamine]; and Ultram [tramadol]   Review of Systems Review of Systems  All other systems negative except as documented in the HPI. All pertinent positives and negatives as reviewed in the HPI. Physical Exam Updated Vital Signs BP (!) 140/99 (BP Location: Left Arm)   Pulse 79   Temp 98.7 F (37.1 C) (Oral)   Resp 18   Ht  (1.575 m)   Wt 106.6 kg   LMP 03/27/2016   SpO2 96%   BMI 42.98 kg/m   Physical Exam  Constitutional: She is oriented to person, place, and time. She appears well-developed and well-nourished. No distress.  HENT:  Head: Normocephalic and atraumatic.  Mouth/Throat: Oropharynx is clear and moist.  Eyes: Pupils are equal, round, and reactive to light.  Neck: Normal range of motion. Neck supple.  Cardiovascular: Normal rate, regular rhythm and normal heart sounds.  Exam reveals no gallop and no friction rub.   No murmur heard. Pulmonary/Chest: Effort normal and breath sounds normal. No respiratory distress. She has no wheezes.  Abdominal: Soft. Bowel sounds are normal. She exhibits no distension and no mass. There is tenderness. There is no rebound and no guarding.  Musculoskeletal:       Lumbar back: She exhibits tenderness and pain. She exhibits normal range of motion, no bony tenderness, no swelling, no edema, no deformity, no spasm and normal pulse.       Back:  Neurological: She is alert and oriented to person, place, and time. She displays normal reflexes. No sensory deficit. She exhibits normal muscle tone. Coordination normal.  Skin: Skin is warm and dry. Capillary refill takes less than 2 seconds. No rash noted. No erythema.  Psychiatric: She has a normal mood and affect. Her behavior is normal.  Nursing note and vitals reviewed.    ED Treatments / Results  Labs (all labs ordered are listed, but only abnormal results are displayed) Labs Reviewed  URINALYSIS, ROUTINE W REFLEX MICROSCOPIC -  Abnormal; Notable for the following:       Result Value   APPearance HAZY (*)    All other components within normal limits  BASIC METABOLIC PANEL - Abnormal; Notable for the following:    Glucose, Bld 207 (*)    Calcium 8.6 (*)    All other components within normal limits  CBC - Abnormal; Notable for the following:    WBC 13.3 (*)    RBC 5.13 (*)    MCH 25.5 (*)    All other components within normal limits  HEPATIC FUNCTION PANEL - Abnormal; Notable for the following:    Albumin 3.2 (*)    Bilirubin, Direct <0.1 (*)    All other components within normal limits  I-STAT VENOUS BLOOD GAS, ED - Abnormal; Notable for the following:    pCO2,  Ven 42.9 (*)    All other components within normal limits  PREGNANCY, URINE  LIPASE, BLOOD    EKG  EKG Interpretation None       Radiology Dg Chest 2 View  Result Date: 04/07/2016 CLINICAL DATA:  Cough for 4 days EXAM: CHEST  2 VIEW COMPARISON:  03/06/2016 FINDINGS: Low lung volumes. Heart and mediastinal contours are within normal limits. No focal opacities or effusions. No acute bony abnormality. IMPRESSION: Low volumes.  No active disease. Electronically Signed   By: Charlett Nose M.D.   On: 04/07/2016 09:45   Ct Renal Stone Study  Result Date: 04/07/2016 CLINICAL DATA:  Flank pain for several days EXAM: CT ABDOMEN AND PELVIS WITHOUT CONTRAST TECHNIQUE: Multidetector CT imaging of the abdomen and pelvis was performed following the standard protocol without IV contrast. COMPARISON:  12/27/2014 FINDINGS: Lower chest: No acute abnormality. Hepatobiliary: No focal liver abnormality is seen. Status post cholecystectomy. No biliary dilatation. Pancreas: Unremarkable. No pancreatic ductal dilatation or surrounding inflammatory changes. Spleen: Normal in size without focal abnormality. Adrenals/Urinary Tract: The adrenal glands are within normal limits. The kidneys are well visualized without renal calculi or obstructive changes. The ureters are well  visualized to the level of the urinary bladder. The bladder is partially distended. A hypodensity is again seen in the lower pole of the left kidney stable from the prior exam likely representing a cyst. Stomach/Bowel: Stomach is within normal limits. Appendix appears normal. No evidence of bowel wall thickening, distention, or inflammatory changes. Vascular/Lymphatic: No significant vascular findings are present. No enlarged abdominal or pelvic lymph nodes. Reproductive: Uterus and bilateral adnexa are unremarkable. Other: There are changes consistent with prior anterior abdominal wall hernia repair. This is stable from the prior exam. Just superior to this however there are 2 fat containing anterior abdominal wall hernias which are stable from the previous exam. Musculoskeletal: No acute abnormality noted. IMPRESSION: No renal calculi are identified.  No obstructive changes seen. Stable anterior abdominal wall hernias with fat within. No bowel herniation is seen. Prior abdominal wall surgery is seen. No acute abnormality noted. Electronically Signed   By: Alcide Clever M.D.   On: 04/07/2016 10:44    Procedures Procedures (including critical care time)  Medications Ordered in ED Medications  LORazepam (ATIVAN) injection 0.5 mg (0.5 mg Intravenous Not Given 04/07/16 1326)  HYDROmorphone (DILAUDID) injection 1 mg (not administered)  sodium chloride 0.9 % bolus 1,000 mL (0 mLs Intravenous Stopped 04/07/16 1155)  fentaNYL (SUBLIMAZE) injection 100 mcg (100 mcg Intravenous Given 04/07/16 0917)  ondansetron (ZOFRAN) injection 4 mg (4 mg Intravenous Given 04/07/16 0917)  HYDROmorphone (DILAUDID) injection 1 mg (1 mg Intravenous Given 04/07/16 1153)     Initial Impression / Assessment and Plan / ED Course  I have reviewed the triage vital signs and the nursing notes.  Pertinent labs & imaging results that were available during my care of the patient were reviewed by me and considered in my medical decision  making (see chart for details).     Patient is unable to tolerate having the MRI performed.   the MRI was ordered due to the possibility, albeit Slim that she has a epidural abscess or discitis.  The patient has good range of motion of her lower extremities, and strength.  She also has normal gait.  Patient does not want to be admitted to the hospital for further sedation and MRI.  The patient is advised to return here for any worsening in her condition.  She  does not have any neurological deficits noted on exam.  Patient agrees the plan and all questions were answered.  The patient signed out AMA due to the fact that she did not want the MRI.  I explained the risks and benefits of this procedure and not having it performed.  I did advise her that this could lead to worsening in her condition, worsening infectious process or death.  Patient states she did not want be admitted.  Patient voiced an understanding of the risk of not having the test performed  Final Clinical Impressions(s) / ED Diagnoses   Final diagnoses:  Flank pain  Pain  Chronic bilateral low back pain without sciatica    New Prescriptions Current Discharge Medication List    START taking these medications   Details  HYDROcodone-acetaminophen (NORCO/VICODIN) 5-325 MG tablet Take 1 tablet by mouth every 6 (six) hours as needed for moderate pain. Qty: 10 tablet, Refills: 0         Charlestine Night, PA-C 04/07/16 1411    Doug Sou, MD 04/07/16 [phone]

## 2016-04-07 NOTE — ED Triage Notes (Signed)
Pt reports she was here a few days ago and had urine test and other labs but had to leave prior to being seen. Pt reports right and left lower back pain and urinary frequency x4 days. Pt also reports issues with blood sugar and having trouble staying awake recently.

## 2016-09-21 ENCOUNTER — Emergency Department: Payer: Medicaid Other

## 2016-09-21 ENCOUNTER — Emergency Department
Admission: EM | Admit: 2016-09-21 | Discharge: 2016-09-21 | Disposition: A | Discharge status: Home / Self Care | Payer: Medicaid Other | Attending: Emergency Medicine | Admitting: Emergency Medicine

## 2016-09-21 ENCOUNTER — Encounter: Payer: Self-pay | Admitting: Emergency Medicine

## 2016-09-21 DIAGNOSIS — Z794 Long term (current) use of insulin: Secondary | ICD-10-CM | POA: Diagnosis not present

## 2016-09-21 DIAGNOSIS — J45909 Unspecified asthma, uncomplicated: Secondary | ICD-10-CM | POA: Diagnosis not present

## 2016-09-21 DIAGNOSIS — E119 Type 2 diabetes mellitus without complications: Secondary | ICD-10-CM | POA: Insufficient documentation

## 2016-09-21 DIAGNOSIS — F17210 Nicotine dependence, cigarettes, uncomplicated: Secondary | ICD-10-CM | POA: Insufficient documentation

## 2016-09-21 DIAGNOSIS — R1084 Generalized abdominal pain: Secondary | ICD-10-CM

## 2016-09-21 DIAGNOSIS — Z79899 Other long term (current) drug therapy: Secondary | ICD-10-CM | POA: Diagnosis not present

## 2016-09-21 DIAGNOSIS — I10 Essential (primary) hypertension: Secondary | ICD-10-CM | POA: Diagnosis not present

## 2016-09-21 LAB — CBC
HCT: 42.9 % (ref 35.0–47.0)
Hemoglobin: 14.3 g/dL (ref 12.0–16.0)
MCH: 25.6 pg — ABNORMAL LOW (ref 26.0–34.0)
MCHC: 33.3 g/dL (ref 32.0–36.0)
MCV: 77 fL — AB (ref 80.0–100.0)
PLATELETS: 236 10*3/uL (ref 150–440)
RBC: 5.57 MIL/uL — AB (ref 3.80–5.20)
RDW: 15.5 % — ABNORMAL HIGH (ref 11.5–14.5)
WBC: 15.3 10*3/uL — AB (ref 3.6–11.0)

## 2016-09-21 LAB — COMPREHENSIVE METABOLIC PANEL
ALT: 21 U/L (ref 14–54)
AST: 22 U/L (ref 15–41)
Albumin: 3.2 g/dL — ABNORMAL LOW (ref 3.5–5.0)
Alkaline Phosphatase: 78 U/L (ref 38–126)
Anion gap: 9 (ref 5–15)
BUN: 15 mg/dL (ref 6–20)
CHLORIDE: 98 mmol/L — AB (ref 101–111)
CO2: 25 mmol/L (ref 22–32)
CREATININE: 0.6 mg/dL (ref 0.44–1.00)
Calcium: 8.5 mg/dL — ABNORMAL LOW (ref 8.9–10.3)
Glucose, Bld: 317 mg/dL — ABNORMAL HIGH (ref 65–99)
Potassium: 4 mmol/L (ref 3.5–5.1)
Sodium: 132 mmol/L — ABNORMAL LOW (ref 135–145)
Total Bilirubin: 0.5 mg/dL (ref 0.3–1.2)
Total Protein: 6.7 g/dL (ref 6.5–8.1)

## 2016-09-21 LAB — URINALYSIS, COMPLETE (UACMP) WITH MICROSCOPIC
GLUCOSE, UA: 500 mg/dL — AB
Hgb urine dipstick: NEGATIVE
LEUKOCYTES UA: NEGATIVE
Nitrite: NEGATIVE
PH: 5 (ref 5.0–8.0)
Protein, ur: 30 mg/dL — AB
RBC / HPF: NONE SEEN RBC/hpf (ref 0–5)
Specific Gravity, Urine: >1.03 — ABNORMAL HIGH (ref 1.005–1.030)

## 2016-09-21 LAB — LIPASE, BLOOD: LIPASE: 16 U/L (ref 11–51)

## 2016-09-21 LAB — POCT PREGNANCY, URINE: Preg Test, Ur: NEGATIVE

## 2016-09-21 LAB — LACTIC ACID, PLASMA: Lactic Acid, Venous: 1 mmol/L (ref 0.5–1.9)

## 2016-09-21 MED ORDER — DICYCLOMINE HCL 20 MG PO TABS
20.0000 mg | ORAL_TABLET | Freq: Once | ORAL | Status: AC
  Administered 2016-09-21: 20 mg via ORAL
  Filled 2016-09-21: qty 1

## 2016-09-21 MED ORDER — IOPAMIDOL (ISOVUE-300) INJECTION 61%: 100.0000 mL | Freq: Once | INTRAVENOUS | Status: DC | PRN

## 2016-09-21 MED ORDER — ONDANSETRON HCL 4 MG/2ML IJ SOLN
4.0000 mg | Freq: Once | INTRAMUSCULAR | Status: AC
  Administered 2016-09-21: 4 mg via INTRAVENOUS
  Filled 2016-09-21: qty 2

## 2016-09-21 MED ORDER — IOPAMIDOL (ISOVUE-300) INJECTION 61%
30.0000 mL | Freq: Once | INTRAVENOUS | Status: AC | PRN
  Administered 2016-09-21: 30 mL via ORAL

## 2016-09-21 MED ORDER — ONDANSETRON 4 MG PO TBDP: 4.0000 mg | ORAL_TABLET | Freq: Three times a day (TID) | ORAL | 0 refills | Status: DC | PRN

## 2016-09-21 MED ORDER — FENTANYL CITRATE (PF) 100 MCG/2ML IJ SOLN
50.0000 ug | Freq: Once | INTRAMUSCULAR | Status: AC
  Administered 2016-09-21: 50 ug via INTRAVENOUS
  Filled 2016-09-21: qty 2

## 2016-09-21 MED ORDER — SODIUM CHLORIDE 0.9 % IV BOLUS (SEPSIS)
1000.0000 mL | Freq: Once | INTRAVENOUS | Status: AC
  Administered 2016-09-21: 1000 mL via INTRAVENOUS

## 2016-09-21 MED ORDER — DICYCLOMINE HCL 20 MG PO TABS: 20.0000 mg | ORAL_TABLET | Freq: Four times a day (QID) | ORAL | 0 refills | Status: DC | PRN

## 2016-09-21 NOTE — Discharge Instructions (Signed)
1. You may take medicines as needed for abdominal pain and nausea (Bentyl/Zofran #20). 2. Return to the ER for worsening symptoms, persistent vomiting, difficulty breathing or other concerns.

## 2016-09-21 NOTE — ED Triage Notes (Addendum)
Pt presents to ED via EMS with c/o sudden onset of severe generalized abd pain. Pt states her hernia seems to be bulging more than normal. Pt tearful. States the last time she has similar symptoms her colon was inflamed and was she was admitted to the hospital. Denies vomiting or diarrhea. Pain is sharp in nature.

## 2016-09-21 NOTE — ED Notes (Signed)
Upon entering room pt snoring, pt woke up and upon reviewing discharge paperwork pt states "I dont even know whats wrong with me", This RN went to Dr. Dolores FrameSung and Dr. Dolores FrameSung states she went in and reviewed pts results, pts friend in room states "Oh yeah I remember now she did come in", pt immediately got angry and states "I am going to sue this hospital if something happens to me", pt asked for wheelchair for discharge, pt wheeled to lobby

## 2016-09-21 NOTE — ED Provider Notes (Signed)
[facility] Emergency Department Provider Note   ____________________________________________   First MD Initiated Contact with Patient 09/21/16 [phone]     (approximate)  I have reviewed the triage vital signs and the nursing notes.   HISTORY  Chief Complaint Hernia and Abdominal Pain    HPI Caitlyn Fuller is a 41 y.o. female presents to the ED from home with a chief complaint of abdominal pain. Patient reports an onset of generalized abdominal pain last evening. States she almost passed out secondary to pain. Patient has abdominal wall hernias and states her hernia seems to be bulging more than usual. Symptoms associated with nausea. Last bowel movement last evening which was normal for patient. Similar symptoms previously for which she was admitted to the hospital for inflamed colon. Denies associated fever, chills, chest pain, shortness of breath, dysuria. Nothing makes her symptoms better or worse.   Past Medical History:  Diagnosis Date  . Asthma   . Diabetes mellitus without complication (HCC)   . Hernia   . Hypertension   . Kidney infection   . Shingles     Patient Active Problem List   Diagnosis Date Noted  . Hyperglycemia without ketosis   . Chronic pain   . Herpes zoster   . Renal mass, left   . Essential hypertension   . Hyperglycemia 11/01/2014    Past Surgical History:  Procedure Laterality Date  . ABDOMINAL SURGERY    . CESAREAN SECTION    . CHOLECYSTECTOMY    . colonscopy    . TUBAL LIGATION      Prior to Admission medications   Medication Sig Start Date End Date Taking? Authorizing Provider  albuterol (PROVENTIL HFA;VENTOLIN HFA) 108 (90 Base) MCG/ACT inhaler Inhale 2 puffs into the lungs every 6 (six) hours as needed for wheezing or shortness of breath. 01/12/16   Darci CurrentBrown, [place] N, MD  albuterol (PROVENTIL) (2.5 MG/3ML) 0.083% nebulizer solution Take 3 mLs (2.5 mg total) by nebulization every 4 (four) hours as needed  for wheezing or shortness of breath. 01/12/16   Darci CurrentBrown, [place] N, MD  etodolac (LODINE) 200 MG capsule Take 1 capsule (200 mg total) by mouth every 8 (eight) hours. Patient not taking: Reported on 04/07/2016 03/15/16   Rebecka ApleyWebster, Allison P, MD  furosemide (LASIX) 20 MG tablet Take 1 tablet (20 mg total) by mouth daily. 03/06/16 03/06/17  Minna AntisPaduchowski, Kevin, MD  HYDROcodone-acetaminophen (NORCO/VICODIN) 5-325 MG tablet Take 1 tablet by mouth every 6 (six) hours as needed for moderate pain. 04/07/16   Lawyer, Cristal Deerhristopher, PA-C  insulin NPH-regular Human (NOVOLIN 70/30) (70-30) 100 UNIT/ML injection Inject 52 units under the skin in the morning and 48 units under the skin at night Patient taking differently: Inject 38-48 Units into the skin 2 (two) times daily with a meal. Inject 48 units under the skin in the morning and 38 units under the skin at night 06/13/15   Rebecka ApleyWebster, Allison P, MD  lisinopril (PRINIVIL,ZESTRIL) 10 MG tablet Take 10 mg by mouth daily.    [provider]  naproxen (NAPROSYN) 500 MG tablet Take 1 tablet (500 mg total) by mouth 2 (two) times daily with a meal. Patient not taking: Reported on 04/07/2016 04/26/15   Jene EveryKinner, Robert, MD  oxyCODONE (ROXICODONE) 15 MG immediate release tablet Take 15 mg by mouth every 4 (four) hours.     [provider]  oxyCODONE-acetaminophen (PERCOCET) 7.5-325 MG tablet Take 1 tablet by mouth every 4 (four) hours as needed for severe pain.  Patient not taking: Reported on 03/06/2016 10/05/15 10/04/16  Joni Reining, PA-C    Allergies Gabapentin; Morphine and related; Darvocet [propoxyphene n-acetaminophen]; Toradol [ketorolac tromethamine]; and Ultram [tramadol]  No family history on file.  Social History Social History  Substance Use Topics  . Smoking status: Current Every Day Smoker    Packs/day: 0.50    Types: Cigarettes  . Smokeless tobacco: Never Used  . Alcohol use No    Review of Systems  Constitutional: No fever/chills. Eyes:  No visual changes. ENT: No sore throat. Cardiovascular: Denies chest pain. Respiratory: Denies shortness of breath. Gastrointestinal: positive for abdominal pain and nausea, no vomiting.  No diarrhea.  No constipation. Genitourinary: Negative for dysuria. Musculoskeletal: Negative for back pain. Skin: Negative for rash. Neurological: Negative for headaches, focal weakness or numbness.   ____________________________________________   PHYSICAL EXAM:  VITAL SIGNS: ED Triage Vitals  Enc Vitals Group     BP 09/21/16 0119 (!) 149/88     Pulse Rate 09/21/16 0119 86     Resp 09/21/16 0119 20     Temp 09/21/16 0119 98.9 F (37.2 C)     Temp Source 09/21/16 0119 Oral     SpO2 09/21/16 0119 98 %     Weight 09/21/16 0109 240 lb (108.9 kg)     Height 09/21/16 0109 5' (1.524 m)     Head Circumference --      Peak Flow --      Pain Score 09/21/16 0108 10     Pain Loc --      Pain Edu? --      Excl. in GC? --     Constitutional: Alert and oriented. Well appearing and in mild acute distress. Tearful. Eyes: Conjunctivae are normal. PERRL. EOMI. Head: Atraumatic. Nose: No congestion/rhinnorhea. Mouth/Throat: Mucous membranes are moist.  Oropharynx non-erythematous. Neck: No stridor.   Cardiovascular: Normal rate, regular rhythm. Grossly normal heart sounds.  Good peripheral circulation. Respiratory: Normal respiratory effort.  No retractions. Lungs CTAB. Gastrointestinal: Obese. Soft and mildly tender to palpation diffusely without rebound or guarding. No distention. No abdominal bruits. No CVA tenderness. Musculoskeletal: No lower extremity tenderness nor edema.  No joint effusions. Neurologic:  Normal speech and language. No gross focal neurologic deficits are appreciated. No gait instability. Skin:  Skin is warm, dry and intact. No rash noted. Psychiatric: Mood and affect are normal. Speech and behavior are normal.  ____________________________________________   LABS (all labs  ordered are listed, but only abnormal results are displayed)  Labs Reviewed  COMPREHENSIVE METABOLIC PANEL - Abnormal; Notable for the following:       Result Value   Sodium 132 (*)    Chloride 98 (*)    Glucose, Bld 317 (*)    Calcium 8.5 (*)    Albumin 3.2 (*)    All other components within normal limits  CBC - Abnormal; Notable for the following:    WBC 15.3 (*)    RBC 5.57 (*)    MCV 77.0 (*)    MCH 25.6 (*)    RDW 15.5 (*)    All other components within normal limits  URINALYSIS, COMPLETE (UACMP) WITH MICROSCOPIC - Abnormal; Notable for the following:    Color, Urine AMBER (*)    APPearance HAZY (*)    Specific Gravity, Urine >1.030 (*)    Glucose, UA 500 (*)    Bilirubin Urine MODERATE (*)    Ketones, ur TRACE (*)    Protein, ur 30 (*)  Squamous Epithelial / LPF PRESENT (*)    Bacteria, UA FEW (*)    All other components within normal limits  LIPASE, BLOOD  LACTIC ACID, PLASMA  POC URINE PREG, ED  POCT PREGNANCY, URINE   ____________________________________________  EKG  none ____________________________________________  RADIOLOGY  Ct Abdomen Pelvis W Contrast  Result Date: 09/21/2016 CLINICAL DATA:  Sudden onset abdominal pain.  Abdominal hernia. EXAM: CT ABDOMEN AND PELVIS WITH CONTRAST TECHNIQUE: Multidetector CT imaging of the abdomen and pelvis was performed using the standard protocol following bolus administration of intravenous contrast. CONTRAST:  100 cc Isovue-300 IV COMPARISON:  CT 03/28/2016 FINDINGS: Lower chest: Chronic right middle lobe atelectasis or scarring. No pleural fluid or consolidation. Hepatobiliary: The liver is prominent size. No focal hepatic lesion. Clips in the gallbladder fossa postcholecystectomy. No biliary dilatation. Pancreas: No ductal dilatation or inflammation. Spleen: Normal in size without focal abnormality. Adrenals/Urinary Tract: No adrenal nodule. No hydronephrosis or perinephric edema. 3.1 cm cyst in the anterior mid  left kidney, unchanged from prior exam with possible internal septation. Subcentimeter low-density lesion in the lower right kidney is too small to characterize. Urinary bladder is physiologically distended. No bladder wall thickening. Stomach/Bowel: Small hiatal hernia. Mild gastric distention without wall thickening. No small bowel obstruction, inflammation or wall thickening. Lower pelvic bowel loops are likely adherent to the anterior abdominal wall in the region of postsurgical change without wall thickening, inflammation, or obstruction. Submucosal fatty infiltration of the ascending colon suggest chronic inflammatory change. No acute bowel inflammation at this time. Minimal diverticulosis at the junction of the descending and sigmoid colon without diverticulitis. Normal appendix. Vascular/Lymphatic: Normal caliber abdominal aorta. No abdominal or pelvic adenopathy. Reproductive: Uterus and bilateral adnexa are unremarkable. Ovaries symmetric in size. Other: Postsurgical changes the anterior abdominal wall with probable hernia repair with mesh. Moderate-sized complex fat containing umbilical hernia just superior to the presumed mesh. No inflammatory change. No change in appearance from prior exam. Musculoskeletal: There are no acute or suspicious osseous abnormalities. IMPRESSION: 1. Unchanged size and appearance of complex fat containing containing umbilical hernia, just superior to prior hernia repair with mesh. Probable small bowel adhesions with small bowel abutting the hernia mesh but no bowel obstruction or inflammatory change. 2. No acute abnormality in the abdomen/pelvis. Electronically Signed   By: Rubye Oaks M.D.   On: 09/21/2016 05:09    ____________________________________________   PROCEDURES  Procedure(s) performed: None  Procedures  Critical Care performed: No  ____________________________________________   INITIAL IMPRESSION / ASSESSMENT AND PLAN / ED COURSE  Pertinent  labs & imaging results that were available during my care of the patient were reviewed by me and considered in my medical decision making (see chart for details).  41 year old female who presents with generalized abdominal pain and nausea. History of hernia. Laboratory and urinalysis results remarkable for mild to moderate leukocytosis, trace ketones, hyperglycemia. Will initiate IV fluid resuscitation, obtain lactic acid, IV analgesia/antiemetic and proceed with CT abdomen/pelvis to evaluate for intra-abdominal etiology.  Clinical Course as of Sep 22 610  Thu Sep 21, 2016  0608 Awakened patient from sleep to update her of CT imaging results. Strict return precautions given. Patient verbalizes understanding and agrees with plan of care.  [JS]    Clinical Course User Index [JS] Irean Hong, MD     ____________________________________________   FINAL CLINICAL IMPRESSION(S) / ED DIAGNOSES  Final diagnoses:  Generalized abdominal pain      NEW MEDICATIONS STARTED DURING THIS VISIT:  New Prescriptions  No medications on file     Note:  This document was prepared using Dragon voice recognition software and may include unintentional dictation errors.    Irean Hong, MD 09/21/16 [phone]

## 2016-11-14 ENCOUNTER — Other Ambulatory Visit: Payer: Self-pay

## 2016-11-14 ENCOUNTER — Emergency Department: Payer: Medicaid Other

## 2016-11-14 ENCOUNTER — Emergency Department
Admission: EM | Admit: 2016-11-14 | Discharge: 2016-11-14 | Disposition: A | Discharge status: Home / Self Care | Payer: Medicaid Other | Attending: Student in an Organized Health Care Education/Training Program | Admitting: Student in an Organized Health Care Education/Training Program

## 2016-11-14 ENCOUNTER — Encounter: Payer: Self-pay | Admitting: Emergency Medicine

## 2016-11-14 DIAGNOSIS — F17210 Nicotine dependence, cigarettes, uncomplicated: Secondary | ICD-10-CM | POA: Diagnosis not present

## 2016-11-14 DIAGNOSIS — R1084 Generalized abdominal pain: Secondary | ICD-10-CM

## 2016-11-14 DIAGNOSIS — M79604 Pain in right leg: Secondary | ICD-10-CM | POA: Diagnosis not present

## 2016-11-14 DIAGNOSIS — G8929 Other chronic pain: Secondary | ICD-10-CM | POA: Diagnosis not present

## 2016-11-14 DIAGNOSIS — Z794 Long term (current) use of insulin: Secondary | ICD-10-CM | POA: Insufficient documentation

## 2016-11-14 DIAGNOSIS — Z79891 Long term (current) use of opiate analgesic: Secondary | ICD-10-CM | POA: Insufficient documentation

## 2016-11-14 DIAGNOSIS — Z79899 Other long term (current) drug therapy: Secondary | ICD-10-CM | POA: Insufficient documentation

## 2016-11-14 DIAGNOSIS — M25512 Pain in left shoulder: Secondary | ICD-10-CM | POA: Insufficient documentation

## 2016-11-14 DIAGNOSIS — J45909 Unspecified asthma, uncomplicated: Secondary | ICD-10-CM | POA: Insufficient documentation

## 2016-11-14 DIAGNOSIS — I10 Essential (primary) hypertension: Secondary | ICD-10-CM | POA: Diagnosis not present

## 2016-11-14 DIAGNOSIS — M79671 Pain in right foot: Secondary | ICD-10-CM | POA: Diagnosis not present

## 2016-11-14 DIAGNOSIS — M545 Low back pain: Secondary | ICD-10-CM | POA: Diagnosis not present

## 2016-11-14 DIAGNOSIS — E119 Type 2 diabetes mellitus without complications: Secondary | ICD-10-CM | POA: Diagnosis not present

## 2016-11-14 LAB — CBC WITH DIFFERENTIAL/PLATELET
BASOS ABS: 0.1 10*3/uL (ref 0–0.1)
BASOS PCT: 1 %
EOS ABS: 0.1 10*3/uL (ref 0–0.7)
Eosinophils Relative: 1 %
HEMATOCRIT: 43.8 % (ref 35.0–47.0)
HEMOGLOBIN: 14 g/dL (ref 12.0–16.0)
LYMPHS ABS: 3.1 10*3/uL (ref 1.0–3.6)
LYMPHS PCT: 27 %
MCH: 25.2 pg — ABNORMAL LOW (ref 26.0–34.0)
MCHC: 32 g/dL (ref 32.0–36.0)
MCV: 78.9 fL — ABNORMAL LOW (ref 80.0–100.0)
Monocytes Absolute: 0.7 10*3/uL (ref 0.2–0.9)
Monocytes Relative: 6 %
Neutro Abs: 7.3 10*3/uL — ABNORMAL HIGH (ref 1.4–6.5)
Neutrophils Relative %: 65 %
Platelets: 229 10*3/uL (ref 150–440)
RBC: 5.55 MIL/uL — AB (ref 3.80–5.20)
RDW: 15.6 % — ABNORMAL HIGH (ref 11.5–14.5)
WBC: 11.2 10*3/uL — ABNORMAL HIGH (ref 3.6–11.0)

## 2016-11-14 LAB — COMPREHENSIVE METABOLIC PANEL
ALK PHOS: 79 U/L (ref 38–126)
ALT: 19 U/L (ref 14–54)
AST: 21 U/L (ref 15–41)
Albumin: 3.2 g/dL — ABNORMAL LOW (ref 3.5–5.0)
Anion gap: 7 (ref 5–15)
BUN: 9 mg/dL (ref 6–20)
CALCIUM: 8.7 mg/dL — AB (ref 8.9–10.3)
CHLORIDE: 96 mmol/L — AB (ref 101–111)
CO2: 28 mmol/L (ref 22–32)
CREATININE: 0.71 mg/dL (ref 0.44–1.00)
Glucose, Bld: 286 mg/dL — ABNORMAL HIGH (ref 65–99)
Potassium: 4.3 mmol/L (ref 3.5–5.1)
Sodium: 131 mmol/L — ABNORMAL LOW (ref 135–145)
Total Bilirubin: 0.7 mg/dL (ref 0.3–1.2)
Total Protein: 6.9 g/dL (ref 6.5–8.1)

## 2016-11-14 MED ORDER — NAPROXEN 500 MG PO TABS: 500.0000 mg | ORAL_TABLET | Freq: Two times a day (BID) | ORAL | 0 refills | Status: AC

## 2016-11-14 MED ORDER — CYCLOBENZAPRINE HCL 10 MG PO TABS: 10.0000 mg | ORAL_TABLET | Freq: Three times a day (TID) | ORAL | 0 refills | Status: DC | PRN

## 2016-11-14 MED ORDER — IOPAMIDOL (ISOVUE-300) INJECTION 61%
100.0000 mL | Freq: Once | INTRAVENOUS | Status: AC | PRN
  Administered 2016-11-14: 100 mL via INTRAVENOUS
  Filled 2016-11-14: qty 100

## 2016-11-14 MED ORDER — FENTANYL CITRATE (PF) 100 MCG/2ML IJ SOLN
100.0000 ug | INTRAMUSCULAR | Status: DC | PRN
  Administered 2016-11-14: 100 ug via INTRAVENOUS
  Filled 2016-11-14: qty 2
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
## 2016-11-14 NOTE — ED Notes (Signed)
Pt presentation discussed with Dr. York CeriseForbach; no new orders at this time.

## 2016-11-16 ENCOUNTER — Encounter (HOSPITAL_COMMUNITY): Payer: Self-pay | Admitting: Emergency Medicine

## 2016-11-16 ENCOUNTER — Emergency Department (HOSPITAL_COMMUNITY)
Admission: EM | Admit: 2016-11-16 | Discharge: 2016-11-16 | Disposition: A | Discharge status: Home / Self Care | Payer: Medicaid Other | Attending: Emergency Medicine | Admitting: Emergency Medicine

## 2016-11-16 ENCOUNTER — Other Ambulatory Visit: Payer: Self-pay

## 2016-11-16 ENCOUNTER — Emergency Department (HOSPITAL_COMMUNITY): Payer: Medicaid Other

## 2016-11-16 DIAGNOSIS — J45909 Unspecified asthma, uncomplicated: Secondary | ICD-10-CM | POA: Diagnosis not present

## 2016-11-16 DIAGNOSIS — S92154A Nondisplaced avulsion fracture (chip fracture) of right talus, initial encounter for closed fracture: Secondary | ICD-10-CM | POA: Diagnosis not present

## 2016-11-16 DIAGNOSIS — Y939 Activity, unspecified: Secondary | ICD-10-CM | POA: Diagnosis not present

## 2016-11-16 DIAGNOSIS — Y998 Other external cause status: Secondary | ICD-10-CM | POA: Diagnosis not present

## 2016-11-16 DIAGNOSIS — Z794 Long term (current) use of insulin: Secondary | ICD-10-CM | POA: Diagnosis not present

## 2016-11-16 DIAGNOSIS — E119 Type 2 diabetes mellitus without complications: Secondary | ICD-10-CM | POA: Insufficient documentation

## 2016-11-16 DIAGNOSIS — I10 Essential (primary) hypertension: Secondary | ICD-10-CM | POA: Diagnosis not present

## 2016-11-16 DIAGNOSIS — S99921A Unspecified injury of right foot, initial encounter: Secondary | ICD-10-CM | POA: Diagnosis present

## 2016-11-16 DIAGNOSIS — Z79899 Other long term (current) drug therapy: Secondary | ICD-10-CM | POA: Diagnosis not present

## 2016-11-16 DIAGNOSIS — F17210 Nicotine dependence, cigarettes, uncomplicated: Secondary | ICD-10-CM | POA: Insufficient documentation

## 2016-11-16 DIAGNOSIS — Y92410 Unspecified street and highway as the place of occurrence of the external cause: Secondary | ICD-10-CM | POA: Diagnosis not present

## 2016-11-16 MED ORDER — IBUPROFEN 800 MG PO TABS
800.0000 mg | ORAL_TABLET | Freq: Once | ORAL | Status: AC
  Administered 2016-11-16: 800 mg via ORAL
  Filled 2016-11-16: qty 1

## 2016-11-16 MED ORDER — OXYCODONE HCL 15 MG PO TABS: 15.0000 mg | ORAL_TABLET | Freq: Three times a day (TID) | ORAL | 0 refills | Status: DC | PRN

## 2016-11-16 MED ORDER — OXYCODONE-ACETAMINOPHEN 5-325 MG PO TABS
1.0000 | ORAL_TABLET | Freq: Once | ORAL | Status: AC
  Administered 2016-11-16: 1 via ORAL
  Filled 2016-11-16: qty 1

## 2016-11-16 MED ORDER — CYCLOBENZAPRINE HCL 10 MG PO TABS: 10.0000 mg | ORAL_TABLET | Freq: Three times a day (TID) | ORAL | 0 refills | Status: DC | PRN

## 2016-11-16 NOTE — ED Notes (Signed)
While monitor was taking pt BP pt was holding her left arm straight and stiff out to her side. Pt is encouraged to relax her arm but she states it is hurting.
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
## 2016-11-16 NOTE — ED Notes (Signed)
Pt is crying, requesting pain meds, will inform provider

## 2016-11-16 NOTE — ED Notes (Signed)
Pt verbalized understanding of discharge instructions. Pt in NAD.

## 2016-11-16 NOTE — Discharge Instructions (Signed)
You have a small break in your right foot.  Please wear cam walker boot and use crutches for support.  Followup with orthopedist in 1 week for further care.

## 2016-11-16 NOTE — Progress Notes (Signed)
Orthopedic Tech Progress Note Patient Details:  Caitlyn Fuller Caitlyn Fuller 04-07-1975 [id number]  Ortho Devices Type of Ortho Device: CAM walker, Crutches Ortho Device/Splint Interventions: Application   Saul FordyceJennifer C Irvan Tiedt 11/16/2016, 1:00 PM
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
## 2016-11-16 NOTE — ED Notes (Signed)
Ortho tech at bedside placing cam walker and teaching use of crutches

## 2016-11-20 ENCOUNTER — Encounter (HOSPITAL_COMMUNITY): Payer: Self-pay | Admitting: Emergency Medicine

## 2016-11-20 ENCOUNTER — Emergency Department (HOSPITAL_COMMUNITY)
Admission: EM | Admit: 2016-11-20 | Discharge: 2016-11-20 | Disposition: A | Discharge status: Home / Self Care | Payer: Medicaid Other | Attending: Emergency Medicine | Admitting: Emergency Medicine

## 2016-11-20 DIAGNOSIS — S92154D Nondisplaced avulsion fracture (chip fracture) of right talus, subsequent encounter for fracture with routine healing: Secondary | ICD-10-CM | POA: Diagnosis not present

## 2016-11-20 DIAGNOSIS — Z794 Long term (current) use of insulin: Secondary | ICD-10-CM | POA: Diagnosis not present

## 2016-11-20 DIAGNOSIS — M25571 Pain in right ankle and joints of right foot: Secondary | ICD-10-CM

## 2016-11-20 DIAGNOSIS — Z79899 Other long term (current) drug therapy: Secondary | ICD-10-CM | POA: Diagnosis not present

## 2016-11-20 DIAGNOSIS — S99911D Unspecified injury of right ankle, subsequent encounter: Secondary | ICD-10-CM | POA: Diagnosis present

## 2016-11-20 DIAGNOSIS — E119 Type 2 diabetes mellitus without complications: Secondary | ICD-10-CM | POA: Insufficient documentation

## 2016-11-20 DIAGNOSIS — F17210 Nicotine dependence, cigarettes, uncomplicated: Secondary | ICD-10-CM | POA: Insufficient documentation

## 2016-11-20 DIAGNOSIS — I10 Essential (primary) hypertension: Secondary | ICD-10-CM | POA: Diagnosis not present

## 2016-11-20 DIAGNOSIS — J45909 Unspecified asthma, uncomplicated: Secondary | ICD-10-CM | POA: Diagnosis not present

## 2016-11-20 MED ORDER — OXYCODONE HCL 5 MG PO TABS
10.0000 mg | ORAL_TABLET | Freq: Once | ORAL | Status: AC
  Administered 2016-11-20: 10 mg via ORAL
  Filled 2016-11-20: qty 2

## 2016-11-20 MED ORDER — LORAZEPAM 1 MG PO TABS
1.0000 mg | ORAL_TABLET | Freq: Once | ORAL | Status: AC
  Administered 2016-11-20: 1 mg via ORAL
  Filled 2016-11-20: qty 1

## 2016-11-20 NOTE — Discharge Instructions (Signed)
1. Medications: alternate ibuprofen and tylenol for pain control, usual home medications 2. Treatment: rest, ice, elevate and use brace, drink plenty of fluids, gentle stretching 3. Follow Up: Please followup with orthopedics as directed for discussion of your diagnoses and further evaluation after today's visit;
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
## 2016-11-20 NOTE — ED Notes (Signed)
Patient left without waiting for her blood pressure to go down . Pt. stated it will not go down because of my nerves and pain .
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
## 2016-12-11 ENCOUNTER — Encounter: Payer: Self-pay | Admitting: Emergency Medicine

## 2016-12-11 ENCOUNTER — Emergency Department
Admission: EM | Admit: 2016-12-11 | Discharge: 2016-12-11 | Disposition: A | Discharge status: Home / Self Care | Payer: Medicaid Other | Attending: Emergency Medicine | Admitting: Emergency Medicine

## 2016-12-11 DIAGNOSIS — Z791 Long term (current) use of non-steroidal anti-inflammatories (NSAID): Secondary | ICD-10-CM | POA: Insufficient documentation

## 2016-12-11 DIAGNOSIS — Z23 Encounter for immunization: Secondary | ICD-10-CM | POA: Insufficient documentation

## 2016-12-11 DIAGNOSIS — S59912A Unspecified injury of left forearm, initial encounter: Secondary | ICD-10-CM | POA: Diagnosis present

## 2016-12-11 DIAGNOSIS — Z79899 Other long term (current) drug therapy: Secondary | ICD-10-CM | POA: Insufficient documentation

## 2016-12-11 DIAGNOSIS — Y999 Unspecified external cause status: Secondary | ICD-10-CM | POA: Insufficient documentation

## 2016-12-11 DIAGNOSIS — F17210 Nicotine dependence, cigarettes, uncomplicated: Secondary | ICD-10-CM | POA: Diagnosis not present

## 2016-12-11 DIAGNOSIS — J45909 Unspecified asthma, uncomplicated: Secondary | ICD-10-CM | POA: Diagnosis not present

## 2016-12-11 DIAGNOSIS — G8929 Other chronic pain: Secondary | ICD-10-CM | POA: Diagnosis not present

## 2016-12-11 DIAGNOSIS — E119 Type 2 diabetes mellitus without complications: Secondary | ICD-10-CM | POA: Diagnosis not present

## 2016-12-11 DIAGNOSIS — I10 Essential (primary) hypertension: Secondary | ICD-10-CM | POA: Diagnosis not present

## 2016-12-11 DIAGNOSIS — Y939 Activity, unspecified: Secondary | ICD-10-CM | POA: Diagnosis not present

## 2016-12-11 DIAGNOSIS — Y92009 Unspecified place in unspecified non-institutional (private) residence as the place of occurrence of the external cause: Secondary | ICD-10-CM | POA: Diagnosis not present

## 2016-12-11 DIAGNOSIS — S51812A Laceration without foreign body of left forearm, initial encounter: Secondary | ICD-10-CM

## 2016-12-11 MED ORDER — HYDROCODONE-ACETAMINOPHEN 5-325 MG PO TABS
1.0000 | ORAL_TABLET | Freq: Once | ORAL | Status: AC
  Administered 2016-12-11: 1 via ORAL
  Filled 2016-12-11: qty 1

## 2016-12-11 MED ORDER — TETANUS-DIPHTH-ACELL PERTUSSIS 5-2.5-18.5 LF-MCG/0.5 IM SUSP
0.5000 mL | Freq: Once | INTRAMUSCULAR | Status: AC
  Administered 2016-12-11: 0.5 mL via INTRAMUSCULAR
  Filled 2016-12-11: qty 0.5

## 2016-12-11 MED ORDER — LORAZEPAM 1 MG PO TABS
1.0000 mg | ORAL_TABLET | Freq: Once | ORAL | Status: AC
  Administered 2016-12-11: 1 mg via ORAL

## 2016-12-11 MED ORDER — LIDOCAINE-EPINEPHRINE 2 %-1:100000 IJ SOLN
10.0000 mL | Freq: Once | INTRAMUSCULAR | Status: DC
  Filled 2016-12-11: qty 10

## 2016-12-11 MED ORDER — LORAZEPAM 1 MG PO TABS
ORAL_TABLET | ORAL | Status: AC
  Filled 2016-12-11: qty 1

## 2016-12-11 NOTE — Discharge Instructions (Signed)
Please do not get your wounds wet for at least 24 hours.  Follow-up with your primary care physician in 2 days for recheck.  Return to the emergency department sooner for any new or worsening symptoms such as worsening pain, fevers, chills, bleeding, foul smell, or for any other issues whatsoever.  It was a pleasure to take care of you today, and thank you for coming to our emergency department.  If you have any questions or concerns before leaving please ask the nurse to grab me and I'm more than happy to go through your aftercare instructions again.  If you were prescribed any opioid pain medication today such as Norco, Vicodin, Percocet, morphine, hydrocodone, or oxycodone please make sure you do not drive when you are taking this medication as it can alter your ability to drive safely.  If you have any concerns once you are home that you are not improving or are in fact getting worse before you can make it to your follow-up appointment, please do not hesitate to call 911 and come back for further evaluation.  Merrily BrittleNeil Yoshio Seliga, MD
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
## 2016-12-11 NOTE — ED Notes (Signed)
[employer] at bedside 

## 2016-12-13 ENCOUNTER — Emergency Department: Payer: No Typology Code available for payment source

## 2016-12-13 ENCOUNTER — Emergency Department
Admission: EM | Admit: 2016-12-13 | Discharge: 2016-12-13 | Disposition: A | Discharge status: Home / Self Care | Payer: No Typology Code available for payment source | Attending: Emergency Medicine | Admitting: Emergency Medicine

## 2016-12-13 ENCOUNTER — Encounter: Payer: Self-pay | Admitting: Emergency Medicine

## 2016-12-13 DIAGNOSIS — F17210 Nicotine dependence, cigarettes, uncomplicated: Secondary | ICD-10-CM | POA: Insufficient documentation

## 2016-12-13 DIAGNOSIS — S51812D Laceration without foreign body of left forearm, subsequent encounter: Secondary | ICD-10-CM

## 2016-12-13 DIAGNOSIS — Z9049 Acquired absence of other specified parts of digestive tract: Secondary | ICD-10-CM | POA: Insufficient documentation

## 2016-12-13 DIAGNOSIS — Z79899 Other long term (current) drug therapy: Secondary | ICD-10-CM | POA: Insufficient documentation

## 2016-12-13 DIAGNOSIS — I10 Essential (primary) hypertension: Secondary | ICD-10-CM | POA: Insufficient documentation

## 2016-12-13 DIAGNOSIS — S59912A Unspecified injury of left forearm, initial encounter: Secondary | ICD-10-CM | POA: Diagnosis present

## 2016-12-13 DIAGNOSIS — J45909 Unspecified asthma, uncomplicated: Secondary | ICD-10-CM | POA: Diagnosis not present

## 2016-12-13 DIAGNOSIS — Y939 Activity, unspecified: Secondary | ICD-10-CM | POA: Insufficient documentation

## 2016-12-13 DIAGNOSIS — Y929 Unspecified place or not applicable: Secondary | ICD-10-CM | POA: Diagnosis not present

## 2016-12-13 DIAGNOSIS — Z794 Long term (current) use of insulin: Secondary | ICD-10-CM | POA: Diagnosis not present

## 2016-12-13 DIAGNOSIS — Y828 Other medical devices associated with adverse incidents: Secondary | ICD-10-CM | POA: Diagnosis not present

## 2016-12-13 DIAGNOSIS — S51812A Laceration without foreign body of left forearm, initial encounter: Secondary | ICD-10-CM | POA: Diagnosis not present

## 2016-12-13 DIAGNOSIS — T8130XA Disruption of wound, unspecified, initial encounter: Secondary | ICD-10-CM

## 2016-12-13 DIAGNOSIS — E119 Type 2 diabetes mellitus without complications: Secondary | ICD-10-CM | POA: Insufficient documentation

## 2016-12-13 DIAGNOSIS — Y999 Unspecified external cause status: Secondary | ICD-10-CM | POA: Insufficient documentation

## 2016-12-13 DIAGNOSIS — T8131XA Disruption of external operation (surgical) wound, not elsewhere classified, initial encounter: Secondary | ICD-10-CM | POA: Insufficient documentation

## 2016-12-13 MED ORDER — HYDROMORPHONE HCL 1 MG/ML IJ SOLN
1.0000 mg | Freq: Once | INTRAMUSCULAR | Status: AC
  Administered 2016-12-13: 1 mg via INTRAMUSCULAR
  Filled 2016-12-13: qty 1
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
## 2016-12-13 NOTE — ED Notes (Signed)
Pt stating she has got to go to get her daughter on the bus. Pt given sling for arm and discharge paperwork. Pt verbalized understanding and has no questions at this time.

## 2016-12-13 NOTE — Discharge Instructions (Signed)
Please follow up with your primary care physician for further evaluation of your arm pain and lacerations

## 2016-12-13 NOTE — ED Triage Notes (Signed)
Pt seen on Monday for laceration to the left forearm, dermabond in place with steri strips and gauze wrapping. Pt back to ED this AM due to "pulling" and pain. Pts gauze dressing is stuck to the dermabond. Pts dressing removed with some of old dressing stuck to wounds. Dry blood present on assessment.

## 2016-12-13 NOTE — ED Notes (Signed)
Pt given orange juice and towel for her arm

## 2016-12-13 NOTE — ED Notes (Signed)
Pt to triage accomp by daughter; pt is crying, upset that "she isn't receiving the care she needs"; st that she "has been asking for gauze for her bleeding wound and no one has told her anything"; steristrips in place to left FA wound with no bleeding/redness/drainage/swelling noted; small area noted with bits of gauze dressing stuck to dermabond; spoke with Dr Zenda AlpersWebster regarding pt's concerns; instructed pt per MD that no further wound care was advised at this time since the wound has already been closed with the dermabond and steristrips; instructed that these would fall off as the wound healed; instructed pt on S&S of infection to return for; pt & daughter returned to exam room and voices understanding of plan of care

## 2017-01-10 ENCOUNTER — Other Ambulatory Visit: Payer: Self-pay | Admitting: Sports Medicine

## 2017-01-10 DIAGNOSIS — M25571 Pain in right ankle and joints of right foot: Secondary | ICD-10-CM

## 2017-01-20 ENCOUNTER — Inpatient Hospital Stay
Admission: RE | Admit: 2017-01-20 | Discharge: 2017-01-20 | Disposition: A | Discharge status: Home / Self Care | Payer: Medicaid Other | Source: Ambulatory Visit | Attending: Sports Medicine | Admitting: Sports Medicine

## 2017-01-22 ENCOUNTER — Emergency Department
Admission: EM | Admit: 2017-01-22 | Discharge: 2017-01-22 | Discharge status: Absent without leave | Payer: Medicaid Other

## 2017-01-22 NOTE — ED Notes (Signed)
No answer when called for triage 

## 2017-01-22 NOTE — ED Notes (Signed)
No when when called for triage

## 2017-06-24 ENCOUNTER — Emergency Department
Admission: EM | Admit: 2017-06-24 | Discharge: 2017-06-24 | Disposition: A | Discharge status: Home / Self Care | Payer: Medicaid Other | Attending: Emergency Medicine | Admitting: Emergency Medicine

## 2017-06-24 ENCOUNTER — Other Ambulatory Visit: Payer: Self-pay

## 2017-06-24 ENCOUNTER — Emergency Department: Payer: Medicaid Other

## 2017-06-24 DIAGNOSIS — I10 Essential (primary) hypertension: Secondary | ICD-10-CM | POA: Diagnosis not present

## 2017-06-24 DIAGNOSIS — Z79899 Other long term (current) drug therapy: Secondary | ICD-10-CM | POA: Diagnosis not present

## 2017-06-24 DIAGNOSIS — F17210 Nicotine dependence, cigarettes, uncomplicated: Secondary | ICD-10-CM | POA: Diagnosis not present

## 2017-06-24 DIAGNOSIS — R0602 Shortness of breath: Secondary | ICD-10-CM | POA: Diagnosis present

## 2017-06-24 DIAGNOSIS — J40 Bronchitis, not specified as acute or chronic: Secondary | ICD-10-CM | POA: Diagnosis not present

## 2017-06-24 DIAGNOSIS — E1165 Type 2 diabetes mellitus with hyperglycemia: Secondary | ICD-10-CM | POA: Diagnosis not present

## 2017-06-24 LAB — TROPONIN I

## 2017-06-24 LAB — BASIC METABOLIC PANEL
ANION GAP: 8 (ref 5–15)
BUN: 16 mg/dL (ref 6–20)
CALCIUM: 8.5 mg/dL — AB (ref 8.9–10.3)
CO2: 28 mmol/L (ref 22–32)
Chloride: 98 mmol/L — ABNORMAL LOW (ref 101–111)
Creatinine, Ser: 0.74 mg/dL (ref 0.44–1.00)
GFR calc Af Amer: >60 mL/min (ref >60)
GLUCOSE: 243 mg/dL — AB (ref 65–99)
Potassium: 4.2 mmol/L (ref 3.5–5.1)
SODIUM: 134 mmol/L — AB (ref 135–145)

## 2017-06-24 LAB — CBC
HCT: 39 % (ref 35.0–47.0)
Hemoglobin: 13 g/dL (ref 12.0–16.0)
MCH: 26.1 pg (ref 26.0–34.0)
MCHC: 33.4 g/dL (ref 32.0–36.0)
MCV: 78.2 fL — ABNORMAL LOW (ref 80.0–100.0)
Platelets: 228 10*3/uL (ref 150–440)
RBC: 4.99 MIL/uL (ref 3.80–5.20)
RDW: 15.6 % — AB (ref 11.5–14.5)
WBC: 11.4 10*3/uL — AB (ref 3.6–11.0)

## 2017-06-24 MED ORDER — IPRATROPIUM-ALBUTEROL 0.5-2.5 (3) MG/3ML IN SOLN
3.0000 mL | Freq: Once | RESPIRATORY_TRACT | Status: AC
  Administered 2017-06-24: 3 mL via RESPIRATORY_TRACT
  Filled 2017-06-24: qty 3

## 2017-06-24 MED ORDER — DEXAMETHASONE SODIUM PHOSPHATE 10 MG/ML IJ SOLN
INTRAMUSCULAR | Status: AC
  Filled 2017-06-24: qty 1

## 2017-06-24 MED ORDER — DEXAMETHASONE 1 MG/ML PO CONC
10.0000 mg | Freq: Once | ORAL | Status: AC
  Administered 2017-06-24: 10 mg via ORAL

## 2017-06-24 MED ORDER — LORAZEPAM 0.5 MG PO TABS
0.5000 mg | ORAL_TABLET | Freq: Once | ORAL | Status: DC
  Filled 2017-06-24: qty 1

## 2017-06-24 NOTE — ED Provider Notes (Signed)
[facility] Emergency Department Provider Note   ____________________________________________   First MD Initiated Contact with Patient 06/24/17 0149     (approximate)  I have reviewed the triage vital signs and the nursing notes.   HISTORY  Chief Complaint Shortness of Breath    HPI Caitlyn Fuller is a 42 y.o. female who comes into the hospital today with shortness of breath.  She states that she feels like there is swelling under her neck her hands and her stomach.  She feels like something bad is going to happen and feels like she is going to die.  She is unsure if she is having a panic attack or if something else is going on.  The patient has been having some blurred vision and feels like she cannot catch her breath and it scares her.  The patient is very tearful during the history.  She reports that she is been having symptoms for a week.  It got worse yesterday and today.  The patient states that she stopped smoking about a week ago but it seems to be making her symptoms worse.  The patient was concerned so she decided to come into the hospital today for evaluation.  Past Medical History:  Diagnosis Date  . Asthma   . Diabetes mellitus without complication (HCC)   . Hernia   . Hypertension   . Kidney infection   . Shingles     Patient Active Problem List   Diagnosis Date Noted  . Hyperglycemia without ketosis   . Chronic pain   . Herpes zoster   . Renal mass, left   . Essential hypertension   . Hyperglycemia 11/01/2014    Past Surgical History:  Procedure Laterality Date  . ABDOMINAL SURGERY    . CESAREAN SECTION    . CHOLECYSTECTOMY    . colonscopy    . TUBAL LIGATION      Prior to Admission medications   Medication Sig Start Date End Date Taking? Authorizing Provider  albuterol (PROVENTIL HFA;VENTOLIN HFA) 108 (90 Base) MCG/ACT inhaler Inhale 2 puffs into the lungs every 6 (six) hours as needed for wheezing or shortness of  breath. 01/12/16   Darci CurrentBrown, Onycha N, MD  albuterol (PROVENTIL) (2.5 MG/3ML) 0.083% nebulizer solution Take 3 mLs (2.5 mg total) by nebulization every 4 (four) hours as needed for wheezing or shortness of breath. 01/12/16   Darci CurrentBrown, Towanda N, MD  cyclobenzaprine (FLEXERIL) 10 MG tablet Take 1 tablet (10 mg total) 3 (three) times daily as needed by mouth for muscle spasms. 11/16/16   Fayrene Helperran, Bowie, PA-C  dicyclomine (BENTYL) 20 MG tablet Take 1 tablet (20 mg total) by mouth every 6 (six) hours as needed. 09/21/16   Irean HongSung, Jade J, MD  etodolac (LODINE) 200 MG capsule Take 1 capsule (200 mg total) by mouth every 8 (eight) hours. Patient not taking: Reported on 04/07/2016 03/15/16   Rebecka ApleyWebster, Naly Schwanz P, MD  furosemide (LASIX) 20 MG tablet Take 1 tablet (20 mg total) by mouth daily. 03/06/16 03/06/17  Minna AntisPaduchowski, Kevin, MD  HYDROcodone-acetaminophen (NORCO/VICODIN) 5-325 MG tablet Take 1 tablet by mouth every 6 (six) hours as needed for moderate pain. 04/07/16   Lawyer, Cristal Deerhristopher, PA-C  insulin NPH-regular Human (NOVOLIN 70/30) (70-30) 100 UNIT/ML injection Inject 52 units under the skin in the morning and 48 units under the skin at night Patient taking differently: Inject 38-48 Units into the skin 2 (two) times daily with a meal. Inject 48 units under the skin in  the morning and 38 units under the skin at night 06/13/15   Rebecka Apley, MD  lisinopril (PRINIVIL,ZESTRIL) 10 MG tablet Take 10 mg by mouth daily.    [provider]  naproxen (NAPROSYN) 500 MG tablet Take 1 tablet (500 mg total) by mouth 2 (two) times daily with a meal. Patient not taking: Reported on 04/07/2016 04/26/15   Jene Every, MD  naproxen (NAPROSYN) 500 MG tablet Take 1 tablet (500 mg total) 2 (two) times daily with a meal by mouth. 11/14/16 11/14/17  Willy Eddy, MD  ondansetron (ZOFRAN ODT) 4 MG disintegrating tablet Take 1 tablet (4 mg total) by mouth every 8 (eight) hours as needed for nausea or vomiting. 09/21/16   Irean Hong, MD  oxyCODONE (ROXICODONE) 15 MG immediate release tablet Take 1 tablet (15 mg total) every 8 (eight) hours as needed by mouth for pain. 11/16/16   Fayrene Helper, PA-C    Allergies Gabapentin; Morphine and related; Darvocet [propoxyphene n-acetaminophen]; Toradol [ketorolac tromethamine]; Ultram [tramadol]; and Vancomycin  No family history on file.  Social History Social History   Tobacco Use  . Smoking status: Current Every Day Smoker    Packs/day: 0.50    Types: Cigarettes  . Smokeless tobacco: Never Used  Substance Use Topics  . Alcohol use: No  . Drug use: No    Review of Systems  Constitutional: No fever/chills Eyes: No visual changes. ENT: No sore throat. Cardiovascular: Denies chest pain. Respiratory:  shortness of breath. Gastrointestinal: No abdominal pain.  No nausea, no vomiting.  No diarrhea.  No constipation. Genitourinary: Negative for dysuria. Musculoskeletal: Negative for back pain. Skin: Negative for rash. Neurological: Negative for headaches, focal weakness or numbness.   ____________________________________________   PHYSICAL EXAM:  VITAL SIGNS: ED Triage Vitals  Enc Vitals Group     BP 06/24/17 0036 (!) 188/91     Pulse Rate 06/24/17 0036 81     Resp 06/24/17 0036 16     Temp 06/24/17 0036 98.4 F (36.9 C)     Temp Source 06/24/17 0036 Oral     SpO2 06/24/17 0036 98 %     Weight 06/24/17 0033 262 lb (118.8 kg)     Height 06/24/17 0033 5' (1.524 m)     Head Circumference --      Peak Flow --      Pain Score --      Pain Loc --      Pain Edu? --      Excl. in GC? --     Constitutional: Alert and oriented. Well appearing and in mild distress. Eyes: Conjunctivae are normal. PERRL. EOMI. Head: Atraumatic. Nose: No congestion/rhinnorhea. Mouth/Throat: Mucous membranes are moist.  Oropharynx non-erythematous. Cardiovascular: Normal rate, regular rhythm. Grossly normal heart sounds.  Good peripheral circulation. Respiratory: Normal  respiratory effort.  No retractions. Lungs CTAB. Gastrointestinal: Soft and nontender. No distention.  Positive bowel sounds Musculoskeletal: No lower extremity tenderness nor edema.   Neurologic:  Normal speech and language.  Skin:  Skin is warm, dry and intact. Marland Kitchen Psychiatric: Mood and affect are normal.   ____________________________________________   LABS (all labs ordered are listed, but only abnormal results are displayed)  Labs Reviewed  BASIC METABOLIC PANEL - Abnormal; Notable for the following components:      Result Value   Sodium 134 (*)    Chloride 98 (*)    Glucose, Bld 243 (*)    Calcium 8.5 (*)    All other components within  normal limits  CBC - Abnormal; Notable for the following components:   WBC 11.4 (*)    MCV 78.2 (*)    RDW 15.6 (*)    All other components within normal limits  TROPONIN I   ____________________________________________  EKG  ED ECG REPORT I, Rebecka Apley, the attending physician, personally viewed and interpreted this ECG.   Date: 06/24/2017  EKG Time: 0034  Rate: 78  Rhythm: normal sinus rhythm  Axis: normal  Intervals:none  ST&T Change: none  ____________________________________________  RADIOLOGY  ED MD interpretation:  CXR: Moderate bronchial thickening suggesting bronchitis or asthma  Official radiology report(s): Dg Chest 2 View  Result Date: 06/24/2017 CLINICAL DATA:  Acute onset of shortness of breath. EXAM: CHEST - 2 VIEW COMPARISON:  Radiographs and CT 11/14/2016 FINDINGS: The cardiomediastinal contours are normal. Moderate bronchial thickening. Pulmonary vasculature is normal. No consolidation, pleural effusion, or pneumothorax. No acute osseous abnormalities are seen. IMPRESSION: Moderate bronchial thickening suggesting bronchitis or asthma. Electronically Signed   By: Rubye Oaks M.D.   On: 06/24/2017 01:00    ____________________________________________   PROCEDURES  Procedure(s) performed:  None  Procedures  Critical Care performed: No  ____________________________________________   INITIAL IMPRESSION / ASSESSMENT AND PLAN / ED COURSE  As part of my medical decision making, I reviewed the following data within the electronic MEDICAL RECORD NUMBER Notes from prior ED visits and [place] Controlled Substance Database   This is a 42 year old female who comes into the hospital today with some shortness of breath.  The patient is tearful and crying.  My differential diagnosis includes bronchitis, COPD, pneumonia, anxiety.  We did check some blood work on the patient to include a CBC BMP and a troponin.  The patient's blood work returned unremarkable aside from the patient's blood glucose of 243.  The patient had a chest x-ray which showed some mild bronchial thickening suggesting bronchitis or asthma.  Although the patient does not have any wheezing I did give her a DuoNeb treatment and some dexamethasone.  I also ordered some Ativan for the patient but the patient refused stating that she heard someone was strong on Ativan and she would prefer a Xanax.  I did tell the nurse that I would prefer to give Ativan and the patient is welcome to refuse if she does not want the medicines.  After the patient received her medications she asked if she would be discharged home.  We did explain that given her shortness of breath and her chest tightness we would want to repeat a troponin but the patient did not want to stay to have any further blood work drawn.  She reports that after the breathing treatment she feels much improved and she just wants to go home.  We did have the patient sign out AGAINST MEDICAL ADVICE as we did not complete her evaluation.  The patient may return with any worsening symptoms but should follow-up with her primary care physician.      ____________________________________________   FINAL CLINICAL IMPRESSION(S) / ED DIAGNOSES  Final diagnoses:  Shortness of breath   Bronchitis     ED Discharge Orders    None       Note:  This document was prepared using Dragon voice recognition software and may include unintentional dictation errors.    Rebecka Apley, MD 06/24/17 [phone]

## 2017-06-24 NOTE — ED Notes (Signed)
MD at bedside. Warm blanket provided.

## 2017-06-24 NOTE — ED Notes (Signed)
Pt reports increased anxiety for the past two days when thinking about if her SOB and increased WOB worsened to the point of her dying. Pt reports she has been thinking about how it would feel if she died and is tearful talking to RN about this.   Pt also reports neck and abd swelling that has worsened throughout the past two days without cough or congestion. No changes in voice. No fevers.

## 2017-06-24 NOTE — ED Notes (Signed)
Pt reports she is feeling much better after medication and breathing treatment. Pt ambulatory and reports she would like to leave AMA. PT very pleasant and in right mind. MD made aware.

## 2017-06-24 NOTE — ED Notes (Signed)
Pt reports she saw a video of someone high on Ativan and does not want to get that bad. Pt refused medication. MD aware.

## 2017-06-24 NOTE — ED Triage Notes (Signed)
Patient reports over the past few days, skin is blotchy, that neck and abdomen swelling and that it is difficulty to breath.  Patient reports recently quit smoking (cold [place]).  Patient tearful in triage.

## 2017-07-25 ENCOUNTER — Emergency Department
Admission: EM | Admit: 2017-07-25 | Discharge: 2017-07-25 | Disposition: A | Discharge status: Home / Self Care | Payer: Medicaid Other | Attending: Emergency Medicine | Admitting: Emergency Medicine

## 2017-07-25 ENCOUNTER — Emergency Department: Payer: Medicaid Other

## 2017-07-25 ENCOUNTER — Other Ambulatory Visit: Payer: Self-pay

## 2017-07-25 DIAGNOSIS — J45909 Unspecified asthma, uncomplicated: Secondary | ICD-10-CM | POA: Insufficient documentation

## 2017-07-25 DIAGNOSIS — E119 Type 2 diabetes mellitus without complications: Secondary | ICD-10-CM | POA: Insufficient documentation

## 2017-07-25 DIAGNOSIS — F17210 Nicotine dependence, cigarettes, uncomplicated: Secondary | ICD-10-CM | POA: Insufficient documentation

## 2017-07-25 DIAGNOSIS — Z79899 Other long term (current) drug therapy: Secondary | ICD-10-CM | POA: Insufficient documentation

## 2017-07-25 DIAGNOSIS — I10 Essential (primary) hypertension: Secondary | ICD-10-CM | POA: Insufficient documentation

## 2017-07-25 DIAGNOSIS — Z794 Long term (current) use of insulin: Secondary | ICD-10-CM | POA: Insufficient documentation

## 2017-07-25 DIAGNOSIS — R079 Chest pain, unspecified: Secondary | ICD-10-CM | POA: Diagnosis present

## 2017-07-25 LAB — BASIC METABOLIC PANEL
Anion gap: 7 (ref 5–15)
BUN: 12 mg/dL (ref 6–20)
CHLORIDE: 97 mmol/L — AB (ref 98–111)
CO2: 29 mmol/L (ref 22–32)
Calcium: 8.3 mg/dL — ABNORMAL LOW (ref 8.9–10.3)
Creatinine, Ser: 0.66 mg/dL (ref 0.44–1.00)
GFR calc Af Amer: >60 mL/min (ref >60)
GLUCOSE: 365 mg/dL — AB (ref 70–99)
POTASSIUM: 4.1 mmol/L (ref 3.5–5.1)
Sodium: 133 mmol/L — ABNORMAL LOW (ref 135–145)

## 2017-07-25 LAB — CBC
HEMATOCRIT: 40.5 % (ref 35.0–47.0)
HEMOGLOBIN: 13.2 g/dL (ref 12.0–16.0)
MCH: 25.5 pg — AB (ref 26.0–34.0)
MCHC: 32.5 g/dL (ref 32.0–36.0)
MCV: 78.5 fL — ABNORMAL LOW (ref 80.0–100.0)
Platelets: 226 10*3/uL (ref 150–440)
RBC: 5.16 MIL/uL (ref 3.80–5.20)
RDW: 16.1 % — ABNORMAL HIGH (ref 11.5–14.5)
WBC: 11.6 10*3/uL — ABNORMAL HIGH (ref 3.6–11.0)

## 2017-07-25 LAB — URINALYSIS, COMPLETE (UACMP) WITH MICROSCOPIC
BILIRUBIN URINE: NEGATIVE
Bacteria, UA: NONE SEEN
Hgb urine dipstick: NEGATIVE
Ketones, ur: NEGATIVE mg/dL
LEUKOCYTES UA: NEGATIVE
Nitrite: NEGATIVE
Protein, ur: NEGATIVE mg/dL
SPECIFIC GRAVITY, URINE: 1.027 (ref 1.005–1.030)
pH: 7 (ref 5.0–8.0)

## 2017-07-25 LAB — POCT PREGNANCY, URINE: Preg Test, Ur: NEGATIVE

## 2017-07-25 LAB — TROPONIN I: Troponin I: <0.03 ng/mL (ref <0.03)

## 2017-07-25 MED ORDER — SUCRALFATE 1 G PO TABS: 1.0000 g | ORAL_TABLET | Freq: Four times a day (QID) | ORAL | 0 refills | Status: DC

## 2017-07-25 MED ORDER — INSULIN NPH ISOPHANE & REGULAR (70-30) 100 UNIT/ML ~~LOC~~ SUSP: 38.0000 [IU] | Freq: Two times a day (BID) | SUBCUTANEOUS | 0 refills | Status: DC

## 2017-07-25 MED ORDER — LORAZEPAM 1 MG PO TABS
1.0000 mg | ORAL_TABLET | Freq: Once | ORAL | Status: AC
  Administered 2017-07-25: 1 mg via ORAL
  Filled 2017-07-25: qty 1

## 2017-07-25 MED ORDER — FAMOTIDINE 40 MG PO TABS: 40.0000 mg | ORAL_TABLET | Freq: Every evening | ORAL | 1 refills | Status: DC

## 2017-07-25 MED ORDER — GI COCKTAIL ~~LOC~~
30.0000 mL | Freq: Once | ORAL | Status: AC
  Administered 2017-07-25: 30 mL via ORAL
  Filled 2017-07-25: qty 30

## 2017-07-25 NOTE — ED Triage Notes (Signed)
Pt has central chest pain for 3 days.  Pt also reports sob. No cough.  No n/v/d.  Pt states pain raidates into back. Pt alert and tearful in triage.

## 2017-07-25 NOTE — ED Notes (Signed)
poct pregnancy Negative 

## 2017-07-25 NOTE — Discharge Instructions (Addendum)
Please seek medical attention for any high fevers, chest pain, shortness of breath, change in behavior, persistent vomiting, bloody stool or any other new or concerning symptoms.  

## 2017-07-25 NOTE — ED Provider Notes (Signed)
[facility] Emergency Department Provider Note   ____________________________________________   I have reviewed the triage vital signs and the nursing notes.   HISTORY  Chief Complaint Chest Pain   History limited by: Not Limited   HPI Caitlyn Fuller is a 42 y.o. female who presents to the emergency department today with primary concerns for chest pressure.  She states it is been going on for about a month and a half.  Is located in the center part of her chest.  It is worse at night.  She does think that a lot of it could be stress related.  She states she becomes very anxious at night and feels like she might not wake up.  She states she has a history of anxiety and has been on medications in the past.  Patient has had some associated shortness of breath.  She is also having problems with abdominal pain.  This is also been going on for quite some time.  She describes as burning.  Located in the upper abdomen.    Per medical record review patient has a history of diabetes hypertension  Past Medical History:  Diagnosis Date  . Asthma   . Diabetes mellitus without complication (HCC)   . Hernia   . Hypertension   . Kidney infection   . Shingles     Patient Active Problem List   Diagnosis Date Noted  . Hyperglycemia without ketosis   . Chronic pain   . Herpes zoster   . Renal mass, left   . Essential hypertension   . Hyperglycemia 11/01/2014    Past Surgical History:  Procedure Laterality Date  . ABDOMINAL SURGERY    . CESAREAN SECTION    . CHOLECYSTECTOMY    . colonscopy    . TUBAL LIGATION      Prior to Admission medications   Medication Sig Start Date End Date Taking? Authorizing Provider  albuterol (PROVENTIL HFA;VENTOLIN HFA) 108 (90 Base) MCG/ACT inhaler Inhale 2 puffs into the lungs every 6 (six) hours as needed for wheezing or shortness of breath. 01/12/16   Darci Current, MD  albuterol (PROVENTIL) (2.5 MG/3ML) 0.083%  nebulizer solution Take 3 mLs (2.5 mg total) by nebulization every 4 (four) hours as needed for wheezing or shortness of breath. 01/12/16   Darci Current, MD  cyclobenzaprine (FLEXERIL) 10 MG tablet Take 1 tablet (10 mg total) 3 (three) times daily as needed by mouth for muscle spasms. 11/16/16   Fayrene Helper, PA-C  dicyclomine (BENTYL) 20 MG tablet Take 1 tablet (20 mg total) by mouth every 6 (six) hours as needed. 09/21/16   Irean Hong, MD  etodolac (LODINE) 200 MG capsule Take 1 capsule (200 mg total) by mouth every 8 (eight) hours. Patient not taking: Reported on 04/07/2016 03/15/16   Rebecka Apley, MD  furosemide (LASIX) 20 MG tablet Take 1 tablet (20 mg total) by mouth daily. 03/06/16 03/06/17  Minna Antis, MD  HYDROcodone-acetaminophen (NORCO/VICODIN) 5-325 MG tablet Take 1 tablet by mouth every 6 (six) hours as needed for moderate pain. 04/07/16   Lawyer, Cristal Deer, PA-C  insulin NPH-regular Human (NOVOLIN 70/30) (70-30) 100 UNIT/ML injection Inject 52 units under the skin in the morning and 48 units under the skin at night Patient taking differently: Inject 38-48 Units into the skin 2 (two) times daily with a meal. Inject 48 units under the skin in the morning and 38 units under the skin at night 06/13/15   Lucrezia Europe  P, MD  lisinopril (PRINIVIL,ZESTRIL) 10 MG tablet Take 10 mg by mouth daily.    [provider]  naproxen (NAPROSYN) 500 MG tablet Take 1 tablet (500 mg total) by mouth 2 (two) times daily with a meal. Patient not taking: Reported on 04/07/2016 04/26/15   Jene Every, MD  naproxen (NAPROSYN) 500 MG tablet Take 1 tablet (500 mg total) 2 (two) times daily with a meal by mouth. 11/14/16 11/14/17  Willy Eddy, MD  ondansetron (ZOFRAN ODT) 4 MG disintegrating tablet Take 1 tablet (4 mg total) by mouth every 8 (eight) hours as needed for nausea or vomiting. 09/21/16   Irean Hong, MD  oxyCODONE (ROXICODONE) 15 MG immediate release tablet Take 1 tablet (15 mg  total) every 8 (eight) hours as needed by mouth for pain. 11/16/16   Fayrene Helper, PA-C    Allergies Gabapentin; Morphine and related; Darvocet [propoxyphene n-acetaminophen]; Toradol [ketorolac tromethamine]; Ultram [tramadol]; and Vancomycin  No family history on file.  Social History Social History   Tobacco Use  . Smoking status: Current Every Day Smoker    Packs/day: 0.50    Types: Cigarettes  . Smokeless tobacco: Never Used  Substance Use Topics  . Alcohol use: No  . Drug use: No    Review of Systems Constitutional: No fever/chills Eyes: No visual changes. ENT: No sore throat. Cardiovascular: Positive for chest pressure Respiratory: Positive for shortness of breath. Gastrointestinal: No abdominal pain.  No nausea, no vomiting.  No diarrhea.   Genitourinary: Negative for dysuria. Musculoskeletal: Negative for back pain. Skin: Negative for rash. Neurological: Negative for headaches, focal weakness or numbness.  ____________________________________________   PHYSICAL EXAM:  VITAL SIGNS: ED Triage Vitals  Enc Vitals Group     BP 07/25/17 1954 (!) 165/101     Pulse Rate 07/25/17 1954 87     Resp 07/25/17 1954 20     Temp 07/25/17 1954 98.3 F (36.8 C)     Temp Source 07/25/17 1954 Oral     SpO2 07/25/17 1954 99 %     Weight 07/25/17 1955 265 lb (120.2 kg)     Height 07/25/17 1955 4\' 10"  (1.473 m)     Head Circumference --      Peak Flow --      Pain Score 07/25/17 1954 9   Constitutional: Alert and oriented.  Tearful Eyes: Conjunctivae are normal.  ENT      Head: Normocephalic and atraumatic.      Nose: No congestion/rhinnorhea.      Mouth/Throat: Mucous membranes are moist.      Neck: No stridor. Hematological/Lymphatic/Immunilogical: No cervical lymphadenopathy. Cardiovascular: Normal rate, regular rhythm.  No murmurs, rubs, or gallops.  Respiratory: Normal respiratory effort without tachypnea nor retractions. Breath sounds are clear and equal  bilaterally. No wheezes/rales/rhonchi. Gastrointestinal: Soft and non tender. No rebound. No guarding.  Genitourinary: Deferred Musculoskeletal: Normal range of motion in all extremities. No lower extremity edema. Neurologic:  Normal speech and language. No gross focal neurologic deficits are appreciated.  Skin:  Skin is warm, dry and intact. No rash noted. Psychiatric: Appears quite anxious, tearful.  ____________________________________________    LABS (pertinent positives/negatives)  Upreg negative BMP na 133, k 4.1, glu 365, cr 0.66 CBC wbc 11.6, hgb 13.2, plt 226 Trop <0.03 UA not consistent with infection ____________________________________________   EKG  I, Phineas Semen, attending physician, personally viewed and interpreted this EKG  EKG Time: 1955 Rate: 86 Rhythm: normal sinus rhythm Axis: rightward axis Intervals: qtc 435 QRS:  narrow, q waves v1, v2 ST changes: no st elevation Impression: abnormal ekg  ____________________________________________    RADIOLOGY  CXR No acute disease   ____________________________________________   PROCEDURES  Procedures  ____________________________________________   INITIAL IMPRESSION / ASSESSMENT AND PLAN / ED COURSE  Pertinent labs & imaging results that were available during my care of the patient were reviewed by me and considered in my medical decision making (see chart for details).   Patient presented because of concern for chest pain. Has been going on for over one month. Work up shows elevated glucose, however no evidence of dka. She did feel better after gi cocktail. Did discuss possibility of gastritis,esophagitis. WIll plan on prescribing sucralfate and pepcid. Discussed importance of follow up with pcp.   ____________________________________________   FINAL CLINICAL IMPRESSION(S) / ED DIAGNOSES  Final diagnoses:  Nonspecific chest pain     Note: This dictation was prepared with Dragon  dictation. Any transcriptional errors that result from this process are unintentional     Phineas SemenGoodman, Yezenia Fredrick, MD 07/25/17 [id number]

## 2017-07-25 NOTE — ED Notes (Signed)
Discharge info reviewed with patient. Prescriptions reviewed. Patients stated understanding of info. Patient educated on GERD and diet. Patient denies pain. Info signed and patient discharged.

## 2017-08-01 ENCOUNTER — Emergency Department
Admission: EM | Admit: 2017-08-01 | Discharge: 2017-08-01 | Disposition: A | Discharge status: Home / Self Care | Payer: Medicaid Other | Attending: Emergency Medicine | Admitting: Emergency Medicine

## 2017-08-01 ENCOUNTER — Encounter: Payer: Self-pay | Admitting: Emergency Medicine

## 2017-08-01 ENCOUNTER — Emergency Department: Payer: Medicaid Other

## 2017-08-01 DIAGNOSIS — Y33XXXA Other specified events, undetermined intent, initial encounter: Secondary | ICD-10-CM | POA: Diagnosis not present

## 2017-08-01 DIAGNOSIS — F17210 Nicotine dependence, cigarettes, uncomplicated: Secondary | ICD-10-CM | POA: Insufficient documentation

## 2017-08-01 DIAGNOSIS — S90821A Blister (nonthermal), right foot, initial encounter: Secondary | ICD-10-CM | POA: Insufficient documentation

## 2017-08-01 DIAGNOSIS — R0602 Shortness of breath: Secondary | ICD-10-CM | POA: Insufficient documentation

## 2017-08-01 DIAGNOSIS — Z794 Long term (current) use of insulin: Secondary | ICD-10-CM | POA: Insufficient documentation

## 2017-08-01 DIAGNOSIS — I10 Essential (primary) hypertension: Secondary | ICD-10-CM | POA: Diagnosis not present

## 2017-08-01 DIAGNOSIS — Z79899 Other long term (current) drug therapy: Secondary | ICD-10-CM | POA: Insufficient documentation

## 2017-08-01 DIAGNOSIS — R0601 Orthopnea: Secondary | ICD-10-CM | POA: Insufficient documentation

## 2017-08-01 DIAGNOSIS — Y939 Activity, unspecified: Secondary | ICD-10-CM | POA: Insufficient documentation

## 2017-08-01 DIAGNOSIS — R079 Chest pain, unspecified: Secondary | ICD-10-CM | POA: Diagnosis not present

## 2017-08-01 DIAGNOSIS — Y998 Other external cause status: Secondary | ICD-10-CM | POA: Diagnosis not present

## 2017-08-01 DIAGNOSIS — E119 Type 2 diabetes mellitus without complications: Secondary | ICD-10-CM | POA: Insufficient documentation

## 2017-08-01 DIAGNOSIS — J45909 Unspecified asthma, uncomplicated: Secondary | ICD-10-CM | POA: Insufficient documentation

## 2017-08-01 DIAGNOSIS — Y929 Unspecified place or not applicable: Secondary | ICD-10-CM | POA: Insufficient documentation

## 2017-08-01 DIAGNOSIS — M722 Plantar fascial fibromatosis: Secondary | ICD-10-CM | POA: Insufficient documentation

## 2017-08-01 LAB — COMPREHENSIVE METABOLIC PANEL
ALBUMIN: 3.3 g/dL — AB (ref 3.5–5.0)
ALT: 26 U/L (ref 0–44)
ANION GAP: 6 (ref 5–15)
AST: 23 U/L (ref 15–41)
Alkaline Phosphatase: 78 U/L (ref 38–126)
BILIRUBIN TOTAL: 0.5 mg/dL (ref 0.3–1.2)
BUN: 9 mg/dL (ref 6–20)
CO2: 31 mmol/L (ref 22–32)
Calcium: 8.1 mg/dL — ABNORMAL LOW (ref 8.9–10.3)
Chloride: 98 mmol/L (ref 98–111)
Creatinine, Ser: 0.59 mg/dL (ref 0.44–1.00)
GLUCOSE: 288 mg/dL — AB (ref 70–99)
POTASSIUM: 3.9 mmol/L (ref 3.5–5.1)
Sodium: 135 mmol/L (ref 135–145)
TOTAL PROTEIN: 6.9 g/dL (ref 6.5–8.1)

## 2017-08-01 LAB — CBC WITH DIFFERENTIAL/PLATELET
BASOS ABS: 0.1 10*3/uL (ref 0–0.1)
Basophils Relative: 0 %
EOS PCT: 1 %
Eosinophils Absolute: 0.1 10*3/uL (ref 0–0.7)
HCT: 39.6 % (ref 35.0–47.0)
Hemoglobin: 13.4 g/dL (ref 12.0–16.0)
Lymphocytes Relative: 29 %
Lymphs Abs: 3.3 10*3/uL (ref 1.0–3.6)
MCH: 26.5 pg (ref 26.0–34.0)
MCHC: 33.7 g/dL (ref 32.0–36.0)
MCV: 78.5 fL — ABNORMAL LOW (ref 80.0–100.0)
Monocytes Absolute: 0.7 10*3/uL (ref 0.2–0.9)
Monocytes Relative: 6 %
Neutro Abs: 7.3 10*3/uL — ABNORMAL HIGH (ref 1.4–6.5)
Neutrophils Relative %: 64 %
PLATELETS: 251 10*3/uL (ref 150–440)
RBC: 5.05 MIL/uL (ref 3.80–5.20)
RDW: 16.1 % — ABNORMAL HIGH (ref 11.5–14.5)
WBC: 11.5 10*3/uL — AB (ref 3.6–11.0)

## 2017-08-01 LAB — TROPONIN I: Troponin I: <0.03 ng/mL (ref <0.03)

## 2017-08-01 LAB — BRAIN NATRIURETIC PEPTIDE: B NATRIURETIC PEPTIDE 5: 62 pg/mL (ref 0.0–100.0)

## 2017-08-01 NOTE — ED Provider Notes (Signed)
Lurline IdolI, Queen Abbett, attending physician, personally viewed and interpreted this EKG  EKG Time: 1917 Rate: 77 Rhythm: normal sinus rhythm Axis: normal Intervals: qtc 430 QRS: narrow, q waves v1,v2, v3, v4 ST changes: no st elevation Impression: abnormal Maryan Pulsekg    Shantea Poulton, MD 08/01/17 1919

## 2017-08-01 NOTE — ED Triage Notes (Signed)
Patient presents to the ED with a small wound to her right foot between her great and 2nd toes.  Patient states she noticed area [place]  Patient states last night when she stood up she began to have pain in her left foot.  Patient ambulatory with steady gait in triage.  No obvious distress at this time.

## 2017-08-01 NOTE — ED Provider Notes (Signed)
[facility] Emergency Department Provider Note  ____________________________________________  Time seen: Approximately 7:06 PM  I have reviewed the triage vital signs and the nursing notes.   HISTORY  Chief Complaint Foot Pain and Foot Problem    HPI Caitlyn Fuller is a 42 y.o. female who presents the emergency department with multiple medical complaints.  Patient presents the emergency department complaining of chest pain, shortness of breath, feeling of anxiety at nighttime, bilateral lower extremity edema, bilateral lower feet pain, skin lesion to the interdigital space of the first and second digit.  Patient presents complaining of increased weight gain, chest pain, shortness of breath that has been ongoing since she quit smoking several weeks to months ago.  Patient reports that she is gained 40 pounds in this..  Patient reports that anytime she lays down she feels short of breath and has difficulty sleeping due to to the sensation.  She reports associated with anxiety.  Over the past several days, patient reports that she has had nontraumatic swelling of bilateral lower extremities.  No history of cardiac issues.  Patient denies any headache, visual changes, abdominal pain, nausea or vomiting.  No medications for any of these complaints prior to arrival.    Past Medical History:  Diagnosis Date  . Asthma   . Diabetes mellitus without complication (HCC)   . Hernia   . Hypertension   . Kidney infection   . Shingles     Patient Active Problem List   Diagnosis Date Noted  . Hyperglycemia without ketosis   . Chronic pain   . Herpes zoster   . Renal mass, left   . Essential hypertension   . Hyperglycemia 11/01/2014    Past Surgical History:  Procedure Laterality Date  . ABDOMINAL SURGERY    . CESAREAN SECTION    . CHOLECYSTECTOMY    . colonscopy    . TUBAL LIGATION      Prior to Admission medications   Medication Sig Start Date End Date  Taking? Authorizing Provider  albuterol (PROVENTIL HFA;VENTOLIN HFA) 108 (90 Base) MCG/ACT inhaler Inhale 2 puffs into the lungs every 6 (six) hours as needed for wheezing or shortness of breath. 01/12/16   Darci Current, MD  albuterol (PROVENTIL) (2.5 MG/3ML) 0.083% nebulizer solution Take 3 mLs (2.5 mg total) by nebulization every 4 (four) hours as needed for wheezing or shortness of breath. 01/12/16   Darci Current, MD  cyclobenzaprine (FLEXERIL) 10 MG tablet Take 1 tablet (10 mg total) 3 (three) times daily as needed by mouth for muscle spasms. 11/16/16   Fayrene Helper, PA-C  dicyclomine (BENTYL) 20 MG tablet Take 1 tablet (20 mg total) by mouth every 6 (six) hours as needed. 09/21/16   Irean Hong, MD  etodolac (LODINE) 200 MG capsule Take 1 capsule (200 mg total) by mouth every 8 (eight) hours. Patient not taking: Reported on 04/07/2016 03/15/16   Rebecka Apley, MD  famotidine (PEPCID) 40 MG tablet Take 1 tablet (40 mg total) by mouth every evening. 07/25/17 07/25/18  Phineas Semen, MD  furosemide (LASIX) 20 MG tablet Take 1 tablet (20 mg total) by mouth daily. 03/06/16 03/06/17  Minna Antis, MD  HYDROcodone-acetaminophen (NORCO/VICODIN) 5-325 MG tablet Take 1 tablet by mouth every 6 (six) hours as needed for moderate pain. 04/07/16   Lawyer, Cristal Deer, PA-C  insulin NPH-regular Human (NOVOLIN 70/30) (70-30) 100 UNIT/ML injection Inject 38-48 Units into the skin 2 (two) times daily with a meal. Inject 48 units  under the skin in the morning and 38 units under the skin at night 07/25/17   Phineas SemenGoodman, Graydon, MD  lisinopril (PRINIVIL,ZESTRIL) 10 MG tablet Take 10 mg by mouth daily.    [provider]  naproxen (NAPROSYN) 500 MG tablet Take 1 tablet (500 mg total) by mouth 2 (two) times daily with a meal. Patient not taking: Reported on 04/07/2016 04/26/15   Jene EveryKinner, Robert, MD  naproxen (NAPROSYN) 500 MG tablet Take 1 tablet (500 mg total) 2 (two) times daily with a meal by mouth.  11/14/16 11/14/17  Willy Eddyobinson, Patrick, MD  ondansetron (ZOFRAN ODT) 4 MG disintegrating tablet Take 1 tablet (4 mg total) by mouth every 8 (eight) hours as needed for nausea or vomiting. 09/21/16   Irean HongSung, Jade J, MD  oxyCODONE (ROXICODONE) 15 MG immediate release tablet Take 1 tablet (15 mg total) every 8 (eight) hours as needed by mouth for pain. 11/16/16   Fayrene Helperran, Bowie, PA-C  sucralfate (CARAFATE) 1 g tablet Take 1 tablet (1 g total) by mouth 4 (four) times daily. 07/25/17   Phineas SemenGoodman, Graydon, MD    Allergies Gabapentin; Morphine and related; Darvocet [propoxyphene n-acetaminophen]; Toradol [ketorolac tromethamine]; Ultram [tramadol]; and Vancomycin  No family history on file.  Social History Social History   Tobacco Use  . Smoking status: Current Every Day Smoker    Packs/day: 0.50    Types: Cigarettes  . Smokeless tobacco: Never Used  Substance Use Topics  . Alcohol use: No  . Drug use: No     Review of Systems  Constitutional: No fever/chills Eyes: No visual changes. No discharge ENT: No upper respiratory complaints. Cardiovascular: Positive chest pain. Respiratory: no cough.  Positive SOB. Gastrointestinal: No abdominal pain.  No nausea, no vomiting.  No diarrhea.  No constipation. Genitourinary: Negative for dysuria. No hematuria Musculoskeletal: Negative for musculoskeletal pain.  Positive for bilateral lower extremity Skin: Negative for rash, abrasions, lacerations, ecchymosis.  Skin lesion between the first and second digits right foot. Neurological: Negative for headaches, focal weakness or numbness. Psychological: Reports increased anxiety, specifically at nighttime with worsening shortness of breath. 10-point ROS otherwise negative.  ____________________________________________   PHYSICAL EXAM:  VITAL SIGNS: ED Triage Vitals  Enc Vitals Group     BP 08/01/17 1848 (!) 189/105     Pulse Rate 08/01/17 1848 83     Resp 08/01/17 1848 20     Temp 08/01/17 1848 98 F  (36.7 C)     Temp Source 08/01/17 1848 Oral     SpO2 08/01/17 1848 97 %     Weight 08/01/17 1848 266 lb 2 oz (120.7 kg)     Height 08/01/17 1848 5' (1.524 m)     Head Circumference --      Peak Flow --      Pain Score 08/01/17 1852 9     Pain Loc --      Pain Edu? --      Excl. in GC? --      Constitutional: Alert and oriented. Well appearing and in no acute distress.  Morbidly obese. Eyes: Conjunctivae are normal. PERRL. EOMI. Head: Atraumatic. ENT:      Ears:       Nose: No congestion/rhinnorhea.      Mouth/Throat: Mucous membranes are moist.  Neck: No stridor.   Hematological/Lymphatic/Immunilogical: No cervical lymphadenopathy. Cardiovascular: Normal rate, regular rhythm. Normal S1 and S2.  Good peripheral circulation. Respiratory: Normal respiratory effort without tachypnea or retractions. Lungs CTAB. Good air entry to the bases with  no decreased or absent breath sounds. Musculoskeletal: Full range of motion to all extremities. No gross deformities appreciated.  Visualization of the lower extremity reveals no significant edema of unilateral lower extremity.  Patient is morbidly obese with no gross indication of peripheral edema.  No pitting edema.  Dorsalis pedis pulse intact bilateral lower extremities.  Sensation intact and equal bilateral lower extremities. Neurologic:  Normal speech and language. No gross focal neurologic deficits are appreciated.  Skin:  Skin is warm, dry and intact. No rash noted.  Superficial skin lesion consistent with ruptured blister noted in the interdigital space between the first and second digit.  No surrounding erythema or edema consistent with infection. Psychiatric: Mood and affect are normal. Speech and behavior are normal. Patient exhibits appropriate insight and judgement.   ____________________________________________   LABS (all labs ordered are listed, but only abnormal results are displayed)  Labs Reviewed  COMPREHENSIVE METABOLIC  PANEL - Abnormal; Notable for the following components:      Result Value   Glucose, Bld 288 (*)    Calcium 8.1 (*)    Albumin 3.3 (*)    All other components within normal limits  CBC WITH DIFFERENTIAL/PLATELET - Abnormal; Notable for the following components:   WBC 11.5 (*)    MCV 78.5 (*)    RDW 16.1 (*)    Neutro Abs 7.3 (*)    All other components within normal limits  TROPONIN I  BRAIN NATRIURETIC PEPTIDE   ____________________________________________  EKG  ED ECG REPORT I, Delorise Royals Arya Luttrull,  personally viewed and interpreted this ECG.   Date: 08/01/2017  EKG Time: 1917 hrs.  Rate: 77 bpm  Rhythm: unchanged from previous tracings, normal sinus rhythm  Axis: Normal axis  Intervals:none  ST&T Change: No ST elevations or depressions noted.  No STEMI.  Normal sinus rhythm.  Unchanged from previous EKG.  ____________________________________________  RADIOLOGY I personally viewed and evaluated these images as part of my medical decision making, as well as reviewing the written report by the radiologist.  I concur with radiologist finding of no acute cardiopulmonary abnormality.  Dg Chest 2 View  Result Date: 08/01/2017 CLINICAL DATA:  42 year old female with a history of lower extremity swelling EXAM: CHEST - 2 VIEW COMPARISON:  07/25/2017 FINDINGS: Cardiomediastinal silhouette unchanged in size and contour. No evidence of central vascular congestion. No interlobular septal thickening. No pneumothorax or pleural effusion. No confluent airspace disease. No displaced fracture IMPRESSION: Negative for acute cardiopulmonary disease Electronically Signed   By: Gilmer Mor D.O.   On: 08/01/2017 19:38    ____________________________________________    PROCEDURES  Procedure(s) performed:    Procedures    Medications - No data to display   ____________________________________________   INITIAL IMPRESSION / ASSESSMENT AND PLAN / ED COURSE  Pertinent labs &  imaging results that were available during my care of the patient were reviewed by me and considered in my medical decision making (see chart for details).  Review of the [place] CSRS was performed in accordance of the [facility] prior to dispensing any controlled drugs.      Patient's diagnosis is consistent with nonspecific chest pain, shortness of breath, ruptured blister to the right foot.  Patient presents with multiple medical complaints including chest pain, shortness of breath.  Exam was overall reassuring.  Labs, chest x-ray, EKG are reassuring.  I believe patient's habitus, lifestyle is largely contributory to her symptoms.  No indication for referral to cardiology at this time.  Patient is given wound  care instructions for ruptured blister to the foot.  No prescriptions at this time.  Patient will follow-up with primary care as needed. Patient is given ED precautions to return to the ED for any worsening or new symptoms.     ____________________________________________  FINAL CLINICAL IMPRESSION(S) / ED DIAGNOSES  Final diagnoses:  Chest pain, unspecified type  Shortness of breath  Orthopnea  Blister of right foot, initial encounter  Plantar fasciitis of left foot      NEW MEDICATIONS STARTED DURING THIS VISIT:  ED Discharge Orders    None          This chart was dictated using voice recognition software/Dragon. Despite best efforts to proofread, errors can occur which can change the meaning. Any change was purely unintentional.    Racheal Patches, PA-C 08/01/17 2054    Don Perking [place], MD 08/03/17 [phone]

## 2017-08-22 ENCOUNTER — Other Ambulatory Visit: Payer: Self-pay

## 2017-08-22 ENCOUNTER — Encounter: Payer: Self-pay | Admitting: Emergency Medicine

## 2017-08-22 ENCOUNTER — Emergency Department
Admission: EM | Admit: 2017-08-22 | Discharge: 2017-08-22 | Disposition: A | Discharge status: Home / Self Care | Payer: Medicaid Other | Attending: Emergency Medicine | Admitting: Emergency Medicine

## 2017-08-22 DIAGNOSIS — F17210 Nicotine dependence, cigarettes, uncomplicated: Secondary | ICD-10-CM | POA: Insufficient documentation

## 2017-08-22 DIAGNOSIS — Z79899 Other long term (current) drug therapy: Secondary | ICD-10-CM | POA: Insufficient documentation

## 2017-08-22 DIAGNOSIS — E1165 Type 2 diabetes mellitus with hyperglycemia: Secondary | ICD-10-CM | POA: Insufficient documentation

## 2017-08-22 DIAGNOSIS — M722 Plantar fascial fibromatosis: Secondary | ICD-10-CM | POA: Insufficient documentation

## 2017-08-22 DIAGNOSIS — I10 Essential (primary) hypertension: Secondary | ICD-10-CM | POA: Diagnosis not present

## 2017-08-22 DIAGNOSIS — Z794 Long term (current) use of insulin: Secondary | ICD-10-CM | POA: Insufficient documentation

## 2017-08-22 DIAGNOSIS — M79672 Pain in left foot: Secondary | ICD-10-CM | POA: Diagnosis present

## 2017-08-22 DIAGNOSIS — J45909 Unspecified asthma, uncomplicated: Secondary | ICD-10-CM | POA: Diagnosis not present

## 2017-08-22 MED ORDER — OXYCODONE-ACETAMINOPHEN 5-325 MG PO TABS
2.0000 | ORAL_TABLET | Freq: Once | ORAL | Status: AC
  Administered 2017-08-22: 2 via ORAL
  Filled 2017-08-22: qty 2

## 2017-08-22 MED ORDER — INSULIN DETEMIR 100 UNIT/ML FLEXPEN: PEN_INJECTOR | SUBCUTANEOUS | 3 refills | Status: DC

## 2017-08-22 MED ORDER — ONDANSETRON 4 MG PO TBDP
8.0000 mg | ORAL_TABLET | Freq: Once | ORAL | Status: AC
  Administered 2017-08-22: 8 mg via ORAL
  Filled 2017-08-22: qty 2

## 2017-08-22 MED ORDER — OXYCODONE-ACETAMINOPHEN 5-325 MG PO TABS: 1.0000 | ORAL_TABLET | Freq: Four times a day (QID) | ORAL | 0 refills | Status: AC | PRN

## 2017-08-22 NOTE — ED Triage Notes (Signed)
Pt reports seeing her podiatrist and received shots in her feet and ankle and cont to have pain

## 2017-08-22 NOTE — ED Provider Notes (Signed)
[facility] Emergency Department Provider Note ____________________________________________   First MD Initiated Contact with Patient 08/22/17 [phone]     (approximate)  I have reviewed the triage vital signs and the nursing notes.   HISTORY  Chief Complaint Foot Pain    HPI Caitlyn Fuller is a 42 y.o. female with PMH as noted below including recent diagnosis of plantar she had us who presents with bilateral foot pain for the last day.  The patient states that she had shots placed into bilateral feet by a doctor today, and states that since then the pain has been severe and persistent.  She does not have any pain medication at home.  The patient reports chronic bilateral foot pain that was diagnosed to plantar fasciitis.  She also has bone spurs and bursitis.  She denies swelling or fever.  No trauma.  Past Medical History:  Diagnosis Date  . Asthma   . Diabetes mellitus without complication (HCC)   . Hernia   . Hypertension   . Kidney infection   . Shingles     Patient Active Problem List   Diagnosis Date Noted  . Hyperglycemia without ketosis   . Chronic pain   . Herpes zoster   . Renal mass, left   . Essential hypertension   . Hyperglycemia 11/01/2014    Past Surgical History:  Procedure Laterality Date  . ABDOMINAL SURGERY    . CESAREAN SECTION    . CHOLECYSTECTOMY    . colonscopy    . TUBAL LIGATION      Prior to Admission medications   Medication Sig Start Date End Date Taking? Authorizing Provider  albuterol (PROVENTIL HFA;VENTOLIN HFA) 108 (90 Base) MCG/ACT inhaler Inhale 2 puffs into the lungs every 6 (six) hours as needed for wheezing or shortness of breath. 01/12/16   Darci CurrentBrown, Dardanelle N, MD  albuterol (PROVENTIL) (2.5 MG/3ML) 0.083% nebulizer solution Take 3 mLs (2.5 mg total) by nebulization every 4 (four) hours as needed for wheezing or shortness of breath. 01/12/16   Darci CurrentBrown,  N, MD  cyclobenzaprine (FLEXERIL) 10 MG tablet  Take 1 tablet (10 mg total) 3 (three) times daily as needed by mouth for muscle spasms. 11/16/16   Fayrene Helperran, Bowie, PA-C  dicyclomine (BENTYL) 20 MG tablet Take 1 tablet (20 mg total) by mouth every 6 (six) hours as needed. 09/21/16   Irean HongSung, Jade J, MD  etodolac (LODINE) 200 MG capsule Take 1 capsule (200 mg total) by mouth every 8 (eight) hours. Patient not taking: Reported on 04/07/2016 03/15/16   Rebecka ApleyWebster, Allison P, MD  famotidine (PEPCID) 40 MG tablet Take 1 tablet (40 mg total) by mouth every evening. 07/25/17 07/25/18  Phineas SemenGoodman, Graydon, MD  furosemide (LASIX) 20 MG tablet Take 1 tablet (20 mg total) by mouth daily. 03/06/16 03/06/17  Minna AntisPaduchowski, Kevin, MD  HYDROcodone-acetaminophen (NORCO/VICODIN) 5-325 MG tablet Take 1 tablet by mouth every 6 (six) hours as needed for moderate pain. 04/07/16   Lawyer, Cristal Deerhristopher, PA-C  Insulin Detemir (LEVEMIR FLEXTOUCH) 100 UNIT/ML Pen Inject 38 Units into the skin every morning AND 28 Units at bedtime. 08/22/17 12/20/17  Dionne BucySiadecki, Rahsaan Weakland, MD  insulin NPH-regular Human (NOVOLIN 70/30) (70-30) 100 UNIT/ML injection Inject 38-48 Units into the skin 2 (two) times daily with a meal. Inject 48 units under the skin in the morning and 38 units under the skin at night 07/25/17   Phineas SemenGoodman, Graydon, MD  lisinopril (PRINIVIL,ZESTRIL) 10 MG tablet Take 10 mg by mouth daily.    [provider]  naproxen (NAPROSYN) 500 MG tablet Take 1 tablet (500 mg total) by mouth 2 (two) times daily with a meal. Patient not taking: Reported on 04/07/2016 04/26/15   Jene Every, MD  naproxen (NAPROSYN) 500 MG tablet Take 1 tablet (500 mg total) 2 (two) times daily with a meal by mouth. 11/14/16 11/14/17  Willy Eddy, MD  ondansetron (ZOFRAN ODT) 4 MG disintegrating tablet Take 1 tablet (4 mg total) by mouth every 8 (eight) hours as needed for nausea or vomiting. 09/21/16   Irean Hong, MD  oxyCODONE (ROXICODONE) 15 MG immediate release tablet Take 1 tablet (15 mg total) every 8 (eight)  hours as needed by mouth for pain. 11/16/16   Fayrene Helper, PA-C  oxyCODONE-acetaminophen (PERCOCET) 5-325 MG tablet Take 1-2 tablets by mouth every 6 (six) hours as needed for up to 3 days for severe pain. 08/22/17 08/25/17  Dionne Bucy, MD  sucralfate (CARAFATE) 1 g tablet Take 1 tablet (1 g total) by mouth 4 (four) times daily. 07/25/17   Phineas Semen, MD    Allergies Gabapentin; Morphine and related; Darvocet [propoxyphene n-acetaminophen]; Toradol [ketorolac tromethamine]; Ultram [tramadol]; and Vancomycin  No family history on file.  Social History Social History   Tobacco Use  . Smoking status: Current Every Day Smoker    Packs/day: 0.50    Types: Cigarettes  . Smokeless tobacco: Never Used  Substance Use Topics  . Alcohol use: No  . Drug use: No    Review of Systems  Constitutional: No fever. Musculoskeletal: Positive for bilateral foot pain. Skin: Negative for rash. Neurological: Negative for focal weakness or numbness.   ____________________________________________   PHYSICAL EXAM:  VITAL SIGNS: ED Triage Vitals  Enc Vitals Group     BP 08/22/17 0427 (!) 174/111     Pulse Rate 08/22/17 0427 100     Resp 08/22/17 0427 20     Temp 08/22/17 0427 98.4 F (36.9 C)     Temp Source 08/22/17 0427 Oral     SpO2 08/22/17 0427 98 %     Weight 08/22/17 0427 266 lb 1.5 oz (120.7 kg)     Height --      Head Circumference --      Peak Flow --      Pain Score 08/22/17 0425 10     Pain Loc --      Pain Edu? --      Excl. in GC? --     Constitutional: Alert and oriented.  Relatively well appearing and in no acute distress. Eyes: Conjunctivae are normal.  Head: Atraumatic. Nose: No congestion/rhinnorhea. Mouth/Throat: Mucous membranes are moist.   Neck: Normal range of motion.  Cardiovascular: Good peripheral circulation. Respiratory: Normal respiratory effort. Gastrointestinal:  No distention.  Musculoskeletal: No lower extremity edema.  Extremities  warm and well perfused.  Bilateral feet with mild plantar tenderness.  No swelling, erythema, induration, or deformity. Neurologic:  Normal speech and language. No gross focal neurologic deficits are appreciated.  Skin:  Skin is warm and dry. No rash noted. Psychiatric: Mood and affect are normal. Speech and behavior are normal.  ____________________________________________   LABS (all labs ordered are listed, but only abnormal results are displayed)  Labs Reviewed - No data to display ____________________________________________  EKG   ____________________________________________  RADIOLOGY    ____________________________________________   PROCEDURES  Procedure(s) performed: No  Procedures  Critical Care performed: No ____________________________________________   INITIAL IMPRESSION / ASSESSMENT AND PLAN / ED COURSE  Pertinent labs &  imaging results that were available during my care of the patient were reviewed by me and considered in my medical decision making (see chart for details).  42 year old female with PMH as noted above and a recent diagnosis of plantar fasciitis presents with bilateral foot pain after she had injections into both feet today by a doctor for the fasciitis.  On exam, the patient is relatively well-appearing, the vital signs are normal except for hypertension and the exam is otherwise as described above.  There is no significant swelling, or any rash or cutaneous findings to the feet.  Presentation is consistent with exacerbation of plantar fasciitis and/or postprocedural pain.  There is no evidence of infection or other acute complication.  I [place] the [place].  She has had one prescription for a 5-day supply of oxycodone on 08/13/2017, and her last narcotic prescription before this was in January.  Therefore I feel that it would be appropriate to prescribe a small supply of a narcotic analgesic to  cover the patient for the next 3 days given that her pain should likely significantly improve within the first few days after the procedure, and she can follow-up with the doctor who performed the procedure.  The patient also requested a prescription for her test strips as well as for her Levemir insulin.  She feels comfortable to go home.  Return precautions given, and she expresses understanding.  ____________________________________________   FINAL CLINICAL IMPRESSION(S) / ED DIAGNOSES  Final diagnoses:  Plantar fasciitis      NEW MEDICATIONS STARTED DURING THIS VISIT:  New Prescriptions   INSULIN DETEMIR (LEVEMIR FLEXTOUCH) 100 UNIT/ML PEN    Inject 38 Units into the skin every morning AND 28 Units at bedtime.   OXYCODONE-ACETAMINOPHEN (PERCOCET) 5-325 MG TABLET    Take 1-2 tablets by mouth every 6 (six) hours as needed for up to 3 days for severe pain.     Note:  This document was prepared using Dragon voice recognition software and may include unintentional dictation errors.    Dionne BucySiadecki, Zylie Mumaw, MD 08/22/17 [phone]

## 2017-08-22 NOTE — ED Notes (Signed)
ED Provider at bedside. 

## 2017-08-22 NOTE — Discharge Instructions (Addendum)
All up with the doctor who performed the procedure.  Return to the ER for new, worsening, persistent severe pain, weakness or numbness, difficulty walking, swelling, rash, or any other new or worsening symptoms that concern you.

## 2017-08-30 ENCOUNTER — Emergency Department: Payer: Medicaid Other

## 2017-08-30 ENCOUNTER — Other Ambulatory Visit: Payer: Self-pay

## 2017-08-30 ENCOUNTER — Emergency Department
Admission: EM | Admit: 2017-08-30 | Discharge: 2017-08-30 | Disposition: A | Discharge status: Home / Self Care | Payer: Medicaid Other | Attending: Emergency Medicine | Admitting: Emergency Medicine

## 2017-08-30 ENCOUNTER — Encounter: Payer: Self-pay | Admitting: Emergency Medicine

## 2017-08-30 DIAGNOSIS — R079 Chest pain, unspecified: Secondary | ICD-10-CM | POA: Diagnosis present

## 2017-08-30 DIAGNOSIS — E119 Type 2 diabetes mellitus without complications: Secondary | ICD-10-CM | POA: Diagnosis not present

## 2017-08-30 DIAGNOSIS — F17210 Nicotine dependence, cigarettes, uncomplicated: Secondary | ICD-10-CM | POA: Diagnosis not present

## 2017-08-30 DIAGNOSIS — R0602 Shortness of breath: Secondary | ICD-10-CM | POA: Diagnosis not present

## 2017-08-30 DIAGNOSIS — I10 Essential (primary) hypertension: Secondary | ICD-10-CM | POA: Diagnosis not present

## 2017-08-30 DIAGNOSIS — J45909 Unspecified asthma, uncomplicated: Secondary | ICD-10-CM | POA: Diagnosis not present

## 2017-08-30 LAB — CBC
HEMATOCRIT: 44.7 % (ref 35.0–47.0)
HEMOGLOBIN: 14.7 g/dL (ref 12.0–16.0)
MCH: 26 pg (ref 26.0–34.0)
MCHC: 32.8 g/dL (ref 32.0–36.0)
MCV: 79.1 fL — ABNORMAL LOW (ref 80.0–100.0)
Platelets: 233 10*3/uL (ref 150–440)
RBC: 5.65 MIL/uL — AB (ref 3.80–5.20)
RDW: 16.3 % — ABNORMAL HIGH (ref 11.5–14.5)
WBC: 11.9 10*3/uL — ABNORMAL HIGH (ref 3.6–11.0)

## 2017-08-30 LAB — BASIC METABOLIC PANEL
ANION GAP: 7 (ref 5–15)
BUN: 17 mg/dL (ref 6–20)
CO2: 32 mmol/L (ref 22–32)
Calcium: 8.8 mg/dL — ABNORMAL LOW (ref 8.9–10.3)
Chloride: 92 mmol/L — ABNORMAL LOW (ref 98–111)
Creatinine, Ser: 0.71 mg/dL (ref 0.44–1.00)
GLUCOSE: 423 mg/dL — AB (ref 70–99)
POTASSIUM: 3.9 mmol/L (ref 3.5–5.1)
Sodium: 131 mmol/L — ABNORMAL LOW (ref 135–145)

## 2017-08-30 LAB — GLUCOSE, CAPILLARY
GLUCOSE-CAPILLARY: 321 mg/dL — AB (ref 70–99)
GLUCOSE-CAPILLARY: 446 mg/dL — AB (ref 70–99)
Glucose-Capillary: 417 mg/dL — ABNORMAL HIGH (ref 70–99)

## 2017-08-30 LAB — TROPONIN I

## 2017-08-30 MED ORDER — INSULIN ASPART 100 UNIT/ML ~~LOC~~ SOLN
10.0000 [IU] | Freq: Once | SUBCUTANEOUS | Status: AC
  Administered 2017-08-30: 10 [IU] via INTRAVENOUS
  Filled 2017-08-30: qty 0.1
  Filled 2017-08-30: qty 1

## 2017-08-30 MED ORDER — LORAZEPAM 2 MG/ML IJ SOLN
0.5000 mg | Freq: Once | INTRAMUSCULAR | Status: AC
  Administered 2017-08-30: 0.5 mg via INTRAVENOUS
  Filled 2017-08-30: qty 1

## 2017-08-30 MED ORDER — GI COCKTAIL ~~LOC~~
30.0000 mL | Freq: Once | ORAL | Status: AC
  Administered 2017-08-30: 30 mL via ORAL
  Filled 2017-08-30: qty 30

## 2017-08-30 MED ORDER — ONDANSETRON HCL 4 MG/2ML IJ SOLN
4.0000 mg | Freq: Once | INTRAMUSCULAR | Status: AC
  Administered 2017-08-30: 4 mg via INTRAVENOUS
  Filled 2017-08-30: qty 2

## 2017-08-30 MED ORDER — NITROGLYCERIN 0.4 MG SL SUBL
0.4000 mg | SUBLINGUAL_TABLET | SUBLINGUAL | Status: DC | PRN
  Filled 2017-08-30: qty 1

## 2017-08-30 MED ORDER — FENTANYL CITRATE (PF) 100 MCG/2ML IJ SOLN
50.0000 ug | Freq: Once | INTRAMUSCULAR | Status: AC
  Administered 2017-08-30: 50 ug via INTRAVENOUS
  Filled 2017-08-30: qty 2

## 2017-08-30 MED ORDER — ASPIRIN 81 MG PO CHEW
324.0000 mg | CHEWABLE_TABLET | Freq: Once | ORAL | Status: AC
  Administered 2017-08-30: 324 mg via ORAL
  Filled 2017-08-30: qty 4

## 2017-08-30 MED ORDER — SODIUM CHLORIDE 0.9 % IV BOLUS
1000.0000 mL | Freq: Once | INTRAVENOUS | Status: AC
  Administered 2017-08-30: 1000 mL via INTRAVENOUS

## 2017-08-30 NOTE — ED Notes (Signed)
Pt c/o of intermittent heavy central chest pain radiating to right arm and shoulder. Pt has had reflux x1week.

## 2017-08-30 NOTE — ED Notes (Signed)
Pt refusing nitro tablets at this time. Pt advised of recommendation of the positive affect of them and their benefit. Pt still states she doesn't want them and will take the aspirin and see what happens. Pt also states she doesn't want a headache from it because she already has one now.  Will inform MD Sharma CovertNorman

## 2017-08-30 NOTE — ED Triage Notes (Signed)
sayis pain under right arm and right side chest and it goes to central upper chest.  Says she couldn't sleep last night with indigestiona nd heartburn.  Was okay today and then suddenly she couldn't breath and chest was hurting.  Got clammy a that time

## 2017-08-30 NOTE — ED Provider Notes (Addendum)
[facility] Emergency Department Provider Note  ____________________________________________  Time seen: Approximately 5:50 PM  I have reviewed the triage vital signs and the nursing notes.   HISTORY  Chief Complaint Arm Pain and Chest Pain    HPI Caitlyn Fuller is a 42 y.o. female a history of HTN, DM, morbid obesity, presenting for chest pain.  The patient reports that this morning, she was watching television when she had acute onset of a sharp central and right-sided chest pain which radiated down the right arm and had associated right arm numbness.  Pain is not pleuritic.  This pain has been intermittent throughout the day, "like labor contractions."  She does also have associated shortness of breath and was feeling sweaty earlier; no nausea or vomiting.  No lower extremity swelling or calf pain.  She states that sitting forward makes her feel better.  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  FH: father died age 16 " after heart and lung transplant because of a hole in his heart"; remainder of the family history is pertinent for cancer but no early CAD.  Past Medical History:  Diagnosis Date  . Asthma   . Diabetes mellitus without complication (HCC)   . Hernia   . Hypertension   . Kidney infection   . Shingles     Patient Active Problem List   Diagnosis Date Noted  . Hyperglycemia without ketosis   . Chronic pain   . Herpes zoster   . Renal mass, left   . Essential hypertension   . Hyperglycemia 11/01/2014    Past Surgical History:  Procedure Laterality Date  . ABDOMINAL SURGERY    . CESAREAN SECTION    . CHOLECYSTECTOMY    . colonscopy    . TUBAL LIGATION      Current Outpatient Rx  . Order #: [id number] Class: Print  .  Order #: [id number] Class: Print  . Order #: [id number] Class: Print  . Order #: [id number] Class: Print  . Order #: [id number] Class: Print  . Order #: [id number] Class: Print  . Order #: [id number] Class: Print  . Order #: [id number] Class: Print  . Order #: [id number] Class: Normal  . Order #: [id number] Class: Print  . Order #: [id number] Class: Historical Med  . Order #: [id number] Class: Print  . Order #: [id number] Class: Print  . Order #: [id number] Class: Print  . Order #: [id number] Class: Print  . Order #: [id number] Class: Print    Allergies Gabapentin; Morphine and related; Darvocet [propoxyphene n-acetaminophen]; Toradol [ketorolac tromethamine]; Ultram [tramadol]; and Vancomycin  No family history on file.  Social History Social History   Tobacco Use  . Smoking status: Current Every Day Smoker    Packs/day: 0.50    Types: Cigarettes  . Smokeless tobacco: Never Used  Substance Use Topics  . Alcohol use: No  . Drug use: No    Review of Systems Constitutional: No fever/chills.  No lightheadedness or syncope.  Positive diaphoresis. Eyes: No visual changes. ENT: No sore throat. No congestion or rhinorrhea. Cardiovascular: Positive chest pain. Denies palpitations. Respiratory: Positive shortness of breath.  No cough. Gastrointestinal: No abdominal pain.  No nausea, no vomiting.  No diarrhea.  No constipation. Genitourinary: Negative for dysuria. Musculoskeletal: Negative for back pain.  No lower extremity swelling or calf pain.  Skin: Negative for rash. Neurological: Negative for headaches. No focal numbness, tingling or weakness.  Psychiatric:Positive significant anxiety and stress.   ____________________________________________   PHYSICAL EXAM:  VITAL SIGNS: ED Triage Vitals  Enc Vitals Group     BP 08/30/17 1600 (!) 153/76     Pulse Rate 08/30/17 1600 83     Resp 08/30/17 1600 16     Temp 08/30/17 1600 98.3 F (36.8 C)     Temp Source 08/30/17 1600 Oral     SpO2 08/30/17  1600 98 %     Weight 08/30/17 1603 266 lb 1.5 oz (120.7 kg)     Height 08/30/17 1603 5' (1.524 m)     Head Circumference --      Peak Flow --      Pain Score 08/30/17 1603 10     Pain Loc --      Pain Edu? --      Excl. in GC? --     Constitutional: Alert and oriented.  Answers questions appropriately.  The patient is tearful, holding her chest, but she can be reassured verbally. Eyes: Conjunctivae are normal.  EOMI. No scleral icterus. Head: Atraumatic. Nose: No congestion/rhinnorhea. Mouth/Throat: Mucous membranes are moist.  Neck: No stridor.  Supple.  No JVD.  No meningismus. Cardiovascular: Normal rate, regular rhythm. No murmurs, rubs or gallops.  Respiratory: Normal respiratory effort.  No accessory muscle use or retractions. Lungs CTAB.  No wheezes, rales or ronchi. Gastrointestinal: Soft, nontender and nondistended.  No guarding or rebound.  No peritoneal signs. Musculoskeletal: No LE edema. No ttp in the calves or palpable cords.  Negative Homan's sign. Neurologic:  A&Ox3.  Speech is clear.  Face and smile are symmetric.  EOMI.  Moves all extremities well. Skin:  Skin is warm, dry and intact. No rash noted. Psychiatric: Patient has a depressed mood and anxious affect.  ____________________________________________   LABS (all labs ordered are listed, but only abnormal results are displayed)  Labs Reviewed  BASIC METABOLIC PANEL - Abnormal; Notable for the following components:      Result Value   Sodium 131 (*)    Chloride 92 (*)    Glucose, Bld 423 (*)    Calcium 8.8 (*)    All other components within normal limits  CBC - Abnormal; Notable for the following components:   WBC 11.9 (*)    RBC 5.65 (*)    MCV 79.1 (*)    RDW 16.3 (*)    All other components within normal limits  GLUCOSE, CAPILLARY - Abnormal; Notable for the following components:   Glucose-Capillary 417 (*)    All other components within normal limits  GLUCOSE, CAPILLARY - Abnormal; Notable for  the following components:   Glucose-Capillary 321 (*)    All other components within normal limits  GLUCOSE, CAPILLARY - Abnormal; Notable for the following components:   Glucose-Capillary 446 (*)    All other components within normal limits  TROPONIN I  TROPONIN I  CBG MONITORING, ED  POC URINE PREG, ED   ____________________________________________  EKG  ED ECG REPORT I, Anne-Caroline Sharma Covert, the attending physician, personally viewed and interpreted this ECG.   Date: 08/30/2017  EKG Time: 1533  Rate: 87  Rhythm: normal sinus rhythm  Axis: leftward  Intervals:none  ST&T Change: No STEMI; nonspecific T wave inversion in V1.  Poor baseline tracing.  A repeat EKG has been ordered.   Repeat EKG during episode of chest pain: ED ECG REPORT I, Anne-Caroline Sharma Covert,  the attending physician, personally viewed and interpreted this ECG.   Date: 08/30/2017  EKG Time: 1759  Rate: 82  Rhythm: normal sinus rhythm  Axis: leftward  Intervals:none  ST&T Change: No STEMI; poor baseline tracing  Repeat EKG: ED ECG REPORT I, Anne-Caroline Sharma Covert, the attending physician, personally viewed and interpreted this ECG.   Date: 08/30/2017  EKG Time: 2036  Rate: 85  Rhythm: normal sinus rhythm  Axis: none  Intervals:none  ST&T Change: no STEMI   ____________________________________________  RADIOLOGY  Dg Chest 2 View  Result Date: 08/30/2017 CLINICAL DATA:  Right chest and arm pain. EXAM: CHEST - 2 VIEW COMPARISON:  08/01/2017 FINDINGS: Lungs are hypoinflated without focal airspace consolidation or effusion. Cardiomediastinal silhouette is within normal. There are mild degenerative changes of the spine. IMPRESSION: Hypoinflation without acute cardiopulmonary disease. Electronically Signed   By: Elberta Fortis M.D.   On: 08/30/2017 16:30    ____________________________________________   PROCEDURES  Procedure(s) performed: None  Procedures  Critical Care performed:  No ____________________________________________   INITIAL IMPRESSION / ASSESSMENT AND PLAN / ED COURSE  Pertinent labs & imaging results that were available during my care of the patient were reviewed by me and considered in my medical decision making (see chart for details).  42 y.o. female with a history of HTN and DM presenting for chest pain.  Overall, the patient is mildly hypertensive at 153/76.  I do not see any ischemic changes on her EKG but we will get a repeat given that she is having an active episode of chest pain at this time.  The patient will be treated with nitroglycerin, aspirin, as well as Ativan for her anxiety.  Overall, the patient does have multiple cardiac risk factors and has not had any kind of her stratification study in the past.  She has a reassuring troponin, and a chest x-ray which shows hyperinflation but otherwise no active cardiopulmonary disease.  The remainder of her laboratory studies are also reassuring.  We will repeat her troponin, and continue to monitor her symptoms and work on symptom medic treatment.  Plan reevaluation for final disposition.  ----------------------------------------- 8:23 PM on 08/30/2017 -----------------------------------------  The patient's work-up in the emergency department has been reassuring.  Her EKG does not show any ischemic changes and her troponin has been negative x2.  She has some mild hypertension but otherwise continues to remain hemodynamically stable.  At this time, the patient states that her pain has improved, but that she still needs to "press down on my chest to make it feel better."  I hurt offered her nitroglycerin, and she refuses to take this "because it killed my mother."  I have offered her trial with a GI cocktail, which she has also refused.  We will give her a dose of fentanyl.  At this time, I do not see any evidence for an acute emergency illness.  I have talked to the patient about her results, as well as  the importance of following up both with her primary care physician and with the cardiologist on call for risk stratification study as she has never had one in the past and has multiple cardiac risk factors.  Follow-up instructions as well as return precautions were discussed.  Plan discharge.  ----------------------------------------- 9:40 PM on 08/30/2017 -----------------------------------------  The patient reported that fentanyl did not help her pain.  She then took a GI cocktail, which she said improved her symptoms.  At this time, she has ambulated to the bathroom and is  sitting up in bed doing puzzles and talking with her boyfriend without any difficulty.  We will plan to discharge her home and have her follow-up closely.   ____________________________________________  FINAL CLINICAL IMPRESSION(S) / ED DIAGNOSES  Final diagnoses:  Chest pain, unspecified type  Shortness of breath         NEW MEDICATIONS STARTED DURING THIS VISIT:  New Prescriptions   No medications on file      Rockne MenghiniNorman, Anne-Caroline, MD 08/30/17 1759    Rockne MenghiniNorman, Anne-Caroline, MD 08/30/17 2150

## 2017-08-30 NOTE — Discharge Instructions (Signed)
Patient plenty of fluid to stay well-hydrated.  Please eat a bland diet.  Please make an appointment with your primary care physician, as well as the cardiologist on-call, Dr. Lady GaryFath.  Return to the emergency department if you develop severe pain, lightheadedness or fainting, shortness of breath, fever, or any other symptoms concerning to you.

## 2017-11-11 ENCOUNTER — Encounter: Payer: Self-pay | Admitting: *Deleted

## 2017-11-11 ENCOUNTER — Emergency Department: Payer: Self-pay

## 2017-11-11 ENCOUNTER — Other Ambulatory Visit: Payer: Self-pay

## 2017-11-11 ENCOUNTER — Emergency Department
Admission: EM | Admit: 2017-11-11 | Discharge: 2017-11-11 | Disposition: A | Discharge status: Home / Self Care | Payer: Self-pay | Attending: Emergency Medicine | Admitting: Emergency Medicine

## 2017-11-11 DIAGNOSIS — Z5321 Procedure and treatment not carried out due to patient leaving prior to being seen by health care provider: Secondary | ICD-10-CM | POA: Insufficient documentation

## 2017-11-11 DIAGNOSIS — R079 Chest pain, unspecified: Secondary | ICD-10-CM | POA: Insufficient documentation

## 2017-11-11 DIAGNOSIS — R0602 Shortness of breath: Secondary | ICD-10-CM | POA: Insufficient documentation

## 2017-11-11 LAB — BASIC METABOLIC PANEL
ANION GAP: 6 (ref 5–15)
BUN: 15 mg/dL (ref 6–20)
CHLORIDE: 98 mmol/L (ref 98–111)
CO2: 28 mmol/L (ref 22–32)
Calcium: 8.3 mg/dL — ABNORMAL LOW (ref 8.9–10.3)
Creatinine, Ser: 1 mg/dL (ref 0.44–1.00)
GFR calc Af Amer: >60 mL/min (ref >60)
GFR calc non Af Amer: >60 mL/min (ref >60)
Glucose, Bld: 419 mg/dL — ABNORMAL HIGH (ref 70–99)
POTASSIUM: 4.1 mmol/L (ref 3.5–5.1)
Sodium: 132 mmol/L — ABNORMAL LOW (ref 135–145)

## 2017-11-11 LAB — TROPONIN I

## 2017-11-11 LAB — CBC
HEMATOCRIT: 42.9 % (ref 36.0–46.0)
HEMOGLOBIN: 13.8 g/dL (ref 12.0–15.0)
MCH: 26 pg (ref 26.0–34.0)
MCHC: 32.2 g/dL (ref 30.0–36.0)
MCV: 80.8 fL (ref 80.0–100.0)
Platelets: 228 10*3/uL (ref 150–400)
RBC: 5.31 MIL/uL — AB (ref 3.87–5.11)
RDW: 13.8 % (ref 11.5–15.5)
WBC: 11 10*3/uL — AB (ref 4.0–10.5)
nRBC: 0 % (ref 0.0–0.2)

## 2017-11-11 MED ORDER — ONDANSETRON 4 MG PO TBDP: 4.0000 mg | ORAL_TABLET | Freq: Once | ORAL | Status: DC

## 2017-11-11 MED ORDER — ALBUTEROL SULFATE (2.5 MG/3ML) 0.083% IN NEBU: 5.0000 mg | INHALATION_SOLUTION | Freq: Once | RESPIRATORY_TRACT | Status: DC

## 2017-11-11 MED ORDER — ALBUTEROL SULFATE (2.5 MG/3ML) 0.083% IN NEBU
INHALATION_SOLUTION | RESPIRATORY_TRACT | Status: AC
  Filled 2017-11-11: qty 6

## 2017-11-11 MED ORDER — ONDANSETRON 4 MG PO TBDP
ORAL_TABLET | ORAL | Status: AC
  Filled 2017-11-11: qty 1

## 2017-11-11 NOTE — ED Triage Notes (Signed)
Pt to ED with multiple medical complaints. Pt reporting chest pain, rib pain, cough with congestion, sore throat with difficulty breathing and headache. Pt is able to speak in complete sentences. Increased WOB noted in triage. Chest is tender upon palpation  Pt also reports shingles to her right leg that has been worsening lately and reports treatment in September has not worked.

## 2017-11-11 NOTE — ED Notes (Signed)
Patient observed coming into the lobby from outside.

## 2017-11-12 ENCOUNTER — Other Ambulatory Visit: Payer: Self-pay

## 2017-11-12 ENCOUNTER — Emergency Department (HOSPITAL_COMMUNITY)
Admission: EM | Admit: 2017-11-12 | Discharge: 2017-11-12 | Disposition: A | Discharge status: Home / Self Care | Payer: Self-pay | Attending: Emergency Medicine | Admitting: Emergency Medicine

## 2017-11-12 DIAGNOSIS — Z5321 Procedure and treatment not carried out due to patient leaving prior to being seen by health care provider: Secondary | ICD-10-CM | POA: Insufficient documentation

## 2017-11-12 DIAGNOSIS — R05 Cough: Secondary | ICD-10-CM | POA: Insufficient documentation

## 2017-11-12 LAB — BASIC METABOLIC PANEL
Anion gap: 10 (ref 5–15)
BUN: 14 mg/dL (ref 6–20)
CO2: 25 mmol/L (ref 22–32)
CREATININE: 0.8 mg/dL (ref 0.44–1.00)
Calcium: 8.4 mg/dL — ABNORMAL LOW (ref 8.9–10.3)
Chloride: 94 mmol/L — ABNORMAL LOW (ref 98–111)
GFR calc Af Amer: >60 mL/min (ref >60)
GFR calc non Af Amer: >60 mL/min (ref >60)
GLUCOSE: 593 mg/dL — AB (ref 70–99)
Potassium: 4 mmol/L (ref 3.5–5.1)
Sodium: 129 mmol/L — ABNORMAL LOW (ref 135–145)

## 2017-11-12 LAB — I-STAT TROPONIN, ED: Troponin i, poc: 0 ng/mL (ref 0.00–0.08)

## 2017-11-12 LAB — CBC
HEMATOCRIT: 45.1 % (ref 36.0–46.0)
Hemoglobin: 13.6 g/dL (ref 12.0–15.0)
MCH: 25.2 pg — AB (ref 26.0–34.0)
MCHC: 30.2 g/dL (ref 30.0–36.0)
MCV: 83.5 fL (ref 80.0–100.0)
Platelets: 248 10*3/uL (ref 150–400)
RBC: 5.4 MIL/uL — ABNORMAL HIGH (ref 3.87–5.11)
RDW: 13.8 % (ref 11.5–15.5)
WBC: 13.1 10*3/uL — ABNORMAL HIGH (ref 4.0–10.5)
nRBC: 0 % (ref 0.0–0.2)

## 2017-11-12 LAB — I-STAT BETA HCG BLOOD, ED (MC, WL, AP ONLY)

## 2017-11-12 NOTE — ED Notes (Signed)
Pt in lobby becoming verbally aggressive with staff. Pt states she wants her IV fixed... IV site intact, another piece of tape was placed over the existing tegaderm. Pt is not happy with this. Pt is also mad that I sent her to triage even though she came by EMS. Pt is in lobby harassing every staff member that walks by

## 2017-11-12 NOTE — ED Notes (Signed)
Provided pt with paper scrubs ?

## 2017-11-12 NOTE — ED Triage Notes (Addendum)
Patient c/o cough and continuously coughs up phlegm. Was seen at [facility] a few hours ago for same. Had CXR and neb tx but left AMA. States that she has had a cough x 4 days. Patient is audibly congested.

## 2017-11-12 NOTE — ED Notes (Signed)
Pt requesting her IV be taken out so she can leave. Pt states "Im tired of being disrespected everywhere we go, pt and her SO is lobby cussing at staff"

## 2017-11-12 NOTE — ED Notes (Addendum)
Patient was upset that she was placed in the lobby and complained to phlebotomy that she was placed in the lobby because the nurse (the nurse writing this note) that she did not like her. Patient advised that no rooms available in the back, but that her care was started in triage and as soon as a room became available she would be moved. In the lobby patient began to complain and say "I feel bad, this is why I left [facility] because of the wait". Patient reassured that staff was in lobby and would keep and eye on her. Patient requested to use the phone and this nurse wheeled pt to the phone. This nurse also gave report to [facility] and tech to make sure that they were watching patient in case she needed assistance.

## 2017-11-12 NOTE — ED Notes (Signed)
Received "critical glucose" of 593 after pt LWBS. Pt was seen drinking [facility] in the waiting room.

## 2017-11-12 NOTE — ED Notes (Signed)
Pt states "my aunt works here, you all will be hearing about this"

## 2017-12-20 ENCOUNTER — Emergency Department
Admission: EM | Admit: 2017-12-20 | Discharge: 2017-12-20 | Disposition: A | Discharge status: Home / Self Care | Payer: Self-pay | Attending: Emergency Medicine | Admitting: Emergency Medicine

## 2017-12-20 ENCOUNTER — Other Ambulatory Visit: Payer: Self-pay

## 2017-12-20 DIAGNOSIS — R358 Other polyuria: Secondary | ICD-10-CM | POA: Insufficient documentation

## 2017-12-20 DIAGNOSIS — F17210 Nicotine dependence, cigarettes, uncomplicated: Secondary | ICD-10-CM | POA: Insufficient documentation

## 2017-12-20 DIAGNOSIS — R079 Chest pain, unspecified: Secondary | ICD-10-CM | POA: Insufficient documentation

## 2017-12-20 DIAGNOSIS — I10 Essential (primary) hypertension: Secondary | ICD-10-CM | POA: Insufficient documentation

## 2017-12-20 DIAGNOSIS — R739 Hyperglycemia, unspecified: Secondary | ICD-10-CM

## 2017-12-20 DIAGNOSIS — Z794 Long term (current) use of insulin: Secondary | ICD-10-CM | POA: Insufficient documentation

## 2017-12-20 DIAGNOSIS — Z79899 Other long term (current) drug therapy: Secondary | ICD-10-CM | POA: Insufficient documentation

## 2017-12-20 DIAGNOSIS — E1165 Type 2 diabetes mellitus with hyperglycemia: Secondary | ICD-10-CM | POA: Insufficient documentation

## 2017-12-20 DIAGNOSIS — J45909 Unspecified asthma, uncomplicated: Secondary | ICD-10-CM | POA: Insufficient documentation

## 2017-12-20 LAB — TROPONIN I
Troponin I: <0.03 ng/mL (ref <0.03)
Troponin I: <0.03 ng/mL (ref <0.03)

## 2017-12-20 LAB — GLUCOSE, CAPILLARY
Glucose-Capillary: 543 mg/dL (ref 70–99)
Glucose-Capillary: 299 mg/dL — ABNORMAL HIGH (ref 70–99)
Glucose-Capillary: 340 mg/dL — ABNORMAL HIGH (ref 70–99)

## 2017-12-20 LAB — URINALYSIS, COMPLETE (UACMP) WITH MICROSCOPIC
BILIRUBIN URINE: NEGATIVE
Bacteria, UA: NONE SEEN
KETONES UR: NEGATIVE mg/dL
LEUKOCYTES UA: NEGATIVE
Nitrite: NEGATIVE
Protein, ur: NEGATIVE mg/dL
Specific Gravity, Urine: 1.027 (ref 1.005–1.030)
pH: 5 (ref 5.0–8.0)

## 2017-12-20 LAB — COMPREHENSIVE METABOLIC PANEL
ALT: 37 U/L (ref 0–44)
ANION GAP: 10 (ref 5–15)
AST: 29 U/L (ref 15–41)
Albumin: 4 g/dL (ref 3.5–5.0)
Alkaline Phosphatase: 91 U/L (ref 38–126)
BUN: 14 mg/dL (ref 6–20)
CHLORIDE: 86 mmol/L — AB (ref 98–111)
CO2: 33 mmol/L — ABNORMAL HIGH (ref 22–32)
Calcium: 9.2 mg/dL (ref 8.9–10.3)
Creatinine, Ser: 0.8 mg/dL (ref 0.44–1.00)
GFR calc non Af Amer: >60 mL/min (ref >60)
Glucose, Bld: 555 mg/dL (ref 70–99)
POTASSIUM: 3.4 mmol/L — AB (ref 3.5–5.1)
SODIUM: 129 mmol/L — AB (ref 135–145)
TOTAL PROTEIN: 7.7 g/dL (ref 6.5–8.1)
Total Bilirubin: 0.6 mg/dL (ref 0.3–1.2)

## 2017-12-20 LAB — DIFFERENTIAL
ABS IMMATURE GRANULOCYTES: 0.05 10*3/uL (ref 0.00–0.07)
Basophils Absolute: 0.1 10*3/uL (ref 0.0–0.1)
Basophils Relative: 1 %
EOS PCT: 2 %
Eosinophils Absolute: 0.2 10*3/uL (ref 0.0–0.5)
IMMATURE GRANULOCYTES: 0 %
LYMPHS ABS: 3.7 10*3/uL (ref 0.7–4.0)
Lymphocytes Relative: 29 %
MONOS PCT: 6 %
Monocytes Absolute: 0.8 10*3/uL (ref 0.1–1.0)
NEUTROS PCT: 62 %
Neutro Abs: 8 10*3/uL — ABNORMAL HIGH (ref 1.7–7.7)

## 2017-12-20 LAB — CBC
HEMATOCRIT: 45.5 % (ref 36.0–46.0)
HEMOGLOBIN: 14.7 g/dL (ref 12.0–15.0)
MCH: 26.3 pg (ref 26.0–34.0)
MCHC: 32.3 g/dL (ref 30.0–36.0)
MCV: 81.4 fL (ref 80.0–100.0)
NRBC: 0 % (ref 0.0–0.2)
PLATELETS: 300 10*3/uL (ref 150–400)
RBC: 5.59 MIL/uL — ABNORMAL HIGH (ref 3.87–5.11)
RDW: 13.7 % (ref 11.5–15.5)
WBC: 12.8 10*3/uL — ABNORMAL HIGH (ref 4.0–10.5)

## 2017-12-20 MED ORDER — INSULIN ASPART 100 UNIT/ML ~~LOC~~ SOLN
8.0000 [IU] | Freq: Once | SUBCUTANEOUS | Status: AC
  Administered 2017-12-20: 8 [IU] via INTRAVENOUS
  Filled 2017-12-20: qty 1

## 2017-12-20 NOTE — ED Provider Notes (Signed)
[facility] Emergency Department Provider Note ____________________________   None    (approximate)  I have reviewed the triage vital signs and the nursing notes.   HISTORY  Chief Complaint Chest Pain    HPI Caitlyn Fuller is a 42 y.o. female with below list of chronic medical conditions including diabetes mellitus presents to the emergency department with hyperglycemia.  Patient admits to polyuria.  Patient denies any nausea vomiting diarrhea constipation.  Patient denies any dysuria or hematuria.  Patient denies any fever.  Patient does admit to generalized pain including chest pain.   Past Medical History:  Diagnosis Date  . Asthma   . Diabetes mellitus without complication (HCC)   . Hernia   . Hypertension   . Kidney infection   . Shingles     Patient Active Problem List   Diagnosis Date Noted  . Hyperglycemia without ketosis   . Chronic pain   . Herpes zoster   . Renal mass, left   . Essential hypertension   . Hyperglycemia 11/01/2014    Past Surgical History:  Procedure Laterality Date  . ABDOMINAL SURGERY    . CESAREAN SECTION    . CHOLECYSTECTOMY    . colonscopy    . TUBAL LIGATION      Prior to Admission medications   Medication Sig Start Date End Date Taking? Authorizing Provider  albuterol (PROVENTIL HFA;VENTOLIN HFA) 108 (90 Base) MCG/ACT inhaler Inhale 2 puffs into the lungs every 6 (six) hours as needed for wheezing or shortness of breath. 01/12/16   Darci Current, MD  albuterol (PROVENTIL) (2.5 MG/3ML) 0.083% nebulizer solution Take 3 mLs (2.5 mg total) by nebulization every 4 (four) hours as needed for wheezing or shortness of breath. 01/12/16   Darci Current, MD  cyclobenzaprine (FLEXERIL) 10 MG tablet Take 1 tablet (10 mg total) 3 (three) times daily as needed by mouth for muscle spasms. 11/16/16   Fayrene Helper, PA-C  dicyclomine (BENTYL) 20 MG tablet Take 1 tablet (20 mg total) by mouth every 6 (six) hours as  needed. 09/21/16   Irean Hong, MD  etodolac (LODINE) 200 MG capsule Take 1 capsule (200 mg total) by mouth every 8 (eight) hours. Patient not taking: Reported on 04/07/2016 03/15/16   Rebecka Apley, MD  famotidine (PEPCID) 40 MG tablet Take 1 tablet (40 mg total) by mouth every evening. 07/25/17 07/25/18  Phineas Semen, MD  furosemide (LASIX) 20 MG tablet Take 1 tablet (20 mg total) by mouth daily. 03/06/16 03/06/17  Minna Antis, MD  HYDROcodone-acetaminophen (NORCO/VICODIN) 5-325 MG tablet Take 1 tablet by mouth every 6 (six) hours as needed for moderate pain. 04/07/16   Lawyer, Cristal Deer, PA-C  Insulin Detemir (LEVEMIR FLEXTOUCH) 100 UNIT/ML Pen Inject 38 Units into the skin every morning AND 28 Units at bedtime. 08/22/17 12/20/17  Dionne Bucy, MD  insulin NPH-regular Human (NOVOLIN 70/30) (70-30) 100 UNIT/ML injection Inject 38-48 Units into the skin 2 (two) times daily with a meal. Inject 48 units under the skin in the morning and 38 units under the skin at night 07/25/17   Phineas Semen, MD  lisinopril (PRINIVIL,ZESTRIL) 10 MG tablet Take 10 mg by mouth daily.    [provider]  naproxen (NAPROSYN) 500 MG tablet Take 1 tablet (500 mg total) by mouth 2 (two) times daily with a meal. Patient not taking: Reported on 04/07/2016 04/26/15   Jene Every, MD  ondansetron (ZOFRAN ODT) 4 MG disintegrating tablet Take 1 tablet (4  mg total) by mouth every 8 (eight) hours as needed for nausea or vomiting. 09/21/16   Irean Hong, MD  oxyCODONE (ROXICODONE) 15 MG immediate release tablet Take 1 tablet (15 mg total) every 8 (eight) hours as needed by mouth for pain. 11/16/16   Fayrene Helper, PA-C  sucralfate (CARAFATE) 1 g tablet Take 1 tablet (1 g total) by mouth 4 (four) times daily. 07/25/17   Phineas Semen, MD    Allergies Gabapentin; Morphine and related; Darvocet [propoxyphene n-acetaminophen]; Toradol [ketorolac tromethamine]; Ultram [tramadol]; and Vancomycin  No family  history on file.  Social History Social History   Tobacco Use  . Smoking status: Current Every Day Smoker    Packs/day: 0.50    Types: Cigarettes  . Smokeless tobacco: Never Used  Substance Use Topics  . Alcohol use: No  . Drug use: No    Review of Systems Constitutional: No fever/chills Eyes: No visual changes. ENT: No sore throat. Cardiovascular: Denies chest pain. Respiratory: Denies shortness of breath. Gastrointestinal: No abdominal pain.  No nausea, no vomiting.  No diarrhea.  No constipation. Genitourinary: Negative for dysuria. Musculoskeletal: Negative for neck pain.  Negative for back pain. Integumentary: Negative for rash. Neurological: Negative for headaches, focal weakness or numbness. Endocrine: Positive for hyperglycemia  ____________________________________________   PHYSICAL EXAM:  VITAL SIGNS: ED Triage Vitals  Enc Vitals Group     BP      Pulse      Resp      Temp      Temp src      SpO2      Weight      Height      Head Circumference      Peak Flow      Pain Score      Pain Loc      Pain Edu?      Excl. in GC?     Constitutional: Alert and oriented. Well appearing and in no acute distress. Eyes: Conjunctivae are normal. Head: Atraumatic. Mouth/Throat: Mucous membranes are moist. Oropharynx non-erythematous. Neck: No stridor. Cardiovascular: Normal rate, regular rhythm. Good peripheral circulation. Grossly normal heart sounds. Respiratory: Normal respiratory effort.  No retractions. Lungs CTAB. Gastrointestinal: Soft and nontender. No distention.  Musculoskeletal: No lower extremity tenderness nor edema. No gross deformities of extremities. Neurologic:  Normal speech and language. No gross focal neurologic deficits are appreciated.  Skin:  Skin is warm, dry and intact. No rash noted. Psychiatric: Mood and affect are normal. Speech and behavior are normal.  ____________________________________________   LABS (all labs ordered are  listed, but only abnormal results are displayed)  Labs Reviewed  URINALYSIS, COMPLETE (UACMP) WITH MICROSCOPIC - Abnormal; Notable for the following components:      Result Value   Color, Urine YELLOW (*)    APPearance CLEAR (*)    Glucose, UA >=500 (*)    Hgb urine dipstick SMALL (*)    All other components within normal limits  GLUCOSE, CAPILLARY - Abnormal; Notable for the following components:   Glucose-Capillary 299 (*)    All other components within normal limits  GLUCOSE, CAPILLARY - Abnormal; Notable for the following components:   Glucose-Capillary 340 (*)    All other components within normal limits  TROPONIN I   ____________________________________________  EKG  ED ECG REPORT I, Fulton N Zurii Hewes, the attending physician, personally viewed and interpreted this ECG.   Date: 12/20/2017  EKG Time: 1:21 AM  Rate: 103  Rhythm: Sinus tachycardia  Axis: Leftward  axis  Intervals: Normal  ST&T Change: None    Procedures   ____________________________________________   INITIAL IMPRESSION / ASSESSMENT AND PLAN / ED COURSE  As part of my medical decision making, I reviewed the following data within the electronic MEDICAL RECORD NUMBER   42 year old female presented with above-stated history and physical exam secondary hyperglycemia and generalized pain.  Laboratory data notable for glucose of 555 with a normal anion gap no ketones present in the patient's urine.  Patient given 2 L IV normal saline as well as 8 units IV insulin with repeat glucose 299.  Patient states that glucose have been high for a long time and that her "doctors are having difficulty controlling my sugar".  Patient referred to primary care provider for further outpatient management of hyperglycemia ____________________________________________  FINAL CLINICAL IMPRESSION(S) / ED DIAGNOSES  Final diagnoses:  Hyperglycemia     MEDICATIONS GIVEN DURING THIS VISIT:  Medications  insulin aspart  (novoLOG) injection 8 Units (8 Units Intravenous Given 12/20/17 0316)     ED Discharge Orders    None       Note:  This document was prepared using Dragon voice recognition software and may include unintentional dictation errors.    Darci CurrentBrown, [place]dd[place] N, MD 12/20/17 [phone]

## 2017-12-20 NOTE — ED Triage Notes (Signed)
See paper chart for downtime charting 

## 2018-02-05 ENCOUNTER — Encounter: Payer: Self-pay | Admitting: Emergency Medicine

## 2018-02-05 ENCOUNTER — Other Ambulatory Visit: Payer: Self-pay

## 2018-02-05 DIAGNOSIS — F17210 Nicotine dependence, cigarettes, uncomplicated: Secondary | ICD-10-CM | POA: Insufficient documentation

## 2018-02-05 DIAGNOSIS — Z79899 Other long term (current) drug therapy: Secondary | ICD-10-CM | POA: Insufficient documentation

## 2018-02-05 DIAGNOSIS — I10 Essential (primary) hypertension: Secondary | ICD-10-CM | POA: Insufficient documentation

## 2018-02-05 DIAGNOSIS — E119 Type 2 diabetes mellitus without complications: Secondary | ICD-10-CM | POA: Insufficient documentation

## 2018-02-05 DIAGNOSIS — A6004 Herpesviral vulvovaginitis: Secondary | ICD-10-CM | POA: Insufficient documentation

## 2018-02-05 DIAGNOSIS — J45909 Unspecified asthma, uncomplicated: Secondary | ICD-10-CM | POA: Insufficient documentation

## 2018-02-05 DIAGNOSIS — Z794 Long term (current) use of insulin: Secondary | ICD-10-CM | POA: Insufficient documentation

## 2018-02-05 LAB — URINALYSIS, COMPLETE (UACMP) WITH MICROSCOPIC
Bacteria, UA: NONE SEEN
Bilirubin Urine: NEGATIVE
Glucose, UA: >=500 mg/dL — AB
Ketones, ur: NEGATIVE mg/dL
NITRITE: NEGATIVE
Protein, ur: NEGATIVE mg/dL
Specific Gravity, Urine: 1.032 — ABNORMAL HIGH (ref 1.005–1.030)
pH: 5 (ref 5.0–8.0)

## 2018-02-05 NOTE — ED Triage Notes (Signed)
Pt arrived to the ED for complaints of painful urination and blood in the urine for the past week. Pt is AOx4 in no apparent distress.

## 2018-02-06 ENCOUNTER — Other Ambulatory Visit: Payer: Self-pay

## 2018-02-06 ENCOUNTER — Emergency Department
Admission: EM | Admit: 2018-02-06 | Discharge: 2018-02-06 | Disposition: A | Discharge status: Home / Self Care | Payer: Self-pay | Attending: Emergency Medicine | Admitting: Emergency Medicine

## 2018-02-06 DIAGNOSIS — A6004 Herpesviral vulvovaginitis: Secondary | ICD-10-CM

## 2018-02-06 MED ORDER — OXYCODONE-ACETAMINOPHEN 5-325 MG PO TABS: 2.0000 | ORAL_TABLET | Freq: Four times a day (QID) | ORAL | 0 refills | Status: DC | PRN

## 2018-02-06 MED ORDER — VALACYCLOVIR HCL 1 G PO TABS: 1000.0000 mg | ORAL_TABLET | Freq: Every day | ORAL | 0 refills | Status: AC

## 2018-02-06 MED ORDER — VALACYCLOVIR HCL 1 G PO TABS: 1000.0000 mg | ORAL_TABLET | Freq: Every day | ORAL | 0 refills | Status: DC

## 2018-02-06 MED ORDER — FLUCONAZOLE 50 MG PO TABS
150.0000 mg | ORAL_TABLET | Freq: Once | ORAL | Status: AC
  Administered 2018-02-06: 150 mg via ORAL
  Filled 2018-02-06: qty 1

## 2018-02-06 MED ORDER — OXYCODONE-ACETAMINOPHEN 5-325 MG PO TABS
2.0000 | ORAL_TABLET | Freq: Once | ORAL | Status: AC
  Administered 2018-02-06: 2 via ORAL
  Filled 2018-02-06 (×2): qty 2

## 2018-02-06 NOTE — ED Provider Notes (Signed)
[facility] Emergency Department Provider Note  ____________________________________________   First MD Initiated Contact with Patient 02/06/18 [phone]     (approximate)  I have reviewed the triage vital signs and the nursing notes.   HISTORY  Chief Complaint Dysuria    HPI Caitlyn Fuller is a 43 y.o. female with extensive prior emergency department visits and chronic medical history including chronic pain as listed below.  She presents for evaluation of about a week of painful urination, some blood in the urine, and extremely tender "genital region".  She reports that she can barely tolerate even wiping.  It feels similar to when she had a herpes outbreak years ago but she has not been able to see any lesions.  She has a variety of assorted complaints all throughout her body but most these are chronic including a chronic rash on her right leg, a knot up higher in her back, etc. the main reason for the visit tonight, however, is the painful "outside" of her genital region and the pain with urination.  She reports the pain is severe and she is currently in tears.  She denies fever, chest pain, shortness of breath, nausea, vomiting, and abdominal pain.  Past Medical History:  Diagnosis Date  . Asthma   . Diabetes mellitus without complication (HCC)   . Hernia   . Hypertension   . Kidney infection   . Shingles     Patient Active Problem List   Diagnosis Date Noted  . Hyperglycemia without ketosis   . Chronic pain   . Herpes zoster   . Renal mass, left   . Essential hypertension   . Hyperglycemia 11/01/2014    Past Surgical History:  Procedure Laterality Date  . ABDOMINAL SURGERY    . CESAREAN SECTION    . CHOLECYSTECTOMY    . colonscopy    . TUBAL LIGATION      Prior to Admission medications   Medication Sig Start Date End Date Taking? Authorizing Provider  albuterol (PROVENTIL HFA;VENTOLIN HFA) 108 (90 Base) MCG/ACT inhaler Inhale 2 puffs  into the lungs every 6 (six) hours as needed for wheezing or shortness of breath. 01/12/16   Darci CurrentBrown, Bowers N, MD  albuterol (PROVENTIL) (2.5 MG/3ML) 0.083% nebulizer solution Take 3 mLs (2.5 mg total) by nebulization every 4 (four) hours as needed for wheezing or shortness of breath. 01/12/16   Darci CurrentBrown, Colver N, MD  cyclobenzaprine (FLEXERIL) 10 MG tablet Take 1 tablet (10 mg total) 3 (three) times daily as needed by mouth for muscle spasms. 11/16/16   Fayrene Helperran, Bowie, PA-C  dicyclomine (BENTYL) 20 MG tablet Take 1 tablet (20 mg total) by mouth every 6 (six) hours as needed. 09/21/16   Irean HongSung, Jade J, MD  etodolac (LODINE) 200 MG capsule Take 1 capsule (200 mg total) by mouth every 8 (eight) hours. Patient not taking: Reported on 04/07/2016 03/15/16   Rebecka ApleyWebster, Allison P, MD  famotidine (PEPCID) 40 MG tablet Take 1 tablet (40 mg total) by mouth every evening. 07/25/17 07/25/18  Phineas SemenGoodman, Graydon, MD  furosemide (LASIX) 20 MG tablet Take 1 tablet (20 mg total) by mouth daily. 03/06/16 03/06/17  Minna AntisPaduchowski, Kevin, MD  HYDROcodone-acetaminophen (NORCO/VICODIN) 5-325 MG tablet Take 1 tablet by mouth every 6 (six) hours as needed for moderate pain. 04/07/16   Lawyer, Cristal Deerhristopher, PA-C  Insulin Detemir (LEVEMIR FLEXTOUCH) 100 UNIT/ML Pen Inject 38 Units into the skin every morning AND 28 Units at bedtime. 08/22/17 12/20/17  Dionne BucySiadecki, Sebastian, MD  insulin  NPH-regular Human (NOVOLIN 70/30) (70-30) 100 UNIT/ML injection Inject 38-48 Units into the skin 2 (two) times daily with a meal. Inject 48 units under the skin in the morning and 38 units under the skin at night 07/25/17   Phineas SemenGoodman, Graydon, MD  lisinopril (PRINIVIL,ZESTRIL) 10 MG tablet Take 10 mg by mouth daily.    [provider]  naproxen (NAPROSYN) 500 MG tablet Take 1 tablet (500 mg total) by mouth 2 (two) times daily with a meal. Patient not taking: Reported on 04/07/2016 04/26/15   Jene EveryKinner, Robert, MD  ondansetron (ZOFRAN ODT) 4 MG disintegrating tablet Take 1  tablet (4 mg total) by mouth every 8 (eight) hours as needed for nausea or vomiting. 09/21/16   Irean HongSung, Jade J, MD  oxyCODONE (ROXICODONE) 15 MG immediate release tablet Take 1 tablet (15 mg total) every 8 (eight) hours as needed by mouth for pain. 11/16/16   Fayrene Helperran, Bowie, PA-C  oxyCODONE-acetaminophen (PERCOCET) 5-325 MG tablet Take 2 tablets by mouth every 6 (six) hours as needed for severe pain. 02/06/18   Loleta RoseForbach, Travion Ke, MD  sucralfate (CARAFATE) 1 g tablet Take 1 tablet (1 g total) by mouth 4 (four) times daily. 07/25/17   Phineas SemenGoodman, Graydon, MD  valACYclovir (VALTREX) 1000 MG tablet Take 1 tablet (1,000 mg total) by mouth daily for 5 days. 02/06/18 02/11/18  Loleta RoseForbach, Maeve Debord, MD    Allergies Gabapentin; Morphine and related; Darvocet [propoxyphene n-acetaminophen]; Toradol [ketorolac tromethamine]; Ultram [tramadol]; and Vancomycin  History reviewed. No pertinent family history.  Social History Social History   Tobacco Use  . Smoking status: Current Every Day Smoker    Packs/day: 0.50    Types: Cigarettes  . Smokeless tobacco: Never Used  Substance Use Topics  . Alcohol use: No  . Drug use: No    Review of Systems Constitutional: No fever/chills Cardiovascular: Denies chest pain. Respiratory: Denies shortness of breath. Gastrointestinal: No abdominal pain.  No nausea, no vomiting.  No diarrhea.  No constipation. Genitourinary: Painful genitalia to even light touch, dysuria, possible hematuria. Musculoskeletal: Negative for neck pain.  Negative for back pain. Integumentary: Negative for rash. Neurological: Negative for headaches, focal weakness or numbness.   ____________________________________________   PHYSICAL EXAM:  VITAL SIGNS: ED Triage Vitals  Enc Vitals Group     BP 02/05/18 2244 (!) 189/111     Pulse Rate 02/05/18 2244 (!) 116     Resp 02/05/18 2244 18     Temp 02/05/18 2244 98.4 F (36.9 C)     Temp Source 02/05/18 2244 Oral     SpO2 02/05/18 2244 98 %     Weight  02/05/18 2248 116.1 kg (256 lb)     Height 02/05/18 2248 1.473 m (4\' 10" )     Head Circumference --      Peak Flow --      Pain Score 02/05/18 2248 10     Pain Loc --      Pain Edu? --      Excl. in GC? --     Constitutional: Alert and oriented.  Tearful and appears to be in pain. Eyes: Conjunctivae are normal.  Head: Atraumatic. Cardiovascular: Normal rate, regular rhythm. Good peripheral circulation. Respiratory: Normal respiratory effort.  No retractions.  Gastrointestinal: Soft and nontender. No distention.  Genitourinary: Extensive vulvovaginal herpetic lesions and what appears to be almost desquamation but these are consistent with a recurrence of genital herpes.  The patient could not tolerate a speculum exam secondary to the pain.  There is some  whitish discharge consistent with possible candidal infection as well.  ED chaperone present throughout exam. Musculoskeletal: No lower extremity tenderness nor edema. No gross deformities of extremities. Neurologic:  Normal speech and language. No gross focal neurologic deficits are appreciated.  Skin: Chronic skin rash on the right lower extremity that appears most consistent with psoriasis.  Appears like she may have had a prior superficial infection but it appears to be healing. Psychiatric: Mood and affect are tearful and upset but generally normal.  ____________________________________________   LABS (all labs ordered are listed, but only abnormal results are displayed)  Labs Reviewed  URINALYSIS, COMPLETE (UACMP) WITH MICROSCOPIC - Abnormal; Notable for the following components:      Result Value   Color, Urine STRAW (*)    APPearance CLEAR (*)    Specific Gravity, Urine 1.032 (*)    Glucose, UA >=500 (*)    Hgb urine dipstick SMALL (*)    Leukocytes, UA TRACE (*)    All other components within normal limits  URINE CULTURE   ____________________________________________  EKG  No indication for  EKG ____________________________________________  RADIOLOGY   ED MD interpretation: No indication for imaging  Official radiology report(s): No results found.  ____________________________________________   PROCEDURES  Critical Care performed: No   Procedure(s) performed:   Procedures   ____________________________________________   INITIAL IMPRESSION / ASSESSMENT AND PLAN / ED COURSE  As part of my medical decision making, I reviewed the following data within the electronic MEDICAL RECORD NUMBER Nursing notes reviewed and incorporated, Labs reviewed , Old chart reviewed, Notes from prior ED visits and [place] Controlled Substance Database    Differential includes UTI, STD, genital herpes, candidal infection.  The patient could not tolerate a speculum exam but she has obvious herpetic lesions primarily on the labia minora.  I am treating her with a one-time dose of fluconazole 150 mg p.o. in case of and possibly to prevent a candidal infection but I am also treating her with Valtrex 1 g by mouth daily for 5 days for treating a recurrence.  I encouraged her to read through the included information and to follow-up with GYN.  She says that she understands and agrees.  Urinalysis shows no sign of infection.  Clinical Course as of Feb 06 214  Wed Feb 06, 2018  0142 I checked the [place] controlled substance database and although she has had quite a few prescriptions in the past, she has no prescriptions listed for the last 5 months.  I am giving her 2 Percocet currently due to her acute pain.   [CF]    Clinical Course User Index [CF] Loleta Rose, MD    ____________________________________________  FINAL CLINICAL IMPRESSION(S) / ED DIAGNOSES  Final diagnoses:  Herpes simplex vulvovaginitis     MEDICATIONS GIVEN DURING THIS VISIT:  Medications  oxyCODONE-acetaminophen (PERCOCET/ROXICET) 5-325 MG per tablet 2 tablet ( Oral Not Given 02/06/18 0154)  fluconazole  (DIFLUCAN) tablet 150 mg (has no administration in time range)     ED Discharge Orders         Ordered    valACYclovir (VALTREX) 1000 MG tablet  Daily     02/06/18 0213    oxyCODONE-acetaminophen (PERCOCET) 5-325 MG tablet  Every 6 hours PRN     02/06/18 0213           Note:  This document was prepared using Dragon voice recognition software and may include unintentional dictation errors.   Loleta Rose, MD 02/06/18 [phone]

## 2018-02-08 LAB — URINE CULTURE
Culture: 1000 — AB
Special Requests: NORMAL

## 2018-05-11 ENCOUNTER — Other Ambulatory Visit: Payer: Self-pay

## 2018-05-11 DIAGNOSIS — E1165 Type 2 diabetes mellitus with hyperglycemia: Secondary | ICD-10-CM | POA: Diagnosis not present

## 2018-05-11 DIAGNOSIS — F17210 Nicotine dependence, cigarettes, uncomplicated: Secondary | ICD-10-CM | POA: Insufficient documentation

## 2018-05-11 DIAGNOSIS — A6004 Herpesviral vulvovaginitis: Secondary | ICD-10-CM | POA: Diagnosis not present

## 2018-05-11 DIAGNOSIS — Z79899 Other long term (current) drug therapy: Secondary | ICD-10-CM | POA: Diagnosis not present

## 2018-05-11 DIAGNOSIS — I10 Essential (primary) hypertension: Secondary | ICD-10-CM | POA: Diagnosis not present

## 2018-05-11 LAB — GLUCOSE, CAPILLARY: Glucose-Capillary: 425 mg/dL — ABNORMAL HIGH (ref 70–99)

## 2018-05-11 NOTE — ED Triage Notes (Signed)
Patient reports blood sugar at home was elevated as high.  States she is out of her levimir insulin for the past 3-4 days.

## 2018-05-12 ENCOUNTER — Emergency Department
Admission: EM | Admit: 2018-05-12 | Discharge: 2018-05-12 | Disposition: A | Discharge status: Home / Self Care | Payer: Medicaid Other | Attending: Student in an Organized Health Care Education/Training Program | Admitting: Student in an Organized Health Care Education/Training Program

## 2018-05-12 DIAGNOSIS — R739 Hyperglycemia, unspecified: Secondary | ICD-10-CM

## 2018-05-12 LAB — CBC
HCT: 43.9 % (ref 36.0–46.0)
Hemoglobin: 14.1 g/dL (ref 12.0–15.0)
MCH: 25.9 pg — ABNORMAL LOW (ref 26.0–34.0)
MCHC: 32.1 g/dL (ref 30.0–36.0)
MCV: 80.6 fL (ref 80.0–100.0)
Platelets: 267 10*3/uL (ref 150–400)
RBC: 5.45 MIL/uL — ABNORMAL HIGH (ref 3.87–5.11)
RDW: 14.3 % (ref 11.5–15.5)
WBC: 14 10*3/uL — ABNORMAL HIGH (ref 4.0–10.5)
nRBC: 0 % (ref 0.0–0.2)

## 2018-05-12 LAB — BASIC METABOLIC PANEL
Anion gap: 8 (ref 5–15)
BUN: 13 mg/dL (ref 6–20)
CO2: 28 mmol/L (ref 22–32)
Calcium: 8.6 mg/dL — ABNORMAL LOW (ref 8.9–10.3)
Chloride: 95 mmol/L — ABNORMAL LOW (ref 98–111)
Creatinine, Ser: 0.62 mg/dL (ref 0.44–1.00)
GFR calc Af Amer: >60 mL/min (ref >60)
GFR calc non Af Amer: >60 mL/min (ref >60)
Glucose, Bld: 411 mg/dL — ABNORMAL HIGH (ref 70–99)
Potassium: 4.2 mmol/L (ref 3.5–5.1)
Sodium: 131 mmol/L — ABNORMAL LOW (ref 135–145)

## 2018-05-12 LAB — URINALYSIS, COMPLETE (UACMP) WITH MICROSCOPIC
Bacteria, UA: NONE SEEN
Bilirubin Urine: NEGATIVE
Glucose, UA: >=500 mg/dL — AB
Ketones, ur: NEGATIVE mg/dL
Leukocytes,Ua: NEGATIVE
Nitrite: NEGATIVE
Protein, ur: NEGATIVE mg/dL
Specific Gravity, Urine: 1.037 — ABNORMAL HIGH (ref 1.005–1.030)
pH: 5 (ref 5.0–8.0)

## 2018-05-12 LAB — POCT PREGNANCY, URINE: Preg Test, Ur: NEGATIVE

## 2018-05-12 MED ORDER — VALACYCLOVIR HCL 1 G PO TABS: 1000.0000 mg | ORAL_TABLET | Freq: Every day | ORAL | 0 refills | Status: AC

## 2018-05-12 MED ORDER — INSULIN DETEMIR 100 UNIT/ML FLEXPEN: PEN_INJECTOR | SUBCUTANEOUS | 3 refills | Status: DC

## 2018-05-12 MED ORDER — VALACYCLOVIR HCL 500 MG PO TABS
1000.0000 mg | ORAL_TABLET | Freq: Every day | ORAL | Status: DC
  Administered 2018-05-12: 1000 mg via ORAL
  Filled 2018-05-12: qty 2

## 2018-05-12 MED ORDER — ACETAMINOPHEN 500 MG PO TABS
1000.0000 mg | ORAL_TABLET | Freq: Once | ORAL | Status: AC
  Administered 2018-05-12: 1000 mg via ORAL
  Filled 2018-05-12: qty 2

## 2018-05-12 MED ORDER — INSULIN DETEMIR 100 UNIT/ML ~~LOC~~ SOLN
20.0000 [IU] | Freq: Every day | SUBCUTANEOUS | Status: DC
  Administered 2018-05-12: 01:00:00 20 [IU] via SUBCUTANEOUS
  Filled 2018-05-12: qty 0.2

## 2018-05-12 MED ORDER — SODIUM CHLORIDE 0.9 % IV BOLUS: 1000.0000 mL | Freq: Once | INTRAVENOUS | Status: DC

## 2018-05-12 NOTE — ED Provider Notes (Signed)
[facility] Emergency Department Provider Note    First MD Initiated Contact with Patient 05/12/18 0001     (approximate)  I have reviewed the triage vital signs and the nursing notes.   HISTORY  Chief Complaint Hyperglycemia    HPI Caitlyn Fuller is a 43 y.o. female [place] past medical history previously on Levemir 24 units in the morning 20 at night for the past year presents the ER for elevated blood sugar for the past 4 days after running out of her Levemir.  States that she is also having some flank pain and increased urination.  No measured fevers.  No nausea or vomiting.  States that she also has a history of genital herpes and is having a flareup of that.  Would like to be given prescription for herpes treatment when she has had in the past.  Does not currently have a physician.  Denies any vaginal discharge or pelvic pain.    Past Medical History:  Diagnosis Date  . Asthma   . Diabetes mellitus without complication (HCC)   . Hernia   . Hypertension   . Kidney infection   . Shingles    No family history on file. Past Surgical History:  Procedure Laterality Date  . ABDOMINAL SURGERY    . CESAREAN SECTION    . CHOLECYSTECTOMY    . colonscopy    . TUBAL LIGATION     Patient Active Problem List   Diagnosis Date Noted  . Hyperglycemia without ketosis   . Chronic pain   . Herpes zoster   . Renal mass, left   . Essential hypertension   . Hyperglycemia 11/01/2014      Prior to Admission medications   Medication Sig Start Date End Date Taking? Authorizing Provider  albuterol (PROVENTIL HFA;VENTOLIN HFA) 108 (90 Base) MCG/ACT inhaler Inhale 2 puffs into the lungs every 6 (six) hours as needed for wheezing or shortness of breath. 01/12/16   Darci CurrentBrown, [place] N, MD  albuterol (PROVENTIL) (2.5 MG/3ML) 0.083% nebulizer solution Take 3 mLs (2.5 mg total) by nebulization every 4 (four) hours as needed for wheezing or shortness of breath. 01/12/16    Darci CurrentBrown, [place] N, MD  cyclobenzaprine (FLEXERIL) 10 MG tablet Take 1 tablet (10 mg total) 3 (three) times daily as needed by mouth for muscle spasms. 11/16/16   Fayrene Helperran, Bowie, PA-C  dicyclomine (BENTYL) 20 MG tablet Take 1 tablet (20 mg total) by mouth every 6 (six) hours as needed. 09/21/16   Irean HongSung, Jade J, MD  etodolac (LODINE) 200 MG capsule Take 1 capsule (200 mg total) by mouth every 8 (eight) hours. Patient not taking: Reported on 04/07/2016 03/15/16   Rebecka ApleyWebster, Allison P, MD  famotidine (PEPCID) 40 MG tablet Take 1 tablet (40 mg total) by mouth every evening. 07/25/17 07/25/18  Phineas SemenGoodman, Graydon, MD  furosemide (LASIX) 20 MG tablet Take 1 tablet (20 mg total) by mouth daily. 03/06/16 03/06/17  Minna AntisPaduchowski, Kevin, MD  HYDROcodone-acetaminophen (NORCO/VICODIN) 5-325 MG tablet Take 1 tablet by mouth every 6 (six) hours as needed for moderate pain. 04/07/16   Lawyer, Cristal Deerhristopher, PA-C  Insulin Detemir (LEVEMIR FLEXTOUCH) 100 UNIT/ML Pen Inject 24 Units into the skin every morning AND 24 Units at bedtime. 05/12/18 09/09/18  Willy Eddyobinson, Mignonne Afonso, MD  insulin NPH-regular Human (NOVOLIN 70/30) (70-30) 100 UNIT/ML injection Inject 38-48 Units into the skin 2 (two) times daily with a meal. Inject 48 units under the skin in the morning and 38 units under the skin at  night 07/25/17   Phineas Semen, MD  lisinopril (PRINIVIL,ZESTRIL) 10 MG tablet Take 10 mg by mouth daily.    [provider]  naproxen (NAPROSYN) 500 MG tablet Take 1 tablet (500 mg total) by mouth 2 (two) times daily with a meal. Patient not taking: Reported on 04/07/2016 04/26/15   Jene Every, MD  ondansetron (ZOFRAN ODT) 4 MG disintegrating tablet Take 1 tablet (4 mg total) by mouth every 8 (eight) hours as needed for nausea or vomiting. 09/21/16   Irean Hong, MD  oxyCODONE (ROXICODONE) 15 MG immediate release tablet Take 1 tablet (15 mg total) every 8 (eight) hours as needed by mouth for pain. 11/16/16   Fayrene Helper, PA-C  oxyCODONE-acetaminophen  (PERCOCET) 5-325 MG tablet Take 2 tablets by mouth every 6 (six) hours as needed for severe pain. 02/06/18   Loleta Rose, MD  sucralfate (CARAFATE) 1 g tablet Take 1 tablet (1 g total) by mouth 4 (four) times daily. 07/25/17   Phineas Semen, MD  valACYclovir (VALTREX) 1000 MG tablet Take 1 tablet (1,000 mg total) by mouth daily for 5 days. 05/12/18 05/17/18  Willy Eddy, MD    Allergies Gabapentin; Morphine and related; Darvocet [propoxyphene n-acetaminophen]; Toradol [ketorolac tromethamine]; Ultram [tramadol]; and Vancomycin    Social History Social History   Tobacco Use  . Smoking status: Current Every Day Smoker    Packs/day: 0.50    Types: Cigarettes  . Smokeless tobacco: Never Used  Substance Use Topics  . Alcohol use: No  . Drug use: No    Review of Systems Patient denies headaches, rhinorrhea, blurry vision, numbness, shortness of breath, chest pain, edema, cough, abdominal pain, nausea, vomiting, diarrhea, dysuria, fevers, rashes or hallucinations unless otherwise stated above in HPI. ____________________________________________   PHYSICAL EXAM:  VITAL SIGNS: Vitals:   05/11/18 2355  BP: (!) 204/96  Pulse: 84  Resp: 18  Temp: 98.3 F (36.8 C)  SpO2: 98%    Constitutional: Alert and oriented.  Eyes: Conjunctivae are normal.  Head: Atraumatic. Nose: No congestion/rhinnorhea. Mouth/Throat: Mucous membranes are moist.   Neck: No stridor. Painless ROM.  Cardiovascular: Normal rate, regular rhythm. Grossly normal heart sounds.  Good peripheral circulation. Respiratory: Normal respiratory effort.  No retractions. Lungs CTAB. Gastrointestinal: Soft and nontender. No distention. No abdominal bruits. No CVA tenderness. Genitourinary: deferred Musculoskeletal: No lower extremity tenderness nor edema.  No joint effusions. Neurologic:  Normal speech and language. No gross focal neurologic deficits are appreciated. No facial droop Skin:  Skin is warm, dry and  intact. No rash noted. Psychiatric: Mood and affect are normal. Speech and behavior are normal.  ____________________________________________   LABS (all labs ordered are listed, but only abnormal results are displayed)  Results for orders placed or performed during the hospital encounter of 05/12/18 (from the past 24 hour(s))  Glucose, capillary     Status: Abnormal   Collection Time: 05/11/18 11:56 PM  Result Value Ref Range   Glucose-Capillary 425 (H) 70 - 99 mg/dL  Basic metabolic panel     Status: Abnormal   Collection Time: 05/12/18 12:01 AM  Result Value Ref Range   Sodium 131 (L) 135 - 145 mmol/L   Potassium 4.2 3.5 - 5.1 mmol/L   Chloride 95 (L) 98 - 111 mmol/L   CO2 28 22 - 32 mmol/L   Glucose, Bld 411 (H) 70 - 99 mg/dL   BUN 13 6 - 20 mg/dL   Creatinine, Ser 1.61 0.44 - 1.00 mg/dL   Calcium 8.6 (L)  8.9 - 10.3 mg/dL   GFR calc non Af Amer >60 >60 mL/min   GFR calc Af Amer >60 >60 mL/min   Anion gap 8 5 - 15  CBC     Status: Abnormal   Collection Time: 05/12/18 12:01 AM  Result Value Ref Range   WBC 14.0 (H) 4.0 - 10.5 K/uL   RBC 5.45 (H) 3.87 - 5.11 MIL/uL   Hemoglobin 14.1 12.0 - 15.0 g/dL   HCT 40.9 81.1 - 91.4 %   MCV 80.6 80.0 - 100.0 fL   MCH 25.9 (L) 26.0 - 34.0 pg   MCHC 32.1 30.0 - 36.0 g/dL   RDW 78.2 95.6 - 21.3 %   Platelets 267 150 - 400 K/uL   nRBC 0.0 0.0 - 0.2 %  Urinalysis, Complete w Microscopic     Status: Abnormal   Collection Time: 05/12/18 12:01 AM  Result Value Ref Range   Color, Urine STRAW (A) YELLOW   APPearance CLEAR (A) CLEAR   Specific Gravity, Urine 1.037 (H) 1.005 - 1.030   pH 5.0 5.0 - 8.0   Glucose, UA >=500 (A) NEGATIVE mg/dL   Hgb urine dipstick SMALL (A) NEGATIVE   Bilirubin Urine NEGATIVE NEGATIVE   Ketones, ur NEGATIVE NEGATIVE mg/dL   Protein, ur NEGATIVE NEGATIVE mg/dL   Nitrite NEGATIVE NEGATIVE   Leukocytes,Ua NEGATIVE NEGATIVE   RBC / HPF 0-5 0 - 5 RBC/hpf   WBC, UA 0-5 0 - 5 WBC/hpf   Bacteria, UA NONE SEEN  NONE SEEN   Squamous Epithelial / LPF 0-5 0 - 5   Mucus PRESENT   Pregnancy, urine POC     Status: None   Collection Time: 05/12/18 12:07 AM  Result Value Ref Range   Preg Test, Ur NEGATIVE NEGATIVE   ____________________________________________ _____________________________  RADIOLOGY   ____________________________________________   PROCEDURES  Procedure(s) performed:  Procedures    Critical Care performed: no ____________________________________________   INITIAL IMPRESSION / ASSESSMENT AND PLAN / ED COURSE  Pertinent labs & imaging results that were available during my care of the patient were reviewed by me and considered in my medical decision making (see chart for details).   DDX: Hypoglycemia, dehydration, DKA, HON C, herpes, UTI, [place]  Tangela N Perlow is a 43 y.o. who presents to the ED with symptoms as described above.  Patient nontoxic-appearing.  Does have elevated glucose.  Will order blood work by differential.  Abdominal exam otherwise soft benign.  No respiratory distress.  Specifically no signs or symptoms of sepsis or infectious process.  Is reporting recurrence of her genital herpes.  States it feels similar to her previous outbreaks for which she gets a course of Valtrex and improves.  The patient will be placed on continuous pulse oximetry and telemetry for monitoring.  Laboratory evaluation will be sent to evaluate for the above complaints.     Clinical Course as of May 12 114  Wynelle Link May 12, 2018  0865 No evidence of DKA or acidosis.  We will give her prescribed Levemir.  Will recheck blood sugar.  No evidence of UTI.  Is receiving IV fluids.  No signs or symptoms of symptomatic hypertension.  [facility].   [PR]  0114 Blood work is reassuring.  Patient tolerating oral hydration.  Given IV fluids.  Given her scheduled meds.  Patient otherwise nontoxic and well-appearing.  Stable appropriate for outpatient follow-up.   [PR]     Clinical Course User Index [PR] Willy Eddy, MD  The patient was evaluated in Emergency Department today for the symptoms described in the history of present illness. He/she was evaluated in the context of the global COVID-19 pandemic, which necessitated consideration that the patient might be at risk for infection with the SARS-CoV-2 virus that causes COVID-19. Institutional protocols and algorithms that pertain to the evaluation of patients at risk for COVID-19 are in a state of rapid change based on information released by regulatory bodies including the CDC and federal and state organizations. These policies and algorithms were followed during the patient's care in the ED.  As part of my medical decision making, I reviewed the following data within the electronic MEDICAL RECORD NUMBER Nursing notes reviewed and incorporated, Labs reviewed, notes from prior ED visits and [place] Controlled Substance Database   ____________________________________________   FINAL CLINICAL IMPRESSION(S) / ED DIAGNOSES  Final diagnoses:  Hyperglycemia      NEW MEDICATIONS STARTED DURING THIS VISIT:  New Prescriptions   VALACYCLOVIR (VALTREX) 1000 MG TABLET    Take 1 tablet (1,000 mg total) by mouth daily for 5 days.     Note:  This document was prepared using Dragon voice recognition software and may include unintentional dictation errors.    Willy Eddy, MD 05/12/18 [phone]

## 2018-07-10 ENCOUNTER — Telehealth: Payer: Self-pay | Admitting: Pharmacy Technician

## 2018-07-10 NOTE — Telephone Encounter (Signed)
Patient returned all of her paperwork, but we discovered she has Medicaid. Denial letter was mailed to patient alerting her of her Medicaid status.  Velda Shell, CPhT Recertification Specialist

## 2018-07-25 ENCOUNTER — Telehealth: Payer: Self-pay | Admitting: Pharmacy Technician

## 2018-07-25 NOTE — Telephone Encounter (Signed)
Error Caitlyn Fuller
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
## 2018-12-30 ENCOUNTER — Other Ambulatory Visit: Payer: Self-pay

## 2018-12-30 ENCOUNTER — Encounter: Payer: Self-pay | Admitting: Emergency Medicine

## 2018-12-30 DIAGNOSIS — A6004 Herpesviral vulvovaginitis: Secondary | ICD-10-CM | POA: Insufficient documentation

## 2018-12-30 DIAGNOSIS — E119 Type 2 diabetes mellitus without complications: Secondary | ICD-10-CM | POA: Diagnosis not present

## 2018-12-30 DIAGNOSIS — F17210 Nicotine dependence, cigarettes, uncomplicated: Secondary | ICD-10-CM | POA: Insufficient documentation

## 2018-12-30 DIAGNOSIS — I10 Essential (primary) hypertension: Secondary | ICD-10-CM | POA: Insufficient documentation

## 2018-12-30 DIAGNOSIS — Z794 Long term (current) use of insulin: Secondary | ICD-10-CM | POA: Insufficient documentation

## 2018-12-30 DIAGNOSIS — J45909 Unspecified asthma, uncomplicated: Secondary | ICD-10-CM | POA: Diagnosis not present

## 2018-12-30 DIAGNOSIS — Z79899 Other long term (current) drug therapy: Secondary | ICD-10-CM | POA: Insufficient documentation

## 2018-12-30 NOTE — ED Triage Notes (Signed)
Patient presents to the ED with a vaginal herpes outbreak.  Patient is tearful in triage due to pain.  Patient reports large blisters to area.  Patient reports pain to urinate or wipe after using the restroom.  Patient states, "this is the worst it's ever been."

## 2018-12-31 ENCOUNTER — Emergency Department
Admission: EM | Admit: 2018-12-31 | Discharge: 2018-12-31 | Disposition: A | Discharge status: Home / Self Care | Payer: Medicaid Other | Attending: Student | Admitting: Student

## 2018-12-31 DIAGNOSIS — A6004 Herpesviral vulvovaginitis: Secondary | ICD-10-CM

## 2018-12-31 LAB — POC URINE PREG, ED: Preg Test, Ur: NEGATIVE

## 2018-12-31 MED ORDER — LIDOCAINE 4 % EX CREA
TOPICAL_CREAM | Freq: Once | CUTANEOUS | Status: AC
  Administered 2018-12-31: 1 via TOPICAL
  Filled 2018-12-31: qty 5

## 2018-12-31 MED ORDER — OXYCODONE-ACETAMINOPHEN 5-325 MG PO TABS: 1.0000 | ORAL_TABLET | Freq: Four times a day (QID) | ORAL | 0 refills | Status: AC | PRN

## 2018-12-31 MED ORDER — VALACYCLOVIR HCL 1 G PO TABS: 1000.0000 mg | ORAL_TABLET | Freq: Every day | ORAL | 0 refills | Status: AC

## 2018-12-31 MED ORDER — FLUCONAZOLE 50 MG PO TABS
150.0000 mg | ORAL_TABLET | Freq: Once | ORAL | Status: AC
  Administered 2018-12-31: 150 mg via ORAL
  Filled 2018-12-31: qty 1

## 2018-12-31 MED ORDER — VALACYCLOVIR HCL 500 MG PO TABS
1000.0000 mg | ORAL_TABLET | Freq: Once | ORAL | Status: AC
  Administered 2018-12-31: 1000 mg via ORAL
  Filled 2018-12-31: qty 2

## 2018-12-31 MED ORDER — OXYCODONE-ACETAMINOPHEN 5-325 MG PO TABS
1.0000 | ORAL_TABLET | Freq: Once | ORAL | Status: AC
  Administered 2018-12-31: 02:00:00 1 via ORAL
  Filled 2018-12-31: qty 1

## 2018-12-31 NOTE — Discharge Instructions (Addendum)
Thank you for letting us take care of you in the emergency department today.   Please continue to take any regular, prescribed medications.   New medications we have prescribed:  - Valacyclovir (Valtrex) - take 1 gram daily for 4 days (we gave you your first dose here in the ER). Start your next dose on 12/23 AM - Percocet - as needed for pain  Please follow up with: - Your primary care doctor or your OBGYN doctor to review your ER visit and follow up on your symptoms.    Please return to the ER for any new or worsening symptoms.

## 2018-12-31 NOTE — ED Provider Notes (Signed)
[facility] Emergency Department Provider Note  ____________________________________________   First MD Initiated Contact with Patient 12/31/18 0040     (approximate)  I have reviewed the triage vital signs and the nursing notes.  History  Chief Complaint SEXUALLY TRANSMITTED DISEASE    HPI Caitlyn Fuller is a 43 y.o. female who presents to the emergency department with concern for vaginal herpes outbreak. She states she has lesions all along her labia and the inner part of her vagina.  Patient has a history of the same and symptoms are similar, states this outbreak started approximately 4 days ago.  She states this outbreak feels more severe in pain than prior, pain is 10/10 in severity, constant, no alleviating or aggravating factors.  No radiation.  She describes it as sharp/burning, like she is being stung by a bee.  She denies any vaginal discharge.  She has severe amount of pain while wiping or using the restroom.  She denies any concern for other STD exposure. She has not tried anything for the pain.   Past Medical Hx Past Medical History:  Diagnosis Date  . Asthma   . Diabetes mellitus without complication ([place])   . Hernia   . Hypertension   . Kidney infection   . Shingles     Problem List Patient Active Problem List   Diagnosis Date Noted  . Hyperglycemia without ketosis   . Chronic pain   . Herpes zoster   . Renal mass, left   . Essential hypertension   . Hyperglycemia 11/01/2014    Past Surgical Hx Past Surgical History:  Procedure Laterality Date  . ABDOMINAL SURGERY    . CESAREAN SECTION    . CHOLECYSTECTOMY    . colonscopy    . TUBAL LIGATION      Medications Prior to Admission medications   Medication Sig Start Date End Date Taking? Authorizing Provider  albuterol (PROVENTIL HFA;VENTOLIN HFA) 108 (90 Base) MCG/ACT inhaler Inhale 2 puffs into the lungs every 6 (six) hours as needed for wheezing or shortness of breath.  01/12/16   Gregor Hams, MD  albuterol (PROVENTIL) (2.5 MG/3ML) 0.083% nebulizer solution Take 3 mLs (2.5 mg total) by nebulization every 4 (four) hours as needed for wheezing or shortness of breath. 01/12/16   Gregor Hams, MD  cyclobenzaprine (FLEXERIL) 10 MG tablet Take 1 tablet (10 mg total) 3 (three) times daily as needed by mouth for muscle spasms. 11/16/16   Domenic Moras, PA-C  dicyclomine (BENTYL) 20 MG tablet Take 1 tablet (20 mg total) by mouth every 6 (six) hours as needed. 09/21/16   Paulette Blanch, MD  etodolac (LODINE) 200 MG capsule Take 1 capsule (200 mg total) by mouth every 8 (eight) hours. Patient not taking: Reported on 04/07/2016 03/15/16   Loney Hering, MD  famotidine (PEPCID) 40 MG tablet Take 1 tablet (40 mg total) by mouth every evening. 07/25/17 07/25/18  Nance Pear, MD  furosemide (LASIX) 20 MG tablet Take 1 tablet (20 mg total) by mouth daily. 03/06/16 03/06/17  Harvest Dark, MD  HYDROcodone-acetaminophen (NORCO/VICODIN) 5-325 MG tablet Take 1 tablet by mouth every 6 (six) hours as needed for moderate pain. 04/07/16   Lawyer, Harrell Gave, PA-C  Insulin Detemir (LEVEMIR FLEXTOUCH) 100 UNIT/ML Pen Inject 24 Units into the skin every morning AND 24 Units at bedtime. 05/12/18 09/09/18  Merlyn Lot, MD  insulin NPH-regular Human (NOVOLIN 70/30) (70-30) 100 UNIT/ML injection Inject 38-48 Units into the skin 2 (two) times  daily with a meal. Inject 48 units under the skin in the morning and 38 units under the skin at night 07/25/17   Phineas SemenGoodman, Graydon, MD  lisinopril (PRINIVIL,ZESTRIL) 10 MG tablet Take 10 mg by mouth daily.    [provider]  naproxen (NAPROSYN) 500 MG tablet Take 1 tablet (500 mg total) by mouth 2 (two) times daily with a meal. Patient not taking: Reported on 04/07/2016 04/26/15   Jene EveryKinner, Robert, MD  ondansetron (ZOFRAN ODT) 4 MG disintegrating tablet Take 1 tablet (4 mg total) by mouth every 8 (eight) hours as needed for nausea or vomiting.  09/21/16   Irean HongSung, Jade J, MD  oxyCODONE (ROXICODONE) 15 MG immediate release tablet Take 1 tablet (15 mg total) every 8 (eight) hours as needed by mouth for pain. 11/16/16   Fayrene Helperran, [place], PA-C  oxyCODONE-acetaminophen (PERCOCET) 5-325 MG tablet Take 2 tablets by mouth every 6 (six) hours as needed for severe pain. 02/06/18   Loleta RoseForbach, Cory, MD  sucralfate (CARAFATE) 1 g tablet Take 1 tablet (1 g total) by mouth 4 (four) times daily. 07/25/17   Phineas SemenGoodman, Graydon, MD    Allergies Gabapentin, Morphine and related, Darvocet [propoxyphene n-acetaminophen], Toradol [ketorolac tromethamine], Ultram [tramadol], and Vancomycin  Family Hx No family history on file.  Social Hx Social History   Tobacco Use  . Smoking status: Current Every Day Smoker    Packs/day: 0.50    Types: Cigarettes  . Smokeless tobacco: Never Used  Substance Use Topics  . Alcohol use: No  . Drug use: No     Review of Systems  Constitutional: Negative for fever, chills. Eyes: Negative for visual changes. ENT: Negative for sore throat. Cardiovascular: Negative for chest pain. Respiratory: Negative for shortness of breath. Gastrointestinal: Negative for nausea, vomiting.  Genitourinary: + vulvovaginal herpes Musculoskeletal: Negative for leg swelling. Skin: Negative for rash. Neurological: Negative for for headaches.   Physical Exam  Vital Signs: ED Triage Vitals  Enc Vitals Group     BP 12/30/18 2231 (!) 158/100     Pulse Rate 12/30/18 2231 (!) 118     Resp 12/30/18 2231 19     Temp 12/30/18 2231 99.1 F (37.3 C)     Temp Source 12/30/18 2231 Oral     SpO2 12/30/18 2231 97 %     Weight 12/30/18 2235 242 lb (109.8 kg)     Height 12/30/18 2235 5' (1.524 m)     Head Circumference --      Peak Flow --      Pain Score 12/30/18 2234 10     Pain Loc --      Pain Edu? --      Excl. in GC? --     Constitutional: Alert and oriented. Appears uncomfortable.  Head: Normocephalic. Atraumatic. Eyes: Conjunctivae  clear. Sclera anicteric. Nose: No congestion. No rhinorrhea. Mouth/Throat: Wearing mask.  Neck: No stridor.   Cardiovascular: Tachycardic. Extremities well perfused. Respiratory: Normal respiratory effort.  Musculoskeletal: No lower extremity edema. No deformities. Neurologic:  Normal speech and language. No gross focal neurologic deficits are appreciated.  Skin: RN chaperone present. Several vulvovaginal lesions (~0.5-1 cm in size) along the bilateral labia, erythematous bases with shallow ulcers c/w genital herpetic lesions. No active vesicles. Difficult exam 2/2 pain. Small amount of thick whitish discharge along the labia majora as well - possible candidiasis.  Psychiatric: Mood and affect are appropriate for situation.  EKG  N/A   Radiology  N/A   Procedures  Procedure(s) performed (including critical  care):  Procedures   Initial Impression / Assessment and Plan / ED Course  43 y.o. female who presents to the ED for recurrent vulvovaginal herpes.  Exam consistent with vulvovaginal herpes, as above. Also with small amount of thick, whitish discharge along the labia suspicious for possible candidiasis as well. Will give dose of fluconazole, valacyclovir, and pain medication here, and plan for discharge with Rx course of antivirals and short course pain control.  Patient denies any concern for any other STD exposure, states she has not engaged in sexual intercourse in 8 months, declines further STD testing at this time.  She voices understanding of the plan and is comfortable w/ discharge. Given return precautions.    Final Clinical Impression(s) / ED Diagnosis  Final diagnoses:  Recurrent vulvovaginal herpes simplex       Note:  This document was prepared using Dragon voice recognition software and may include unintentional dictation errors.   Miguel Aschoff., MD 12/31/18 [phone]

## 2020-08-21 ENCOUNTER — Emergency Department: Payer: Medicaid Other

## 2020-08-21 ENCOUNTER — Other Ambulatory Visit: Payer: Self-pay

## 2020-08-21 ENCOUNTER — Emergency Department
Admission: EM | Admit: 2020-08-21 | Discharge: 2020-08-22 | Disposition: A | Discharge status: Home / Self Care | Payer: Medicaid Other | Attending: Emergency Medicine | Admitting: Emergency Medicine

## 2020-08-21 DIAGNOSIS — E119 Type 2 diabetes mellitus without complications: Secondary | ICD-10-CM | POA: Diagnosis not present

## 2020-08-21 DIAGNOSIS — R519 Headache, unspecified: Secondary | ICD-10-CM | POA: Diagnosis present

## 2020-08-21 DIAGNOSIS — F17210 Nicotine dependence, cigarettes, uncomplicated: Secondary | ICD-10-CM | POA: Diagnosis not present

## 2020-08-21 DIAGNOSIS — I10 Essential (primary) hypertension: Secondary | ICD-10-CM | POA: Insufficient documentation

## 2020-08-21 DIAGNOSIS — L03811 Cellulitis of head [any part, except face]: Secondary | ICD-10-CM

## 2020-08-21 DIAGNOSIS — J45909 Unspecified asthma, uncomplicated: Secondary | ICD-10-CM | POA: Insufficient documentation

## 2020-08-21 DIAGNOSIS — Z794 Long term (current) use of insulin: Secondary | ICD-10-CM | POA: Insufficient documentation

## 2020-08-21 DIAGNOSIS — Z79899 Other long term (current) drug therapy: Secondary | ICD-10-CM | POA: Insufficient documentation

## 2020-08-21 LAB — CBC WITH DIFFERENTIAL/PLATELET
Abs Immature Granulocytes: 0.05 10*3/uL (ref 0.00–0.07)
Basophils Absolute: 0.1 10*3/uL (ref 0.0–0.1)
Basophils Relative: 0 %
Eosinophils Absolute: 0.1 10*3/uL (ref 0.0–0.5)
Eosinophils Relative: 1 %
HCT: 46.2 % — ABNORMAL HIGH (ref 36.0–46.0)
Hemoglobin: 15.2 g/dL — ABNORMAL HIGH (ref 12.0–15.0)
Immature Granulocytes: 0 %
Lymphocytes Relative: 29 %
Lymphs Abs: 4.4 10*3/uL — ABNORMAL HIGH (ref 0.7–4.0)
MCH: 26.6 pg (ref 26.0–34.0)
MCHC: 32.9 g/dL (ref 30.0–36.0)
MCV: 80.9 fL (ref 80.0–100.0)
Monocytes Absolute: 1 10*3/uL (ref 0.1–1.0)
Monocytes Relative: 7 %
Neutro Abs: 9.6 10*3/uL — ABNORMAL HIGH (ref 1.7–7.7)
Neutrophils Relative %: 63 %
Platelets: 334 10*3/uL (ref 150–400)
RBC: 5.71 MIL/uL — ABNORMAL HIGH (ref 3.87–5.11)
RDW: 14.1 % (ref 11.5–15.5)
WBC: 15.3 10*3/uL — ABNORMAL HIGH (ref 4.0–10.5)
nRBC: 0 % (ref 0.0–0.2)

## 2020-08-21 LAB — COMPREHENSIVE METABOLIC PANEL
ALT: 12 U/L (ref 0–44)
AST: 14 U/L — ABNORMAL LOW (ref 15–41)
Albumin: 3.7 g/dL (ref 3.5–5.0)
Alkaline Phosphatase: 87 U/L (ref 38–126)
Anion gap: 7 (ref 5–15)
BUN: 12 mg/dL (ref 6–20)
CO2: 29 mmol/L (ref 22–32)
Calcium: 8.8 mg/dL — ABNORMAL LOW (ref 8.9–10.3)
Chloride: 93 mmol/L — ABNORMAL LOW (ref 98–111)
Creatinine, Ser: 0.62 mg/dL (ref 0.44–1.00)
GFR, Estimated: >60 mL/min (ref >60)
Glucose, Bld: 436 mg/dL — ABNORMAL HIGH (ref 70–99)
Potassium: 4.1 mmol/L (ref 3.5–5.1)
Sodium: 129 mmol/L — ABNORMAL LOW (ref 135–145)
Total Bilirubin: 0.8 mg/dL (ref 0.3–1.2)
Total Protein: 7.7 g/dL (ref 6.5–8.1)

## 2020-08-21 MED ORDER — SODIUM CHLORIDE 0.9 % IV BOLUS
500.0000 mL | Freq: Once | INTRAVENOUS | Status: AC
  Administered 2020-08-21: 500 mL via INTRAVENOUS

## 2020-08-21 MED ORDER — NAPROXEN 500 MG PO TABS
500.0000 mg | ORAL_TABLET | Freq: Once | ORAL | Status: AC
  Administered 2020-08-21: 500 mg via ORAL
  Filled 2020-08-21: qty 1

## 2020-08-21 MED ORDER — ACETAMINOPHEN 500 MG PO TABS
1000.0000 mg | ORAL_TABLET | Freq: Once | ORAL | Status: AC
  Administered 2020-08-21: 1000 mg via ORAL
  Filled 2020-08-21: qty 2

## 2020-08-21 NOTE — ED Triage Notes (Signed)
Upon inspection of pt's head, swollen and red, with purulent drainage. Pt. Denies fever.

## 2020-08-21 NOTE — ED Notes (Signed)
Socks provided upon patient request

## 2020-08-21 NOTE — ED Triage Notes (Addendum)
C/o multiple painful "knots" to head, neck and face x2 days. Pt. States she has been losing weight x2-3 months. Denies fevers, states she vomited yesterday. 2 goody powders pta.

## 2020-08-21 NOTE — ED Notes (Signed)
PT transported to CT via wheelchair.

## 2020-08-22 MED ORDER — CEPHALEXIN 500 MG PO CAPS
500.0000 mg | ORAL_CAPSULE | Freq: Once | ORAL | Status: AC
  Administered 2020-08-22: 500 mg via ORAL
  Filled 2020-08-22: qty 1

## 2020-08-22 MED ORDER — CEPHALEXIN 500 MG PO CAPS: 500.0000 mg | ORAL_CAPSULE | Freq: Three times a day (TID) | ORAL | 0 refills | Status: DC

## 2020-08-22 MED ORDER — DOXYCYCLINE HYCLATE 100 MG PO CAPS: 100.0000 mg | ORAL_CAPSULE | Freq: Two times a day (BID) | ORAL | 0 refills | Status: AC

## 2020-08-22 MED ORDER — DOXYCYCLINE HYCLATE 100 MG PO TABS
100.0000 mg | ORAL_TABLET | Freq: Once | ORAL | Status: AC
  Administered 2020-08-22: 100 mg via ORAL
  Filled 2020-08-22: qty 1

## 2020-08-22 NOTE — ED Provider Notes (Signed)
[facility] Emergency Department Provider Note  ____________________________________________  Time seen: Approximately 12:12 AM  I have reviewed the triage vital signs and the nursing notes.   HISTORY  Chief Complaint Headache (C/o multiple painful "knots" to head, neck and face x2 days. Pt. States she has been losing weight x2-3 months.)    HPI Caitlyn Fuller is a 45 y.o. female with a history of hypertension diabetes who comes ED complaining of multiple painful lesions on the scalp.  Lesions are all over, right left back.  Throbbing.  Moderate intensity, nonradiating.  No recent trauma.  No vision changes or vomiting.  This is been going on for the last 2 days.    Past Medical History:  Diagnosis Date   Asthma    Diabetes mellitus without complication (HCC)    Hernia    Hypertension    Kidney infection    Shingles      Patient Active Problem List   Diagnosis Date Noted   Hyperglycemia without ketosis    Chronic pain    Herpes zoster    Renal mass, left    Essential hypertension    Hyperglycemia 11/01/2014     Past Surgical History:  Procedure Laterality Date   ABDOMINAL SURGERY     CESAREAN SECTION     CHOLECYSTECTOMY     colonscopy     TUBAL LIGATION       Prior to Admission medications   Medication Sig Start Date End Date Taking? Authorizing Provider  cephALEXin (KEFLEX) 500 MG capsule Take 1 capsule (500 mg total) by mouth 3 (three) times daily. 08/22/20  Yes Sharman CheekStafford, Amandeep Nesmith, MD  doxycycline (VIBRAMYCIN) 100 MG capsule Take 1 capsule (100 mg total) by mouth 2 (two) times daily for 10 days. 08/22/20 09/01/20 Yes Sharman CheekStafford, Clarice Bonaventure, MD  albuterol (PROVENTIL HFA;VENTOLIN HFA) 108 (90 Base) MCG/ACT inhaler Inhale 2 puffs into the lungs every 6 (six) hours as needed for wheezing or shortness of breath. 01/12/16   Darci CurrentBrown, [place] N, MD  albuterol (PROVENTIL) (2.5 MG/3ML) 0.083% nebulizer solution Take 3 mLs (2.5 mg total) by  nebulization every 4 (four) hours as needed for wheezing or shortness of breath. 01/12/16   Darci CurrentBrown, [place] N, MD  cyclobenzaprine (FLEXERIL) 10 MG tablet Take 1 tablet (10 mg total) 3 (three) times daily as needed by mouth for muscle spasms. 11/16/16   Fayrene Helperran, Bowie, PA-C  dicyclomine (BENTYL) 20 MG tablet Take 1 tablet (20 mg total) by mouth every 6 (six) hours as needed. 09/21/16   Irean HongSung, Jade J, MD  etodolac (LODINE) 200 MG capsule Take 1 capsule (200 mg total) by mouth every 8 (eight) hours. Patient not taking: Reported on 04/07/2016 03/15/16   Rebecka ApleyWebster, Allison P, MD  famotidine (PEPCID) 40 MG tablet Take 1 tablet (40 mg total) by mouth every evening. 07/25/17 07/25/18  Phineas SemenGoodman, Graydon, MD  furosemide (LASIX) 20 MG tablet Take 1 tablet (20 mg total) by mouth daily. 03/06/16 03/06/17  Minna AntisPaduchowski, Kevin, MD  HYDROcodone-acetaminophen (NORCO/VICODIN) 5-325 MG tablet Take 1 tablet by mouth every 6 (six) hours as needed for moderate pain. 04/07/16   Lawyer, Cristal Deerhristopher, PA-C  Insulin Detemir (LEVEMIR FLEXTOUCH) 100 UNIT/ML Pen Inject 24 Units into the skin every morning AND 24 Units at bedtime. 05/12/18 09/09/18  Willy Eddyobinson, Patrick, MD  insulin NPH-regular Human (NOVOLIN 70/30) (70-30) 100 UNIT/ML injection Inject 38-48 Units into the skin 2 (two) times daily with a meal. Inject 48 units under the skin in the morning and 38 units under  the skin at night 07/25/17   Phineas Semen, MD  lisinopril (PRINIVIL,ZESTRIL) 10 MG tablet Take 10 mg by mouth daily.    [provider]  naproxen (NAPROSYN) 500 MG tablet Take 1 tablet (500 mg total) by mouth 2 (two) times daily with a meal. Patient not taking: Reported on 04/07/2016 04/26/15   Jene Every, MD  ondansetron (ZOFRAN ODT) 4 MG disintegrating tablet Take 1 tablet (4 mg total) by mouth every 8 (eight) hours as needed for nausea or vomiting. 09/21/16   Irean Hong, MD  oxyCODONE (ROXICODONE) 15 MG immediate release tablet Take 1 tablet (15 mg total) every 8  (eight) hours as needed by mouth for pain. 11/16/16   Fayrene Helper, PA-C  oxyCODONE-acetaminophen (PERCOCET) 5-325 MG tablet Take 2 tablets by mouth every 6 (six) hours as needed for severe pain. 02/06/18   Loleta Rose, MD  sucralfate (CARAFATE) 1 g tablet Take 1 tablet (1 g total) by mouth 4 (four) times daily. 07/25/17   Phineas Semen, MD     Allergies Gabapentin, Morphine and related, Darvocet [propoxyphene n-acetaminophen], Toradol [ketorolac tromethamine], Ultram [tramadol], and Vancomycin   No family history on file.  Social History Social History   Tobacco Use   Smoking status: Every Day    Packs/day: 0.50    Types: Cigarettes   Smokeless tobacco: Never  Vaping Use   Vaping Use: Never used  Substance Use Topics   Alcohol use: No   Drug use: No    Review of Systems  Constitutional:   No fever positive chills.  ENT:   No sore throat. No rhinorrhea. Cardiovascular:   No chest pain or syncope. Respiratory:   No dyspnea or cough. Gastrointestinal:   Negative for abdominal pain, vomiting and diarrhea.  Musculoskeletal:   Negative for focal pain or swelling All other systems reviewed and are negative except as documented above in ROS and HPI.  ____________________________________________   PHYSICAL EXAM:  VITAL SIGNS: ED Triage Vitals  Enc Vitals Group     BP 08/21/20 2126 (!) 212/128     Pulse Rate 08/21/20 2124 (!) 113     Resp 08/21/20 2124 20     Temp 08/21/20 2124 98.6 F (37 C)     Temp Source 08/21/20 2124 Oral     SpO2 08/21/20 2124 99 %     Weight 08/21/20 2127 217 lb (98.4 kg)     Height 08/21/20 2127 4\' 10"  (1.473 m)     Head Circumference --      Peak Flow --      Pain Score 08/21/20 2125 10     Pain Loc --      Pain Edu? --      Excl. in GC? --     Vital signs reviewed, nursing assessments reviewed.   Constitutional:   Alert and oriented. Non-toxic appearance. Eyes:   Conjunctivae are normal. EOMI. PERRL. ENT      Head:   Normocephalic  with multiple areas of small wound with surrounding cellulitis.  On the right parietal scalp there is a 1 cm soft tissue eschar surrounded by induration and cellulitis suspicious for venomous spider bite.  None of the lesions are raised or fluctuant.  No current purulent drainage      Nose:   Normal      Mouth/Throat:   Normal, moist mucosa..      Neck:   No meningismus. Full ROM. Hematological/Lymphatic/Immunilogical:   No cervical lymphadenopathy. Cardiovascular:   RRR, heart rate  80. Symmetric bilateral radial and DP pulses.  No murmurs. Cap refill less than 2 seconds. Respiratory:   Normal respiratory effort without tachypnea/retractions. Breath sounds are clear and equal bilaterally. No wheezes/rales/rhonchi. Gastrointestinal:   Soft and nontender. Non distended. There is no CVA tenderness.  No rebound, rigidity, or guarding.  Musculoskeletal:   Normal range of motion in all extremities. No joint effusions.  No lower extremity tenderness.  No edema. Neurologic:   Normal speech and language.  Motor grossly intact. No acute focal neurologic deficits are appreciated.  Skin:    Skin is warm, dry with scalp wounds as above.. No rash noted.  No petechiae, purpura, or bullae.  ____________________________________________    LABS (pertinent positives/negatives) (all labs ordered are listed, but only abnormal results are displayed) Labs Reviewed  CBC WITH DIFFERENTIAL/PLATELET - Abnormal; Notable for the following components:      Result Value   WBC 15.3 (*)    RBC 5.71 (*)    Hemoglobin 15.2 (*)    HCT 46.2 (*)    Neutro Abs 9.6 (*)    Lymphs Abs 4.4 (*)    All other components within normal limits  COMPREHENSIVE METABOLIC PANEL - Abnormal; Notable for the following components:   Sodium 129 (*)    Chloride 93 (*)    Glucose, Bld 436 (*)    Calcium 8.8 (*)    AST 14 (*)    All other components within normal limits    ____________________________________________   EKG    ____________________________________________    RADIOLOGY  CT HEAD WO CONTRAST ( )  Result Date: 08/21/2020 CLINICAL DATA:  Soft tissue swelling on the right with purulence drainage EXAM: CT HEAD WITHOUT CONTRAST TECHNIQUE: Contiguous axial images were obtained from the base of the skull through the vertex without intravenous contrast. COMPARISON:  02/17/2012 FINDINGS: Brain: No evidence of acute infarction, hemorrhage, hydrocephalus, extra-axial collection or mass lesion/mass effect. Vascular: No hyperdense vessel or unexpected calcification. Skull: Normal. Negative for fracture or focal lesion. Sinuses/Orbits: No acute finding. Other: In the right posterior parietal scalp, near the vertex there is an area of subcutaneous edema which corresponds to the draining wound. No discrete fluid collection is noted to suggest drainable abscess changes likely represent focal cellulitis. IMPRESSION: No acute intracranial abnormality noted. Area of cellulitis in the right posterior parietal scalp near the vertex. Electronically Signed   By: Alcide Clever M.D.   On: 08/21/2020 22:52    ____________________________________________   PROCEDURES Procedures  ____________________________________________  DIFFERENTIAL DIAGNOSIS   Scalp cellulitis, skull osteomyelitis, intracranial abscess  CLINICAL IMPRESSION / ASSESSMENT AND PLAN / ED COURSE  Medications ordered in the ED: Medications  doxycycline (VIBRA-TABS) tablet 100 mg (has no administration in time range)  cephALEXin (KEFLEX) capsule 500 mg (has no administration in time range)  sodium chloride 0.9 % bolus 500 mL (500 mLs Intravenous Bolus 08/21/20 2226)  naproxen (NAPROSYN) tablet 500 mg (500 mg Oral Given 08/21/20 2334)  acetaminophen (TYLENOL) tablet 1,000 mg (1,000 mg Oral Given 08/21/20 2334)    Pertinent labs & imaging results that were available during my care of the patient  were reviewed by me and considered in my medical decision making (see chart for details).  Caitlyn Fuller was evaluated in Emergency Department on 08/22/2020 for the symptoms described in the history of present illness. She was evaluated in the context of the global COVID-19 pandemic, which necessitated consideration that the patient might be at risk for infection with the SARS-CoV-2 virus that causes  COVID-19. Institutional protocols and algorithms that pertain to the evaluation of patients at risk for COVID-19 are in a state of rapid change based on information released by regulatory bodies including the CDC and federal and state organizations. These policies and algorithms were followed during the patient's care in the ED.   Patient presents with multiple cellulitic lesions on the scalp, concerning for MRSA infection.  CT negative for deeper infection.  No evidence of osteomyelitis, necrotizing fasciitis, abscess.  She reports an allergy to Bactrim so I will give her doxycycline and Keflex and have her follow-up with primary care.      ____________________________________________   FINAL CLINICAL IMPRESSION(S) / ED DIAGNOSES    Final diagnoses:  Abscess or cellulitis of scalp  Type 2 diabetes mellitus without complication, with long-term current use of insulin [facility])     ED Discharge Orders          Ordered    doxycycline (VIBRAMYCIN) 100 MG capsule  2 times daily        08/22/20 0012    cephALEXin (KEFLEX) 500 MG capsule  3 times daily        08/22/20 0012            Portions of this note were generated with dragon dictation software. Dictation errors may occur despite best attempts at proofreading.    Sharman Cheek, MD 08/22/20 [phone]

## 2020-08-22 NOTE — ED Notes (Signed)
E signing pad not working. Pt A&Ox4 ambulatory at d/c with independent steady gait, NAD. Pt verbalized understanding of d/c instructions, prescriptions and follow up care and given the opportunity to ask questions.

## 2021-04-12 ENCOUNTER — Other Ambulatory Visit: Payer: Self-pay

## 2021-04-12 ENCOUNTER — Emergency Department
Admission: EM | Admit: 2021-04-12 | Discharge: 2021-04-12 | Disposition: A | Discharge status: Other Acute Inpatient Hospital | Payer: Medicaid Other | Attending: Emergency Medicine | Admitting: Emergency Medicine

## 2021-04-12 ENCOUNTER — Inpatient Hospital Stay (HOSPITAL_COMMUNITY): Payer: Medicaid Other

## 2021-04-12 ENCOUNTER — Emergency Department: Payer: Medicaid Other

## 2021-04-12 ENCOUNTER — Inpatient Hospital Stay (HOSPITAL_COMMUNITY)
Admission: EM | Admit: 2021-04-12 | Discharge: 2021-04-14 | DRG: 064 | Disposition: A | Discharge status: Home / Self Care | Payer: Medicaid Other | Source: Other Acute Inpatient Hospital | Attending: Neurology | Admitting: Neurology

## 2021-04-12 DIAGNOSIS — J45909 Unspecified asthma, uncomplicated: Secondary | ICD-10-CM | POA: Diagnosis present

## 2021-04-12 DIAGNOSIS — F1210 Cannabis abuse, uncomplicated: Secondary | ICD-10-CM | POA: Diagnosis present

## 2021-04-12 DIAGNOSIS — G4089 Other seizures: Secondary | ICD-10-CM | POA: Diagnosis present

## 2021-04-12 DIAGNOSIS — Z79899 Other long term (current) drug therapy: Secondary | ICD-10-CM

## 2021-04-12 DIAGNOSIS — I6389 Other cerebral infarction: Secondary | ICD-10-CM

## 2021-04-12 DIAGNOSIS — Z794 Long term (current) use of insulin: Secondary | ICD-10-CM | POA: Diagnosis not present

## 2021-04-12 DIAGNOSIS — I69398 Other sequelae of cerebral infarction: Secondary | ICD-10-CM

## 2021-04-12 DIAGNOSIS — E119 Type 2 diabetes mellitus without complications: Secondary | ICD-10-CM | POA: Diagnosis not present

## 2021-04-12 DIAGNOSIS — I629 Nontraumatic intracranial hemorrhage, unspecified: Secondary | ICD-10-CM | POA: Diagnosis not present

## 2021-04-12 DIAGNOSIS — E785 Hyperlipidemia, unspecified: Secondary | ICD-10-CM | POA: Diagnosis present

## 2021-04-12 DIAGNOSIS — G8929 Other chronic pain: Secondary | ICD-10-CM | POA: Diagnosis present

## 2021-04-12 DIAGNOSIS — I611 Nontraumatic intracerebral hemorrhage in hemisphere, cortical: Principal | ICD-10-CM | POA: Diagnosis present

## 2021-04-12 DIAGNOSIS — I615 Nontraumatic intracerebral hemorrhage, intraventricular: Secondary | ICD-10-CM | POA: Diagnosis present

## 2021-04-12 DIAGNOSIS — F17210 Nicotine dependence, cigarettes, uncomplicated: Secondary | ICD-10-CM | POA: Diagnosis present

## 2021-04-12 DIAGNOSIS — E669 Obesity, unspecified: Secondary | ICD-10-CM | POA: Diagnosis present

## 2021-04-12 DIAGNOSIS — R569 Unspecified convulsions: Secondary | ICD-10-CM | POA: Diagnosis present

## 2021-04-12 DIAGNOSIS — I619 Nontraumatic intracerebral hemorrhage, unspecified: Principal | ICD-10-CM | POA: Diagnosis present

## 2021-04-12 DIAGNOSIS — F1410 Cocaine abuse, uncomplicated: Secondary | ICD-10-CM | POA: Diagnosis present

## 2021-04-12 DIAGNOSIS — I63411 Cerebral infarction due to embolism of right middle cerebral artery: Principal | ICD-10-CM | POA: Diagnosis present

## 2021-04-12 DIAGNOSIS — I10 Essential (primary) hypertension: Secondary | ICD-10-CM | POA: Insufficient documentation

## 2021-04-12 DIAGNOSIS — E1165 Type 2 diabetes mellitus with hyperglycemia: Secondary | ICD-10-CM | POA: Diagnosis present

## 2021-04-12 DIAGNOSIS — R519 Headache, unspecified: Secondary | ICD-10-CM | POA: Diagnosis present

## 2021-04-12 LAB — COMPREHENSIVE METABOLIC PANEL
ALT: 17 U/L (ref 0–44)
ALT: 18 U/L (ref 0–44)
AST: 22 U/L (ref 15–41)
AST: 18 U/L (ref 15–41)
Albumin: 3.6 g/dL (ref 3.5–5.0)
Albumin: 3.5 g/dL (ref 3.5–5.0)
Alkaline Phosphatase: 86 U/L (ref 38–126)
Alkaline Phosphatase: 80 U/L (ref 38–126)
Anion gap: 11 (ref 5–15)
Anion gap: 10 (ref 5–15)
BUN: 12 mg/dL (ref 6–20)
BUN: 10 mg/dL (ref 6–20)
CO2: 26 mmol/L (ref 22–32)
CO2: 27 mmol/L (ref 22–32)
Calcium: 9 mg/dL (ref 8.9–10.3)
Calcium: 8.9 mg/dL (ref 8.9–10.3)
Chloride: 98 mmol/L (ref 98–111)
Chloride: 100 mmol/L (ref 98–111)
Creatinine, Ser: 0.63 mg/dL (ref 0.44–1.00)
Creatinine, Ser: 0.72 mg/dL (ref 0.44–1.00)
GFR, Estimated: >60 mL/min (ref >60)
GFR, Estimated: >60 mL/min (ref >60)
Glucose, Bld: 366 mg/dL — ABNORMAL HIGH (ref 70–99)
Glucose, Bld: 340 mg/dL — ABNORMAL HIGH (ref 70–99)
Potassium: 3.5 mmol/L (ref 3.5–5.1)
Potassium: 3.5 mmol/L (ref 3.5–5.1)
Sodium: 135 mmol/L (ref 135–145)
Sodium: 137 mmol/L (ref 135–145)
Total Bilirubin: 0.6 mg/dL (ref 0.3–1.2)
Total Bilirubin: 0.9 mg/dL (ref 0.3–1.2)
Total Protein: 7.7 g/dL (ref 6.5–8.1)
Total Protein: 7.2 g/dL (ref 6.5–8.1)

## 2021-04-12 LAB — URINE DRUG SCREEN, QUALITATIVE (ARMC ONLY)
Amphetamines, Ur Screen: NOT DETECTED
Barbiturates, Ur Screen: NOT DETECTED
Benzodiazepine, Ur Scrn: NOT DETECTED
Cannabinoid 50 Ng, Ur ~~LOC~~: POSITIVE — AB
Cocaine Metabolite,Ur ~~LOC~~: POSITIVE — AB
MDMA (Ecstasy)Ur Screen: NOT DETECTED
Methadone Scn, Ur: NOT DETECTED
Opiate, Ur Screen: NOT DETECTED
Phencyclidine (PCP) Ur S: NOT DETECTED
Tricyclic, Ur Screen: NOT DETECTED

## 2021-04-12 LAB — CBC WITH DIFFERENTIAL/PLATELET
Abs Immature Granulocytes: 0.04 10*3/uL (ref 0.00–0.07)
Basophils Absolute: 0.1 10*3/uL (ref 0.0–0.1)
Basophils Relative: 0 %
Eosinophils Absolute: 0.1 10*3/uL (ref 0.0–0.5)
Eosinophils Relative: 1 %
HCT: 50.3 % — ABNORMAL HIGH (ref 36.0–46.0)
Hemoglobin: 15.7 g/dL — ABNORMAL HIGH (ref 12.0–15.0)
Immature Granulocytes: 0 %
Lymphocytes Relative: 22 %
Lymphs Abs: 3.1 10*3/uL (ref 0.7–4.0)
MCH: 26 pg (ref 26.0–34.0)
MCHC: 31.2 g/dL (ref 30.0–36.0)
MCV: 83.3 fL (ref 80.0–100.0)
Monocytes Absolute: 0.7 10*3/uL (ref 0.1–1.0)
Monocytes Relative: 5 %
Neutro Abs: 10 10*3/uL — ABNORMAL HIGH (ref 1.7–7.7)
Neutrophils Relative %: 72 %
Platelets: 262 10*3/uL (ref 150–400)
RBC: 6.04 MIL/uL — ABNORMAL HIGH (ref 3.87–5.11)
RDW: 13.2 % (ref 11.5–15.5)
WBC: 14 10*3/uL — ABNORMAL HIGH (ref 4.0–10.5)
nRBC: 0 % (ref 0.0–0.2)

## 2021-04-12 LAB — URINALYSIS, ROUTINE W REFLEX MICROSCOPIC
Bilirubin Urine: NEGATIVE
Glucose, UA: >=500 mg/dL — AB
Hgb urine dipstick: NEGATIVE
Ketones, ur: NEGATIVE mg/dL
Leukocytes,Ua: NEGATIVE
Nitrite: NEGATIVE
Protein, ur: 100 mg/dL — AB
Specific Gravity, Urine: 1.029 (ref 1.005–1.030)
pH: 6 (ref 5.0–8.0)

## 2021-04-12 LAB — CBC
HCT: 49 % — ABNORMAL HIGH (ref 36.0–46.0)
Hemoglobin: 15.9 g/dL — ABNORMAL HIGH (ref 12.0–15.0)
MCH: 26.8 pg (ref 26.0–34.0)
MCHC: 32.4 g/dL (ref 30.0–36.0)
MCV: 82.6 fL (ref 80.0–100.0)
Platelets: 249 10*3/uL (ref 150–400)
RBC: 5.93 MIL/uL — ABNORMAL HIGH (ref 3.87–5.11)
RDW: 13.2 % (ref 11.5–15.5)
WBC: 15.1 10*3/uL — ABNORMAL HIGH (ref 4.0–10.5)
nRBC: 0 % (ref 0.0–0.2)

## 2021-04-12 LAB — ECHOCARDIOGRAM LIMITED
Calc EF: 61.7 %
Height: 58 in
S' Lateral: 2.5 cm
Single Plane A2C EF: 61.8 %
Single Plane A4C EF: 61.5 %
Weight: 3472 oz

## 2021-04-12 LAB — LIPID PANEL
Cholesterol: 179 mg/dL (ref 0–200)
HDL: 58 mg/dL (ref >40)
LDL Cholesterol: 98 mg/dL (ref 0–99)
Total CHOL/HDL Ratio: 3.1 RATIO
Triglycerides: 116 mg/dL (ref <150)
VLDL: 23 mg/dL (ref 0–40)

## 2021-04-12 LAB — GLUCOSE, CAPILLARY
Glucose-Capillary: 341 mg/dL — ABNORMAL HIGH (ref 70–99)
Glucose-Capillary: 166 mg/dL — ABNORMAL HIGH (ref 70–99)

## 2021-04-12 LAB — MAGNESIUM: Magnesium: 1.9 mg/dL (ref 1.7–2.4)

## 2021-04-12 LAB — ETHANOL: Alcohol, Ethyl (B): <10 mg/dL (ref <10)

## 2021-04-12 LAB — CBG MONITORING, ED
Glucose-Capillary: 406 mg/dL — ABNORMAL HIGH (ref 70–99)
Glucose-Capillary: 313 mg/dL — ABNORMAL HIGH (ref 70–99)

## 2021-04-12 LAB — CK: Total CK: 232 U/L (ref 38–234)

## 2021-04-12 LAB — TROPONIN I (HIGH SENSITIVITY)
Troponin I (High Sensitivity): 20 ng/L — ABNORMAL HIGH (ref <18)
Troponin I (High Sensitivity): 35 ng/L — ABNORMAL HIGH (ref <18)

## 2021-04-12 LAB — PROTIME-INR
INR: 1 (ref 0.8–1.2)
INR: 1 (ref 0.8–1.2)
Prothrombin Time: 12.7 seconds (ref 11.4–15.2)
Prothrombin Time: 13.2 seconds (ref 11.4–15.2)

## 2021-04-12 LAB — MRSA NEXT GEN BY PCR, NASAL: MRSA by PCR Next Gen: NOT DETECTED

## 2021-04-12 LAB — APTT
aPTT: 26 seconds (ref 24–36)
aPTT: 26 seconds (ref 24–36)

## 2021-04-12 LAB — HIV ANTIBODY (ROUTINE TESTING W REFLEX): HIV Screen 4th Generation wRfx: NONREACTIVE

## 2021-04-12 MED ORDER — ACYCLOVIR SODIUM 50 MG/ML IV SOLN
10.0000 mg/kg | Freq: Three times a day (TID) | INTRAVENOUS | Status: DC
Start: 2021-04-12 — End: 2021-04-12
  Filled 2021-04-12: qty 13.3

## 2021-04-12 MED ORDER — LORAZEPAM 2 MG/ML IJ SOLN: 1.0000 mg | Freq: Once | INTRAMUSCULAR | Status: DC

## 2021-04-12 MED ORDER — OXYCODONE-ACETAMINOPHEN 5-325 MG PO TABS
3.0000 | ORAL_TABLET | Freq: Once | ORAL | Status: DC
  Filled 2021-04-12: qty 3

## 2021-04-12 MED ORDER — OXYCODONE HCL 5 MG PO TABS
15.0000 mg | ORAL_TABLET | Freq: Four times a day (QID) | ORAL | Status: DC | PRN
  Administered 2021-04-13 – 2021-04-14 (×5): 15 mg via ORAL
  Filled 2021-04-12 (×6): qty 3

## 2021-04-12 MED ORDER — LABETALOL HCL 5 MG/ML IV SOLN
10.0000 mg | Freq: Once | INTRAVENOUS | Status: AC
  Administered 2021-04-12: 10 mg via INTRAVENOUS
  Filled 2021-04-12: qty 4

## 2021-04-12 MED ORDER — IOHEXOL 350 MG/ML SOLN
80.0000 mL | Freq: Once | INTRAVENOUS | Status: AC | PRN
  Administered 2021-04-12: 80 mL via INTRAVENOUS

## 2021-04-12 MED ORDER — SENNOSIDES-DOCUSATE SODIUM 8.6-50 MG PO TABS
1.0000 | ORAL_TABLET | Freq: Two times a day (BID) | ORAL | Status: DC
  Administered 2021-04-13 (×2): 1 via ORAL
  Filled 2021-04-12 (×2): qty 1

## 2021-04-12 MED ORDER — SODIUM CHLORIDE 0.9 % IV BOLUS
500.0000 mL | Freq: Once | INTRAVENOUS | Status: AC
  Administered 2021-04-12: 500 mL via INTRAVENOUS

## 2021-04-12 MED ORDER — LORAZEPAM 2 MG/ML IJ SOLN
INTRAMUSCULAR | Status: AC
  Filled 2021-04-12: qty 1

## 2021-04-12 MED ORDER — CLEVIDIPINE BUTYRATE 0.5 MG/ML IV EMUL
0.0000 mg/h | INTRAVENOUS | Status: DC
  Administered 2021-04-12: 2 mg/h via INTRAVENOUS
  Filled 2021-04-12 (×3): qty 50

## 2021-04-12 MED ORDER — INSULIN ASPART 100 UNIT/ML IJ SOLN
0.0000 [IU] | Freq: Three times a day (TID) | INTRAMUSCULAR | Status: DC
  Administered 2021-04-12: 15 [IU] via SUBCUTANEOUS
  Administered 2021-04-13: 11 [IU] via SUBCUTANEOUS
  Administered 2021-04-13: 20 [IU] via SUBCUTANEOUS
  Administered 2021-04-13 (×2): 11 [IU] via SUBCUTANEOUS
  Administered 2021-04-14: 20 [IU] via SUBCUTANEOUS
  Administered 2021-04-14: 15 [IU] via SUBCUTANEOUS
  Administered 2021-04-14: 7 [IU] via SUBCUTANEOUS

## 2021-04-12 MED ORDER — SODIUM CHLORIDE 0.9 % IV SOLN: INTRAVENOUS | Status: DC

## 2021-04-12 MED ORDER — LEVETIRACETAM IN NACL 500 MG/100ML IV SOLN
500.0000 mg | Freq: Two times a day (BID) | INTRAVENOUS | Status: DC
  Administered 2021-04-12 – 2021-04-14 (×4): 500 mg via INTRAVENOUS
  Filled 2021-04-12 (×4): qty 100

## 2021-04-12 MED ORDER — CLEVIDIPINE BUTYRATE 0.5 MG/ML IV EMUL
0.0000 mg/h | INTRAVENOUS | Status: DC
  Administered 2021-04-12: 8 mg/h via INTRAVENOUS
  Administered 2021-04-12: 1 mg/h via INTRAVENOUS
  Administered 2021-04-12: 8 mg/h via INTRAVENOUS
  Administered 2021-04-13: 1 mg/h via INTRAVENOUS
  Filled 2021-04-12 (×4): qty 50

## 2021-04-12 MED ORDER — LORAZEPAM 2 MG/ML IJ SOLN
1.0000 mg | Freq: Once | INTRAMUSCULAR | Status: AC
  Administered 2021-04-12: 1 mg via INTRAVENOUS
  Filled 2021-04-12: qty 1

## 2021-04-12 MED ORDER — MORPHINE SULFATE (PF) 4 MG/ML IV SOLN
INTRAVENOUS | Status: AC
  Filled 2021-04-12: qty 2

## 2021-04-12 MED ORDER — OXYCODONE HCL 5 MG PO TABS
15.0000 mg | ORAL_TABLET | Freq: Three times a day (TID) | ORAL | Status: DC | PRN
  Administered 2021-04-12 (×2): 15 mg via ORAL
  Filled 2021-04-12 (×2): qty 3

## 2021-04-12 MED ORDER — LEVETIRACETAM IN NACL 1500 MG/100ML IV SOLN
1500.0000 mg | Freq: Once | INTRAVENOUS | Status: AC
Start: 2021-04-12 — End: 2021-04-12
  Administered 2021-04-12: 1500 mg via INTRAVENOUS
  Filled 2021-04-12: qty 100

## 2021-04-12 MED ORDER — FENTANYL CITRATE PF 50 MCG/ML IJ SOSY
12.5000 ug | PREFILLED_SYRINGE | Freq: Once | INTRAMUSCULAR | Status: AC
  Administered 2021-04-12: 12.5 ug via INTRAVENOUS
  Filled 2021-04-12: qty 1

## 2021-04-12 MED ORDER — ONDANSETRON HCL 4 MG/2ML IJ SOLN: 4.0000 mg | Freq: Four times a day (QID) | INTRAMUSCULAR | Status: DC | PRN

## 2021-04-12 MED ORDER — STROKE: EARLY STAGES OF RECOVERY BOOK
Freq: Once | Status: AC
  Filled 2021-04-12: qty 1

## 2021-04-12 MED ORDER — PANTOPRAZOLE SODIUM 40 MG IV SOLR
40.0000 mg | Freq: Every day | INTRAVENOUS | Status: DC
  Administered 2021-04-12 – 2021-04-13 (×2): 40 mg via INTRAVENOUS
  Filled 2021-04-12 (×2): qty 10

## 2021-04-12 MED ORDER — INSULIN ASPART 100 UNIT/ML IJ SOLN
4.0000 [IU] | Freq: Once | INTRAMUSCULAR | Status: AC
  Administered 2021-04-12: 4 [IU] via INTRAVENOUS
  Filled 2021-04-12: qty 1

## 2021-04-12 MED ORDER — DEXTROSE 5 % IV SOLN
10.0000 mg/kg | Freq: Three times a day (TID) | INTRAVENOUS | Status: DC
Start: 2021-04-12 — End: 2021-04-13
  Administered 2021-04-12 – 2021-04-13 (×3): 660 mg via INTRAVENOUS
  Filled 2021-04-12 (×5): qty 13.2

## 2021-04-12 MED ORDER — OXYCODONE HCL 5 MG PO TABS
15.0000 mg | ORAL_TABLET | Freq: Once | ORAL | Status: AC
  Administered 2021-04-12: 15 mg via ORAL
  Filled 2021-04-12: qty 3

## 2021-04-12 MED ORDER — NICARDIPINE HCL IN NACL 20-0.86 MG/200ML-% IV SOLN
3.0000 mg/h | INTRAVENOUS | Status: DC
  Filled 2021-04-12: qty 200

## 2021-04-12 NOTE — Progress Notes (Signed)
Attempted EEG  patient originally refusing.  RN spoke with pt and she agreed to have EEG done.  Patient became highly irritated, aggressive and started yelling, sitting up and stating she is leaving hospital.Unit staff entered room to help patient calm down.   EEG needed stopped. Will attempt tomorrow if pt is calmer  RN and ordering MD aware of situation. ?

## 2021-04-12 NOTE — ED Notes (Signed)
XRAY  POWERSHARE  WITH  [facility] 

## 2021-04-12 NOTE — ED Notes (Addendum)
This RN and EDT placed new brief and purewick on pt. All monitoring cords in place. Bed alarm activated. New IV established. Pt resting with eyes closed. Respirations even and unlabored. Pt arouses to verbal stimuli. Family at bedside. Call bell within reach  ?

## 2021-04-12 NOTE — Progress Notes (Signed)
Called by RN. Patient irate and threatening to leave AMA. ?I talked to her. Reports feeling stressed about life, family etc. ?I explained why she must stay and the bedside RN and charge had already discussed importance of staying. She was much calmer with me and agrees to stay. Wanted to eat - RN providing her dinner. ?Also wanted pain med Oxycodone to go on home frequency - 15mg  q6h PRN which I did. Also ordered Zofran PRN for nausea. Patient wanted to go to bathroom - I am ok with her walking with assistance to use bathroom as needed. Discussed with bedside RN and patient. ? ?-- ?Amie Portland, MD ?Neurologist ?[facility] ?Pager: [phone] ? ?

## 2021-04-12 NOTE — Progress Notes (Signed)
I was consulted for this patient with small IPH in left temporal lobe, presumed to be hypertensive. CT scan shows a small amount of hemorrhage with no mass effect or hydrocephalus. Given this, no surgery is indicated. Do recommend discussion with neurology as patient is reportedly more sleepy and this could be related to seizure activity. I have asked to check coags and ensure no coagulopathy. She should have SBP < 160.  ?

## 2021-04-12 NOTE — Progress Notes (Signed)
SRN came to perform NIHSS. Exam limited by pt's lack of cooperation. No neurological deficits noted. Pt declined pupil check, is cursing and resisting exam, asking for pain medicine, getting OOB against orders. NIHSS 0. ?

## 2021-04-12 NOTE — Consult Note (Signed)
Neurology Consultation ?Reason for Consult: ICH ?Requesting Physician: Conni Slipper ? ?CC: Seizure, headache ? ?History is obtained from: Chart review, daughter at bedside and care team ? ?HPI: Caitlyn Fuller is a 46 y.o. 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 ?LKW: Unclear at this time ?tPA given?: No, ICH ?Premorbid modified rankin scale:  ?    1 - No significant disability. Able to carry out all usual activities, despite some symptoms. ?  ?ICH Score: 1 ? Time performed: 8:30 AM ?GCS: 13-15 is 0 points ? E3, V4, M6 ?Infratentorial: No.. If yes, 1 point -- 0  ?Volume: <30cc is 0 points  ?Age: 46 y.o.. >80 is 1 point -- 0  ?Intraventricular extension is 1 point ? ?A Score of 1 points has a 30 day mortality of 13%. Stroke. 2001 Apr;32(4):891-7. ? ?ROS: Unable to obtain due to altered mental status.  Obtained from family as able, they denied any recent infectious symptoms, notes that she has not been to a doctor in some time as her primary care office closed and she has not found another doctor.  They note she does have significant swelling in her hands and feet frequently but does lay flat to sleep.  She has also had recurrent rash in the right calf that they believe is shingles, unsure when she last had an outbreak though she has some healed skin in that area.  They are also concerned about some knots on her head ? ?Past Medical History:  ?Diagnosis Date  ? Asthma   ? Diabetes mellitus without complication ([place])   ? Hernia   ? Hypertension   ? Kidney infection   ? Shingles   ? ?Past Surgical History:  ?Procedure Laterality Date  ? ABDOMINAL SURGERY    ? CESAREAN SECTION    ? CHOLECYSTECTOMY    ? colonscopy    ? TUBAL LIGATION    ? ? ?No family  history on file. ? ?Social History:  reports that she has been smoking cigarettes. She has been smoking an average of .5 packs per day. She has never used smokeless tobacco. She reports that she does not drink alcohol and does not use drugs. UDS Cocaine and THC positive ? ? ?Exam: ?Current vital signs: ?BP (!) 159/93   Pulse (!) 101   Temp 98.9 ?F (37.2 ?C) (Oral)   Resp (!) 24   Ht 4\' 10"  (1.473 m)   Wt 98.4 kg   SpO2 94%   BMI 45.35 kg/m?  ?Vital signs in last 24 hours: ?Temp:  [98.9 ?F (37.2 ?C)] 98.9 ?F (37.2 ?C) (04/04 0449) ?Pulse Rate:  [90-120] 101 (04/04 0800) ?Resp:  [14-26] 24 (04/04 0646) ?BP: (146-199)/(84-150) 159/93 (04/04 0800) ?SpO2:  [93 %-100 %] 94 % (04/04 0800) ?Weight:  [98.4 kg] 98.4 kg (04/04 0447) ? ? ?Physical Exam  ?Constitutional: Appears obese, chronically ill ?Psych: Somnolent but cooperative on my evaluation, agitated overnight ?Eyes: No scleral injection. Not photophobic ?HENT: No oropharyngeal obstruction, neck is very supple ?MSK: no joint deformities.  ?Cardiovascular: Normal rate and regular rhythm. Perfusing extremities well ?Respiratory: Effort normal, non-labored breathing ?GI: Soft.  No distension. There is no tenderness.  Hernia does not have evidence of incarceration at this time ?Skin: She does have some healing skin lesions  on the right calf ? ?Neuro: ?Mental Status: ?Patient is somnolent but with repeated gentle tactile stimulation awakens and follows simple commands ?No clear evidence of neglect, language evaluation limited by her somnolence but she is able to tell me that she hurts everywhere ?Cranial Nerves: ?II: Visual Fields difficult to assess given somnolence. Pupils are equal, round, and reactive to light.  2 mm to 1.5 mm ?III,IV, VI: Orients to examiner on both sides ?V: Facial sensation is symmetric to eyelash brush ?VII: Facial movement is grossly symmetric when she grimaces as she moves around.  ?VIII: hearing is intact to voice ?Sensory/motor: ?Equally  reactive to light tickle in all 4 extremities.  Able to maintain bilateral arms antigravity for 10 seconds although they both do drift slightly, right little more so than the left ?Deep Tendon Reflexes: ?2+ and symmetric in the brachioradialis and patellae.  ?Plantars: ?Toes are downgoing bilaterally.  ?Cerebellar: ?Unable to assess secondary to patient's mental status  ?Gait:  ?Unable to assess secondary to patient's mental status  ? ?NIHSS total 11 ?Score breakdown: One-point for drowsiness, one-point for inability to answer month, one-point for intermittently not following commands, one-point for left upper extremity weakness, one-point for right upper extremity weakness, 2 points for left lower extremity weakness, 2 points for right lower extremity weakness, one-point for dysarthria, one-point for mild aphasia ?Performed at 8:45 AM ? ? ?I have reviewed labs in epic and the results pertinent to this consultation are: ? ?Basic Metabolic Panel: ?Recent Labs  ?Lab 04/12/21 ?0451  ?NA 135  ?K 3.5  ?CL 98  ?CO2 26  ?GLUCOSE 366*  ?BUN 12  ?CREATININE 0.63  ?CALCIUM 9.0  ? ?Lab Results  ?Component Value Date  ? HGBA1C 13.5 (H) 11/02/2014  ?Glucoses historically in the 400-500 ranges, last May 2020 ?  ? ?CBC: ?Recent Labs  ?Lab 04/12/21 ?0451  ?WBC 14.0*  ?NEUTROABS 10.0*  ?HGB 15.7*  ?HCT 50.3*  ?MCV 83.3  ?PLT 262  ? ? ?Coagulation Studies: ?No results for input(s): LABPROT, INR in the last 72 hours.  ? ? ?UDS cocaine and TCH positive, family was notified about this by prior provider And were not previously aware of substance use ? ?I have reviewed the images obtained: ?Head CT 5 AM 4/4 -- 1 cm left temporal lobe hemorrhage, slight extension into left lateral ventricle, no hydrocephalus; chronic microvascular disease ?CTA negative for vascular malformation or aneurysm ?Repeat head CT 8:15 AM for agitation/somnolence motion limited but grossly stable on my read ? ?Impression: Left temporal lobe hemorrhage. Location and  CTA not suggestive of an underlying vascular malformation.  Possible hypertensive bleed in the setting of cocaine use versus cocaine induced vasculopathy, press/RCVS is also on the differential given her cocaine and THC use.  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 ?Recommendations: ?- Strict blood pressure control systolic blood pressure less than 140 ?- Please avoid any hypotonic fluids, so far she has received 500 cc overnight and I ordered an additional 500 cc of normal saline. ?- Gradual correction of her glucose as this can also lower seizure threshold, orders per ED / primary team ?- Magnesium level ordered and pending, please supplement if needed ?- EEG ?- Acyclovir, menigitic dosing. Hold off on tap right now given acuity of bleed and intraventricular extension ?-  CK to assess for substance/seizure induced rhabdo ?- Neurology will follow along ? ?Lesleigh Noe MD-PhD ?[facility] ?[phone] ?[facility] coverage for [facility] is from 8 AM to 4 AM in-house and 4 PM to 8 PM by telephone/video. 8 PM to 8 AM emergent questions or overnight urgent questions should be addressed to Teleneurology On-call or [facility] neurohospitalist; contact information can be found on AMION ? ?Total critical care time: 50 minutes ?  ?Critical care time was exclusive of separately billable procedures and treating other patients. ?  ?Critical care was necessary to treat or prevent imminent or life-threatening deterioration. ?  ?Critical care was time spent personally by me on the following activities: development of treatment plan with patient and/or surrogate as well as nursing, discussions with consultants/primary team, evaluation of patient's response to treatment, examination of patient, obtaining history from  patient or surrogate, ordering and performing treatments and interventions, ordering and review of laboratory studies, ordering and review of radiographic studies, and re-evaluation of patient's condition as n

## 2021-04-12 NOTE — ED Notes (Signed)
MD at bedside. Pt continues to be agitated and restless. Pt refused ativan. Pt given percocet as requested. Pt continually stating, "I want to get out of bed, I want to leave." Pt continues to take oxygen off and this RN still unable to get an accurate BP.  ?

## 2021-04-12 NOTE — Progress Notes (Signed)
Pt currently unavailable for EEG. Pt is currently having an Echo. Will attempt at a later time when schedule permits ?

## 2021-04-12 NOTE — H&P (Addendum)
Admission H&P ?   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 ?HPI: Caitlyn Fuller is an 46 y.o. female with a history of diabetes, asthma,hypertension and chronic pain on oxycodone. The patient was most recently seen in the ED for scalp lesions in August of last year. She was then admitted to [facility] for abscess/cellulitis of the scalp requiring IV antibiotics. ? ?Today she presented to [facility] as a transfer from [facility] with an apparent witnessed seizure by family/friends that lasted about 10 mins per their report. The family describes episode as jerking, shaking movements with eyes rolling back into head and then she lost of control of her bladder. Patient denies hx of seizures and no report of seizure activity during EMS transport. EMS reported patient altered and postictal in ambulance. She was extremely hypertensive on arrival to the [facility] ED and was started on clevidipine gtt. She was loaded with Keppra in the [facility] ED. She was subsequently transferred to [facility] for further management.  ?. ?Of note, while still in the [facility] ED, the patient became more agitated, anxious appearing, and confrontational with staff. She was primarily concerned about withdrawal from chronic opiate pain medication. She is on 15 mg oxycodone every 8 hours at home. ? ?Upon arrival to [facility] ICU room, nurse reported patient was lethargic but arousal to voice, but not cooperative with exam. The patient is now more alert.  She reports a headache and pain all over. She denies other acute complaints.   ? ?Past Medical History:  ?Diagnosis Date  ? Asthma   ? Diabetes mellitus without complication (HCC)   ? Hernia   ? Hypertension   ? Kidney infection   ? Shingles   ? ? ?Past Surgical History:  ?Procedure Laterality Date  ? ABDOMINAL SURGERY    ? CESAREAN SECTION    ? CHOLECYSTECTOMY    ? colonscopy    ? TUBAL LIGATION    ? ? ?No family history  on file. ?Social History:  reports that she has been smoking cigarettes. She has been smoking an average of .5 packs per day. She has never used smokeless tobacco. She reports that she does not drink alcohol and does not use drugs. ? ?Allergies:  ?Allergies  ?Allergen Reactions  ? Gabapentin Anaphylaxis  ? Morphine And Related Anaphylaxis  ? Darvocet [Propoxyphene N-Acetaminophen] Hives  ? Toradol [Ketorolac Tromethamine] Other (See Comments)  ?  migraines  ? Ultram [Tramadol] Hives  ? Vancomycin Itching  ? ? ?Medications Prior to Admission  ?Medication Sig Dispense Refill  ? albuterol (PROVENTIL HFA;VENTOLIN HFA) 108 (90 Base) MCG/ACT inhaler Inhale 2 puffs into the lungs every 6 (six) hours as needed for wheezing or shortness of breath. 1 Inhaler 2  ? albuterol (PROVENTIL) (2.5 MG/3ML) 0.083% nebulizer solution Take 3 mLs (2.5 mg total) by nebulization every 4 (four) hours as needed for wheezing or shortness of breath. 75 mL 0  ? cephALEXin (KEFLEX) 500 MG capsule Take 1 capsule (500 mg total) by mouth 3 (three) times daily. 21 capsule 0  ? cyclobenzaprine (FLEXERIL) 10 MG tablet Take 1 tablet (10 mg total) 3 (three) times daily as needed by mouth for muscle spasms. 12 tablet 0  ? dicyclomine (BENTYL) 20 MG tablet Take 1 tablet (20 mg total) by mouth every 6 (six) hours as needed. 20 tablet 0  ? etodolac (LODINE) 200 MG capsule Take 1 capsule (200 mg total) by mouth every 8 (  eight) hours. (Patient not taking: Reported on 04/07/2016) 12 capsule 0  ? famotidine (PEPCID) 40 MG tablet Take 1 tablet (40 mg total) by mouth every evening. 30 tablet 1  ? furosemide (LASIX) 20 MG tablet Take 1 tablet (20 mg total) by mouth daily. 7 tablet 0  ? HYDROcodone-acetaminophen (NORCO/VICODIN) 5-325 MG tablet Take 1 tablet by mouth every 6 (six) hours as needed for moderate pain. 10 tablet 0  ? Insulin Detemir (LEVEMIR FLEXTOUCH) 100 UNIT/ML Pen Inject 24 Units into the skin every morning AND 24 Units at bedtime. 19.8 mL 3  ?  insulin NPH-regular Human (NOVOLIN 70/30) (70-30) 100 UNIT/ML injection Inject 38-48 Units into the skin 2 (two) times daily with a meal. Inject 48 units under the skin in the morning and 38 units under the skin at night 10 mL 0  ? lisinopril (PRINIVIL,ZESTRIL) 10 MG tablet Take 10 mg by mouth daily.    ? naproxen (NAPROSYN) 500 MG tablet Take 1 tablet (500 mg total) by mouth 2 (two) times daily with a meal. (Patient not taking: Reported on 04/07/2016) 20 tablet 2  ? ondansetron (ZOFRAN ODT) 4 MG disintegrating tablet Take 1 tablet (4 mg total) by mouth every 8 (eight) hours as needed for nausea or vomiting. 20 tablet 0  ? oxyCODONE (ROXICODONE) 15 MG immediate release tablet Take 1 tablet (15 mg total) every 8 (eight) hours as needed by mouth for pain. 15 tablet 0  ? oxyCODONE-acetaminophen (PERCOCET) 5-325 MG tablet Take 2 tablets by mouth every 6 (six) hours as needed for severe pain. 16 tablet 0  ? sucralfate (CARAFATE) 1 g tablet Take 1 tablet (1 g total) by mouth 4 (four) times daily. 60 tablet 0  ? ? ?ROS: ? ?Constitutional Denies weight loss, fever and chills.   ?HEENT Denies changes in vision and hearing.   ?Respiratory Denies SOB and cough.   ?CV Denies palpitations and CP   ?GI Denies abdominal pain, nausea, vomiting and diarrhea.   ?GU Denies dysuria and urinary frequency.   ?MSK Denies myalgia and joint pain.   ?Skin Redness to buttocks and back. Old Shingles scars on right leg  ?Neurological Denies headache and syncope.   ?Psychiatric Endorses anxiety   ? ? ?Physical Examination: ?Blood pressure 132/71, pulse 96, resp. rate 16, SpO2 96 %. ? ?HEENT-  Normocephalic, no lesions, without obvious abnormality.  Normal external eye and conjunctiva.  Normal TM's bilaterally.  Normal auditory canals and external ears. Normal external nose, mucus membranes and septum.  Normal pharynx. ?Neck supple with no masses, nodes, nodules or enlargement. ?Cardiovascular - regular rate and rhythm, S1, S2 normal, no murmur,  click, rub or gallop ?Lungs - chest clear, no wheezing, rales, normal symmetric air entry, Heart exam - S1, S2 normal, no murmur, no gallop, rate regular ?Abdomen -  hernia noted, large rounded abdomen ?Extremities - negative findings: no erythema, induration, or nodules, no evidence of joint effusion, ROM of all joints is normal, strength normal, and no deformities present ? ?Neurologic Examination: ?Mental Status: ?Orientation: Alert and Oriented to person, place, time, and situation  ?Attention/Concentration: Intact ?Fund of Knowledge: Intact ?Language: Intact fluency, Intact comprehension, Intact repetition, Intact naming, and Intact reading ?Speech: Fluent, Without Dysarthria, and Normal Prosody   ? ?Cranial Nerves:  ?Pupils: Equal, OD Pupil 2 mm, OS Pupil 2 mm, and Reactive  ?Visual Fields: (R) Full to confrontation; (L) Full to confrontation ?Optic Disc: Poorly visualized  ?CN III, IV, VI (Extra-Ocular Movements): EOMI, No nystagmus, and  Normal saccades ?CN V: Normal sensation in V1, V2, V3 bilaterally ?CN VII: Normal, symmetric facial muscle strength bilaterally ?CN VIII: Auditory acuity intact to bedside testing ?CN IX/X: Normal palate elevation ?CN XI: Normal strength of bilateral trapezius and SCM muscles  ?CN XII: Full strength B/L with tongue-in-cheek testing ? ?Motor:  ?Strength: 5/5 x 4 extremities ?Bulk:: Normal with no atrophy or fasciculations  ?Tone: Normal in the upper and lower extremities ?Involuntary Movements (asterixis, tremor, etc): Absent  ?Pronator Drift: Absent ? ?Sensory: ?Light Touch: Intact in all 4 extremities  ?No extinction to double simultaneous stimulation   ? ?Reflexes:  ?                            Right     Left  ?Biceps                              2/4                2/4 ?Triceps        2/4                 2/4 ?Brachioradialis       2/4                 2/4 ?Patella        2/4                 2/4 ?Achilles        2/4                 2/4 ?Plantar Response  Downgoing  Downgoing   ? ?Coordination: ?Finger to Nose: No dysmetria ?Heel to Shin: No dysmetria ? ?Gait: ?Not assessed in the emergent setting  ? ?Results for orders placed or performed during the hospital encounter of 04/12/21

## 2021-04-12 NOTE — ED Notes (Signed)
Report given to [facility]. 

## 2021-04-12 NOTE — Progress Notes (Signed)
OT Cancellation Note ? ?Patient Details ?Name: Caitlyn Fuller ?MRN: [id number] ?DOB: [birth year] ? ? ?Cancelled Treatment:    Reason Eval/Treat Not Completed: Other (comment) (RN request hold as pt aggitated and resting. Will return as schedule allows.) ? ?Caitlyn Fuller ?Caitlyn Fuller MSOT, OTR/L ?Acute Rehab ?Pager: [phone] ?Office: [phone] ?04/12/2021, 3:28 PM ?

## 2021-04-12 NOTE — ED Triage Notes (Signed)
Witnessed seizure activity by family, lasting apx 10 mins. EMS reports family described as jerking, shaking movements with eyes rolling back into head. Lost control of bladder. No hx of seizures, no seizure activity during EMS transport. Upon arrival to room, pt lethargic but arousal to voice, not answering questions appropriately. Provider at bedside.  ?

## 2021-04-12 NOTE — Progress Notes (Signed)
Dr Otelia Limes notified of patient more drowsy. Patient did receive pain medication at 1406.NIH score 0 when performed. Rapid response came to bedside to due an additional NIH getting score of 0 as well. MRI order changed to STAT and spoke with Radiology tech and they stated a scanner would be available at 1900. Orders received to transfer patient to 4N ICU. ?

## 2021-04-12 NOTE — ED Notes (Signed)
[facility]  CALLED PER  DR  MALINDA MD 

## 2021-04-12 NOTE — ED Notes (Signed)
Pt accepted to [facility] per Delice Bison, Nurse, adult. Call [phone] for report. [facility] to transport pt after shift change. ?

## 2021-04-12 NOTE — Progress Notes (Signed)
Inpatient Diabetes Program Recommendations ? ?AACE/ADA: New Consensus Statement on Inpatient Glycemic Control (2015) ? ?Target Ranges:  Prepandial:   less than 140 mg/dL ?     Peak postprandial:   less than 180 mg/dL (1-2 hours) ?     Critically ill patients:  140 - 180 mg/dL  ? ?Lab Results  ?Component Value Date  ? GLUCAP 313 (H) 04/12/2021  ? HGBA1C 13.5 (H) 11/02/2014  ? ? ?Review of Glycemic Control ? Latest Reference Range & Units 04/12/21 08:38 04/12/21 11:11  ?Glucose-Capillary 70 - 99 mg/dL 106 (H) 269 (H)  ?(H): Data is abnormally high ? ?Diabetes history: DM2 ?Outpatient Diabetes medications: Levemir 24 units bid ?Current orders for Inpatient glycemic control: Novolog correction tid 0-20 units ? ?Inpatient Diabetes Program Recommendations:   ?Pending A1c. ? ?-Add Levemir 12 units bid (50% home insulin dose) ?-Add Novolog correction 0-5 units hs ? ?Thank you, ?Billy Fischer Yacqub Baston, RN, MSN, CDE  ?Diabetes Coordinator ?Inpatient Glycemic Control Team ?Team Pager [phone] (8am-5pm) ?04/12/2021 1:28 PM ? ? ? ? ? ?

## 2021-04-12 NOTE — ED Notes (Addendum)
Neurology at bedside.

## 2021-04-12 NOTE — ED Notes (Signed)
Md Cinda Quest able to assist pt back into bed. This RN and EDT attempting to place pt back on monitoring cords and obtain and accurate BP.  ?

## 2021-04-12 NOTE — Progress Notes (Signed)
Pharmacy Antibiotic Note ? ?Caitlyn Fuller is a 46 y.o. 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  Pharmacy has been consulted for acyclovir dosing. ? ?Plan: ?Acyclovir 665 mg (10 mg/kg) Q8H with NS @ 125 ml/hr ? ?Height: 4\' 10"  (147.3 cm) ?Weight: 98.4 kg (217 lb) ?IBW/kg (Calculated) : 40.9 ?Adjusted body weight: 66.6 kg ? ?Temp (24hrs), Avg:98.9 ?F (37.2 ?C), Min:98.9 ?F (37.2 ?C), Max:98.9 ?F (37.2 ?C) ? ?Recent Labs  ?Lab 04/12/21 ?0451  ?WBC 14.0*  ?CREATININE 0.63  ?  ?Estimated Creatinine Clearance: 88.6 mL/min (by C-G formula based on SCr of 0.63 mg/dL).   ? ?Allergies  ?Allergen Reactions  ? Gabapentin Anaphylaxis  ? Morphine And Related Anaphylaxis  ? Darvocet [Propoxyphene N-Acetaminophen] Hives  ? Toradol [Ketorolac Tromethamine] Other (See Comments)  ?  migraines  ? Ultram [Tramadol] Hives  ? Vancomycin Itching  ? ? ?Antimicrobials this admission: ?4/4 acyclovir >>  ? ? ?Thank you for allowing pharmacy to be a part of this patient?s care. ? ?6/4 [name] ?04/12/2021 10:17 AM ? ?

## 2021-04-12 NOTE — ED Notes (Signed)
Pt placed on 4L [place] at this time due to oxygen saturation 88% on 2L [place]. Pt currently resting with eyes closed, respirations even and unlabored. ?

## 2021-04-12 NOTE — Progress Notes (Signed)
Attempted to call MRI to see about bringing patient down was unable to get in touch with anyone. Will attempt to call again shortly. ?

## 2021-04-12 NOTE — ED Notes (Signed)
Pt in room with girlfriend, visibly upset, tearful, yelling and cursing. Unable to sit still on stretcher. Repeatedly asking for pain medication, stating she is withdrawing from Percocet and has not had one in 12 hours. Attempts to educate pt on her critical condition and need for accurate BP readings, that she cannot get out of bed. Pt states " I dont care if I die." Pt not redirectable, continues to talk over this nurse, and Lorriane Shire, RN as continued attempts are made to educate pt. Pt continues to try to get up from stretcher despite education that doing so is dangerous and at risk to her safety.  ? ?

## 2021-04-12 NOTE — ED Notes (Signed)
CBG:  406 

## 2021-04-12 NOTE — Progress Notes (Signed)
On Call Neurologist note ? ?Called by Dr. Marisa Severin at [facility]. Pt with seizures, no prior history, brought in for eval. Exam now AAOx4 with no focal deficits. Was extremely HTN on arrival. [facility] with left temporal ICH with small IVH. I have recommended a CTA head/neck as she awaits transfer. ?Cleviprex for BP control for SBP <140. ?Accepted to neurology service in [facility] ICU (No neuro ICU beds available at this time and none expected in reasonable time). ?Do not see a need for emergent NSGY intervention.  ?Repeat CTH in about 6-8 h for stability. ?Neurology team at [facility] will evaluate and put admission orders once at [facility]. ?Discussed on 3-way call with Dr. Marisa Severin via [facility]. ? ?-- ?Milon Dikes, MD ?Neurologist ?[facility] ?Pager: [phone] ? ?

## 2021-04-12 NOTE — ED Notes (Addendum)
Pt transported to CT by this RN and EDT  ?

## 2021-04-12 NOTE — ED Notes (Addendum)
This RN for bedside report from [place], Therapist, sports. Pt sitting up in bed talking aggressive to staff and cussing. Pt continually stating, "I need my pain medication." RN explaining pt's critical situation and life threatening injuries including death. Pt stating, "I don't care if I die." MD at bedside attempting to deescalate situation.  ?

## 2021-04-12 NOTE — ED Notes (Signed)
Pt to CT via stretcher at this time with monitoring in use ?

## 2021-04-12 NOTE — ED Provider Notes (Signed)
? ?[facility] ?Provider Note ? ? ? Event Date/Time  ? First MD Initiated Contact with Patient 04/12/21 [phone]   ?  (approximate) ? ? ?History  ? ?Seizures ? ? ?HPI ? ?Caitlyn Fuller is a 46 y.o. female with a history of diabetes, asthma, and hypertension who presents with an apparent seizure.  Per EMS, the patient was with some friends who witnessed generalized seizure activity for several minutes.  During the entire transportation, the patient was altered and postictal.  However they did not witness any additional seizure activity.  The patient is now more alert.  She reports a headache and pain all over.  She denies other acute complaints.  She has no prior history of seizures. ? ? ? ? ?Physical Exam  ? ?Triage Vital Signs: ?ED Triage Vitals  ?Enc Vitals Group  ?   BP 04/12/21 0449 (!) 199/131  ?   Pulse Rate 04/12/21 0449 (!) 115  ?   Resp 04/12/21 0449 20  ?   Temp 04/12/21 0449 98.9 ?F (37.2 ?C)  ?   Temp Source 04/12/21 0449 Oral  ?   SpO2 04/12/21 0449 93 %  ?   Weight 04/12/21 0447 217 lb (98.4 kg)  ?   Height 04/12/21 0447 4\' 10"  (1.473 m)  ?   Head Circumference --   ?   Peak Flow --   ?   Pain Score 04/12/21 0446 0  ?   Pain Loc --   ?   Pain Edu? --   ?   Excl. in GC? --   ? ? ?Most recent vital signs: ?Vitals:  ? 04/12/21 0642 04/12/21 0646  ?BP: (!) 146/109 (!) 162/84  ?Pulse: (!) 102 (!) 104  ?Resp: 15 (!) 24  ?Temp:    ?SpO2: 98% 96%  ? ? ? ?General: Alert, oriented x4, uncomfortable appearing but in no acute distress. ?CV:  Good peripheral perfusion.  Normal heart sounds. ?Resp:  Normal effort.  Lungs CTAB. ?Abd:  No distention.  ?Other:  EOMI.  PERRLA.  Motor intact in all extremities.  Normal coordination. ? ? ?ED Results / Procedures / Treatments  ? ?Labs ?(all labs ordered are listed, but only abnormal results are displayed) ?Labs Reviewed  ?COMPREHENSIVE METABOLIC PANEL - Abnormal; Notable for the following components:  ?    Result Value  ? Glucose, Bld 366 (*)   ? All  other components within normal limits  ?CBC WITH DIFFERENTIAL/PLATELET - Abnormal; Notable for the following components:  ? WBC 14.0 (*)   ? RBC 6.04 (*)   ? Hemoglobin 15.7 (*)   ? HCT 50.3 (*)   ? Neutro Abs 10.0 (*)   ? All other components within normal limits  ?URINALYSIS, ROUTINE W REFLEX MICROSCOPIC - Abnormal; Notable for the following components:  ? Color, Urine YELLOW (*)   ? APPearance CLEAR (*)   ? Glucose, UA >=500 (*)   ? Protein, ur 100 (*)   ? Bacteria, UA RARE (*)   ? All other components within normal limits  ?TROPONIN I (HIGH SENSITIVITY) - Abnormal; Notable for the following components:  ? Troponin I (High Sensitivity) 20 (*)   ? All other components within normal limits  ?ETHANOL  ?URINE DRUG SCREEN, QUALITATIVE ([facility] ONLY)  ?TROPONIN I (HIGH SENSITIVITY)  ? ? ? ?EKG ? ?ED ECG REPORT ?I06/04/23, the attending physician, personally viewed and interpreted this ECG. ? ?Date: 04/12/2021 ?EKG Time: 0450 ?Rate: 115 ?Rhythm: Sinus tachycardia ?  QRS Axis: normal ?Intervals: normal ?ST/T Wave abnormalities: Nonspecific ST abnormalities ?Narrative Interpretation: Nonspecific abnormalities with no evidence of acute ischemia ? ? ? ?RADIOLOGY ? ?CT head: I independently viewed and interpreted the images; there is a small left temporal lobe hemorrhage with no midline shift ? ?PROCEDURES: ? ?Critical Care performed: Yes, see critical care procedure note(s) ? ?.Critical Care ?Performed by: Dionne Bucy, MD ?Authorized by: Dionne Bucy, MD  ? ?Critical care provider statement:  ?  Critical care time (minutes):  30 ?  Critical care was necessary to treat or prevent imminent or life-threatening deterioration of the following conditions:  CNS failure or compromise ?  Critical care was time spent personally by me on the following activities:  Development of treatment plan with patient or surrogate, discussions with consultants, evaluation of patient's response to treatment, examination of  patient, ordering and review of laboratory studies, ordering and review of radiographic studies, ordering and performing treatments and interventions, pulse oximetry, re-evaluation of patient's condition and review of old charts ?  Care discussed with: admitting provider   ? ? ?MEDICATIONS ORDERED IN ED: ?Medications  ?clevidipine (CLEVIPREX) infusion 0.5 mg/mL (2 mg/hr Intravenous New Bag/Given 04/12/21 0611)  ?LORazepam (ATIVAN) injection 1 mg (has no administration in time range)  ?oxyCODONE (Oxy IR/ROXICODONE) immediate release tablet 15 mg (has no administration in time range)  ?LORazepam (ATIVAN) 2 MG/ML injection (has no administration in time range)  ?sodium chloride 0.9 % bolus 500 mL (0 mLs Intravenous Stopped 04/12/21 0552)  ?labetalol (NORMODYNE) injection 10 mg (10 mg Intravenous Given 04/12/21 0502)  ?levETIRAcetam (KEPPRA) IVPB 1500 mg/ 100 mL premix (0 mg Intravenous Stopped 04/12/21 0640)  ?iohexol (OMNIPAQUE) 350 MG/ML injection 80 mL (80 mLs Intravenous Contrast Given 04/12/21 0624)  ?LORazepam (ATIVAN) injection 1 mg (1 mg Intravenous Given 04/12/21 0973)  ? ? ? ?IMPRESSION / MDM / ASSESSMENT AND PLAN / ED COURSE  ?I reviewed the triage vital signs and the nursing notes. ? ?46 year old female with PMH as noted above presents with a witnessed generalized seizure with subsequent postictal period, now alert and reporting pain all over.  She has no prior seizure history. ? ?I reviewed the past medical records.  The patient was most recently seen in the ED for scalp lesions in August of last year.  She was then admitted to [facility] for abscess/cellulitis of the scalp requiring IV antibiotics. ? ?On exam currently, the patient is alert and oriented x4.  She is significantly hypertensive and somewhat tachycardic with otherwise normal vital signs.  Neurologic exam is nonfocal. ? ?Differential diagnosis includes, but is not limited to, epileptic seizure, pseudoseizure, intracranial hemorrhage, hypoglycemia (although this  is less likely given that EMS glucose was in the 300s), electrolyte abnormality or other metabolic disturbance, or less likely cardiac cause. ? ?We will obtain CT head, lab work-up, give IV labetalol, fluids, and reassess. ? ?The patient is on the cardiac monitor to evaluate for evidence of arrhythmia and/or significant heart rate changes. ? ?----------------------------------------- ?5:55 AM on 04/12/2021 ?----------------------------------------- ? ?CT shows a small left temporal lobe hemorrhage.  The patient remains significantly hypertensive.  I have ordered a nicardipine drip as well as loading with IV Keppra.  I have placed a call to [facility] to discuss with neurology and arrange transfer to [facility]. ? ?----------------------------------------- ?6:31 AM on 04/12/2021 ?----------------------------------------- ? ?The patient has been accepted for transfer to [facility] by Dr. Wilford Corner.  He recommended Cleviprex infusion instead of nicardipine and requested CT angio of the head  and neck.  [facility].  The patient's family member asked about possible transfer to [facility], however since [facility] patient is already accepted to [facility] and a bed is available, I recommended that we proceed with transfer to [facility] since this would give the patient the most expedited and timely care rather than trying to start the process over again with a different facility..  They agreed. ? ?Blood pressure has improved.  The patient remains alert and oriented.  She is stable for transfer at this time. ? ?----------------------------------------- ?7:14 AM on 04/12/2021 ?----------------------------------------- ? ?Patient has become more agitated, anxious appearing, and confrontational with staff, so I initially ordered a dose of Ativan.  She is primarily concerned about withdrawal from chronic opiate pain medication.  She is on 15 mg oxycodone every 8 hours, confirmed on prior charts.  I have ordered a dose  for now.  We have attempted to verbally redirect the patient in order to help her calm down, as this will help keep her blood pressure under control. ? ?The patient is still stable for transfer at this t

## 2021-04-12 NOTE — ED Provider Notes (Addendum)
Called to see patient because she is agitated and restless.  Patient says she wants to leave.  She has pulled her 1 IV out she is got out of the bed we will have to monitor leads off and taken off the pure wick.  She is saying she wants to pee does not want to use the pure wick.  She says she is usually gets 30 mg of Percocet around-the-clock and she only got 15 and she cannot stand it.  She has gotten Ativan now.  Patient was able to be talked  back into the bed where she continually tries to get up and lays back down and gets up and down and up and down thrashing around in the bed complaining of her legs hurting and saying she needs more Percocet.  I offered her morphine which she initially wanted and then said no I am allergic to it about the time the morphine came in the room.   Patient is allergic to it so that was stopped.  Patient asked for more Percocet repeatedly I ordered some more Percocet but now she is very sleepy blood pressure is 159 systolic which is better than it had been but she will wake up and answer questions but is again much more sleepy than she was.  I will get another CT scan to make sure that she has not had a worse brain bleed we will do this stat. ?  ?Arnaldo Natal, MD ?04/12/21 [phone] ? ?  ?Arnaldo Natal, MD ?04/12/21 [phone] ?CT is now done looks somewhat worse to me I am going to consult our neurosurgeon quickly since I believe they are here. ?  ?Arnaldo Natal, MD ?04/12/21 [phone] ?I have spoke with Dr. Adriana Simas our neurosurgeon who reviewed the films.  He feels that there is some slight worsening of the intracranial hemorrhage but nothing that significant or worrisome.  Neurology then came and saw the patient as well.  She is arranging an EEG but if we can get the patient out before the EEG is done that would be fine with her.  Is a possibility that she may have had a seizure.  Patient is still very sleepy but will wake up briefly if I yell at her.  Daughter had asked me to try to  transfer the patient to [facility].  I have called [facility] but neurosurgery there is in surgery and will be for some time yet and neurology has been paged but has not called back its been almost half an hour at this point.  I went back and let the daughter know that I have not got a return call and the daughter is okay now with going to [facility].  We are having [facility] come back as they have left from the patient was to agitated to get in the ambulance.  Patient's blood pressure is now 136/68 on the Cleviprex.  She appears to be stable.  Her sats are good pulse rate is good temperature is good etc. ?  ?Arnaldo Natal, MD ?04/12/21 0950 ?----------------------------------------- ?10:03 AM on 04/12/2021 ?----------------------------------------- ?Lucienne Minks does call back now they are very very short of beds and recommended keeping her going to [facility] as planned.  If [facility] can handle it for any reason then they will take her but if at all possible she should go there. ?  ?Arnaldo Natal, MD ?04/12/21 1003 ?Ambulance is now 15 minutes out.  Blood pressure has trended upwards is now 150s to 160s.  Will increase the Cleviprex a little bit. ?  ?Arnaldo Natal, MD ?04/12/21 1051 ? ?

## 2021-04-12 NOTE — Progress Notes (Signed)
Pharmacy Antibiotic Note ? ?Caitlyn Fuller is a 46 y.o. 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  Pharmacy has been consulted for acyclovir dosing. ? ?Plan: ?-Acyclovir 10mg /kg (AdBW ~66kg) q8h ? ?  ?Adjusted body weight: 66.6 kg ? ?Temp (24hrs), Avg:98.4 ?F (36.9 ?C), Min:97.9 ?F (36.6 ?C), Max:98.9 ?F (37.2 ?C) ? ?Recent Labs  ?Lab 04/12/21 ?0451  ?WBC 14.0*  ?CREATININE 0.63  ? ?  ?Estimated Creatinine Clearance: 88.6 mL/min (by C-G formula based on SCr of 0.63 mg/dL).   ? ?Allergies  ?Allergen Reactions  ? Gabapentin Anaphylaxis  ? Morphine And Related Anaphylaxis  ? Darvocet [Propoxyphene N-Acetaminophen] Hives  ? Toradol [Ketorolac Tromethamine] Other (See Comments)  ?  migraines  ? Ultram [Tramadol] Hives  ? Vancomycin Itching  ? ? ?Antimicrobials this admission: ?4/4 acyclovir >>  ? ? ?Thank you for allowing pharmacy to be a part of this patient?s care. ? ?6/4 ?04/12/2021 1:01 PM ? ?

## 2021-04-12 NOTE — ED Notes (Addendum)
This RN was giving report to carelink when family member stepped out of room to grab this RN. Pt was unable to be redirected by sitter and was out of bed. IV in LFA was pulled out on cleviprex running on the floor. Pt continues to yell and cuss at staff. MD at bedside to deescalate this situation.  ?

## 2021-04-13 ENCOUNTER — Inpatient Hospital Stay (HOSPITAL_COMMUNITY): Payer: Medicaid Other

## 2021-04-13 DIAGNOSIS — I611 Nontraumatic intracerebral hemorrhage in hemisphere, cortical: Secondary | ICD-10-CM

## 2021-04-13 DIAGNOSIS — R569 Unspecified convulsions: Secondary | ICD-10-CM

## 2021-04-13 LAB — GLUCOSE, CAPILLARY
Glucose-Capillary: 257 mg/dL — ABNORMAL HIGH (ref 70–99)
Glucose-Capillary: 271 mg/dL — ABNORMAL HIGH (ref 70–99)
Glucose-Capillary: 300 mg/dL — ABNORMAL HIGH (ref 70–99)
Glucose-Capillary: 391 mg/dL — ABNORMAL HIGH (ref 70–99)
Glucose-Capillary: 281 mg/dL — ABNORMAL HIGH (ref 70–99)

## 2021-04-13 LAB — HEMOGLOBIN A1C
Hgb A1c MFr Bld: 13.2 % — ABNORMAL HIGH (ref 4.8–5.6)
Mean Plasma Glucose: 332 mg/dL

## 2021-04-13 MED ORDER — RAMIPRIL 5 MG PO CAPS
5.0000 mg | ORAL_CAPSULE | Freq: Every day | ORAL | Status: DC
  Administered 2021-04-13 – 2021-04-14 (×2): 5 mg via ORAL
  Filled 2021-04-13 (×2): qty 1

## 2021-04-13 MED ORDER — INSULIN DETEMIR 100 UNIT/ML ~~LOC~~ SOLN
10.0000 [IU] | Freq: Two times a day (BID) | SUBCUTANEOUS | Status: DC
  Administered 2021-04-13 – 2021-04-14 (×3): 10 [IU] via SUBCUTANEOUS
  Filled 2021-04-13 (×4): qty 0.1

## 2021-04-13 MED ORDER — LABETALOL HCL 5 MG/ML IV SOLN
20.0000 mg | INTRAVENOUS | Status: DC | PRN
  Administered 2021-04-13 – 2021-04-14 (×5): 20 mg via INTRAVENOUS
  Filled 2021-04-13 (×5): qty 4

## 2021-04-13 MED ORDER — HYDRALAZINE HCL 20 MG/ML IJ SOLN
20.0000 mg | Freq: Four times a day (QID) | INTRAMUSCULAR | Status: DC | PRN
  Administered 2021-04-14 (×2): 20 mg via INTRAVENOUS
  Filled 2021-04-13 (×3): qty 1

## 2021-04-13 MED ORDER — CHLORHEXIDINE GLUCONATE CLOTH 2 % EX PADS
6.0000 | MEDICATED_PAD | Freq: Every day | CUTANEOUS | Status: DC
  Administered 2021-04-13 – 2021-04-14 (×2): 6 via TOPICAL

## 2021-04-13 NOTE — Progress Notes (Signed)
PT Cancellation Note ? ?Patient Details ?Name: Caitlyn Fuller ?MRN: [id number] ?DOB: December 02, 1975 ? ? ?Cancelled Treatment:    Reason Eval/Treat Not Completed: Active bedrest order Will await increase in activity orders prior to PT evaluation. Will follow. ? ? ?Blake Divine A Wafa Martes ?04/13/2021, 7:06 AM ?[place], PT, DPT ?Acute Rehabilitation Services ?Secure chat preferred ?Office [phone] ? ? ? ?

## 2021-04-13 NOTE — Progress Notes (Signed)
SLP Cancellation Note ? ?Patient Details ?Name: Caitlyn Fuller ?MRN: [id number] ?DOB: 1975-07-23 ? ? ?Cancelled treatment:       Reason Eval/Treat Not Completed: Patient's level of consciousness. Patient asleep and only became briefly alert to state her name and birthday when requested. Unable to maintain adequate arousal for speech-language cognitive evaluation. SLP will continue efforts. ? ?Angela Nevin, MA, CCC-SLP ?Speech Therapy ? ?

## 2021-04-13 NOTE — Progress Notes (Signed)
OT Cancellation Note ? ?Patient Details ?Name: Caitlyn Fuller ?MRN: [id number] ?DOB: 18-Sep-1975 ? ? ?Cancelled Treatment:    Reason Eval/Treat Not Completed: Patient not medically ready;Active bedrest order OT order received and appreciated however this conflicts with current bedrest order set. Please increase activity tolerance as appropriate and remove bedrest from orders. . Please contact OT at [phone] if bed rest order is discontinued. OT will hold evaluation at this time and will check back as time allows pending increased activity orders. ? ? ?Jeri Modena ?04/13/2021, 8:15 AM ?

## 2021-04-13 NOTE — Procedures (Signed)
Patient Name: Tanaja Ganger Lipson  ?MRN: [id number]  ?Epilepsy Attending: Charlsie Quest  ?Referring Physician/Provider: Caryl Pina, MD ?Date: 04/13/2021 ?Duration: 22.55 mins ? ?Patient history: 46 year old female with acute left temporal lobe hemorrhage. She presented after a witnessed generalized seizure with subsequent postictal period.  EEG to evaluate for seizure. ? ?Level of alertness: Awake, asleep ? ?AEDs during EEG study: LEV ? ?Technical aspects: This EEG study was done with scalp electrodes positioned according to the 10-20 International system of electrode placement. Electrical activity was acquired at a sampling rate of 500Hz  and reviewed with a high frequency filter of 70Hz  and a low frequency filter of 1Hz . EEG data were recorded continuously and digitally stored.  ? ?Description: The posterior dominant rhythm consists of 9-10 Hz activity of moderate voltage (25-35 uV) seen predominantly in posterior head regions, symmetric and reactive to eye opening and eye closing. Sleep was characterized by vertex waves, sleep spindles (12 to 14 Hz), maximal frontocentral region. Hyperventilation and photic stimulation were not performed.    ? ?IMPRESSION: ?This study is within normal limits. No seizures or epileptiform discharges were seen throughout the recording. ? ?  ? ?

## 2021-04-13 NOTE — Evaluation (Addendum)
Physical Therapy Evaluation ?Patient Details ?Name: Caitlyn Fuller ?MRN: [id number] ?DOB: [birth year] ?Today's Date: 04/13/2021 ? ?History of Present Illness ? Patient is a 46 y/o female who presents on 4/4 after witnessed seizure activity by family with subsequent postictal period. + Cocaine + THC. [place]- left temporal ICH with small IVH. PMH includes DM, HTN, shingles.  ?Clinical Impression ? Patient presents with impaired cognition s/p above. 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 Pt not able to recall age or where she worked during [place]. Also not sure why she was in the hospital. Tolerated walking short distance to the bathroom with supervision and assist with lines. Slightly impulsive. Further ambulation deferred due to elevated BP. RN aware. Will follow acutely to perform higher level balance challenges and address cognitive deficits in a functional context prior to return home. ?   ? ?Recommendations for follow up therapy are one component of a multi-disciplinary discharge planning process, led by the attending physician.  Recommendations may be updated based on patient status, additional functional criteria and insurance authorization. ? ?Follow Up Recommendations Outpatient PT (pending progress) ? ?  ?Assistance Recommended at Discharge Intermittent Supervision/Assistance  ?Patient can return home with the following ? Help with stairs or ramp for entrance;Direct supervision/assist for financial management;Direct supervision/assist for medications management ? ?  ?Equipment Recommendations None recommended by PT  ?Recommendations for Other Services ?    ?  ?Functional Status Assessment Patient has had a recent decline in their functional status and demonstrates the ability to make significant improvements in function in a reasonable and predictable amount of time.  ? ?  ?Precautions / Restrictions Precautions ?Precautions: Other (comment) ?Precaution Comments:  keep SBP <160 ?Restrictions ?Weight Bearing Restrictions: No  ? ?  ? ?Mobility ? Bed Mobility ?Overal bed mobility: Modified Independent ?  ?  ?  ?  ?  ?  ?General bed mobility comments: Assist needed to manage lines. ?  ? ?Transfers ?Overall transfer level: Needs assistance ?Equipment used: None ?Transfers: Sit to/from Stand ?Sit to Stand: Supervision ?  ?  ?  ?  ?  ?General transfer comment: Supervision for safety and to manage lines as pt slightly impulsive. Stood from [employer], from toilet x1, transferred to chair post bathroom. ?  ? ?Ambulation/Gait ?Ambulation/Gait assistance: Min guard ?Gait Distance (Feet): 15 Feet (x2 bouts) ?Assistive device: None ?Gait Pattern/deviations: Step-through pattern, Decreased stride length ?Gait velocity: decreased ?  ?  ?General Gait Details: Steady, impulsive gait with no evidence of imbalance. Deferred further distance due to elevated BP (high diastolic [id number]). ? ?Stairs ?  ?  ?  ?  ?  ? ?Wheelchair Mobility ?  ? ?Modified Rankin (Stroke Patients Only) ?  ? ?  ? ?Balance Overall balance assessment: No apparent balance deficits (not formally assessed) ?  ?  ?  ?  ?  ?  ?  ?  ?  ?  ?  ?  ?  ?  ?  ?  ?  ?  ?   ? ? ? ?Pertinent Vitals/Pain Pain Assessment ?Pain Assessment: Faces ?Faces Pain Scale: Hurts even more ?Pain Location: "everywhere" back and LEs ?Pain Descriptors / Indicators: Constant ?Pain Intervention(s): Monitored during session, Repositioned, Premedicated before session  ? ? ?Home Living Family/patient expects to be discharged to:: Private residence ?Living Arrangements: Children;Spouse/significant other ?Available Help at Discharge: Family;Available PRN/intermittently ?Type of Home: Apartment ?Home Access: Stairs to enter ?Entrance Stairs-Rails: Right ?Entrance Stairs-Number of  Steps: 2 flights ?  ?Home Layout: One level ?Home Equipment: None ?   ?  ?Prior Function Prior Level of Function : Independent/Modified Independent ?  ?  ?  ?  ?  ?  ?Mobility Comments:  Independent, drives, works as a Electrical engineer ?ADLs Comments: independent ?  ? ? ?Hand Dominance  ? Dominant Hand: Right ? ?  ?Extremity/Trunk Assessment  ? Upper Extremity Assessment ?Upper Extremity Assessment: Defer to OT evaluation ?  ? ?Lower Extremity Assessment ?Lower Extremity Assessment: Overall WFL for tasks assessed ?  ? ?Cervical / Trunk Assessment ?Cervical / Trunk Assessment: Other exceptions ?Cervical / Trunk Exceptions: hx of chronic back pain  ?Communication  ? Communication: No difficulties  ?Cognition Arousal/Alertness: Awake/alert ?Behavior During Therapy: [facility] for tasks assessed/performed, Impulsive ?Overall Cognitive Status: Impaired/Different from baseline ?Area of Impairment: Orientation, Memory, Safety/judgement ?  ?  ?  ?  ?  ?  ?  ?  ?Orientation Level: Disoriented to, Situation ?  ?Memory: Decreased short-term memory ?  ?Safety/Judgement: Decreased awareness of safety ?  ?  ?General Comments: Not able to recall events leading to hospitalization or knowing about stroke despite being told multiple times. Not able to recall where she works or her age. "I am 46 right, wait, how old am I?" Pleasant. Slightly impulsive with poor awareness of lines. ?  ?  ? ?  ?General Comments General comments (skin integrity, edema, etc.): BP elevated 159/117 post walk to bathroom so deferred further gait. Pt asymptomatic. ? ?  ?Exercises    ? ?Assessment/Plan  ?  ?PT Assessment Patient needs continued PT services  ?PT Problem List Decreased cognition;Cardiopulmonary status limiting activity;Decreased activity tolerance;Pain;Decreased safety awareness ? ?   ?  ?PT Treatment Interventions Therapeutic exercise;Gait training;Stair training;Patient/family education;Therapeutic activities;Functional mobility training;Cognitive remediation;Neuromuscular re-education;Balance training   ? ?PT Goals (Current goals can be found in the Care Plan section)  ?Acute Rehab PT Goals ?Patient Stated Goal: go home ?PT Goal  Formulation: With patient ?Time For Goal Achievement: 04/27/21 ?Potential to Achieve Goals: Good ?Additional Goals ?Additional Goal #1: Pt will score >19/24 on DGI to indicate decreased risk for falls. ? ?  ?Frequency Min 4X/week ?  ? ? ?Co-evaluation   ?  ?  ?  ?  ? ? ?  ?AM-PAC PT "6 Clicks" Mobility  ?Outcome Measure Help needed turning from your back to your side while in a flat bed without using bedrails?: None ?Help needed moving from lying on your back to sitting on the side of a flat bed without using bedrails?: None ?Help needed moving to and from a bed to a chair (including a wheelchair)?: None ?Help needed standing up from a chair using your arms (e.g., wheelchair or bedside chair)?: A Little ?Help needed to walk in hospital room?: A Little ?Help needed climbing 3-5 steps with a railing? : A Little ?6 Click Score: 21 ? ?  ?End of Session   ?Activity Tolerance: Patient tolerated treatment well ?Patient left: in chair;with call bell/phone within reach;with family/visitor present;with nursing/sitter in room ?Nurse Communication: Mobility status ?PT Visit Diagnosis: Difficulty in walking, not elsewhere classified (R26.2);Pain ?Pain - part of body:  ("back) ?  ? ?Time: [id number] ?PT Time Calculation (min) (ACUTE ONLY): 21 min ? ? ?Charges:   PT Evaluation ?$PT Eval Moderate Complexity: 1 Mod ?  ?  ?   ? ? ?Marisa Severin, PT, DPT ?Acute Rehabilitation Services ?Secure chat preferred ?Office [phone] ? ? ? ? ?[place] ?04/13/2021, 2:32  PM ? ?

## 2021-04-13 NOTE — Progress Notes (Signed)
Dr Otelia Limes made aware of patient wanting to leave AMA due to uncontrolled pain. MD ordered a one time dose of 12.47mcg of Fentanyl IV push. This RN wasted 37.52mcg of Fentanyl with Sharlot Gowda. ?

## 2021-04-13 NOTE — Progress Notes (Incomplete)
STROKE TEAM PROGRESS NOTE  ? ?HISTORY OF PRESENT ILLNESS (per record) ?Caitlyn Fuller is an 46 y.o. female with a history of diabetes, asthma,hypertension and chronic pain on oxycodone. The patient was most recently seen in the ED for scalp lesions in August of last year. She was then admitted to [facility] for abscess/cellulitis of the scalp requiring IV antibiotics. ?Today she presented to [facility] as a transfer from [facility] with an apparent witnessed seizure by family/friends that lasted about 10 mins per their report. The family describes episode as jerking, shaking movements with eyes rolling back into head and then she lost of control of her bladder. Patient denies hx of seizures and no report of seizure activity during EMS transport. EMS reported patient altered and postictal in ambulance. She was extremely hypertensive on arrival to the [facility] ED and was started on clevidipine gtt. She was loaded with Keppra in the [facility] ED. She was subsequently transferred to [facility] for further management.  ?Of note, while still in the [facility] ED, the patient became more agitated, anxious appearing, and confrontational with staff. She was primarily concerned about withdrawal from chronic opiate pain medication. She is on 15 mg oxycodone every 8 hours at home. ?Upon arrival to [facility] ICU room, nurse reported patient was lethargic but arousal to voice, but not cooperative with exam. The patient is now more alert.  She reports a headache and pain all over. She denies other acute complaints.   ? ? ?INTERVAL HISTORY ?Her *** is at the bedside.  *** ? ? ? ?OBJECTIVE ?Vitals:  ? 04/13/21 0315 04/13/21 0330 04/13/21 0345 04/13/21 0400  ?BP: 136/68 138/70 (!) 143/72 (!) 149/71  ?Pulse: 77 77 79 83  ?Resp:      ?Temp:      ?TempSrc:      ?SpO2: 94% 94% 94% 96%  ? ? ?CBC:  ?Recent Labs  ?Lab 04/12/21 ?0451 04/12/21 ?1338  ?WBC 14.0* 15.1*  ?NEUTROABS 10.0*  --   ?HGB 15.7* 15.9*  ?HCT 50.3* 49.0*  ?MCV  83.3 82.6  ?PLT 262 249  ? ? ?Basic Metabolic Panel:  ?Recent Labs  ?Lab 04/12/21 ?0451 04/12/21 ?[id number] 04/12/21 ?1338  ?NA 135  --  137  ?K 3.5  --  3.5  ?CL 98  --  100  ?CO2 26  --  27  ?GLUCOSE 366*  --  340*  ?BUN 12  --  10  ?CREATININE 0.63  --  0.72  ?CALCIUM 9.0  --  8.9  ?MG  --  1.9  --   ? ? ?Lipid Panel:  ?   ?Component Value Date/Time  ? CHOL 179 04/12/2021 1338  ? TRIG 116 04/12/2021 1338  ? HDL 58 04/12/2021 1338  ? CHOLHDL 3.1 04/12/2021 1338  ? VLDL 23 04/12/2021 1338  ? [place] 98 04/12/2021 1338  ? ?HgbA1c:  ?Lab Results  ?Component Value Date  ? HGBA1C 13.5 (H) 11/02/2014  ? ?Urine Drug Screen:  ?   ?Component Value Date/Time  ? [place] DETECTED 04/12/2021 0506  ? [place] DETECTED 03/18/2012 0440  ? COCAINSCRNUR POSITIVE (A) 04/12/2021 0506  ? [place] DETECTED 04/12/2021 0506  ? LABBENZ POSITIVE (A) 03/18/2012 0440  ? AMPHETMU NONE DETECTED 04/12/2021 0506  ? AMPHETMU NONE DETECTED 03/18/2012 0440  ? THCU POSITIVE (A) 04/12/2021 0506  ? [place] DETECTED 03/18/2012 0440  ? [place] DETECTED 04/12/2021 0506  ? [place] DETECTED 03/18/2012 0440  ?  ?Alcohol  Level  ?   ?Component Value Date/Time  ? ETH <10 04/12/2021 0451  ? ? ?IMAGING ? ? ?CT ANGIO HEAD NECK W WO CM ? ?Result Date: 04/12/2021 ?CLINICAL DATA:  Stroke follow-up.  Seizure-like activity EXAM: CT ANGIOGRAPHY HEAD AND NECK TECHNIQUE: Multidetector CT imaging of the head and neck was performed using the standard protocol during bolus administration of intravenous contrast. Multiplanar CT image reconstructions and MIPs were obtained to evaluate the vascular anatomy. Carotid stenosis measurements (when applicable) are obtained utilizing NASCET criteria, using the distal internal carotid diameter as the denominator. RADIATION DOSE REDUCTION: This exam was performed according to the departmental dose-optimization program which includes automated exposure control, adjustment of the mA and/or kV according to patient size  and/or use of iterative reconstruction technique. CONTRAST:  21mL OMNIPAQUE IOHEXOL 350 MG/ML SOLN COMPARISON:  Head CT from earlier today FINDINGS: CTA NECK FINDINGS Aortic arch: Normal with 3 vessel branching Right carotid system: Vessels are smoothly contoured and diffusely patent. ICA tortuosity Left carotid system: Vessels are smoothly contoured and widely patent. ICA tortuosity. Vertebral arteries: No proximal subclavian stenosis. Right dominant vertebral artery. Skeleton: No acute or aggressive finding Other neck: Congested appearance of nasal mucosa.  No acute finding Upper chest: Clear apical lungs Review of the MIP images confirms the above findings CTA HEAD FINDINGS Anterior circulation: No aneurysm or vasculopathy seen underlying the intracranial hemorrhage. No evidence of vascular malformation or flow limiting stenosis. Posterior circulation: No aneurysm or vascular malformation to explain the intracranial hemorrhage. Negative for spot sign. Negative for stenosis or branch occlusion. Venous sinuses: Negative Anatomic variants: Hypoplastic left A1 segment and fetal type left PCA. Review of the MIP images confirms the above findings IMPRESSION: Negative CTA.  No explanation for the intracranial hemorrhage. Electronically Signed   By: Jorje Guild M.D.   On: 04/12/2021 06:59  ? ?CT Head Wo Contrast ? ?Result Date: 04/12/2021 ?CLINICAL DATA:  Altered mental status, known hemorrhage EXAM: CT HEAD WITHOUT CONTRAST TECHNIQUE: Contiguous axial images were obtained from the base of the skull through the vertex without intravenous contrast. RADIATION DOSE REDUCTION: This exam was performed according to the departmental dose-optimization program which includes automated exposure control, adjustment of the mA and/or kV according to patient size and/or use of iterative reconstruction technique. COMPARISON:  CT head obtained earlier the same morning FINDINGS: Brain: The 1.2 cm x 1.0 cm by 0.9 cm intraparenchymal  hemorrhage centered in the left anterior temporal lobe is not significantly changed. There is a small amount of blood in the adjacent temporal horn of the left lateral ventricle, also unchanged. There is no significant surrounding edema. There is no new intracranial hemorrhage. There is no evidence of acute infarct. Background parenchymal volume is normal. The ventricles are stable in size. There is no solid mass lesion.  There is no midline shift. Vascular: No hyperdense vessel or unexpected calcification. Skull: Normal. Negative for fracture or focal lesion. Sinuses/Orbits: There is mild mucosal thickening in the paranasal sinuses. The globes and orbits are unremarkable. Other: None. IMPRESSION: Unchanged 1.2 cm intraparenchymal hematoma centered in the left temporal lobe with small volume intraventricular extension. Electronically Signed   By: Valetta Mole M.D.   On: 04/12/2021 08:27  ? ?CT Head Wo Contrast ? ?Result Date: 04/12/2021 ?CLINICAL DATA:  Seizure, new onset. EXAM: CT HEAD WITHOUT CONTRAST TECHNIQUE: Contiguous axial images were obtained from the base of the skull through the vertex without intravenous contrast. RADIATION DOSE REDUCTION: This exam was performed according to the departmental  dose-optimization program which includes automated exposure control, adjustment of the mA and/or kV according to patient size and/or use of iterative reconstruction technique. COMPARISON:  08/21/2020 FINDINGS: Brain: Hemorrhage in the temporal horn of the left lateral ventricle which appears to arise from the adjacent white matter where there is a rounded parenchymal centered hematoma measuring 1 cm in diameter. Presumed chronic small vessel ischemia in the hemispheric white matter, mainly periventricular, age premature but associated with small-vessel risk factors in the chart. No evidence of cortical infarct, hydrocephalus, or extra-axial collection. Vascular: No hyperdense vessel or unexpected calcification. Skull:  Normal. Negative for fracture or focal lesion. Sinuses/Orbits: Mild generalized mucosal thickening in paranasal sinuses. Other: Critical Value/emergent results were called by telephone at the time of interp

## 2021-04-13 NOTE — Plan of Care (Signed)
Progressing Transfer orders in along with PT and OT family involved with care ?

## 2021-04-13 NOTE — Progress Notes (Addendum)
STROKE TEAM PROGRESS NOTE  ? ?INTERVAL HISTORY ?Caitlyn Fuller is an 46 y.o. female with a history of diabetes, asthma, hypertension and chronic pain on oxycodone. She experienced a witnessed episode of generalized tonic clonic activity that family says lasted 10 minutes. She was initially lethargic and confused but her mental status has since improved. Routine EEG was negative for electrographic seizure or epileptiform discharge. MRI-Brain notable for multifocal infarcts in the right hemisphere involving insula and frontal operculum and area of hemorrhage in the L medial temporal lobe with trace intraventricular extension.Marland Kitchen UDS was positive for marijuana and cocaine. ? ?Patient is seen in her room this morning with no family at bedside. She is alert and able to converse. She is unable to answer some orientation questions such as who the president is. She denies using drugs.  She appears to have no focal deficits ? ?Vitals:  ? 04/13/21 0900 04/13/21 0915 04/13/21 0930 04/13/21 0945  ?BP: (!) 183/107 (!) 180/84 (!) 186/97 (!) 179/100  ?Pulse: 98 91 92 (!) 106  ?Resp: 14 10 19  (!) 21  ?Temp:      ?TempSrc:      ?SpO2: 94% 96% 94% 92%  ? ?CBC:  ?Recent Labs  ?Lab 04/12/21 ?0451 04/12/21 ?1338  ?WBC 14.0* 15.1*  ?NEUTROABS 10.0*  --   ?HGB 15.7* 15.9*  ?HCT 50.3* 49.0*  ?MCV 83.3 82.6  ?PLT 262 249  ? ?Basic Metabolic Panel:  ?Recent Labs  ?Lab 04/12/21 ?0451 04/12/21 ?[id number] 04/12/21 ?1338  ?NA 135  --  137  ?K 3.5  --  3.5  ?CL 98  --  100  ?CO2 26  --  27  ?GLUCOSE 366*  --  340*  ?BUN 12  --  10  ?CREATININE 0.63  --  0.72  ?CALCIUM 9.0  --  8.9  ?MG  --  1.9  --   ? ?Lipid Panel:  ?Recent Labs  ?Lab 04/12/21 ?1338  ?CHOL 179  ?TRIG 116  ?HDL 58  ?CHOLHDL 3.1  ?VLDL 23  ?[place] 98  ? ?HgbA1c: No results for input(s): HGBA1C in the last 168 hours. ?Urine Drug Screen:  ?Recent Labs  ?Lab 04/12/21 ?[id number]  ?LABOPIA NONE DETECTED  ?COCAINSCRNUR POSITIVE*  ?LABBENZ NONE DETECTED  ?AMPHETMU NONE DETECTED  ?THCU  POSITIVE*  ?LABBARB NONE DETECTED  ?  ?Alcohol Level  ?Recent Labs  ?Lab 04/12/21 ?0451  ?ETH <10  ? ? ?IMAGING past 24 hours ?MR BRAIN WO CONTRAST ? ?Result Date: 04/12/2021 ?CLINICAL DATA:  Hemorrhagic stroke EXAM: MRI HEAD WITHOUT CONTRAST TECHNIQUE: Multiplanar, multiecho pulse sequences of the brain and surrounding structures were obtained without intravenous contrast. COMPARISON:  Head CT 04/12/2021 FINDINGS: Brain: There is multifocal abnormal diffusion restriction in the right hemisphere involving the insula and frontal operculum. There is intraparenchymal hemorrhage in the left temporal lobe with a small amount of intraventricular extension. Further assessment is limited by the incomplete nature of the examination due to patient mental status and inability to cooperate with the full length of the study. Vascular: Major flow voids are preserved. Skull and upper cervical spine: Normal calvarium and skull base. Visualized upper cervical spine and soft tissues are normal. Sinuses/Orbits:No paranasal sinus fluid levels or advanced mucosal thickening. No mastoid or middle ear effusion. Normal orbits. IMPRESSION: 1. Truncated and motion degraded examination. 2. Multifocal acute ischemia in the right hemisphere involving the insula and frontal operculum. 3. Intraventricular hemorrhage in the medial left temporal lobe with a small amount of intraventricular extension. Electronically Signed  By: Ulyses Jarred M.D.   On: 04/12/2021 22:02  ? ?EEG adult ? ?Result Date: 04/13/2021 ?Lora Havens, MD     04/13/2021 10:49 AM Patient Name: Caitlyn Fuller MRN: [id number] Epilepsy Attending: Lora Havens Referring Physician/Provider: Kerney Elbe, MD Date: 04/13/2021 Duration: 22.55 mins Patient history: 46 year old female with acute left temporal lobe hemorrhage. She presented after a witnessed generalized seizure with subsequent postictal period.  EEG to evaluate for seizure. Level of alertness: Awake, asleep AEDs  during EEG study: LEV Technical aspects: This EEG study was done with scalp electrodes positioned according to the 10-20 International system of electrode placement. Electrical activity was acquired at a sampling rate of 500Hz  and reviewed with a high frequency filter of 70Hz  and a low frequency filter of 1Hz . EEG data were recorded continuously and digitally stored. Description: The posterior dominant rhythm consists of 9-10 Hz activity of moderate voltage (25-35 uV) seen predominantly in posterior head regions, symmetric and reactive to eye opening and eye closing. Sleep was characterized by vertex waves, sleep spindles (12 to 14 Hz), maximal frontocentral region. Hyperventilation and photic stimulation were not performed.   IMPRESSION: This study is within normal limits. No seizures or epileptiform discharges were seen throughout the recording. Caitlyn Fuller  ? ?ECHOCARDIOGRAM LIMITED ? ?Result Date: 04/12/2021 ?   ECHOCARDIOGRAM REPORT   Patient Name:   Caitlyn Fuller Date of Exam: 04/12/2021 Medical Rec #:  [id number]           Height:       58.0 in Accession #:    [id number]          Weight:       217.0 lb Date of Birth:  March 01, 1975           BSA:          1.885 m? Patient Age:    56 years            BP:           134/70 mmHg Patient Gender: F                   HR:           92 bpm. Exam Location:  Inpatient Procedure: 2D Echo, Cardiac Doppler, Color Doppler and Limited Echo Indications:    Stroke  History:        Patient has no prior history of Echocardiogram examinations.                 Risk Factors:Hypertension.  Sonographer:    Jyl Heinz Referring Phys: [phone] ERIC LINDZEN  Sonographer Comments: Image acquisition challenging due to uncooperative patient and Image acquisition challenging due to patient behavioral factors. Study ended due to patient refusing further imaging. IMPRESSIONS  1. Left ventricular ejection fraction, by estimation, is 65 to 70%. The left ventricle has normal function. The left  ventricle has no regional wall motion abnormalities. There is moderate concentric left ventricular hypertrophy. Left ventricular diastolic parameters are consistent with Grade I diastolic dysfunction (impaired relaxation).  2. Right ventricular systolic function was not well visualized. The right ventricular size is not well visualized. Tricuspid regurgitation signal is inadequate for assessing PA pressure.  3. Left atrial size was mildly dilated.  4. The mitral valve is normal in structure. No evidence of mitral valve regurgitation.  5. The aortic valve is tricuspid. Aortic valve regurgitation is not visualized. No aortic stenosis is present. Conclusion(s)/Recommendation(s): Dedicated views of the  right ventricle, some apical views of the left ventricle and subcostal views (including visualization of the atrial septum) were not completed due to patient refusal. Consider completing the study at a future time, if necessary for full diagnostic workup. If a TEE is indicated, the information can be obtained during that procedure. FINDINGS  Left Ventricle: Left ventricular ejection fraction, by estimation, is 65 to 70%. The left ventricle has normal function. The left ventricle has no regional wall motion abnormalities. The left ventricular internal cavity size was normal in size. There is  moderate concentric left ventricular hypertrophy. Left ventricular diastolic parameters are consistent with Grade I diastolic dysfunction (impaired relaxation). Right Ventricle: The right ventricular size is not well visualized. No increase in right ventricular wall thickness. Right ventricular systolic function was not well visualized. Tricuspid regurgitation signal is inadequate for assessing PA pressure. Left Atrium: Left atrial size was mildly dilated. Right Atrium: Right atrial size was normal in size. Pericardium: There is no evidence of pericardial effusion. Mitral Valve: The mitral valve is normal in structure. No evidence of  mitral valve regurgitation. Tricuspid Valve: The tricuspid valve is normal in structure. Tricuspid valve regurgitation is not demonstrated. Aortic Valve: The aortic valve is tricuspid. Aortic valve regurgitation

## 2021-04-13 NOTE — Progress Notes (Signed)
EEG completed, results pending. 

## 2021-04-13 NOTE — Plan of Care (Signed)
?  Problem: Education: ?Goal: Knowledge of General Education information will improve ?Description: Including pain rating scale, medication(s)/side effects and non-pharmacologic comfort measures ?Outcome: Progressing ?  ?Problem: Clinical Measurements: ?Goal: Will remain free from infection ?Outcome: Progressing ?Goal: Diagnostic test results will improve ?Outcome: Progressing ?  ?Problem: Activity: ?Goal: Risk for activity intolerance will decrease ?Outcome: Progressing ?  ?Problem: Elimination: ?Goal: Will not experience complications related to urinary retention ?Outcome: Progressing ?  ?Problem: Pain Managment: ?Goal: General experience of comfort will improve ?Outcome: Progressing ?  ?Problem: Safety: ?Goal: Ability to remain free from injury will improve ?Outcome: Progressing ?  ?Problem: Education: ?Goal: Knowledge of disease or condition will improve ?04/13/2021 0558 by Alexander Mt, RN ?Outcome: Progressing ?04/13/2021 0557 by Alexander Mt, RN ?Outcome: Progressing ?Goal: Knowledge of secondary prevention will improve (SELECT ALL) ?Outcome: Progressing ?Goal: Knowledge of patient specific risk factors will improve (INDIVIDUALIZE FOR PATIENT) ?Outcome: Progressing ?  ?Problem: Coping: ?Goal: Level of anxiety will decrease ?Outcome: Not Progressing ?  ?

## 2021-04-13 NOTE — Evaluation (Signed)
Occupational Therapy Evaluation ?Patient Details ?Name: Caitlyn Fuller ?MRN: [id number] ?DOB: [birth year] ?Today's Date: 04/13/2021 ? ? ?History of Present Illness Patient is a 46 y/o female who presents on 4/4 after witnessed seizure activity by family with subsequent postictal period. + Cocaine + THC. [place]- left temporal ICH with small IVH. PMH includes DM, HTN, shingles.  ? ?Clinical Impression ?  ?PT admitted with CVA due to HTN with + substance abuse noted. Pt currently with functional limitiations due to the deficits listed below (see OT problem list). Pt currently with no recall of new information during session and lack of awareness to balance deficits. Pt with elevated HTN and glucose this session Pt pleasant and agreeable to all task asked.  Pt will benefit from skilled OT to increase their independence and safety with adls and balance to allow discharge outpatient. ? ?   ? ?Recommendations for follow up therapy are one component of a multi-disciplinary discharge planning process, led by the attending physician.  Recommendations may be updated based on patient status, additional functional criteria and insurance authorization.  ? ?Follow Up Recommendations ? Outpatient OT  ?  ?Assistance Recommended at Discharge Set up Supervision/Assistance  ?Patient can return home with the following A little help with walking and/or transfers;A little help with bathing/dressing/bathroom;Assistance with cooking/housework;Assistance with feeding;Direct supervision/assist for medications management;Direct supervision/assist for financial management;Assist for transportation ? ?  ?Functional Status Assessment ? Patient has had a recent decline in their functional status and demonstrates the ability to make significant improvements in function in a reasonable and predictable amount of time.  ?Equipment Recommendations ? None recommended by OT  ?  ?Recommendations for Other Services   ? ? ?  ?Precautions / Restrictions  Precautions ?Precautions: Other (comment) ?Precaution Comments: keep SBP <160 ?Restrictions ?Weight Bearing Restrictions: No  ? ?  ? ?Mobility Bed Mobility ?Overal bed mobility: Modified Independent ?  ?  ?  ?  ?  ?  ?General bed mobility comments: Assist needed to manage lines. ?  ? ?Transfers ?Overall transfer level: Needs assistance ?Equipment used: None ?Transfers: Sit to/from Stand ?Sit to Stand: Supervision ?  ?  ?  ?  ?  ?General transfer comment: Supervision for safety and to manage lines as pt slightly impulsive. Stood from [employer], from toilet x1, transferred to chair post bathroom. ?  ? ?  ?Balance Overall balance assessment: No apparent balance deficits (not formally assessed) ?  ?  ?  ?  ?  ?  ?  ?  ?  ?  ?  ?  ?  ?  ?  ?  ?  ?  ?   ? ?ADL either performed or assessed with clinical judgement  ? ?ADL Overall ADL's : Needs assistance/impaired ?Eating/Feeding: Modified independent ?  ?Grooming: Wash/dry hands;Min guard ?  ?Upper Body Bathing: Min guard ?  ?  ?  ?  ?  ?Lower Body Dressing: Min guard ?Lower Body Dressing Details (indicate cue type and reason): able to figure 4 cross to don socks ?Toilet Transfer: Min guard ?Toilet Transfer Details (indicate cue type and reason): cues for safety with IV pole ?Toileting- Water quality scientist and Hygiene: Min guard ?  ?  ?  ?Functional mobility during ADLs: Min guard ?General ADL Comments: pt progressed to bathroom then chair. BP not controlled so resumed sitting in chair for lunch.  ? ? ? ?Vision Baseline Vision/History: 0 No visual deficits ?Additional Comments: none noted this session but could benefit from further paper assessment  ?   ?  Perception   ?  ?Praxis   ?  ? ?Pertinent Vitals/Pain Pain Assessment ?Pain Assessment: Faces ?Faces Pain Scale: Hurts even more ?Pain Location: "everywhere" back and LEs ?Pain Descriptors / Indicators: Constant ?Pain Intervention(s): Monitored during session, Premedicated before session, Repositioned  ? ? ? ?Hand Dominance  Right ?  ?Extremity/Trunk Assessment Upper Extremity Assessment ?Upper Extremity Assessment: Overall WFL for tasks assessed ?  ?Lower Extremity Assessment ?Lower Extremity Assessment: Defer to PT evaluation ?  ?Cervical / Trunk Assessment ?Cervical / Trunk Assessment: Other exceptions ?Cervical / Trunk Exceptions: hx of chronic back pain ?  ?Communication Communication ?Communication: No difficulties ?  ?Cognition Arousal/Alertness: Awake/alert ?Behavior During Therapy: [facility] for tasks assessed/performed, Impulsive ?Overall Cognitive Status: Impaired/Different from baseline ?Area of Impairment: Orientation, Memory, Safety/judgement ?  ?  ?  ?  ?  ?  ?  ?  ?Orientation Level: Disoriented to, Situation ?  ?Memory: Decreased short-term memory ?  ?Safety/Judgement: Decreased awareness of safety ?  ?  ?General Comments: Not able to recall events leading to hospitalization or knowing about stroke despite being told multiple times. Not able to recall where she works or her age.Pt states i work in [employer]. wait give a minute. Thats wild i cant remember rhow old am I?" Pleasant. Slightly impulsive with poor awareness of lines. pt able to recall daughter and daughter girlfriend present in room. pt insist on knowing therapist from somewhere. ?  ?  ?General Comments  BP elevagted159/117 pt without any symptoms or awareness. pt with poor recall of education from RN previously about CVA. OT educating at start of session and end of session pt unable to recall informatoin to daughter ? ?  ?Exercises   ?  ?Shoulder Instructions    ? ? ?Home Living Family/patient expects to be discharged to:: Private residence ?Living Arrangements: Children;Spouse/significant other ?Available Help at Discharge: Family;Available PRN/intermittently ?Type of Home: Apartment ?Home Access: Stairs to enter ?Entrance Stairs-Number of Steps: 2 flights ?Entrance Stairs-Rails: Right ?Home Layout: One level ?  ?  ?Bathroom Shower/Tub: Tub/shower unit ?  ?Bathroom  Toilet: Standard ?  ?  ?Home Equipment: None ?  ?  ?  ? ?  ?Prior Functioning/Environment Prior Level of Function : Independent/Modified Independent ?  ?  ?  ?  ?  ?  ?Mobility Comments: Independent, drives, works as a Electrical engineer ?ADLs Comments: independent ?  ? ?  ?  ?OT Problem List: Decreased activity tolerance;Impaired balance (sitting and/or standing);Decreased cognition;Decreased safety awareness;Decreased knowledge of use of DME or AE;Decreased knowledge of precautions;Cardiopulmonary status limiting activity;Obesity ?  ?   ?OT Treatment/Interventions:    ?  ?OT Goals(Current goals can be found in the care plan section) Acute Rehab OT Goals ?Patient Stated Goal: to eat lunch ?OT Goal Formulation: With patient ?Time For Goal Achievement: 04/27/21 ?Potential to Achieve Goals: Good  ?OT Frequency:   ?  ? ?Co-evaluation   ?  ?  ?  ?  ? ?  ?AM-PAC OT "6 Clicks" Daily Activity     ?Outcome Measure Help from another person eating meals?: None ?Help from another person taking care of personal grooming?: None ?Help from another person toileting, which includes using toliet, bedpan, or urinal?: A Little ?Help from another person bathing (including washing, rinsing, drying)?: A Little ?Help from another person to put on and taking off regular upper body clothing?: A Little ?Help from another person to put on and taking off regular lower body clothing?: A Little ?6 Click Score: 20 ?  ?  End of Session Equipment Utilized During Treatment: Gait belt ?Nurse Communication: Mobility status ? ?Activity Tolerance: Patient tolerated treatment well ?Patient left: in chair;with call bell/phone within reach;with chair alarm set;with family/visitor present ? ?OT Visit Diagnosis: Unsteadiness on feet (R26.81) ?Symptoms and signs involving cognitive functions: Nontraumatic SAH  ?              ?Time: [id number] ?OT Time Calculation (min): 19 min ?Charges:  OT General Charges ?$OT Visit: 1 Visit ?OT Evaluation ?$OT Eval Moderate  Complexity: 1 Mod ? ? ?Brynn, OTR/L  ?Acute Rehabilitation Services ?Pager: [phone] ?Office: [phone] ?. ? ? ?Jeri Modena ?04/13/2021, 4:32 PM ?

## 2021-04-14 LAB — GLUCOSE, CAPILLARY
Glucose-Capillary: 358 mg/dL — ABNORMAL HIGH (ref 70–99)
Glucose-Capillary: 311 mg/dL — ABNORMAL HIGH (ref 70–99)
Glucose-Capillary: 209 mg/dL — ABNORMAL HIGH (ref 70–99)

## 2021-04-14 MED ORDER — LEVETIRACETAM 250 MG PO TABS: 500.0000 mg | ORAL_TABLET | Freq: Two times a day (BID) | ORAL | Status: DC

## 2021-04-14 MED ORDER — INSULIN DETEMIR 100 UNIT/ML ~~LOC~~ SOLN
10.0000 [IU] | Freq: Once | SUBCUTANEOUS | Status: AC
  Administered 2021-04-14: 10 [IU] via SUBCUTANEOUS
  Filled 2021-04-14: qty 0.1

## 2021-04-14 MED ORDER — LEVETIRACETAM ER 500 MG PO TB24: 500.0000 mg | ORAL_TABLET | Freq: Every day | ORAL | Status: DC

## 2021-04-14 MED ORDER — LEVETIRACETAM ER 500 MG PO TB24: 1000.0000 mg | ORAL_TABLET | Freq: Every day | ORAL | Status: DC

## 2021-04-14 MED ORDER — ASPIRIN 81 MG PO TBEC: 81.0000 mg | DELAYED_RELEASE_TABLET | Freq: Every day | ORAL | 0 refills | Status: DC

## 2021-04-14 MED ORDER — INSULIN DETEMIR 100 UNIT/ML ~~LOC~~ SOLN
20.0000 [IU] | Freq: Two times a day (BID) | SUBCUTANEOUS | Status: DC
  Filled 2021-04-14: qty 0.2

## 2021-04-14 MED ORDER — ASPIRIN EC 81 MG PO TBEC
81.0000 mg | DELAYED_RELEASE_TABLET | Freq: Every day | ORAL | Status: DC
  Administered 2021-04-14: 81 mg via ORAL
  Filled 2021-04-14: qty 1

## 2021-04-14 MED ORDER — LEVETIRACETAM ER 1000 MG PO TB24: 1000.0000 mg | ORAL_TABLET | Freq: Every day | ORAL | 1 refills | Status: DC

## 2021-04-14 MED ORDER — RAMIPRIL 5 MG PO CAPS: 5.0000 mg | ORAL_CAPSULE | Freq: Every day | ORAL | 0 refills | Status: DC

## 2021-04-14 NOTE — Progress Notes (Signed)
Inpatient Diabetes Program Recommendations ? ?AACE/ADA: New Consensus Statement on Inpatient Glycemic Control (2015) ? ?Target Ranges:  Prepandial:   less than 140 mg/dL ?     Peak postprandial:   less than 180 mg/dL (1-2 hours) ?     Critically ill patients:  140 - 180 mg/dL  ? ?Lab Results  ?Component Value Date  ? GLUCAP 358 (H) 04/14/2021  ? HGBA1C 13.2 (H) 04/12/2021  ? ? ?Review of Glycemic Control ? Latest Reference Range & Units 04/13/21 10:17 04/13/21 11:49 04/13/21 16:12 04/13/21 21:49 04/14/21 06:47  ?Glucose-Capillary 70 - 99 mg/dL 271 (H) 300 (H) 391 (H) 281 (H) 358 (H)  ?(H): Data is abnormally high ? ?Diabetes history: DM2 ?Outpatient Diabetes medications: Levemir 24 units bid ?Current orders for Inpatient glycemic control: Levemir 10 units bid, Novolog correction tid 0-20 units ? ?Inpatient Diabetes Program Recommendations:   ?Has received 62 units Novoog correction over the past 24 hrs. ?Please consider: ?-Increase Levemir to 20 units bid ?Secure chat sent to Dr. Leonie Man. ? ?Thank you, ?Nani Gasser Jaeliana Lococo, RN, MSN, CDE  ?Diabetes Coordinator ?Inpatient Glycemic Control Team ?Team Pager [phone] (8am-5pm) ?04/14/2021 9:39 AM ? ? ? ?

## 2021-04-14 NOTE — Evaluation (Signed)
Speech Language Pathology Evaluation ?Patient Details ?Name: Caitlyn Fuller ?MRN: [id number] ?DOB: Nov 03, 1975 ?Today's Date: 04/14/2021 ?Time: 3664-4034 ?SLP Time Calculation (min) (ACUTE ONLY): 21 min ? ?Problem List:  ?Patient Active Problem List  ? Diagnosis Date Noted  ? ICH (intracerebral hemorrhage) (HCC) 04/12/2021  ? Hyperglycemia without ketosis   ? Chronic pain   ? Herpes zoster   ? Renal mass, left   ? Essential hypertension   ? Hyperglycemia 11/01/2014  ? ?Past Medical History:  ?Past Medical History:  ?Diagnosis Date  ? Asthma   ? Diabetes mellitus without complication (HCC)   ? Hernia   ? Hypertension   ? Kidney infection   ? Shingles   ? ?Past Surgical History:  ?Past Surgical History:  ?Procedure Laterality Date  ? ABDOMINAL SURGERY    ? CESAREAN SECTION    ? CHOLECYSTECTOMY    ? colonscopy    ? TUBAL LIGATION    ? ?HPI:  ?Patient is a 46 y/o female who presents on 4/4 after witnessed seizure activity by family with subsequent postictal period. + Cocaine + THC. [facility]- left temporal ICH with small IVH. PMH includes DM, HTN, shingles.  ? ?Assessment / Plan / Recommendation ?Clinical Impression ? Pt scored 17/30 on the SLUMS (25 or higher considered to be WNL, adjusted for level of education). She was impulsive at times, impacting her performance as she did not listen to all instructions before trying to proceed with tasks, but did show some self-awareness of this and made self-corrections. Throughout testing she had difficulty with working memory and short-term recall. She remembered 0/5 words on a delayed recall task, perseverated on the same few animals during the divergent naming task (and trying to use those words again later during the delayed recall subtest), and had trouble remembering specific details from paragraph level comprehension. She has limited insight into her memory difficulties, saying that she did not have any trouble on testing but then when errors were pointed out, she said it was  because she did not get enough sleep last night. SLP will f/u acutely, but also recommend OP SLP f/u and increased supervision at d/c to maximize cognition and safety. ?   ?SLP Assessment ? SLP Recommendation/Assessment: Patient needs continued Speech Lanaguage Pathology Services ?SLP Visit Diagnosis: Cognitive communication deficit (R41.841)  ?  ?Recommendations for follow up therapy are one component of a multi-disciplinary discharge planning process, led by the attending physician.  Recommendations may be updated based on patient status, additional functional criteria and insurance authorization. ?   ?Follow Up Recommendations ? Outpatient SLP  ?  ?Assistance Recommended at Discharge ? Frequent or constant Supervision/Assistance (upon initial return home, may be able to do intermittent)  ?Functional Status Assessment Patient has had a recent decline in their functional status and demonstrates the ability to make significant improvements in function in a reasonable and predictable amount of time.  ?Frequency and Duration min 2x/week  ?2 weeks ?  ?   ?SLP Evaluation ?Cognition ? Overall Cognitive Status: Impaired/Different from baseline ?Arousal/Alertness: Awake/alert ?Orientation Level: Oriented X4 ?Attention: Selective ?Selective Attention: Impaired ?Selective Attention Impairment: Verbal basic ?Memory: Impaired ?Memory Impairment: Retrieval deficit;Decreased recall of new information ?Awareness: Impaired ?Awareness Impairment: Intellectual impairment;Emergent impairment;Anticipatory impairment ?Problem Solving: Impaired ?Problem Solving Impairment: Verbal complex ?Behaviors: Impulsive ?Safety/Judgment: Impaired  ?  ?   ?Comprehension ? Auditory Comprehension ?Overall Auditory Comprehension: Appears within functional limits for tasks assessed  ?  ?Expression Expression ?Primary Mode of Expression: Verbal ?Verbal Expression ?Overall Verbal Expression:  Appears within functional limits for tasks assessed   ?Oral /  Motor ? Motor Speech ?Overall Motor Speech: Appears within functional limits for tasks assessed   ?        ? ?Caitlyn Fuller., M.A. CCC-SLP ?Acute Rehabilitation Services ?Office [phone] ? ?Secure chat preferred ? ?04/14/2021, 10:29 AM ? ?

## 2021-04-14 NOTE — TOC CAGE-AID Note (Signed)
Transition of Care (TOC) - CAGE-AID Screening ? ? ?Patient Details  ?Name: Caitlyn Fuller ?MRN: [id number] ?Date of Birth: 07-Oct-1975 ? ?Transition of Care (TOC) CM/SW Contact:    ?Hema Lanza C Tarpley-Carter, LCSWA ?Phone Number: ?04/14/2021, 2:47 PM ? ? ?Clinical Narrative: ?Pt participated in Cage-Aid.  Pt stated she does use substance.  Pt was offered resources, due to usage of substance.  ? ?Insurance underwriter, MSW, LCSW-A ?Pronouns:  She/Her/Hers ?[facility] ?Clinical Social Worker ?Direct Number:  [phone] ?[email or web address] ?   ? ? ?CAGE-AID Screening: ?  ? ?Have You Ever Felt You Ought to Cut Down on Your Drinking or Drug Use?: Yes ?Have People Annoyed You By Critizing Your Drinking Or Drug Use?: No ?Have You Felt Bad Or Guilty About Your Drinking Or Drug Use?: Yes ?Have You Ever Had a Drink or Used Drugs First Thing In The Morning to Steady Your Nerves or to Get Rid of a Hangover?: No ?CAGE-AID Score: 2 ? ?Substance Abuse Education Offered: Yes ? ?Substance abuse interventions: Educational Materials ? ? ? ? ? ? ?

## 2021-04-14 NOTE — Progress Notes (Signed)
Physical Therapy Treatment & Discharge ?Patient Details ?Name: Caitlyn Fuller ?MRN: [id number] ?DOB: [birth year] ?Today's Date: 04/14/2021 ? ? ?History of Present Illness Patient is a 46 y/o female who presents on 4/4 after witnessed seizure activity by family with subsequent postictal period. + Cocaine + THC. [place]- left temporal ICH with small IVH. PMH includes DM, HTN, shingles. ? ?  ?PT Comments  ? ? Pt is displaying improved functional mobility independence and safety, balance, and cognition this date. She was able to perform all functional mobility safely without UE support, assistance, or LOB. She is displaying safe static and dynamic balance, supported by her DGI score of 22 this date. She is A&Ox4 but still has some memory deficits. However, she was able to recall single step cues a few minutes later and path-find her way back to her room without cues using the signs in the halls. Coordinated with pt and her friend to have friend stay with pt during day while pt's girlfriend is at work to ensure pt safety due to cog deficits fist few days and to ask MD for OP OT or speech referral if cog does not improve on its own as pt is denying OP therapy and cannot get [facility] therapy. Educated pt and her friend to have someone else manage pt's meds, finances, and do the cooking for safety at this time. Educated pt on importance of lowering BP to reduce risk for future CVAs. All education completed and questions answered. As pt has met or adequately fulfilled her PT goals, no further PT deemed necessary, will sign off. ?   ?Recommendations for follow up therapy are one component of a multi-disciplinary discharge planning process, led by the attending physician.  Recommendations may be updated based on patient status, additional functional criteria and insurance authorization. ? ?Follow Up Recommendations ? No PT follow up ?  ?  ?Assistance Recommended at Discharge Intermittent Supervision/Assistance (initially)  ?Patient can  return home with the following Assistance with cooking/housework;Direct supervision/assist for medications management;Direct supervision/assist for financial management;Assist for transportation ?  ?Equipment Recommendations ? None recommended by PT  ?  ?Recommendations for Other Services   ? ? ?  ?Precautions / Restrictions Precautions ?Precautions: Other (comment) ?Precaution Comments: keep SBP <160 ?Restrictions ?Weight Bearing Restrictions: No  ?  ? ?Mobility ? Bed Mobility ?Overal bed mobility: Modified Independent ?  ?  ?  ?  ?  ?  ?General bed mobility comments: Able to perform all bed mobility aspects without assistance. ?  ? ?Transfers ?Overall transfer level: Independent ?Equipment used: None ?Transfers: Sit to/from Stand ?Sit to Stand: Independent ?  ?  ?  ?  ?  ?General transfer comment: Able to come to stand from various surfaces without assistance or LOB ?  ? ?Ambulation/Gait ?Ambulation/Gait assistance: Modified independent (Device/Increase time) ?Gait Distance (Feet): 350 Feet ?Assistive device: None ?Gait Pattern/deviations: Step-through pattern, Decreased stride length ?Gait velocity: reduced ?Gait velocity interpretation: >2.62 ft/sec, indicative of community ambulatory ?  ?General Gait Details: Pt with slightly slow self-selected gait speed with slight increase in speed when cued. Appears to be at her baseline gait pattern, no LOB even with DGI challenges. ? ? ?Stairs ?Stairs: Yes ?Stairs assistance: Min guard ?Stair Management: No rails, Alternating pattern, Step to pattern, Forwards ?Number of Stairs: 2 ?General stair comments: Ascended and descended x1 stair with step-to pattern and then x1 stair with reciprocal pattern, no LOB, min guard for safety ? ? ?Wheelchair Mobility ?  ? ?Modified Rankin (Stroke Patients Only) ?  Modified Rankin (Stroke Patients Only) ?Pre-Morbid Rankin Score: No symptoms ?Modified Rankin: Slight disability ? ? ?  ?Balance Overall balance assessment: No apparent balance  deficits (not formally assessed) (able to reach max off BOS and perform DGI challenges without LOB) ?  ?  ?  ?  ?  ?  ?  ?  ?  ?  ?  ?  ?  ?  ?  ?Standardized Balance Assessment ?Standardized Balance Assessment : Dynamic Gait Index ?  ?Dynamic Gait Index ?Level Surface: Normal ?Change in Gait Speed: Mild Impairment ?Gait with Horizontal Head Turns: Normal ?Gait with Vertical Head Turns: Normal ?Gait and Pivot Turn: Normal ?Step Over Obstacle: Mild Impairment ?Step Around Obstacles: Normal ?Steps: Normal ?Total Score: 22 ?  ? ?  ?Cognition Arousal/Alertness: Awake/alert ?Behavior During Therapy: [facility] for tasks assessed/performed ?Overall Cognitive Status: Impaired/Different from baseline ?Area of Impairment: Memory ?  ?  ?  ?  ?  ?  ?  ?  ?  ?  ?Memory: Decreased short-term memory ?  ?  ?  ?  ?General Comments: Pt with mild memory deficits noted, but recalls directions provided even several min later today. Able to pathfind her way back to her room using signs without cues. A&Ox4. Educated pt to have girlfriend or friend stay with her first few days to ensure pt safety cognitively and to manage cooking, finances, and her meds with a pill box for pt safey ?  ?  ? ?  ?Exercises   ? ?  ?General Comments General comments (skin integrity, edema, etc.): BP elevated at 174/106 start of session sitting EOB, RN administered meds to lower BP, clearing pt for session with PT; coordinated with pt and her friend to have friend stay with pt during day while pt's girlfriend is at work to ensure pt safety due to cog deficits fist few days and to ask MD for OP OT or speech referral if cog does not improve on its own as pt is denying OP therapy and cannot get HH therapy; educated pt and her friend to have someone else manage pt's meds, finances, and do the cooking for safety at this time; educated pt on importance of lowering BP to reduce risk for future CVAs ?  ?  ? ?Pertinent Vitals/Pain Pain Assessment ?Pain Assessment: Faces ?Faces  Pain Scale: No hurt ?Pain Intervention(s): Monitored during session  ? ? ?Home Living   ?  ?  ?  ?  ?  ?  ?  ?  ?  ?   ?  ?Prior Function    ?  ?  ?   ? ?PT Goals (current goals can now be found in the care plan section) Acute Rehab PT Goals ?Patient Stated Goal: to go home ?PT Goal Formulation: All assessment and education complete, DC therapy ?Time For Goal Achievement: 04/27/21 ?Potential to Achieve Goals: Good ?Progress towards PT goals: Goals met/education completed, patient discharged from PT ? ?  ?Frequency ? ? ? Min 1X/week ? ? ? ?  ?PT Plan Discharge plan needs to be updated;Frequency needs to be updated  ? ? ?Co-evaluation   ?  ?  ?  ?  ? ?  ?AM-PAC PT "6 Clicks" Mobility   ?Outcome Measure ? Help needed turning from your back to your side while in a flat bed without using bedrails?: None ?Help needed moving from lying on your back to sitting on the side of a flat bed without using bedrails?: None ?Help needed moving  to and from a bed to a chair (including a wheelchair)?: None ?Help needed standing up from a chair using your arms (e.g., wheelchair or bedside chair)?: None ?Help needed to walk in hospital room?: None ?Help needed climbing 3-5 steps with a railing? : A Little ?6 Click Score: 23 ? ?  ?End of Session   ?Activity Tolerance: Patient tolerated treatment well ?Patient left: in chair;with call bell/phone within reach;with family/visitor present ?Nurse Communication: Mobility status ?PT Visit Diagnosis: Other symptoms and signs involving the nervous system (R29.898) ?  ? ? ?Time: 6203-5597 ?PT Time Calculation (min) (ACUTE ONLY): 31 min ? ?Charges:  $Gait Training: 8-22 mins ?$Therapeutic Activity: 8-22 mins          ?          ? ?Moishe Spice, PT, DPT ?Acute Rehabilitation Services  ?Pager: [phone] ?Office: [phone] ? ? ? ?Maretta Bees Pettis ?04/14/2021, 3:06 PM ? ?

## 2021-04-14 NOTE — TOC Transition Note (Signed)
Transition of Care (TOC) - CM/SW Discharge Note ? ? ?Patient Details  ?Name: Caitlyn Fuller ?MRN: [id number] ?Date of Birth: 1975-02-02 ? ?Transition of Care (TOC) CM/SW Contact:  ?Kermit Balo, RN ?Phone Number: ?04/14/2021, 3:55 PM ? ? ?Clinical Narrative:    ?Patient discharging home with self care. Recommendations for outpatient therapy but patient refused. CM is unable to arrange [facility] services. CM has asked PT/OT to provide exercise for her to work on at home.  ?Pt without a PCP. CM was able to get her an appointment and placed it on the AVS. ?Pt has transportation home.  ? ? ?Final next level of care: Home/Self Care ?Barriers to Discharge: No Barriers Identified ? ? ?Patient Goals and CMS Choice ?  ?  ?  ? ?Discharge Placement ?  ?           ?  ?  ?  ?  ? ?Discharge Plan and Services ?  ?  ?           ?  ?  ?  ?  ?  ?  ?  ?  ?  ?  ? ?Social Determinants of Health (SDOH) Interventions ?  ? ? ?Readmission Risk Interventions ?   ? View : No data to display.  ?  ?  ?  ? ? ? ? ? ?

## 2021-04-14 NOTE — Discharge Summary (Addendum)
Stroke Discharge Summary  ?Patient ID: Caitlyn Fuller    l ?  MRN: [id number]    ?  DOB: April 13, 1975 ? ?Date of Admission: 04/12/2021 ?Date of Discharge: 04/15/2021 ? ?Attending Physician:  No att. providers found, Stroke MD ?Consultant(s):    None  ?Patient's PCP:  Patient, No Pcp Per (Inactive) ? 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 ?Cocaine and marijuana abuse ?Seizure ?Diabetes uncontrolled with hyperglycemia ?Hyperlipidemia ?Hypertension ?Obesity ? ?Allergies as of 04/14/2021   ? ?   Reactions  ? Amlodipine Swelling  ? Gabapentin Anaphylaxis  ? Morphine And Related Anaphylaxis  ? Darvocet [propoxyphene N-acetaminophen] Hives  ? Toradol [ketorolac Tromethamine] Other (See Comments)  ? migraines  ? Ultram [tramadol] Hives  ? Vancomycin Itching  ? ?  ? ?  ?Medication List  ?  ? ?STOP taking these medications   ? ?furosemide 20 MG tablet ?Commonly known as: Lasix ?  ?insulin detemir 100 UNIT/ML FlexPen ?Commonly known as: Levemir FlexTouch ?  ? ?  ? ?TAKE these medications   ? ?aspirin 81 MG EC tablet ?Take 1 tablet (81 mg total) by mouth daily. Swallow whole. ?  ?insulin glargine 100 UNIT/ML injection ?Commonly known as: LANTUS ?Inject 15 Units into the skin daily. ?  ?levETIRAcetam ER 1000 MG Tb24 ?Take 1,000 mg by mouth daily. ?  ?oxyCODONE 15 MG immediate release tablet ?Commonly known as: ROXICODONE ?Take 1 tablet (15 mg total) every 8 (eight) hours as needed by mouth for pain. ?  ?ramipril 5 MG capsule ?Commonly known as: ALTACE ?Take 1 capsule (5 mg total) by mouth daily. ?  ? ?  ? ? ?LABORATORY STUDIES ?CBC ?   ?Component Value Date/Time  ? WBC 15.1 (H) 04/12/2021 1338  ? RBC 5.93 (H) 04/12/2021 1338  ? HGB 15.9 (H) 04/12/2021 1338  ? HGB 12.5 08/02/2013 0412  ? HCT 49.0 (H) 04/12/2021 1338  ? HCT 38.7 08/02/2013 0412  ? PLT 249 04/12/2021 1338  ? PLT 225 08/02/2013 0412  ? MCV 82.6 04/12/2021  1338  ? MCV 84 08/02/2013 0412  ? MCH 26.8 04/12/2021 1338  ? MCHC 32.4 04/12/2021 1338  ? RDW 13.2 04/12/2021 1338  ? RDW 14.9 (H) 08/02/2013 0412  ? LYMPHSABS 3.1 04/12/2021 0451  ? LYMPHSABS 2.8 08/02/2013 0412  ? MONOABS 0.7 04/12/2021 0451  ? MONOABS 0.6 08/02/2013 0412  ? EOSABS 0.1 04/12/2021 0451  ? EOSABS 0.2 08/02/2013 0412  ? BASOSABS 0.1 04/12/2021 0451  ? BASOSABS 0.0 08/02/2013 0412  ? ?CMP ?   ?Component Value Date/Time  ? NA 137 04/12/2021 1338  ? NA 132 (L) 08/02/2013 0412  ? K 3.5 04/12/2021 1338  ? K 4.0 08/02/2013 0412  ? CL 100 04/12/2021 1338  ? CL 96 (L) 08/02/2013 0412  ? CO2 27 04/12/2021 1338  ? CO2 30 08/02/2013 0412  ? GLUCOSE 340 (H) 04/12/2021 1338  ? GLUCOSE 315 (H) 08/02/2013 0412  ? BUN 10 04/12/2021 1338  ? BUN 11 08/02/2013 0412  ? CREATININE 0.72 04/12/2021 1338  ? CREATININE 0.78 08/02/2013 0412  ? CALCIUM 8.9 04/12/2021 1338  ? CALCIUM 7.7 (L) 08/02/2013 0412  ? PROT 7.2 04/12/2021 1338  ? PROT 7.3 08/01/2013 1655  ? ALBUMIN 3.5 04/12/2021 1338  ? ALBUMIN 2.7 (L) 08/01/2013 1655  ? AST 18 04/12/2021 1338  ? AST 33 08/01/2013 1655  ? ALT 18 04/12/2021 1338  ? ALT 31 08/01/2013 1655  ?  ALKPHOS 80 04/12/2021 1338  ? ALKPHOS 64 08/01/2013 1655  ? BILITOT 0.9 04/12/2021 1338  ? BILITOT 0.6 08/01/2013 1655  ? GFRNONAA >60 04/12/2021 1338  ? GFRNONAA >60 08/02/2013 0412  ? GFRAA >60 05/12/2018 0001  ? GFRAA >60 08/02/2013 0412  ? ?COAGS ?Lab Results  ?Component Value Date  ? INR 1.0 04/12/2021  ? INR 1.0 04/12/2021  ? ?Lipid Panel ?   ?Component Value Date/Time  ? CHOL 179 04/12/2021 1338  ? TRIG 116 04/12/2021 1338  ? HDL 58 04/12/2021 1338  ? CHOLHDL 3.1 04/12/2021 1338  ? VLDL 23 04/12/2021 1338  ? [place] 98 04/12/2021 1338  ? ?HgbA1C  ?Lab Results  ?Component Value Date  ? HGBA1C 13.2 (H) 04/12/2021  ? ?Urinalysis ?   ?Component Value Date/Time  ? COLORURINE YELLOW (A) 04/12/2021 0506  ? APPEARANCEUR CLEAR (A) 04/12/2021 0506  ? APPEARANCEUR Clear 08/01/2013 1655  ? LABSPEC 1.029  04/12/2021 0506  ? LABSPEC 1.026 08/01/2013 1655  ? PHURINE 6.0 04/12/2021 0506  ? GLUCOSEU >=500 (A) 04/12/2021 0506  ? GLUCOSEU >=500 08/01/2013 1655  ? [place] NEGATIVE 04/12/2021 0506  ? [place] NEGATIVE 04/12/2021 0506  ? BILIRUBINUR Negative 08/01/2013 1655  ? [place] NEGATIVE 04/12/2021 0506  ? PROTEINUR 100 (A) 04/12/2021 0506  ? UROBILINOGEN 0.2 11/01/2014 2153  ? NITRITE NEGATIVE 04/12/2021 0506  ? LEUKOCYTESUR NEGATIVE 04/12/2021 0506  ? LEUKOCYTESUR Negative 08/01/2013 1655  ? ?Urine Drug Screen  ?   ?Component Value Date/Time  ? [place] DETECTED 04/12/2021 0506  ? [place] DETECTED 03/18/2012 0440  ? COCAINSCRNUR POSITIVE (A) 04/12/2021 0506  ? [place] DETECTED 04/12/2021 0506  ? LABBENZ POSITIVE (A) 03/18/2012 0440  ? AMPHETMU NONE DETECTED 04/12/2021 0506  ? AMPHETMU NONE DETECTED 03/18/2012 0440  ? THCU POSITIVE (A) 04/12/2021 0506  ? [place] DETECTED 03/18/2012 0440  ? [place] DETECTED 04/12/2021 0506  ? [place] DETECTED 03/18/2012 0440  ?  ?Alcohol Level ?   ?Component Value Date/Time  ? ETH <10 04/12/2021 0451  ? ? ? ?SIGNIFICANT DIAGNOSTIC STUDIES ?CT ANGIO HEAD NECK W WO CM ? ?Result Date: 04/12/2021 ?CLINICAL DATA:  Stroke follow-up.  Seizure-like activity EXAM: CT ANGIOGRAPHY HEAD AND NECK TECHNIQUE: Multidetector CT imaging of the head and neck was performed using the standard protocol during bolus administration of intravenous contrast. Multiplanar CT image reconstructions and MIPs were obtained to evaluate the vascular anatomy. Carotid stenosis measurements (when applicable) are obtained utilizing NASCET criteria, using the distal internal carotid diameter as the denominator. RADIATION DOSE REDUCTION: This exam was performed according to the departmental dose-optimization program which includes automated exposure control, adjustment of the mA and/or kV according to patient size and/or use of iterative reconstruction technique. CONTRAST:  10mL OMNIPAQUE IOHEXOL  350 MG/ML SOLN COMPARISON:  Head CT from earlier today FINDINGS: CTA NECK FINDINGS Aortic arch: Normal with 3 vessel branching Right carotid system: Vessels are smoothly contoured and diffusely patent. ICA tortuosity Left carotid system: Vessels are smoothly contoured and widely patent. ICA tortuosity. Vertebral arteries: No proximal subclavian stenosis. Right dominant vertebral artery. Skeleton: No acute or aggressive finding Other neck: Congested appearance of nasal mucosa.  No acute finding Upper chest: Clear apical lungs Review of the MIP images confirms the above findings CTA HEAD FINDINGS Anterior circulation: No aneurysm or vasculopathy seen underlying the intracranial hemorrhage. No evidence of vascular malformation or flow limiting stenosis. Posterior circulation: No aneurysm or vascular malformation to explain the intracranial hemorrhage. Negative for spot sign. Negative  for stenosis or branch occlusion. Venous sinuses: Negative Anatomic variants: Hypoplastic left A1 segment and fetal type left PCA. Review of the MIP images confirms the above findings IMPRESSION: Negative CTA.  No explanation for the intracranial hemorrhage. Electronically Signed   By: Jorje Guild M.D.   On: 04/12/2021 06:59  ? ?CT Head Wo Contrast ? ?Result Date: 04/12/2021 ?CLINICAL DATA:  Altered mental status, known hemorrhage EXAM: CT HEAD WITHOUT CONTRAST TECHNIQUE: Contiguous axial images were obtained from the base of the skull through the vertex without intravenous contrast. RADIATION DOSE REDUCTION: This exam was performed according to the departmental dose-optimization program which includes automated exposure control, adjustment of the mA and/or kV according to patient size and/or use of iterative reconstruction technique. COMPARISON:  CT head obtained earlier the same morning FINDINGS: Brain: The 1.2 cm x 1.0 cm by 0.9 cm intraparenchymal hemorrhage centered in the left anterior temporal lobe is not significantly changed.  There is a small amount of blood in the adjacent temporal horn of the left lateral ventricle, also unchanged. There is no significant surrounding edema. There is no new intracranial hemorrhage. There is no

## 2021-04-14 NOTE — Plan of Care (Signed)
Progressing, pt. Verbalizes education and practices teach back- ?

## 2021-05-02 ENCOUNTER — Emergency Department: Payer: Medicaid Other

## 2021-05-02 ENCOUNTER — Other Ambulatory Visit: Payer: Self-pay

## 2021-05-02 ENCOUNTER — Emergency Department
Admission: EM | Admit: 2021-05-02 | Discharge: 2021-05-02 | Disposition: A | Discharge status: Home / Self Care | Payer: Medicaid Other | Attending: Emergency Medicine | Admitting: Emergency Medicine

## 2021-05-02 ENCOUNTER — Encounter: Payer: Self-pay | Admitting: Radiology

## 2021-05-02 DIAGNOSIS — R4182 Altered mental status, unspecified: Secondary | ICD-10-CM | POA: Diagnosis present

## 2021-05-02 DIAGNOSIS — Z79899 Other long term (current) drug therapy: Secondary | ICD-10-CM | POA: Diagnosis not present

## 2021-05-02 DIAGNOSIS — Z20822 Contact with and (suspected) exposure to covid-19: Secondary | ICD-10-CM | POA: Diagnosis not present

## 2021-05-02 DIAGNOSIS — R569 Unspecified convulsions: Secondary | ICD-10-CM | POA: Diagnosis not present

## 2021-05-02 DIAGNOSIS — E1165 Type 2 diabetes mellitus with hyperglycemia: Secondary | ICD-10-CM | POA: Insufficient documentation

## 2021-05-02 DIAGNOSIS — G8929 Other chronic pain: Secondary | ICD-10-CM | POA: Insufficient documentation

## 2021-05-02 DIAGNOSIS — R778 Other specified abnormalities of plasma proteins: Secondary | ICD-10-CM | POA: Insufficient documentation

## 2021-05-02 DIAGNOSIS — J45909 Unspecified asthma, uncomplicated: Secondary | ICD-10-CM | POA: Insufficient documentation

## 2021-05-02 DIAGNOSIS — I10 Essential (primary) hypertension: Secondary | ICD-10-CM | POA: Diagnosis not present

## 2021-05-02 DIAGNOSIS — M549 Dorsalgia, unspecified: Secondary | ICD-10-CM | POA: Insufficient documentation

## 2021-05-02 DIAGNOSIS — Z794 Long term (current) use of insulin: Secondary | ICD-10-CM | POA: Insufficient documentation

## 2021-05-02 DIAGNOSIS — R5383 Other fatigue: Secondary | ICD-10-CM | POA: Diagnosis not present

## 2021-05-02 DIAGNOSIS — R825 Elevated urine levels of drugs, medicaments and biological substances: Secondary | ICD-10-CM | POA: Diagnosis not present

## 2021-05-02 DIAGNOSIS — Z7982 Long term (current) use of aspirin: Secondary | ICD-10-CM | POA: Insufficient documentation

## 2021-05-02 DIAGNOSIS — R0789 Other chest pain: Secondary | ICD-10-CM | POA: Diagnosis not present

## 2021-05-02 LAB — CBC WITH DIFFERENTIAL/PLATELET
Abs Immature Granulocytes: 0.03 10*3/uL (ref 0.00–0.07)
Basophils Absolute: 0 10*3/uL (ref 0.0–0.1)
Basophils Relative: 0 %
Eosinophils Absolute: 0.2 10*3/uL (ref 0.0–0.5)
Eosinophils Relative: 2 %
HCT: 45.2 % (ref 36.0–46.0)
Hemoglobin: 14.2 g/dL (ref 12.0–15.0)
Immature Granulocytes: 0 %
Lymphocytes Relative: 30 %
Lymphs Abs: 2.7 10*3/uL (ref 0.7–4.0)
MCH: 26.5 pg (ref 26.0–34.0)
MCHC: 31.4 g/dL (ref 30.0–36.0)
MCV: 84.3 fL (ref 80.0–100.0)
Monocytes Absolute: 0.6 10*3/uL (ref 0.1–1.0)
Monocytes Relative: 7 %
Neutro Abs: 5.5 10*3/uL (ref 1.7–7.7)
Neutrophils Relative %: 61 %
Platelets: 245 10*3/uL (ref 150–400)
RBC: 5.36 MIL/uL — ABNORMAL HIGH (ref 3.87–5.11)
RDW: 13.2 % (ref 11.5–15.5)
WBC: 9.1 10*3/uL (ref 4.0–10.5)
nRBC: 0 % (ref 0.0–0.2)

## 2021-05-02 LAB — RESP PANEL BY RT-PCR (FLU A&B, COVID) ARPGX2
Influenza A by PCR: NEGATIVE
Influenza B by PCR: NEGATIVE
SARS Coronavirus 2 by RT PCR: NEGATIVE

## 2021-05-02 LAB — COMPREHENSIVE METABOLIC PANEL
ALT: 18 U/L (ref 0–44)
AST: 17 U/L (ref 15–41)
Albumin: 3.3 g/dL — ABNORMAL LOW (ref 3.5–5.0)
Alkaline Phosphatase: 59 U/L (ref 38–126)
Anion gap: 8 (ref 5–15)
BUN: 24 mg/dL — ABNORMAL HIGH (ref 6–20)
CO2: 31 mmol/L (ref 22–32)
Calcium: 9 mg/dL (ref 8.9–10.3)
Chloride: 92 mmol/L — ABNORMAL LOW (ref 98–111)
Creatinine, Ser: 0.73 mg/dL (ref 0.44–1.00)
GFR, Estimated: >60 mL/min (ref >60)
Glucose, Bld: 359 mg/dL — ABNORMAL HIGH (ref 70–99)
Potassium: 4.3 mmol/L (ref 3.5–5.1)
Sodium: 131 mmol/L — ABNORMAL LOW (ref 135–145)
Total Bilirubin: 0.6 mg/dL (ref 0.3–1.2)
Total Protein: 6.9 g/dL (ref 6.5–8.1)

## 2021-05-02 LAB — URINE DRUG SCREEN, QUALITATIVE (ARMC ONLY)
Amphetamines, Ur Screen: NOT DETECTED
Barbiturates, Ur Screen: NOT DETECTED
Benzodiazepine, Ur Scrn: NOT DETECTED
Cannabinoid 50 Ng, Ur ~~LOC~~: NOT DETECTED
Cocaine Metabolite,Ur ~~LOC~~: NOT DETECTED
MDMA (Ecstasy)Ur Screen: NOT DETECTED
Methadone Scn, Ur: NOT DETECTED
Opiate, Ur Screen: POSITIVE — AB
Phencyclidine (PCP) Ur S: NOT DETECTED
Tricyclic, Ur Screen: NOT DETECTED

## 2021-05-02 LAB — ETHANOL: Alcohol, Ethyl (B): <10 mg/dL (ref <10)

## 2021-05-02 LAB — URINALYSIS, ROUTINE W REFLEX MICROSCOPIC
Bacteria, UA: NONE SEEN
Bilirubin Urine: NEGATIVE
Glucose, UA: >=500 mg/dL — AB
Ketones, ur: NEGATIVE mg/dL
Leukocytes,Ua: NEGATIVE
Nitrite: NEGATIVE
Protein, ur: 30 mg/dL — AB
Specific Gravity, Urine: 1.013 (ref 1.005–1.030)
pH: 7 (ref 5.0–8.0)

## 2021-05-02 LAB — PROTIME-INR
INR: 1 (ref 0.8–1.2)
Prothrombin Time: 12.7 seconds (ref 11.4–15.2)

## 2021-05-02 LAB — APTT: aPTT: 27 seconds (ref 24–36)

## 2021-05-02 LAB — TROPONIN I (HIGH SENSITIVITY)
Troponin I (High Sensitivity): 19 ng/L — ABNORMAL HIGH (ref <18)
Troponin I (High Sensitivity): 23 ng/L — ABNORMAL HIGH (ref <18)

## 2021-05-02 LAB — AMMONIA: Ammonia: 26 umol/L (ref 9–35)

## 2021-05-02 MED ORDER — FENTANYL CITRATE PF 50 MCG/ML IJ SOSY
50.0000 ug | PREFILLED_SYRINGE | Freq: Once | INTRAMUSCULAR | Status: AC
  Administered 2021-05-02: 50 ug via INTRAVENOUS
  Filled 2021-05-02: qty 1

## 2021-05-02 MED ORDER — ONDANSETRON HCL 4 MG/2ML IJ SOLN
4.0000 mg | Freq: Once | INTRAMUSCULAR | Status: AC
  Administered 2021-05-02: 4 mg via INTRAVENOUS
  Filled 2021-05-02: qty 2

## 2021-05-02 MED ORDER — OXYCODONE-ACETAMINOPHEN 5-325 MG PO TABS
2.0000 | ORAL_TABLET | Freq: Once | ORAL | Status: AC
  Administered 2021-05-02: 2 via ORAL
  Filled 2021-05-02: qty 2

## 2021-05-02 MED ORDER — LEVETIRACETAM IN NACL 1000 MG/100ML IV SOLN
1000.0000 mg | Freq: Once | INTRAVENOUS | Status: AC
Start: 2021-05-02 — End: 2021-05-02
  Administered 2021-05-02: 1000 mg via INTRAVENOUS
  Filled 2021-05-02: qty 100

## 2021-05-02 MED ORDER — HYDROMORPHONE HCL 1 MG/ML IJ SOLN
1.0000 mg | Freq: Once | INTRAMUSCULAR | Status: AC
  Administered 2021-05-02: 1 mg via INTRAVENOUS
  Filled 2021-05-02: qty 1

## 2021-05-02 NOTE — ED Triage Notes (Addendum)
BIB [facility] from home due to AMS since 3AM; pt was up walking around house and woke family up. EMS reports LVO of 1 initially, mixing up words ? ?CBG 384, high BP ? ?Last LVO with EMS 0. EMS questions if pt is postictal, family unsure if pt had seizure or not. No weakness, follows commands. ? ?Hx of stroke 2-3wks ago with no known deficits; also had seizure at time of stroke ? ?On arrival pt alert and follows commands but confused. Disoriented to time, place and situation. Oriented to person. ?

## 2021-05-02 NOTE — Discharge Instructions (Addendum)
No driving for 6 months after most recent seizure or spell, per [place] law.   Avoid activities that are potentially dangerous if you were to have another seizure, including operating heavy machinery, swimming, taking baths, climbing heights. Please use direct supervision around stoves, ovens, fireplaces, campfires, or other sources of heat or fire.    Make sure you are taking medications as directed, avoiding alcohol and drug use, getting adequate sleep and eating regular meals.   

## 2021-05-02 NOTE — ED Provider Notes (Signed)
? ?[facility] ?Provider Note ? ? ? Event Date/Time  ? First MD Initiated Contact with Patient 05/02/21 0407   ?  (approximate) ? ? ?History  ? ?Altered Mental Status ? ? ?HPI ? ?Caitlyn Fuller is a 46 y.o. 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  Patient does not recall what happened tonight.  Daughter states that she lives with the patient and saw her around midnight before she went to bed and was in her normal state of health.  She states she got up around 3 AM and found her mother pacing in the living room and she had been incontinent of urine.  She states that she appeared confused and was not able to answer questions.  On arrival to the ED she is confused but seems to be improving slowly per EMS.  Normal blood glucose with EMS.  No witnessed seizure today.  Patient is unable to tell me what happened.  She denies any head injury, headache.  No numbness, weakness.  No vomiting or diarrhea.  Did tell nursing staff that she had some chest tightness that has improved.  No shortness of breath.  Complaining of chronic back pain which is unchanged. ? ?Per recent hospitalization the beginning of April, patient "experienced a witnessed episode of generalized tonic clonic activity that family says lasted 10 minutes. She was initially lethargic and confused but her mental status improved. Routine EEG was negative for electrographic seizure or epileptiform discharge. MRI-Brain notable for multifocal infarcts in the right hemisphere involving insula and frontal operculum and area of hemorrhage in the L medial temporal lobe with trace intraventricular extension. UDS was positive for marijuana and cocaine. 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 ? ?History provided by patient, daughter, EMS. ? ? ? ?Past Medical History:  ?Diagnosis Date  ? Asthma   ? Diabetes mellitus without complication (HCC)   ? Hernia   ? Hypertension   ? Kidney infection   ? Shingles   ? ? ?Past Surgical History:  ?Procedure Laterality Date  ? ABDOMINAL SURGERY    ? CESAREAN SECTION    ? CHOLECYSTECTOMY    ? colonscopy    ? TUBAL LIGATION    ? ? ?MEDICATIONS:  ?Prior to Admission medications   ?Medication Sig Start Date End Date Taking? Authorizing Provider  ?aspirin EC 81 MG EC tablet Take 1 tablet (81 mg total) by mouth daily. Swallow whole. 04/15/21   Carlyn Reichert, MD  ?insulin glargine (LANTUS) 100 UNIT/ML injection Inject 15 Units into the skin daily.    [provider]  ?levETIRAcetam 1000 MG TB24 Take 1,000 mg by mouth daily. 04/15/21   Carlyn Reichert, MD  ?oxyCODONE (ROXICODONE) 15 MG immediate release tablet Take 1 tablet (15 mg total) every 8 (eight) hours as needed by mouth for pain. ?Patient not taking: Reported on 04/12/2021 11/16/16   Fayrene Helper, PA-C  ?ramipril (ALTACE) 5 MG capsule Take 1 capsule (5 mg total) by mouth daily. 04/15/21   Carlyn Reichert, MD  ? ? ?Physical Exam  ? ?Triage Vital Signs: ?ED Triage Vitals  ?Enc Vitals Group  ?   BP 05/02/21 0358 (!) 170/118  ?   Pulse Rate 05/02/21  0358 84  ?   Resp 05/02/21 0358 18  ?   Temp 05/02/21 0408 98.1 ?F (36.7 ?C)  ?   Temp Source 05/02/21 0408 Oral  ?   SpO2 05/02/21 0358 100 %  ?   Weight 05/02/21 0357 216 lb 14.9 oz (98.4 kg)  ?   Height 05/02/21 0357 4\' 10"  (1.473 m)  ?   Head Circumference --   ?   Peak Flow --   ?   Pain Score 05/02/21 0356 0  ?   Pain Loc --   ?   Pain Edu? --   ?   Excl. in GC? --   ? ? ?Most recent vital signs: ?Vitals:  ? 05/02/21 0530 05/02/21 0626  ?BP: (!) 151/79 (!) 158/107  ?Pulse: 78 96  ?Resp: 12 16  ?Temp:    ?SpO2: 92% 100%  ? ? ?CONSTITUTIONAL: Initially patient was oriented to person only.  She  states that she thinks she is at the "sweepstakes".  Over time patient is now oriented x3.  Obese. ?HEAD: Normocephalic, atraumatic ?EYES: Conjunctivae clear, pupils appear equal, sclera nonicteric ?ENT: normal nose; moist mucous membranes, no dental injury, no tongue injury ?NECK: Supple, normal ROM, no midline spinal tenderness or step-off or deformity ?CARD: RRR; S1 and S2 appreciated; no murmurs, no clicks, no rubs, no gallops ?CHEST:  Chest wall is tender to palpation.  No crepitus, ecchymosis, erythema, warmth, rash or other lesions present.   ?RESP: Normal chest excursion without splinting or tachypnea; breath sounds clear and equal bilaterally; no wheezes, no rhonchi, no rales, no hypoxia or respiratory distress, speaking full sentences ?ABD/GI: Normal bowel sounds; non-distended; soft, non-tender, no rebound, no guarding, no peritoneal signs ?BACK: The back appears normal, no midline spinal tenderness or step-off or deformity ?EXT: Normal ROM in all joints; no deformity noted, no edema; no cyanosis; no calf tenderness or calf swelling ?SKIN: Normal color for age and race; warm; no rash on exposed skin ?NEURO: Moves all extremities equally, normal speech, cranial nerves II through XII intact, no pronator drift, normal sensation diffusely, normal visual fields ?PSYCH: The patient's mood and manner are appropriate. ? ? ?ED Results / Procedures / Treatments  ? ?LABS: ?(all labs ordered are listed, but only abnormal results are displayed) ?Labs Reviewed  ?URINE DRUG SCREEN, QUALITATIVE ([facility] ONLY) - Abnormal; Notable for the following components:  ?    Result Value  ? Opiate, Ur Screen POSITIVE (*)   ? All other components within normal limits  ?URINALYSIS, ROUTINE W REFLEX MICROSCOPIC - Abnormal; Notable for the following components:  ? Color, Urine STRAW (*)   ? APPearance CLEAR (*)   ? Glucose, UA >=500 (*)   ? Hgb urine dipstick LARGE (*)   ? Protein, ur 30 (*)   ? All other components within normal limits   ?COMPREHENSIVE METABOLIC PANEL - Abnormal; Notable for the following components:  ? Sodium 131 (*)   ? Chloride 92 (*)   ? Glucose, Bld 359 (*)   ? BUN 24 (*)   ? Albumin 3.3 (*)   ? All other components within normal limits  ?CBC WITH DIFFERENTIAL/PLATELET - Abnormal; Notable for the following components:  ? RBC 5.36 (*)   ? All other components within normal limits  ?TROPONIN I (HIGH SENSITIVITY) - Abnormal; Notable for the following components:  ? Troponin I (High Sensitivity) 19 (*)   ? All other components within normal limits  ?RESP PANEL BY RT-PCR (FLU A&B, COVID) ARPGX2  ?  ETHANOL  ?PROTIME-INR  ?APTT  ?AMMONIA  ?TROPONIN I (HIGH SENSITIVITY)  ? ? ? ?EKG: ? ? ? Date: 05/02/2021 4:03 AM ? Rate: 82 ? Rhythm: normal sinus rhythm ? QRS Axis: normal ? Intervals: normal ? ST/T Wave abnormalities: normal ? Conduction Disutrbances: none ? Narrative Interpretation: Q waves in anterior leads suggestive of old anterior infarct ? ? ? ? ?RADIOLOGY: ?My personal review and interpretation of imaging: CT head and MRI brain showed no acute abnormality. ? ?I have personally reviewed all radiology reports.   ?CT HEAD WO CONTRAST ? ?Result Date: 05/02/2021 ?CLINICAL DATA:  Mental status change, unknown cause. EXAM: CT HEAD WITHOUT CONTRAST TECHNIQUE: Contiguous axial images were obtained from the base of the skull through the vertex without intravenous contrast. RADIATION DOSE REDUCTION: This exam was performed according to the departmental dose-optimization program which includes automated exposure control, adjustment of the mA and/or kV according to patient size and/or use of iterative reconstruction technique. COMPARISON:  Head CT and CTA images, MR brain, all 04/12/21 FINDINGS: Brain: Previously there was a 1.2 cm hematoma in the anteromedial left temporal lobe with intraventricular extension. On the current exam, the focal hemorrhage is no longer seen with 7 mm lacunar debris containing cavity in its prior location. There is  scattered hyperdensity along its walls which is probably hemosiderin staining. There is a 6 mm hyperdensity in the inferior aspect of the temporal horn which probably reflects minimal residual blood product

## 2021-05-02 NOTE — ED Notes (Signed)
Pt able to walk to toilet and back w/stand-by assist.  Gait steady. ?

## 2021-09-14 ENCOUNTER — Other Ambulatory Visit: Payer: Self-pay | Admitting: Nurse Practitioner

## 2021-09-14 DIAGNOSIS — Z1231 Encounter for screening mammogram for malignant neoplasm of breast: Secondary | ICD-10-CM

## 2021-09-25 ENCOUNTER — Inpatient Hospital Stay
Admission: EM | Admit: 2021-09-25 | Discharge: 2021-09-30 | DRG: 158 | Disposition: A | Discharge status: Home / Self Care | Payer: Medicaid Other | Attending: Internal Medicine | Admitting: Internal Medicine

## 2021-09-25 ENCOUNTER — Emergency Department: Payer: Medicaid Other

## 2021-09-25 ENCOUNTER — Other Ambulatory Visit: Payer: Self-pay

## 2021-09-25 ENCOUNTER — Encounter: Payer: Self-pay | Admitting: Internal Medicine

## 2021-09-25 DIAGNOSIS — L03211 Cellulitis of face: Secondary | ICD-10-CM

## 2021-09-25 DIAGNOSIS — K029 Dental caries, unspecified: Secondary | ICD-10-CM | POA: Diagnosis present

## 2021-09-25 DIAGNOSIS — J45909 Unspecified asthma, uncomplicated: Secondary | ICD-10-CM | POA: Diagnosis present

## 2021-09-25 DIAGNOSIS — Z809 Family history of malignant neoplasm, unspecified: Secondary | ICD-10-CM

## 2021-09-25 DIAGNOSIS — A419 Sepsis, unspecified organism: Secondary | ICD-10-CM

## 2021-09-25 DIAGNOSIS — I10 Essential (primary) hypertension: Secondary | ICD-10-CM | POA: Diagnosis present

## 2021-09-25 DIAGNOSIS — Z8249 Family history of ischemic heart disease and other diseases of the circulatory system: Secondary | ICD-10-CM | POA: Diagnosis not present

## 2021-09-25 DIAGNOSIS — K130 Diseases of lips: Principal | ICD-10-CM | POA: Diagnosis present

## 2021-09-25 DIAGNOSIS — Z5982 Transportation insecurity: Secondary | ICD-10-CM

## 2021-09-25 DIAGNOSIS — E66813 Obesity, class 3: Secondary | ICD-10-CM | POA: Diagnosis present

## 2021-09-25 DIAGNOSIS — Z6841 Body Mass Index (BMI) 40.0 and over, adult: Secondary | ICD-10-CM

## 2021-09-25 DIAGNOSIS — F17210 Nicotine dependence, cigarettes, uncomplicated: Secondary | ICD-10-CM | POA: Diagnosis present

## 2021-09-25 DIAGNOSIS — I160 Hypertensive urgency: Secondary | ICD-10-CM | POA: Diagnosis present

## 2021-09-25 DIAGNOSIS — E785 Hyperlipidemia, unspecified: Secondary | ICD-10-CM | POA: Diagnosis present

## 2021-09-25 DIAGNOSIS — Z794 Long term (current) use of insulin: Secondary | ICD-10-CM

## 2021-09-25 DIAGNOSIS — I161 Hypertensive emergency: Secondary | ICD-10-CM | POA: Diagnosis present

## 2021-09-25 DIAGNOSIS — R220 Localized swelling, mass and lump, head: Secondary | ICD-10-CM | POA: Diagnosis present

## 2021-09-25 DIAGNOSIS — F1910 Other psychoactive substance abuse, uncomplicated: Secondary | ICD-10-CM | POA: Diagnosis present

## 2021-09-25 DIAGNOSIS — D72828 Other elevated white blood cell count: Secondary | ICD-10-CM | POA: Diagnosis present

## 2021-09-25 DIAGNOSIS — G40909 Epilepsy, unspecified, not intractable, without status epilepticus: Secondary | ICD-10-CM | POA: Diagnosis present

## 2021-09-25 DIAGNOSIS — E1165 Type 2 diabetes mellitus with hyperglycemia: Secondary | ICD-10-CM | POA: Diagnosis present

## 2021-09-25 DIAGNOSIS — Z91148 Patient's other noncompliance with medication regimen for other reason: Secondary | ICD-10-CM | POA: Diagnosis not present

## 2021-09-25 DIAGNOSIS — G8929 Other chronic pain: Secondary | ICD-10-CM | POA: Diagnosis present

## 2021-09-25 DIAGNOSIS — Z20822 Contact with and (suspected) exposure to covid-19: Secondary | ICD-10-CM | POA: Diagnosis present

## 2021-09-25 DIAGNOSIS — Z28310 Unvaccinated for covid-19: Secondary | ICD-10-CM | POA: Diagnosis not present

## 2021-09-25 LAB — GLUCOSE, CAPILLARY: Glucose-Capillary: 599 mg/dL (ref 70–99)

## 2021-09-25 LAB — CBC WITH DIFFERENTIAL/PLATELET
Abs Immature Granulocytes: 0.08 10*3/uL — ABNORMAL HIGH (ref 0.00–0.07)
Basophils Absolute: 0.1 10*3/uL (ref 0.0–0.1)
Basophils Relative: 0 %
Eosinophils Absolute: 0.1 10*3/uL (ref 0.0–0.5)
Eosinophils Relative: 0 %
HCT: 43.6 % (ref 36.0–46.0)
Hemoglobin: 14 g/dL (ref 12.0–15.0)
Immature Granulocytes: 1 %
Lymphocytes Relative: 14 %
Lymphs Abs: 2.3 10*3/uL (ref 0.7–4.0)
MCH: 27.2 pg (ref 26.0–34.0)
MCHC: 32.1 g/dL (ref 30.0–36.0)
MCV: 84.7 fL (ref 80.0–100.0)
Monocytes Absolute: 0.9 10*3/uL (ref 0.1–1.0)
Monocytes Relative: 6 %
Neutro Abs: 12.9 10*3/uL — ABNORMAL HIGH (ref 1.7–7.7)
Neutrophils Relative %: 79 %
Platelets: 172 10*3/uL (ref 150–400)
RBC: 5.15 MIL/uL — ABNORMAL HIGH (ref 3.87–5.11)
RDW: 12.8 % (ref 11.5–15.5)
WBC: 16.3 10*3/uL — ABNORMAL HIGH (ref 4.0–10.5)
nRBC: 0 % (ref 0.0–0.2)

## 2021-09-25 LAB — LACTIC ACID, PLASMA
Lactic Acid, Venous: 1.6 mmol/L (ref 0.5–1.9)
Lactic Acid, Venous: 1.2 mmol/L (ref 0.5–1.9)

## 2021-09-25 LAB — COMPREHENSIVE METABOLIC PANEL
ALT: 11 U/L (ref 0–44)
AST: 17 U/L (ref 15–41)
Albumin: 3 g/dL — ABNORMAL LOW (ref 3.5–5.0)
Alkaline Phosphatase: 65 U/L (ref 38–126)
Anion gap: 8 (ref 5–15)
BUN: 10 mg/dL (ref 6–20)
CO2: 27 mmol/L (ref 22–32)
Calcium: 7.8 mg/dL — ABNORMAL LOW (ref 8.9–10.3)
Chloride: 97 mmol/L — ABNORMAL LOW (ref 98–111)
Creatinine, Ser: 0.67 mg/dL (ref 0.44–1.00)
GFR, Estimated: >60 mL/min (ref >60)
Glucose, Bld: 378 mg/dL — ABNORMAL HIGH (ref 70–99)
Potassium: 4.2 mmol/L (ref 3.5–5.1)
Sodium: 132 mmol/L — ABNORMAL LOW (ref 135–145)
Total Bilirubin: 1.4 mg/dL — ABNORMAL HIGH (ref 0.3–1.2)
Total Protein: 6.6 g/dL (ref 6.5–8.1)

## 2021-09-25 LAB — TSH: TSH: 0.726 u[IU]/mL (ref 0.350–4.500)

## 2021-09-25 LAB — SARS CORONAVIRUS 2 BY RT PCR: SARS Coronavirus 2 by RT PCR: NEGATIVE

## 2021-09-25 LAB — BRAIN NATRIURETIC PEPTIDE: B Natriuretic Peptide: 60.6 pg/mL (ref 0.0–100.0)

## 2021-09-25 LAB — CREATININE, SERUM
Creatinine, Ser: 0.86 mg/dL (ref 0.44–1.00)
GFR, Estimated: >60 mL/min (ref >60)

## 2021-09-25 MED ORDER — MONTELUKAST SODIUM 10 MG PO TABS
10.0000 mg | ORAL_TABLET | Freq: Every day | ORAL | Status: DC
  Administered 2021-09-25 – 2021-09-29 (×5): 10 mg via ORAL
  Filled 2021-09-25 (×6): qty 1

## 2021-09-25 MED ORDER — SODIUM CHLORIDE 0.9 % IV SOLN
3.0000 g | Freq: Once | INTRAVENOUS | Status: AC
  Administered 2021-09-25: 3 g via INTRAVENOUS
  Filled 2021-09-25: qty 8

## 2021-09-25 MED ORDER — DIPHENHYDRAMINE HCL 50 MG/ML IJ SOLN
12.5000 mg | Freq: Two times a day (BID) | INTRAMUSCULAR | Status: AC
  Administered 2021-09-25 – 2021-09-27 (×4): 12.5 mg via INTRAVENOUS
  Filled 2021-09-25 (×4): qty 1

## 2021-09-25 MED ORDER — FAMOTIDINE IN NACL 20-0.9 MG/50ML-% IV SOLN
20.0000 mg | Freq: Once | INTRAVENOUS | Status: AC
  Administered 2021-09-25: 20 mg via INTRAVENOUS
  Filled 2021-09-25: qty 50

## 2021-09-25 MED ORDER — ORAL CARE MOUTH RINSE: 15.0000 mL | OROMUCOSAL | Status: DC | PRN

## 2021-09-25 MED ORDER — ALBUTEROL SULFATE (2.5 MG/3ML) 0.083% IN NEBU: 2.5000 mg | INHALATION_SOLUTION | RESPIRATORY_TRACT | Status: DC | PRN

## 2021-09-25 MED ORDER — METHYLPREDNISOLONE SODIUM SUCC 125 MG IJ SOLR
125.0000 mg | Freq: Once | INTRAMUSCULAR | Status: AC
Start: 2021-09-25 — End: 2021-09-25
  Administered 2021-09-25: 125 mg via INTRAVENOUS
  Filled 2021-09-25: qty 2

## 2021-09-25 MED ORDER — HYDRALAZINE HCL 10 MG PO TABS: 10.0000 mg | ORAL_TABLET | Freq: Four times a day (QID) | ORAL | Status: DC | PRN

## 2021-09-25 MED ORDER — FAMOTIDINE IN NACL 20-0.9 MG/50ML-% IV SOLN
20.0000 mg | Freq: Two times a day (BID) | INTRAVENOUS | Status: AC
  Administered 2021-09-26 – 2021-09-27 (×4): 20 mg via INTRAVENOUS
  Filled 2021-09-25 (×4): qty 50

## 2021-09-25 MED ORDER — FENTANYL CITRATE PF 50 MCG/ML IJ SOSY
12.5000 ug | PREFILLED_SYRINGE | INTRAMUSCULAR | Status: DC | PRN
  Administered 2021-09-26 (×4): 50 ug via INTRAVENOUS
  Filled 2021-09-25 (×4): qty 1

## 2021-09-25 MED ORDER — INSULIN GLARGINE-YFGN 100 UNIT/ML ~~LOC~~ SOLN
8.0000 [IU] | Freq: Every day | SUBCUTANEOUS | Status: DC
Start: 2021-09-25 — End: 2021-09-26
  Administered 2021-09-25: 8 [IU] via SUBCUTANEOUS
  Filled 2021-09-25: qty 0.08

## 2021-09-25 MED ORDER — ACETAMINOPHEN 500 MG PO TABS
1000.0000 mg | ORAL_TABLET | Freq: Once | ORAL | Status: AC
  Administered 2021-09-25: 1000 mg via ORAL
  Filled 2021-09-25: qty 2

## 2021-09-25 MED ORDER — EPINEPHRINE 0.3 MG/0.3ML IJ SOAJ: 0.3000 mg | Freq: Once | INTRAMUSCULAR | Status: DC | PRN

## 2021-09-25 MED ORDER — PREDNISONE 20 MG PO TABS
40.0000 mg | ORAL_TABLET | Freq: Every day | ORAL | Status: DC
  Administered 2021-09-26 – 2021-09-27 (×2): 40 mg via ORAL
  Filled 2021-09-25 (×2): qty 2

## 2021-09-25 MED ORDER — ENOXAPARIN SODIUM 60 MG/0.6ML IJ SOSY
0.5000 mg/kg | PREFILLED_SYRINGE | INTRAMUSCULAR | Status: DC
  Administered 2021-09-25 – 2021-09-29 (×5): 50 mg via SUBCUTANEOUS
  Filled 2021-09-25 (×5): qty 0.6

## 2021-09-25 MED ORDER — SODIUM CHLORIDE 0.9 % IV SOLN: INTRAVENOUS | Status: AC

## 2021-09-25 MED ORDER — FENTANYL CITRATE PF 50 MCG/ML IJ SOSY
50.0000 ug | PREFILLED_SYRINGE | INTRAMUSCULAR | Status: DC | PRN
  Administered 2021-09-25: 50 ug via INTRAVENOUS
  Filled 2021-09-25: qty 1

## 2021-09-25 MED ORDER — INSULIN ASPART 100 UNIT/ML IJ SOLN: 0.0000 [IU] | Freq: Three times a day (TID) | INTRAMUSCULAR | Status: DC

## 2021-09-25 MED ORDER — CEFAZOLIN SODIUM-DEXTROSE 1-4 GM/50ML-% IV SOLN
1.0000 g | Freq: Three times a day (TID) | INTRAVENOUS | Status: DC
  Administered 2021-09-25 – 2021-09-26 (×3): 1 g via INTRAVENOUS
  Filled 2021-09-25 (×3): qty 50

## 2021-09-25 MED ORDER — DIPHENHYDRAMINE HCL 50 MG/ML IJ SOLN
12.5000 mg | Freq: Once | INTRAMUSCULAR | Status: AC
  Administered 2021-09-25: 12.5 mg via INTRAVENOUS
  Filled 2021-09-25: qty 1

## 2021-09-25 MED ORDER — IOHEXOL 300 MG/ML  SOLN
75.0000 mL | Freq: Once | INTRAMUSCULAR | Status: AC | PRN
  Administered 2021-09-25: 75 mL via INTRAVENOUS

## 2021-09-25 NOTE — ED Provider Triage Note (Signed)
Emergency Medicine Provider Triage Evaluation Note  Caitlyn Fuller , a 46 y.o. female  was evaluated in triage.  Pt complains of facial swelling since last night. Also complains of SOB, nausea without vomiting, dizziness.  No Covid vaccine.  Not taking meds for hypertension or diabetes.    Review of Systems  Positive: Short of breath, facial swelling. + nasal drainage Negative: No chest pain.  No exposure to known allergens.   Physical Exam  Ht 4\' 10"  (1.473 m)   Wt 97.5 kg   BMI 44.94 kg/m  Gen:   Awake, no distress  able to talk without difficulty.  Resp:  Normal effort   Lungs clear.  MSK:   Moves extremities without difficulty  Other:  Facial edema present without rash.  Medical Decision Making  Medically screening exam initiated at 1:45 PM.  Appropriate orders placed.  Kaydra N Dolliver was informed that the remainder of the evaluation will be completed by another provider, this initial triage assessment does not replace that evaluation, and the importance of remaining in the ED until their evaluation is complete.     Johnn Hai, PA-C 09/25/21 1352

## 2021-09-25 NOTE — ED Provider Notes (Signed)
3:50 PM Assumed care for off going team.   Blood pressure (!) 169/100, pulse (!) 104, temperature 98.5 F (36.9 C), temperature source Oral, resp. rate 14, height 4\' 10"  (1.473 m), weight 97.5 kg, SpO2 98 %.  See their HPI for full report but in brief pending CT   White count is elevated at 16.  Given elevated white count and heart rate patient meets sepsis criteria therefore blood cultures and lactate will be ordered.  Going to start on Unasyn for possible tooth abscess versus facial cellulitis.  Her sugars are elevated on BMP but no evidence of DKA.  COVID test was negative.  I reevaluated patient no worsening swelling.  CT imaging was personally reviewed and interpreted and no evidence of any abscess.  IMPRESSION: Negative for abscess or osteomyelitis. Soft tissue swelling upper lip   Dental caries without periapical abscess   Mild mucosal edema paranasal sinuses   Given no obvious finding on CT imaging will treat as if this could be an allergic reaction as well.  Will discuss hospital team for admission to monitor the swelling to make sure that is not worsening.  Patient denies taking an ACE inhibitor over the past month.  .Critical Care  Performed by: Vanessa [place], MD Authorized by: Vanessa [place], MD   Critical care provider statement:    Critical care time (minutes):  30   Critical care was necessary to treat or prevent imminent or life-threatening deterioration of the following conditions:  Sepsis   Critical care was time spent personally by me on the following activities:  Development of treatment plan with patient or surrogate, discussions with consultants, evaluation of patient's response to treatment, examination of patient, ordering and review of laboratory studies, ordering and review of radiographic studies, ordering and performing treatments and interventions, pulse oximetry, re-evaluation of patient's condition and review of old charts     Vanessa [place], MD 09/25/21  [phone]

## 2021-09-25 NOTE — ED Provider Notes (Signed)
[facility] Provider Note    None    (approximate)   History   Facial Swelling   HPI  Caitlyn Fuller is a 46 y.o. female presents to the ER for evaluation of facial swelling.  Having some pain in her face as well and noticed some swelling of her hands and feet.  She denies any chest pain or shortness of breath.  No trouble swallowing.  States that she woke up with symptoms like this.  States that she noted some swelling to her upper lip yesterday.  Was previously on ramipril but has not taken that medication in several weeks.     Physical Exam   Triage Vital Signs: ED Triage Vitals  Enc Vitals Group     BP 09/25/21 1347 (!) 198/147     Pulse Rate 09/25/21 1347 (!) 112     Resp 09/25/21 1347 (!) 22     Temp 09/25/21 1347 98.5 F (36.9 C)     Temp Source 09/25/21 1347 Oral     SpO2 09/25/21 1347 92 %     Weight 09/25/21 1345 215 lb (97.5 kg)     Height 09/25/21 1345 4\' 10"  (1.473 m)     Head Circumference --      Peak Flow --      Pain Score 09/25/21 1344 10     Pain Loc --      Pain Edu? --      Excl. in GC? --     Most recent vital signs: Vitals:   09/25/21 1347  BP: (!) 198/147  Pulse: (!) 112  Resp: (!) 22  Temp: 98.5 F (36.9 C)  SpO2: 92%     Constitutional: Alert  Eyes: Conjunctivae are normal.  Head: Atraumatic.  Does have right greater than left facial swelling with angioedema of the upper lip.  Normal tongue no uvular edema normal phonation no stridor. Nose: No congestion/rhinnorhea. Mouth/Throat: Mucous membranes are moist.   Neck: Painless ROM.  Cardiovascular:   Good peripheral circulation. Respiratory: Normal respiratory effort.  No retractions.  Gastrointestinal: Soft and nontender.  Musculoskeletal:  no deformity Neurologic:  MAE spontaneously. No gross focal neurologic deficits are appreciated.  Skin:  Skin is warm, dry and intact. No rash noted. Psychiatric: Mood and affect are normal. Speech and behavior  are normal.    ED Results / Procedures / Treatments   Labs (all labs ordered are listed, but only abnormal results are displayed) Labs Reviewed  SARS CORONAVIRUS 2 BY RT PCR  CBC WITH DIFFERENTIAL/PLATELET  URINALYSIS, ROUTINE W REFLEX MICROSCOPIC  COMPREHENSIVE METABOLIC PANEL  BRAIN NATRIURETIC PEPTIDE     EKG  ED ECG REPORT I, 09/27/21, the attending physician, personally viewed and interpreted this ECG.   Date: 09/25/2021  EKG Time: 13:54  Rate: 100  Rhythm: sinus  Axis: left  Intervals:normal qt  ST&T Change: no stemi, no depressions    RADIOLOGY Please see ED Course for my review and interpretation.  I personally reviewed all radiographic images ordered to evaluate for the above acute complaints and reviewed radiology reports and findings.  These findings were personally discussed with the patient.  Please see medical record for radiology report.    PROCEDURES: Procedures   MEDICATIONS ORDERED IN ED: Medications - No data to display   IMPRESSION / MDM / ASSESSMENT AND PLAN / ED COURSE  I reviewed the triage vital signs and the nursing notes.  Differential diagnosis includes, but is not limited to, angioedema, cellulitis, abscess, allergic reaction, anaphylaxis  Scented to the ER for evaluation of symptoms as described above.  This presenting complaint could reflect a potentially life-threatening illness therefore the patient will be placed on continuous pulse oximetry and telemetry for monitoring.  Laboratory evaluation will be sent to evaluate for the above complaints.  Unclear etiology.  Possible angioedema though no inciting factors.  Patient significantly hypertensive may be CHF or cardiac etiology.  Does have pain with palpation on exam   Clinical Course as of 09/25/21 1501  Sun Sep 25, 2021  1431 Chest x-ray my review and interpretation does not show any evidence of consolidation. [PR]    Clinical Course User  Index [PR] Merlyn Lot, MD   Patient remains nontoxic-appearing not hypoxic protecting her airway.  Still awaiting blood work as well as imaging.  Patient will be signed out to oncoming physician pending follow-up of blood work imaging and reassess.  FINAL CLINICAL IMPRESSION(S) / ED DIAGNOSES   Final diagnoses:  Facial swelling     Rx / DC Orders   ED Discharge Orders     None        Note:  This document was prepared using Dragon voice recognition software and may include unintentional dictation errors.    Merlyn Lot, MD 09/25/21 1501

## 2021-09-25 NOTE — ED Triage Notes (Signed)
Via POV with obvious right-sided and central facial swelling since last night. Pt states she feels SOB, nauseated, dizzy. Unsure if she has contacted any allergen. HTN noted in triage.

## 2021-09-25 NOTE — H&P (Addendum)
History and Physical    Caitlyn Fuller TOI:[id number] DOB: 04-12-1975 DOA: 09/25/2021  PCP: [facility]  Patient coming from: Home  Chief Complaint: Lip swelling  HPI: Caitlyn Fuller is a 46 y.o. female with medical history significant of asthma, DM2, HTN, h/o shingles who presents for lip swelling.  She notes that she awoke with lip and face swelling which is painful to the touch.  She is not sure of why this might have happened.  She has had swelling and reactions to medications in the past, but this is the worst it has been before.  She notes possible insect bites in her scalp as the cause.  She has been out of her medications for about 3 weeks, and was previously on an ACE-I, though she reports that she was recently taken off of that.  She has her new pill bottle, but has not taken any yet.  She also recently starting taking buprenorphine-naloxone about 3 weeks ago.  She feels strongly that this is not the cause.  She denies SOB, difficulty swallowing, wheezing, difficulty taking a deep breath, pain with movement of the eyes or with speaking.   She reports swelling of the hands and feet as well.   ED Course: In the ED, Na was 132, Glu 378, Ca 7.8, Alb 3.0, Tbili mildly elevated at 1.4, WBC of 16, H/H within normal limits.  COVID negative.  CT max/face showed no abscess, did show soft tissue lip swelling.  Currently has not received treatment in the ED as CT max face was being done.  Ordered for Unasyn, methylprednisolone, famotidine, benadryl.   Review of Systems: As per HPI otherwise all other systems reviewed and are negative.   Past Medical History:  Diagnosis Date   Asthma    Diabetes mellitus without complication (HCC)    Hernia    Hypertension    Kidney infection    Shingles   Last asthma issues was years ago  Past Surgical History:  Procedure Laterality Date   ABDOMINAL SURGERY     CESAREAN SECTION     CHOLECYSTECTOMY     colonscopy     TUBAL  LIGATION      Social History  reports that she has been smoking cigarettes. She has been smoking an average of .5 packs per day. She has never used smokeless tobacco. She reports that she does not drink alcohol and does not use drugs.  Allergies  Allergen Reactions   Amlodipine Swelling   Gabapentin Anaphylaxis   Morphine And Related Anaphylaxis   Darvocet [Propoxyphene N-Acetaminophen] Hives   Toradol [Ketorolac Tromethamine] Other (See Comments)    migraines   Ultram [Tramadol] Hives   Vancomycin Itching    Family History  Problem Relation Age of Onset   Cancer Mother    Heart failure Father     Medications:  Out of medications X 3 weeks Not taking keppra or ramipril On Suboxone - new started 3 weeks ago   Physical Exam: Vitals:   09/25/21 1345 09/25/21 1347 09/25/21 1516  BP:  (!) 198/147 (!) 169/100  Pulse:  (!) 112 (!) 104  Resp:  (!) 22 14  Temp:  98.5 F (36.9 C)   TempSrc:  Oral   SpO2:  92% 98%  Weight: 97.5 kg    Height: 4\' 10"  (1.473 m)      Constitutional: Sitting up in bed, appears uncomfortable Eyes: She has swelling of the right lower eyelid.  Her EOM are intact and  non painful ENMT: Mucous membranes are moist. She has swelling of the lip, right cheek and below right jaw.  She is able to move tongue.  Dentition is not overly poor, however, dental caries are seen.   Neck: normal, supple, no stridor Respiratory: clear to auscultation bilaterally, no wheezing, no rales or crackles.  Cardiovascular: Tachycardic, regular, no murmur noted Abdomen: +BS, NT Musculoskeletal: no clubbing / cyanosis. She has some mild non pitting swelling of the hands, and some mild pitting to the ankles bilaterally.  Normal bulk.  Skin: no rashes, lesions, ulcers Neurologic: No focal deficit.  Speech is clear Psychiatric: Normal judgment and insight. Alert and oriented x 3. Normal mood.    Labs on Admission: I have personally reviewed following labs and imaging  studies  CBC: Recent Labs  Lab 09/25/21 1502  WBC 16.3*  NEUTROABS 12.9*  HGB 14.0  HCT 43.6  MCV 84.7  PLT 474    Basic Metabolic Panel: Recent Labs  Lab 09/25/21 1533  NA 132*  K 4.2  CL 97*  CO2 27  GLUCOSE 378*  BUN 10  CREATININE 0.67  CALCIUM 7.8*    GFR: Estimated Creatinine Clearance: 88.1 mL/min (by C-G formula based on SCr of 0.67 mg/dL).  Liver Function Tests: Recent Labs  Lab 09/25/21 1533  AST 17  ALT 11  ALKPHOS 65  BILITOT 1.4*  PROT 6.6  ALBUMIN 3.0*       Radiological Exams on Admission: CT Maxillofacial W Contrast  Result Date: 09/25/2021 CLINICAL DATA:  Facial swelling rule out abscess or osteomyelitis EXAM: CT MAXILLOFACIAL WITH CONTRAST TECHNIQUE: Multidetector CT imaging of the maxillofacial structures was performed with intravenous contrast. Multiplanar CT image reconstructions were also generated. RADIATION DOSE REDUCTION: This exam was performed according to the departmental dose-optimization program which includes automated exposure control, adjustment of the mA and/or kV according to patient size and/or use of iterative reconstruction technique. CONTRAST:  25mL OMNIPAQUE IOHEXOL 300 MG/ML  SOLN COMPARISON:  None Available. FINDINGS: Osseous: Negative for fracture or osteomyelitis. Dental caries in the molars bilaterally. No periapical abscess. Orbits: Negative for mass or edema. Sinuses: Mild mucosal edema paranasal sinuses. No air-fluid level. Mastoid and middle ear clear bilaterally Soft tissues: Soft tissue swelling upper lip. No mass or adenopathy identified. Limited intracranial: Negative IMPRESSION: Negative for abscess or osteomyelitis. Soft tissue swelling upper lip Dental caries without periapical abscess Mild mucosal edema paranasal sinuses Electronically Signed   By: [name] M.D.   On: 09/25/2021 17:17   DG Chest 2 View  Result Date: 09/25/2021 CLINICAL DATA:  Cough, fever and chills. EXAM: CHEST - 2 VIEW COMPARISON:   05/02/2021. FINDINGS: Cardiac silhouette is normal in size. No mediastinal or hilar masses or evidence of adenopathy. Clear lungs.  No pleural effusion or pneumothorax. Skeletal structures are intact. IMPRESSION: No active cardiopulmonary disease. Electronically Signed   By: Lajean Manes M.D.   On: 09/25/2021 14:41    EKG: Independently reviewed. Wandering baseline, LAE.    Assessment/Plan Facial cellulitis, allergic reaction - primary diagnosis not clear currently.  She has an elevated WBC, elevated BP and tachycardic.  She has a course that seems more consistent with allergic reaction - Will plan to temporize and treat for both tonight, monitor overnight - epinephrine PRN - Famotidine, solumedrol, benadryl due in the ED - Unasyn X 1 --> Cefazolin - Imaging does not show abscess or ludwig's angina - Continuous pulse ox - Pain control with fentanyl IV - hold buprenorphine - After initial  treatment in the ED, I have ordered benadryl, famotidine and prednisone to start tomorrow - Albuterol nebulizer PRN - Singulair qhs  Chronic pain - Fentanyl PRN for pain - Consider restarting buprenorphine at discharge    Essential hypertension - Hydralazine PRN for SBP > 160 - Avoid amlodipine and ACE-I - Patient's friend will bring in new medication today, adjust as needed   DVT prophylaxis: Lovenox  Code Status:   Full  Family Communication:  Friend at bedside  Disposition Plan:   Patient is from:  Home  Anticipated DC to:  Home  Anticipated DC date:  09/28/21  Anticipated DC barriers: Pending improvement  Consults called:  None, ICU for any respiratory issues  Admission status:  IP, med Surg   Severity of Illness: The appropriate patient status for this patient is INPATIENT. Inpatient status is judged to be reasonable and necessary in order to provide the required intensity of service to ensure the patient's safety. The patient's presenting symptoms, physical exam findings, and initial  radiographic and laboratory data in the context of their chronic comorbidities is felt to place them at high risk for further clinical deterioration. Furthermore, it is not anticipated that the patient will be medically stable for discharge from the hospital within 2 midnights of admission.   * I certify that at the point of admission it is my clinical judgment that the patient will require inpatient hospital care spanning beyond 2 midnights from the point of admission due to high intensity of service, high risk for further deterioration and high frequency of surveillance required.Debe Coder MD [facility]  How to contact the [facility] Attending or Consulting provider 7A - 7P or covering provider during after hours 7P -7A, for this patient?   Check the care team in [facility] and look for a) attending/consulting [facility] provider listed and b) the [facility] team listed Log into [email or web address] and use [place]'s universal password to access. If you do not have the password, please contact the hospital operator. Locate the [facility] provider you are looking for under [facility] and page to a number that you can be directly reached. If you still have difficulty reaching the provider, please page the [facility] (Director on Call) for the Hospitalists listed on amion for assistance.  09/25/2021, 6:20 PM

## 2021-09-26 DIAGNOSIS — G40909 Epilepsy, unspecified, not intractable, without status epilepticus: Secondary | ICD-10-CM

## 2021-09-26 DIAGNOSIS — E1165 Type 2 diabetes mellitus with hyperglycemia: Secondary | ICD-10-CM

## 2021-09-26 DIAGNOSIS — F1910 Other psychoactive substance abuse, uncomplicated: Secondary | ICD-10-CM

## 2021-09-26 DIAGNOSIS — E785 Hyperlipidemia, unspecified: Secondary | ICD-10-CM | POA: Diagnosis present

## 2021-09-26 DIAGNOSIS — R220 Localized swelling, mass and lump, head: Secondary | ICD-10-CM

## 2021-09-26 DIAGNOSIS — Z794 Long term (current) use of insulin: Secondary | ICD-10-CM

## 2021-09-26 DIAGNOSIS — E66813 Obesity, class 3: Secondary | ICD-10-CM | POA: Diagnosis present

## 2021-09-26 DIAGNOSIS — I160 Hypertensive urgency: Secondary | ICD-10-CM

## 2021-09-26 LAB — BASIC METABOLIC PANEL
Anion gap: 12 (ref 5–15)
BUN: 18 mg/dL (ref 6–20)
CO2: 26 mmol/L (ref 22–32)
Calcium: 8.5 mg/dL — ABNORMAL LOW (ref 8.9–10.3)
Chloride: 92 mmol/L — ABNORMAL LOW (ref 98–111)
Creatinine, Ser: 0.96 mg/dL (ref 0.44–1.00)
GFR, Estimated: >60 mL/min (ref >60)
Glucose, Bld: 593 mg/dL (ref 70–99)
Potassium: 4.2 mmol/L (ref 3.5–5.1)
Sodium: 130 mmol/L — ABNORMAL LOW (ref 135–145)

## 2021-09-26 LAB — CBC
HCT: 43.8 % (ref 36.0–46.0)
Hemoglobin: 14 g/dL (ref 12.0–15.0)
MCH: 27.1 pg (ref 26.0–34.0)
MCHC: 32 g/dL (ref 30.0–36.0)
MCV: 84.7 fL (ref 80.0–100.0)
Platelets: 161 10*3/uL (ref 150–400)
RBC: 5.17 MIL/uL — ABNORMAL HIGH (ref 3.87–5.11)
RDW: 12.7 % (ref 11.5–15.5)
WBC: 16 10*3/uL — ABNORMAL HIGH (ref 4.0–10.5)
nRBC: 0 % (ref 0.0–0.2)

## 2021-09-26 LAB — HEMOGLOBIN A1C
Hgb A1c MFr Bld: 11.8 % — ABNORMAL HIGH (ref 4.8–5.6)
Mean Plasma Glucose: 291.96 mg/dL

## 2021-09-26 LAB — GLUCOSE, CAPILLARY
Glucose-Capillary: 515 mg/dL (ref 70–99)
Glucose-Capillary: >600 mg/dL (ref 70–99)
Glucose-Capillary: 589 mg/dL (ref 70–99)
Glucose-Capillary: 544 mg/dL (ref 70–99)
Glucose-Capillary: 545 mg/dL (ref 70–99)
Glucose-Capillary: 569 mg/dL (ref 70–99)

## 2021-09-26 LAB — PROCALCITONIN: Procalcitonin: <0.1 ng/mL

## 2021-09-26 LAB — GLUCOSE, RANDOM: Glucose, Bld: 561 mg/dL (ref 70–99)

## 2021-09-26 MED ORDER — LIDOCAINE 5 % EX PTCH
3.0000 | MEDICATED_PATCH | CUTANEOUS | Status: DC
  Administered 2021-09-26 – 2021-09-29 (×4): 3 via TRANSDERMAL
  Filled 2021-09-26 (×4): qty 3
  Filled 2021-09-26: qty 2

## 2021-09-26 MED ORDER — HYDRALAZINE HCL 50 MG PO TABS
100.0000 mg | ORAL_TABLET | Freq: Three times a day (TID) | ORAL | Status: DC
  Administered 2021-09-26 – 2021-09-30 (×12): 100 mg via ORAL
  Filled 2021-09-26 (×14): qty 2

## 2021-09-26 MED ORDER — INSULIN ASPART 100 UNIT/ML IJ SOLN: 0.0000 [IU] | Freq: Every day | INTRAMUSCULAR | Status: DC

## 2021-09-26 MED ORDER — FUROSEMIDE 10 MG/ML IJ SOLN
20.0000 mg | Freq: Two times a day (BID) | INTRAMUSCULAR | Status: DC
  Administered 2021-09-26 – 2021-09-30 (×9): 20 mg via INTRAVENOUS
  Filled 2021-09-26 (×9): qty 2

## 2021-09-26 MED ORDER — ISOSORBIDE MONONITRATE ER 30 MG PO TB24
30.0000 mg | ORAL_TABLET | Freq: Every day | ORAL | Status: DC
  Administered 2021-09-26 – 2021-09-27 (×2): 30 mg via ORAL
  Filled 2021-09-26 (×2): qty 1

## 2021-09-26 MED ORDER — METOPROLOL TARTRATE 5 MG/5ML IV SOLN: 5.0000 mg | Freq: Four times a day (QID) | INTRAVENOUS | Status: DC | PRN

## 2021-09-26 MED ORDER — HYDRALAZINE HCL 20 MG/ML IJ SOLN
20.0000 mg | Freq: Four times a day (QID) | INTRAMUSCULAR | Status: DC | PRN
  Administered 2021-09-26 – 2021-09-27 (×3): 20 mg via INTRAVENOUS
  Filled 2021-09-26 (×3): qty 1

## 2021-09-26 MED ORDER — METOPROLOL TARTRATE 25 MG PO TABS
25.0000 mg | ORAL_TABLET | Freq: Two times a day (BID) | ORAL | Status: DC
  Administered 2021-09-26 – 2021-09-30 (×8): 25 mg via ORAL
  Filled 2021-09-26 (×10): qty 1

## 2021-09-26 MED ORDER — CEFAZOLIN SODIUM-DEXTROSE 2-4 GM/100ML-% IV SOLN
2.0000 g | Freq: Three times a day (TID) | INTRAVENOUS | Status: DC
  Administered 2021-09-26 – 2021-09-27 (×3): 2 g via INTRAVENOUS
  Filled 2021-09-26 (×4): qty 100

## 2021-09-26 MED ORDER — INSULIN ASPART 100 UNIT/ML IJ SOLN
45.0000 [IU] | Freq: Once | INTRAMUSCULAR | Status: AC
  Administered 2021-09-26: 45 [IU] via SUBCUTANEOUS
  Filled 2021-09-26: qty 1

## 2021-09-26 MED ORDER — BUPRENORPHINE HCL-NALOXONE HCL 8-2 MG SL SUBL
1.0000 | SUBLINGUAL_TABLET | Freq: Every day | SUBLINGUAL | Status: DC
  Administered 2021-09-26 – 2021-09-28 (×3): 1 via SUBLINGUAL
  Filled 2021-09-26 (×3): qty 1

## 2021-09-26 MED ORDER — INSULIN GLARGINE-YFGN 100 UNIT/ML ~~LOC~~ SOLN
25.0000 [IU] | Freq: Every day | SUBCUTANEOUS | Status: DC
  Administered 2021-09-27: 25 [IU] via SUBCUTANEOUS
  Filled 2021-09-26: qty 0.25

## 2021-09-26 MED ORDER — INSULIN ASPART 100 UNIT/ML IJ SOLN
0.0000 [IU] | Freq: Three times a day (TID) | INTRAMUSCULAR | Status: DC
  Administered 2021-09-26 – 2021-09-27 (×4): 20 [IU] via SUBCUTANEOUS
  Administered 2021-09-27 (×2): 15 [IU] via SUBCUTANEOUS
  Filled 2021-09-26 (×7): qty 1

## 2021-09-26 MED ORDER — INSULIN GLARGINE-YFGN 100 UNIT/ML ~~LOC~~ SOLN
15.0000 [IU] | Freq: Every day | SUBCUTANEOUS | Status: DC
  Filled 2021-09-26: qty 0.15

## 2021-09-26 MED ORDER — METOPROLOL TARTRATE 5 MG/5ML IV SOLN
5.0000 mg | Freq: Four times a day (QID) | INTRAVENOUS | Status: DC
  Administered 2021-09-26: 5 mg via INTRAVENOUS
  Filled 2021-09-26: qty 5

## 2021-09-26 MED ORDER — INSULIN ASPART 100 UNIT/ML IJ SOLN
6.0000 [IU] | Freq: Three times a day (TID) | INTRAMUSCULAR | Status: DC
  Administered 2021-09-26 – 2021-09-27 (×3): 6 [IU] via SUBCUTANEOUS
  Filled 2021-09-26 (×3): qty 1

## 2021-09-26 MED ORDER — ATORVASTATIN CALCIUM 10 MG PO TABS
10.0000 mg | ORAL_TABLET | Freq: Every day | ORAL | Status: DC
  Administered 2021-09-26 – 2021-09-30 (×5): 10 mg via ORAL
  Filled 2021-09-26 (×5): qty 1

## 2021-09-26 MED ORDER — INSULIN ASPART 100 UNIT/ML IJ SOLN: 0.0000 [IU] | Freq: Three times a day (TID) | INTRAMUSCULAR | Status: DC

## 2021-09-26 MED ORDER — INSULIN ASPART 100 UNIT/ML IJ SOLN
14.0000 [IU] | Freq: Once | INTRAMUSCULAR | Status: AC
  Administered 2021-09-26: 14 [IU] via SUBCUTANEOUS
  Filled 2021-09-26: qty 1

## 2021-09-26 MED ORDER — INSULIN GLARGINE-YFGN 100 UNIT/ML ~~LOC~~ SOLN
10.0000 [IU] | SUBCUTANEOUS | Status: AC
  Administered 2021-09-26: 10 [IU] via SUBCUTANEOUS

## 2021-09-26 MED ORDER — MELATONIN 5 MG PO TABS
5.0000 mg | ORAL_TABLET | Freq: Once | ORAL | Status: AC
  Administered 2021-09-26: 5 mg via ORAL
  Filled 2021-09-26: qty 1

## 2021-09-26 MED ORDER — ACETAMINOPHEN 325 MG PO TABS
650.0000 mg | ORAL_TABLET | Freq: Four times a day (QID) | ORAL | Status: DC | PRN
  Administered 2021-09-26 – 2021-09-30 (×7): 650 mg via ORAL
  Filled 2021-09-26 (×7): qty 2

## 2021-09-26 MED ORDER — INSULIN GLARGINE-YFGN 100 UNIT/ML ~~LOC~~ SOLN
15.0000 [IU] | Freq: Once | SUBCUTANEOUS | Status: AC
  Administered 2021-09-26: 15 [IU] via SUBCUTANEOUS
  Filled 2021-09-26: qty 0.15

## 2021-09-26 NOTE — Progress Notes (Signed)
Mobility Specialist - Progress Note   09/26/21 1558  Mobility  Activity Ambulated independently in hallway  Level of Assistance Independent  Assistive Device None  Distance Ambulated (ft) 160 ft  Activity Response Tolerated well  $Mobility charge 1 Mobility    Pt OOB on RA upon arrival. Pt ambulates 1 lap around NS indep. Pt returns to room with needs in reach.   Gretchen Short  Mobility Specialist  09/26/21 3:59 PM

## 2021-09-26 NOTE — Plan of Care (Signed)
  Problem: Clinical Measurements: Goal: Ability to avoid or minimize complications of infection will improve Outcome: Progressing   Problem: Skin Integrity: Goal: Skin integrity will improve Outcome: Progressing   Problem: Education: Goal: Ability to describe self-care measures that may prevent or decrease complications (Diabetes Survival Skills Education) will improve Outcome: Progressing

## 2021-09-26 NOTE — Progress Notes (Signed)
Date and time results received: 09/26/21 0859 (use smartphrase ".now" to insert current time)  Test: CBG  Critical Value: 515  Name of Provider Notified: Dr. Maryland Pink   Orders Received? Or Actions Taken?: Orders Received - See Orders for details

## 2021-09-26 NOTE — Assessment & Plan Note (Signed)
.    Meets criteria BMI greater than 40 

## 2021-09-26 NOTE — Progress Notes (Signed)
Inpatient Diabetes Program Recommendations  AACE/ADA: New Consensus Statement on Inpatient Glycemic Control (2015)  Target Ranges:  Prepandial:   less than 140 mg/dL      Peak postprandial:   less than 180 mg/dL (1-2 hours)      Critically ill patients:  140 - 180 mg/dL   Lab Results  Component Value Date   GLUCAP 515 (HH) 09/26/2021   HGBA1C 13.2 (H) 04/12/2021    Review of Glycemic Control  Latest Reference Range & Units 09/25/21 21:30 09/26/21 08:29  Glucose-Capillary 70 - 99 mg/dL 599 (HH) 515 (HH)   Diabetes history: DM 2 Outpatient Diabetes medications:  Lantus 15 units daily Current orders for Inpatient glycemic control:  Semglee 15 units daily, Novolog 0-20 units tid with meals and HS Prednisone 40 mg daily Inpatient Diabetes Program Recommendations:    Note patient received Novolog 20 units this AM for blood sugar>500 mg/dL.   Consider increasing Semglee to 25 units daily and also consider adding Novolog 6 units tid with meals (hold if patient eats less than 50% or NPO).   Thanks,  Adah Perl, RN, BC-ADM Inpatient Diabetes Coordinator Pager [phone]  (8a-5p)

## 2021-09-26 NOTE — Assessment & Plan Note (Signed)
Patient reportedly at 1 point had been on Keppra, but it is unclear how long she has been off this medication.  She remains seizure-free and for now, we will leave her off this medication.

## 2021-09-26 NOTE — Assessment & Plan Note (Addendum)
Maxillofacial CT notes no evidence of any cellulitis, only mild soft tissue swelling.  White blood cell count remains elevated procalcitonin level normal.  Continue Benadryl, H2 blocker and steroids.  Does not appear to be angioedema.  Patient previously had been on ACE inhibitor, but has not been taking her medications for at least the last 3 weeks.  Check C1 esterase and alpha galactosidase.  C1 esterase is 40, upper limit of normal is 39.  Alpha galactosidase is in process.  Patient received 1 dose of Unasyn in the emergency room and then was restarted on 9/19.  Completed 5 days of IV antibiotics by 9/22.  Patient felt to no longer need any further antibiotics.  Swelling and infection significantly improved.

## 2021-09-26 NOTE — Hospital Course (Addendum)
46 year old female with past medical history of polysubstance abuse on Suboxone, poorly controlled diabetes mellitus, seizure disorder, morbid obesity and hypertension who presented to the emergency room on 9/17 for lip swelling that started when she woke up that morning.  Lip was swollen and painful to touch.  Patient noted to have markedly high blood pressures in the 190s and CBGs in the 300s.  Patient states that she has been out of medicines now for several weeks.  Patient brought in for treatment of facial cellulitis versus allergic reaction as well as treatment of high blood pressure and blood sugars.  Sepsis ruled out.    In the emergency room, she was given steroids and started back on her Lantus insulin at a reduced dose.  By following morning 09/18, blood sugars in the 500s although patient not in DKA.  Patient's been restarted on IV Unasyn is started to have some decrease in her swelling.  Her sugars remain markedly elevated however this looks to be secondary to dietary indiscretion as patient has had desserts and regular soda brought in.  By 9/22, patient completed 5 days of IV antibiotics and infection and swelling much improved.  Blood pressure better controlled and CBGs have been trending downward.  Felt to be stable for discharge.  Patient was given prescriptions for new blood pressure medications, insulin and Lipitor.

## 2021-09-26 NOTE — Progress Notes (Addendum)
Dr. Maryland Pink notified of elevated CBG 545, per MD give 40 units of novolog now. Pt also upset because PRN Fentanyl was d/c , pt states Suboxone will not do anything and would like something else for pain. MD notified.

## 2021-09-26 NOTE — Progress Notes (Signed)
Date and time results received: 09/26/21 1140 (use smartphrase ".now" to insert current time)  Test: CBG Critical Value: CBG   Name of Provider Notified: Dr. Gerrie Nordmann  Orders Received? Or Actions Taken?: Orders Received - See Orders for details per MD give 30 units of novolog now and recheck in 1 hr. Will continue to monitor.

## 2021-09-26 NOTE — Assessment & Plan Note (Addendum)
Patient has been off of all blood pressure medications for the past 3 weeks.  She has not had any follow-up.  No end-organ damage.  It is possible some of this could be withdrawals.  In the meantime, I have started scheduled Lopressor, hydralazine, and Imdur and Lasix.  Given prescriptions with 11 refills on discharge.  Blood pressure much improved

## 2021-09-26 NOTE — Assessment & Plan Note (Addendum)
A1c earlier this year at 20 and this hospitalization at 11.8.  Patient has not been taking her insulin for at least a month.  CBGs are even more markedly elevated on admission given illness plus steroids for treating underlying allergic reaction.  Despite continued titration soft, sugars have remained high, this is in part due to patient having soda and desserts brought in.  Have spoken to nursing and we will confiscate these.  Discharged on Levemir 40 units subcu daily.  Sugars will have some slight improvement once steroids are completed in a few days as well.

## 2021-09-26 NOTE — Progress Notes (Signed)
[facility] Progress Note  Patient: Caitlyn Fuller    SHF:[id number]  DOA: 09/25/2021    Date of Service: the patient was seen and examined on 09/26/2021  Brief hospital course: 46 year old female with past medical history of polysubstance abuse on Suboxone, poorly controlled diabetes mellitus, seizure disorder, morbid obesity and hypertension who presented to the emergency room on 9/17 for lip swelling that started when she woke up that morning.  Lip was swollen and painful to touch.  Patient noted to have markedly high blood pressures in the 190s and CBGs in the 300s.  Patient states that she has been out of medicines now for several weeks.  Patient brought in for treatment of facial cellulitis versus allergic reaction as well as treatment of high blood pressure and blood sugars.  In the emergency room, she was given steroids and started back on her Lantus insulin at a reduced dose.  By following morning, blood sugars in the 500s although patient not in DKA.  Assessment and Plan: Assessment and Plan: * Facial swelling Received 1 dose of Unasyn in case this was an infection.  However, maxillofacial CT notes no evidence of any cellulitis, only mild soft tissue swelling.  White blood cell count is elevated, however with no shift.  Procalcitonin level normal.  No further antibiotics, likely this may be more of an allergic response.  Continue Benadryl, H2 blocker and steroids.  Does not appear to be angioedema.  Patient previously had been on ACE inhibitor, but has not been taking her medications for at least the last 3 weeks.  Check C1 esterase and alpha galactosidase  Hypertensive urgency Patient has been off of all blood pressure medications for the past 3 weeks.  She has not had any follow-up.  No end-organ damage.  It is possible some of this could be withdrawals.  In the meantime, I have started scheduled Lopressor, hydralazine, and Imdur.  Trying to avoid Altace given an allergic reaction  response   Uncontrolled type 2 diabetes mellitus with hyperglycemia, with long-term current use of insulin (HCC) A1c earlier this year at 66.  Patient is not taking her insulin for at least a month.  CBGs are even more markedly elevated on admission given illness plus steroids for treating underlying allergic reaction.  Patient received half dose, 8 units, of her previous dose of 24-hour acting insulin last night.  Seen by diabetes education and recommended to start 25 units this morning.  Did start to see an improvement in her CBGs by this evening.  In the meantime, aggressive sliding scale use.  Polysubstance abuse (HCC) Overlaps with her chronic pain.  Patient in the past has had positive urine drug screens for multiple illicit substances.  She is currently on Suboxone.  She mentioned about going to a pain clinic in [place] for oxycodone, but states she does not go there anymore for because of lack of transportation.  She states that she recently started on Suboxone a few weeks ago, but denies that this is for any type of addiction in case this is for her chronic back pain.  We will discuss with them to talk to her PCP.  Seizure disorder [facility]) Patient is supposed to be on Keppra twice a day.  It is unclear if she has been on this medication for longer than the past 3 weeks.  We will restart this medicine.  Hyperlipidemia Restarted on her Lipitor.  Checking fasting lipid panel.  Obesity, Class III, BMI 40-49.9 (morbid obesity) (HCC) Meets  criteria BMI greater than 40       Body mass index is 44.94 kg/m.        Consultants: None  Procedures: None  Antimicrobials: IV Unasyn x1 9/17  Code Status: Full code   Subjective: Patient complains of hurting all over, especially in her back  Objective: Blood pressures are elevated Vitals:   09/26/21 1103 09/26/21 1348  BP: (!) 193/97 (!) 163/96  Pulse: (!) 102 95  Resp: 18 18  Temp: 98 F (36.7 C)   SpO2: 98% 94%     Intake/Output Summary (Last 24 hours) at 09/26/2021 1609 Last data filed at 09/26/2021 1427 Gross per 24 hour  Intake 2621 ml  Output --  Net 2621 ml   Filed Weights   09/25/21 1345  Weight: 97.5 kg   Body mass index is 44.94 kg/m.  Exam:  General: Alert and oriented x3, no acute distress H EENT: Normocephalic, atraumatic, mucous membranes are slightly dry.  Patient has some induration and swelling above her upper lip.  There is no dental or gum swelling. Cardiovascular: Regular rate and rhythm, S1-S2 Respiratory: Clear to auscultation bilaterally Abdomen: Nontender, nondistended, positive bowel sounds Musculoskeletal: No clubbing or cyanosis or edema Skin: No skin breaks, tears or lesions Psychiatry: Appropriate, no evidence of psychoses Neurology: No focal deficits  Data Reviewed: Noted normal procalcitonin.  White blood cell count still elevated  Disposition:  Status is: Inpatient Remains inpatient appropriate because: Treatment of blood pressure and blood sugars, ensuring swelling is coming down    Anticipated discharge date: 9/19  Family Communication: We will discuss with patient's girlfriend DVT Prophylaxis:   Lovenox    Author: Annita Brod ,MD 09/26/2021 4:09 PM  To reach On-call, see care teams to locate the attending and reach out via [email or web address]. Between 7PM-7AM, please contact night-coverage If you still have difficulty reaching the attending provider, please page the [facility] (Director on Call) for [facility] on amion for assistance.

## 2021-09-26 NOTE — Progress Notes (Signed)
   09/26/21 0745  Assess: MEWS Score  Temp 98 F (36.7 C)  BP (!) 206/103  Pulse Rate 98  Resp 18  SpO2 97 %  O2 Device Room Air  Assess: MEWS Score  MEWS Temp 0  MEWS Systolic 2  MEWS Pulse 0  MEWS RR 0  MEWS LOC 0  MEWS Score 2  MEWS Score Color Yellow  Assess: if the MEWS score is Yellow or Red  Were vital signs taken at a resting state? Yes  Focused Assessment Change from prior assessment (see assessment flowsheet)  Does the patient meet 2 or more of the SIRS criteria? No  MEWS guidelines implemented *See Row Information* Yes  Treat  MEWS Interventions Other (Comment);Escalated (See documentation below)  Escalate  MEWS: Escalate Yellow: discuss with charge nurse/RN and consider discussing with provider and RRT  Notify: Charge Nurse/RN  Name of Charge Nurse/RN Notified Janett Billow  Date Charge Nurse/RN Notified 09/26/21  Time Charge Nurse/RN Notified 0800  Notify: Provider  Provider Name/Title Dr. Maryland Pink  Date Provider Notified 09/26/21  Time Provider Notified [phone]  Method of Notification Page  Notification Reason Critical result  Provider response See new orders  Date of Provider Response 09/26/21  Time of Provider Response 0800  Document  Patient Outcome Other (Comment) (PRN Hydralazine ordered, will give and continue to monitor.)  Progress note created (see row info) Yes  Assess: SIRS CRITERIA  SIRS Temperature  0  SIRS Pulse 1  SIRS Respirations  0  SIRS WBC 1  SIRS Score Sum  2

## 2021-09-26 NOTE — Progress Notes (Signed)
       CROSS COVER NOTE  NAME: Caitlyn Fuller MRN: [id number] DOB : 18-Oct-1975    Date of Service   09/26/2021   HPI/Events of Note   Notified of elevated CBG --> 544  Interventions   Assessment/Plan:  Nonketoic Hyperglycemia Novolog 45U 3AM POCT check      This document was prepared using Dragon voice recognition software and may include unintentional dictation errors.  Neomia Glass DNP, MHA, FNP-BC Nurse Practitioner [facility] Pager [phone]

## 2021-09-26 NOTE — Assessment & Plan Note (Signed)
Restarted on her Lipitor.  LDL at target.

## 2021-09-26 NOTE — Assessment & Plan Note (Addendum)
Overlaps with her chronic pain.  Patient in the past has had positive urine drug screens for multiple illicit substances.  She is currently on Suboxone.  She mentioned about going to a pain clinic in [place] for oxycodone, but states she does not go there anymore for because of lack of transportation.  She states that she recently started on Suboxone a few weeks ago, but denies that this is for any type of addiction in case this is for her chronic back pain.  PDMP reviewed 09/28/2021, taking Suboxone apparently 3 times daily (21 tablets for 7-day supply noted).  Denied request for additional Suboxone on discharge, she will need to get this from her primary doctor

## 2021-09-27 ENCOUNTER — Other Ambulatory Visit (HOSPITAL_COMMUNITY): Payer: Self-pay

## 2021-09-27 LAB — GLUCOSE, CAPILLARY
Glucose-Capillary: 330 mg/dL — ABNORMAL HIGH (ref 70–99)
Glucose-Capillary: 318 mg/dL — ABNORMAL HIGH (ref 70–99)
Glucose-Capillary: 385 mg/dL — ABNORMAL HIGH (ref 70–99)
Glucose-Capillary: >600 mg/dL (ref 70–99)
Glucose-Capillary: 459 mg/dL — ABNORMAL HIGH (ref 70–99)

## 2021-09-27 LAB — URINALYSIS, ROUTINE W REFLEX MICROSCOPIC
Bacteria, UA: NONE SEEN
Bilirubin Urine: NEGATIVE
Glucose, UA: >=500 mg/dL — AB
Ketones, ur: NEGATIVE mg/dL
Leukocytes,Ua: NEGATIVE
Nitrite: NEGATIVE
Protein, ur: NEGATIVE mg/dL
Specific Gravity, Urine: 1.016 (ref 1.005–1.030)
pH: 5 (ref 5.0–8.0)

## 2021-09-27 LAB — GLUCOSE, RANDOM: Glucose, Bld: 585 mg/dL (ref 70–99)

## 2021-09-27 LAB — CBC
HCT: 39.4 % (ref 36.0–46.0)
Hemoglobin: 12.6 g/dL (ref 12.0–15.0)
MCH: 26.9 pg (ref 26.0–34.0)
MCHC: 32 g/dL (ref 30.0–36.0)
MCV: 84 fL (ref 80.0–100.0)
Platelets: 182 10*3/uL (ref 150–400)
RBC: 4.69 MIL/uL (ref 3.87–5.11)
RDW: 13 % (ref 11.5–15.5)
WBC: 17.4 10*3/uL — ABNORMAL HIGH (ref 4.0–10.5)
nRBC: 0 % (ref 0.0–0.2)

## 2021-09-27 LAB — URINE DRUG SCREEN, QUALITATIVE (ARMC ONLY)
Amphetamines, Ur Screen: NOT DETECTED
Barbiturates, Ur Screen: NOT DETECTED
Benzodiazepine, Ur Scrn: NOT DETECTED
Cannabinoid 50 Ng, Ur ~~LOC~~: NOT DETECTED
Cocaine Metabolite,Ur ~~LOC~~: NOT DETECTED
MDMA (Ecstasy)Ur Screen: NOT DETECTED
Methadone Scn, Ur: NOT DETECTED
Opiate, Ur Screen: NOT DETECTED
Phencyclidine (PCP) Ur S: NOT DETECTED
Tricyclic, Ur Screen: NOT DETECTED

## 2021-09-27 LAB — LIPID PANEL
Cholesterol: 121 mg/dL (ref 0–200)
HDL: 48 mg/dL (ref >40)
LDL Cholesterol: 58 mg/dL (ref 0–99)
Total CHOL/HDL Ratio: 2.5 RATIO
Triglycerides: 77 mg/dL (ref <150)
VLDL: 15 mg/dL (ref 0–40)

## 2021-09-27 MED ORDER — KETOROLAC TROMETHAMINE 30 MG/ML IJ SOLN
30.0000 mg | Freq: Once | INTRAMUSCULAR | Status: DC
  Filled 2021-09-27: qty 1

## 2021-09-27 MED ORDER — ALPRAZOLAM 0.5 MG PO TABS: 1.0000 mg | ORAL_TABLET | Freq: Three times a day (TID) | ORAL | Status: DC | PRN

## 2021-09-27 MED ORDER — INSULIN ASPART 100 UNIT/ML IJ SOLN
12.0000 [IU] | Freq: Once | INTRAMUSCULAR | Status: AC
  Administered 2021-09-27: 12 [IU] via SUBCUTANEOUS

## 2021-09-27 MED ORDER — INSULIN GLARGINE-YFGN 100 UNIT/ML ~~LOC~~ SOLN
35.0000 [IU] | Freq: Every day | SUBCUTANEOUS | Status: DC
  Administered 2021-09-28: 35 [IU] via SUBCUTANEOUS
  Filled 2021-09-27: qty 0.35

## 2021-09-27 MED ORDER — METHYLPREDNISOLONE SODIUM SUCC 40 MG IJ SOLR
40.0000 mg | Freq: Every day | INTRAMUSCULAR | Status: DC
  Administered 2021-09-27 – 2021-09-29 (×3): 40 mg via INTRAVENOUS
  Filled 2021-09-27 (×3): qty 1

## 2021-09-27 MED ORDER — ISOSORBIDE MONONITRATE ER 60 MG PO TB24
60.0000 mg | ORAL_TABLET | Freq: Every day | ORAL | Status: DC
  Administered 2021-09-28 – 2021-09-30 (×3): 60 mg via ORAL
  Filled 2021-09-27 (×3): qty 1

## 2021-09-27 MED ORDER — INSULIN ASPART 100 UNIT/ML IJ SOLN
8.0000 [IU] | Freq: Three times a day (TID) | INTRAMUSCULAR | Status: DC
  Administered 2021-09-27 – 2021-09-28 (×3): 8 [IU] via SUBCUTANEOUS
  Filled 2021-09-27 (×4): qty 1

## 2021-09-27 MED ORDER — SODIUM CHLORIDE 0.9 % IV SOLN
3.0000 g | Freq: Four times a day (QID) | INTRAVENOUS | Status: DC
  Administered 2021-09-27 – 2021-09-30 (×11): 3 g via INTRAVENOUS
  Filled 2021-09-27 (×2): qty 8
  Filled 2021-09-27: qty 3
  Filled 2021-09-27: qty 8
  Filled 2021-09-27: qty 3
  Filled 2021-09-27 (×3): qty 8
  Filled 2021-09-27: qty 3
  Filled 2021-09-27 (×2): qty 8
  Filled 2021-09-27 (×2): qty 3

## 2021-09-27 MED ORDER — ISOSORBIDE MONONITRATE ER 30 MG PO TB24
30.0000 mg | ORAL_TABLET | ORAL | Status: AC
  Administered 2021-09-27: 30 mg via ORAL
  Filled 2021-09-27: qty 1

## 2021-09-27 MED ORDER — INSULIN GLARGINE-YFGN 100 UNIT/ML ~~LOC~~ SOLN
10.0000 [IU] | SUBCUTANEOUS | Status: AC
  Administered 2021-09-27: 10 [IU] via SUBCUTANEOUS
  Filled 2021-09-27: qty 0.1

## 2021-09-27 MED ORDER — ALPRAZOLAM 0.5 MG PO TABS
1.0000 mg | ORAL_TABLET | Freq: Three times a day (TID) | ORAL | Status: DC | PRN
  Administered 2021-09-29 – 2021-09-30 (×4): 1 mg via ORAL
  Filled 2021-09-27 (×4): qty 2

## 2021-09-27 MED ORDER — LORAZEPAM 2 MG/ML IJ SOLN
1.0000 mg | Freq: Once | INTRAMUSCULAR | Status: DC
  Filled 2021-09-27: qty 1

## 2021-09-27 MED ORDER — INSULIN ASPART 100 UNIT/ML IJ SOLN
35.0000 [IU] | Freq: Once | INTRAMUSCULAR | Status: AC
  Administered 2021-09-27: 35 [IU] via SUBCUTANEOUS
  Filled 2021-09-27: qty 1

## 2021-09-27 NOTE — Progress Notes (Signed)
Date and time results received: 09/27/21 1724 (use smartphrase ".now" to insert current time)  Test: glucose Critical Value: 585  Name of Provider Notified: Dr. Maryland Pink  Orders Received? Or Actions Taken?: Orders Received - See Orders for details per MD give 40 units of Novolog now. Will administer and continue to monitor.

## 2021-09-27 NOTE — Progress Notes (Signed)
Dr. Maryland Pink notified of glucometer reading "HIGH". Awaiting response

## 2021-09-27 NOTE — Consult Note (Signed)
Pharmacy Antibiotic Note  Caitlyn Fuller is a 46 y.o. female admitted on 09/25/2021 with cellulitis.  Pharmacy has been consulted for unasyn dosing.  Plan: Transition from Cefazolin 2g IV q8h to Unasyn  3g q6h IV for same total duration ending on 9/24.  Height: 4\' 10"  (147.3 cm) Weight: 97.5 kg (215 lb) IBW/kg (Calculated) : 40.9  Temp (24hrs), Avg:98.1 F (36.7 C), Min:97.6 F (36.4 C), Max:99.2 F (37.3 C)  Recent Labs  Lab 09/25/21 1502 09/25/21 1533 09/25/21 1925 09/25/21 2205 09/26/21 0534 09/27/21 1403  WBC 16.3*  --   --   --  16.0* 17.4*  CREATININE  --  0.67 0.86  --  0.96  --   LATICACIDVEN  --   --  1.6 1.2  --   --     Estimated Creatinine Clearance: 73.4 mL/min (by C-G formula based on SCr of 0.96 mg/dL).    Allergies  Allergen Reactions   Amlodipine Swelling   Gabapentin Anaphylaxis   Morphine And Related Anaphylaxis   Darvocet [Propoxyphene N-Acetaminophen] Hives   Toradol [Ketorolac Tromethamine] Other (See Comments)    migraines   Ultram [Tramadol] Hives   Vancomycin Itching    Antimicrobials this admission: Ancef (9/17 >> 9/19); then Unasyn (9/19 >> 9/24)  Dose adjustments this admission: CTM and adjust PRN  Microbiology results: 9/17 BCx: NGTD 9/17 COVID PCR: negative  Thank you for allowing pharmacy to be a part of this patient's care.  Shanon Brow Kura Bethards 09/27/2021 6:14 PM

## 2021-09-27 NOTE — Inpatient Diabetes Management (Addendum)
Inpatient Diabetes Program Recommendations  AACE/ADA: New Consensus Statement on Inpatient Glycemic Control (2015)  Target Ranges:  Prepandial:   less than 140 mg/dL      Peak postprandial:   less than 180 mg/dL (1-2 hours)      Critically ill patients:  140 - 180 mg/dL   Lab Results  Component Value Date   GLUCAP 385 (H) 09/27/2021   HGBA1C 11.8 (H) 09/26/2021    Review of Glycemic Control  Diabetes history: DM 2 Outpatient Diabetes medications:  Lantus 15 units daily Current orders for Inpatient glycemic control:  Semglee 15 units daily, Novolog 0-20 units tid with meals and HS Prednisone 40 mg daily  Inpatient Diabetes Program Recommendations:   Spoke with pt and daughter @ bedside about A1C results 11.8 (average blood glucose of 292 over the past 2-3 months time) and explained what an A1C is, basic pathophysiology of DM Type 2, basic home care, basic diabetes diet nutrition principles, importance of checking CBGs and maintaining good CBG control to prevent long-term and short-term complications. Reviewed signs and symptoms of hyperglycemia and hypoglycemia and how to treat hypoglycemia at home. Also reviewed blood sugar goals at home.  RNs to provide ongoing basic DM education at bedside with this patient. Have ordered educational booklet Living Well with Diabetes. Patient started crying and said she cannot talk anymore and was upset since her stroke.  Patient has not taken her insulin for the past month due to cost too high but uncertain of cost she had been paying. Pharmacy tech is checking on most affordable insulin for patient and will let us know.  Patient states she has a friend that she will ask more information because she has trouble remembering since her stroke.  Ordered Living Well With Diabetes for patient and family review.  Pharmacy check on Lantus showed $0 copay. Called daughter and gave information. Daughter shared she thinks patient needs a refill prescription.  Requested daughter to call her mom's PCP and request refill and office visit as needed.  Thank you, Nani Gasser. Bridgett Hattabaugh, RN, MSN, CDE  Diabetes Coordinator Inpatient Glycemic Control Team Team Pager [phone] (8am-5pm) 09/27/2021 3:23 PM

## 2021-09-27 NOTE — TOC CM/SW Note (Signed)
CSW acknowledges consult regarding difficulty with insulin prescription. Sent secure chat to diabetes coordinator.   Dayton Scrape, [place]

## 2021-09-27 NOTE — Progress Notes (Signed)
[facility] Progress Note  Patient: Caitlyn Fuller    [id number]  DOA: 09/25/2021    Date of Service: the patient was seen and examined on 09/27/2021  Brief hospital course: 46 year old female with past medical history of polysubstance abuse on Suboxone, poorly controlled diabetes mellitus, seizure disorder, morbid obesity and hypertension who presented to the emergency room on 9/17 for lip swelling that started when she woke up that morning.  Lip was swollen and painful to touch.  Patient noted to have markedly high blood pressures in the 190s and CBGs in the 300s.  Patient states that she has been out of medicines now for several weeks.  Patient brought in for treatment of facial cellulitis versus allergic reaction as well as treatment of high blood pressure and blood sugars.  Sepsis ruled out.  In the emergency room, she was given steroids and started back on her Lantus insulin at a reduced dose.  By following morning, blood sugars in the 500s although patient not in DKA.  By 9/19, blood sugars slowly improving.  Blood pressure starting to improve.  Patient continues to have significant pain and induration above upper lip which she can feel down into her gums and into her nares.  Assessment and Plan: Assessment and Plan: * Facial swelling Maxillofacial CT notes no evidence of any cellulitis, only mild soft tissue swelling.  White blood cell count remains elevated procalcitonin level normal.  Continue Benadryl, H2 blocker and steroids.  Does not appear to be angioedema.  Patient previously had been on ACE inhibitor, but has not been taking her medications for at least the last 3 weeks.  Check C1 esterase and alpha galactosidase.  Still persistent and can visualize swelling on her superior labial frenulum.  Patient received 1 dose of Unasyn in the emergency room and no further antibiotics were initially held.  However, given persistent symptoms and low-grade fever on admission, we will  go ahead and restart Unasyn.  Hypertensive urgency Patient has been off of all blood pressure medications for the past 3 weeks.  She has not had any follow-up.  No end-organ damage.  It is possible some of this could be withdrawals.  In the meantime, I have started scheduled Lopressor, hydralazine, and Imdur.  Trying to avoid Altace given an allergic reaction response.  Blood pressures are better although have been trending back upward slightly, so we will add increase Imdur.  Uncontrolled type 2 diabetes mellitus with hyperglycemia, with long-term current use of insulin (HCC) A1c earlier this year at 13 and this hospitalization at 11.8.  Patient has not been taking her insulin for at least a month.  CBGs are even more markedly elevated on admission given illness plus steroids for treating underlying allergic reaction.  In addition, she is getting steroids.  Have been steadily increasing Lantus, increase to 35 units this morning.  CBGs have been this evening although there is a question of compliance as nursing reports she was drinking soda, which reportedly was diet  Polysubstance abuse ([place]) Overlaps with her chronic pain.  Patient in the past has had positive urine drug screens for multiple illicit substances.  She is currently on Suboxone.  She mentioned about going to a pain clinic in [place] for oxycodone, but states she does not go there anymore for because of lack of transportation.  She states that she recently started on Suboxone a few weeks ago, but denies that this is for any type of addiction in case this is for her chronic  back pain.   For now, I have made it clear that she will not receive any additional opiates.  Continue Suboxone.  Patient refused Toradol and Ultram, citing allergy.  Seizure disorder [facility]) Patient reportedly at 1 point had been on Keppra, but it is unclear how long she has been off this medication.  She remains seizure-free and for now, we will leave her off  this medication.  Hyperlipidemia Restarted on her Lipitor.  LDL at target.  Obesity, Class III, BMI 40-49.9 (morbid obesity) (HCC) Meets criteria BMI greater than 40       Body mass index is 44.94 kg/m.        Consultants: None  Procedures: None  Antimicrobials: IV Unasyn x1 9/17, 9/19-present  Code Status: Full code   Subjective: Patient complains of lip swelling and pain extending up into her nares and above her gums.  Objective: Blood pressures are elevated Vitals:   09/27/21 1129 09/27/21 1638  BP: (!) 149/87 (!) 176/94  Pulse: 79 91  Resp: 18   Temp: 98.3 F (36.8 C) 99.2 F (37.3 C)  SpO2: 98% 98%    Intake/Output Summary (Last 24 hours) at 09/27/2021 1806 Last data filed at 09/27/2021 1500 Gross per 24 hour  Intake 523.86 ml  Output 0 ml  Net 523.86 ml    Filed Weights   09/25/21 1345  Weight: 97.5 kg   Body mass index is 44.94 kg/m.  Exam:  General: Alert and oriented x3, no acute distress H EENT: Normocephalic, atraumatic, mucous membranes are slightly dry.  Patient has some induration and swelling above her upper lip.  Observed erythema and bulbous swelling on superior labial frenulum Cardiovascular: Regular rate and rhythm, S1-S2 Respiratory: Clear to auscultation bilaterally Abdomen: Nontender, nondistended, positive bowel sounds Musculoskeletal: No clubbing or cyanosis or edema Skin: No skin breaks, tears or lesions Psychiatry: Appropriate, no evidence of psychoses Neurology: No focal deficits  Data Reviewed: White count remains elevated.  CBGs elevated, although improved earlier this morning and then increased as day progressed  Disposition:  Status is: Inpatient Remains inpatient appropriate because:  -Stabilization of blood pressure -Stable patient blood sugars -Improvement in facial swelling, presumed infectious and/or allergic inflammatory response    Anticipated discharge date: 9/21  Family Communication: Patient's  girlfriend at the bedside DVT Prophylaxis:   Lovenox    Author: Annita Brod ,MD 09/27/2021 6:06 PM  To reach On-call, see care teams to locate the attending and reach out via [email or web address]. Between 7PM-7AM, please contact night-coverage If you still have difficulty reaching the attending provider, please page the [facility] (Director on Call) for [facility] on amion for assistance.

## 2021-09-28 DIAGNOSIS — R220 Localized swelling, mass and lump, head: Secondary | ICD-10-CM

## 2021-09-28 LAB — BASIC METABOLIC PANEL
Anion gap: 6 (ref 5–15)
BUN: 28 mg/dL — ABNORMAL HIGH (ref 6–20)
CO2: 27 mmol/L (ref 22–32)
Calcium: 8.5 mg/dL — ABNORMAL LOW (ref 8.9–10.3)
Chloride: 99 mmol/L (ref 98–111)
Creatinine, Ser: 0.86 mg/dL (ref 0.44–1.00)
GFR, Estimated: >60 mL/min (ref >60)
Glucose, Bld: 544 mg/dL (ref 70–99)
Potassium: 4.6 mmol/L (ref 3.5–5.1)
Sodium: 132 mmol/L — ABNORMAL LOW (ref 135–145)

## 2021-09-28 LAB — CBC
HCT: 37.7 % (ref 36.0–46.0)
Hemoglobin: 12 g/dL (ref 12.0–15.0)
MCH: 26.7 pg (ref 26.0–34.0)
MCHC: 31.8 g/dL (ref 30.0–36.0)
MCV: 84 fL (ref 80.0–100.0)
Platelets: 206 10*3/uL (ref 150–400)
RBC: 4.49 MIL/uL (ref 3.87–5.11)
RDW: 12.9 % (ref 11.5–15.5)
WBC: 12.9 10*3/uL — ABNORMAL HIGH (ref 4.0–10.5)
nRBC: 0 % (ref 0.0–0.2)

## 2021-09-28 LAB — GLUCOSE, CAPILLARY
Glucose-Capillary: 470 mg/dL — ABNORMAL HIGH (ref 70–99)
Glucose-Capillary: 437 mg/dL — ABNORMAL HIGH (ref 70–99)
Glucose-Capillary: 430 mg/dL — ABNORMAL HIGH (ref 70–99)
Glucose-Capillary: 480 mg/dL — ABNORMAL HIGH (ref 70–99)
Glucose-Capillary: 505 mg/dL (ref 70–99)

## 2021-09-28 LAB — C1 ESTERASE INHIBITOR: C1INH SerPl-mCnc: 40 mg/dL — ABNORMAL HIGH (ref 21–39)

## 2021-09-28 MED ORDER — BUPRENORPHINE HCL-NALOXONE HCL 8-2 MG SL SUBL
1.0000 | SUBLINGUAL_TABLET | Freq: Three times a day (TID) | SUBLINGUAL | Status: DC
  Administered 2021-09-28 – 2021-09-30 (×7): 1 via SUBLINGUAL
  Filled 2021-09-28 (×8): qty 1

## 2021-09-28 MED ORDER — INSULIN ASPART 100 UNIT/ML IJ SOLN
30.0000 [IU] | Freq: Once | INTRAMUSCULAR | Status: AC
  Administered 2021-09-28: 30 [IU] via SUBCUTANEOUS
  Filled 2021-09-28: qty 1

## 2021-09-28 MED ORDER — INSULIN ASPART 100 UNIT/ML IJ SOLN
0.0000 [IU] | Freq: Three times a day (TID) | INTRAMUSCULAR | Status: DC
  Administered 2021-09-28: 35 [IU] via SUBCUTANEOUS

## 2021-09-28 MED ORDER — INSULIN ASPART 100 UNIT/ML IJ SOLN: 0.0000 [IU] | INTRAMUSCULAR | Status: DC

## 2021-09-28 MED ORDER — INSULIN GLARGINE-YFGN 100 UNIT/ML ~~LOC~~ SOLN
40.0000 [IU] | Freq: Every day | SUBCUTANEOUS | Status: DC
  Administered 2021-09-29 – 2021-09-30 (×2): 40 [IU] via SUBCUTANEOUS
  Filled 2021-09-28 (×2): qty 0.4

## 2021-09-28 MED ORDER — INSULIN ASPART 100 UNIT/ML IJ SOLN
0.0000 [IU] | INTRAMUSCULAR | Status: DC
  Administered 2021-09-28: 30 [IU] via SUBCUTANEOUS

## 2021-09-28 MED ORDER — LIDOCAINE VISCOUS HCL 2 % MT SOLN: 10.0000 mL | OROMUCOSAL | Status: DC | PRN

## 2021-09-28 NOTE — Progress Notes (Signed)
[facility] Progress Note  Patient: Caitlyn Fuller    [id number]  DOA: 09/25/2021    Date of Service: the patient was seen and examined on 09/28/2021  Brief hospital course: 46 year old female with past medical history of polysubstance abuse on Suboxone, poorly controlled diabetes mellitus, seizure disorder, morbid obesity and hypertension who presented to the emergency room on 9/17 for lip swelling that started when she woke up that morning.  Lip was swollen and painful to touch.  Patient noted to have markedly high blood pressures in the 190s and CBGs in the 300s.  Patient states that she has been out of medicines now for several weeks.  Patient brought in for treatment of facial cellulitis versus allergic reaction as well as treatment of high blood pressure and blood sugars.  Sepsis ruled out.  In the emergency room, she was given steroids and started back on her Lantus insulin at a reduced dose.  By following morning 09/18, blood sugars in the 500s although patient not in DKA. By 9/19, blood sugars slowly improving.  Blood pressure starting to improve.  Patient continues to have significant pain and induration above upper lip which she can feel down into her gums and into her nares. Unasyn started. 09/20 patient reported a white spot in her mouth which was draining, states swelling is still bothersome but better.  Requesting pain meds overnight, was not given opiates.  Patient states she takes Suboxone 3 times per day, reviewed PDMP and quantity 21 with 7-day supply is consistent with this.  Continues to be significantly hyperglycemic, thankfully no DKA.    Assessment and Plan: Assessment and Plan: * Facial swelling Maxillofacial CT notes no evidence of any cellulitis, only mild soft tissue swelling.  White blood cell count remains elevated procalcitonin level normal.  Continue Benadryl, H2 blocker and steroids.  Does not appear to be angioedema.  Patient previously had been on ACE  inhibitor, but has not been taking her medications for at least the last 3 weeks.  Check C1 esterase and alpha galactosidase.  C1 esterase is 40, upper limit of normal is 39.  Alpha galactosidase is in process.  Still persistent and can visualize swelling on her superior labial frenulum.  Patient received 1 dose of Unasyn in the emergency room and no further antibiotics were initially held.  However, given persistent symptoms and low-grade fever on admission, Unasyn was restarted yesterday and patient is reporting some improvement today.  May consider leaving Unasyn 48 hours total and transition to p.o. antibiotics if continues to improve.  Hypertensive urgency Patient has been off of all blood pressure medications for the past 3 weeks.  She has not had any follow-up.  No end-organ damage.  It is possible some of this could be withdrawals.  In the meantime, I have started scheduled Lopressor, hydralazine, and Imdur.  Trying to avoid Altace given concern for an allergic reaction response. Blood pressures are better also w/ increased Imdur  Uncontrolled type 2 diabetes mellitus with hyperglycemia, with long-term current use of insulin (HCC) A1c earlier this year at 13 and this hospitalization at 11.8.  Patient has not been taking her insulin for at least a month.  CBGs are even more markedly elevated on admission given illness plus steroids for treating underlying allergic reaction.  In addition, she is getting steroids. Have been steadily increasing Lantus to 40 units.  CBGs have been this evening although there is a question of compliance, getting outside food.  09/28/2021, sliding scale every 4  hours.  Diabetes educator following, will need to ensure access to insulin outpatient  Polysubstance abuse ([place]) Overlaps with her chronic pain.  Patient in the past has had positive urine drug screens for multiple illicit substances.  She is currently on Suboxone.  She mentioned about going to a pain clinic in  [place] for oxycodone, but states she does not go there anymore for because of lack of transportation.  She states that she recently started on Suboxone a few weeks ago, but denies that this is for any type of addiction in case this is for her chronic back pain.  PDMP reviewed 09/28/2021, taking Suboxone apparently 3 times daily (21 tablets for 7-day supply noted)  Seizure disorder [facility]) Patient reportedly at 1 point had been on Keppra, but it is unclear how long she has been off this medication.  She remains seizure-free and for now, we will leave her off this medication.  Hyperlipidemia Restarted on her Lipitor.  LDL at target.  Obesity, Class III, BMI 40-49.9 (morbid obesity) (HCC) Meets criteria BMI greater than 40       Body mass index is 44.94 kg/m.        Consultants: None  Procedures: None  Antimicrobials: IV Unasyn x1 9/17, 9/19-present  Code Status: Full code   Subjective: Patient complains of lip swelling and pain extending up into her nares and above her gums, complains of a white area inside the mouth, demonstrates this at upper frenulum, she reports overall facial swelling seems to have gone down a bit, no shortness of breath or cough.  Objective: Blood pressures are elevated Vitals:   09/28/21 0825 09/28/21 1130  BP: (!) 157/89 (!) 152/99  Pulse: 85 79  Resp: 18 18  Temp: 98.1 F (36.7 C) 98.1 F (36.7 C)  SpO2: 100% 96%    Intake/Output Summary (Last 24 hours) at 09/28/2021 1227 Last data filed at 09/28/2021 0500 Gross per 24 hour  Intake 450 ml  Output --  Net 450 ml   Filed Weights   09/25/21 1345  Weight: 97.5 kg   Body mass index is 44.94 kg/m.  Exam:  General: Alert and oriented x3, no acute distress H EENT: Normocephalic, atraumatic, mucous membranes are slightly dry.  Patient has some induration and swelling above her upper lip.  Observed erythema and bulbous swelling on superior labial frenulum Per yesterday's exam  documentation, today appears whitish and patient reports this "burst" consistent with mild abscess. fairly poor dentition/mild gingival disease Cardiovascular: Regular rate and rhythm, S1-S2 Respiratory: Clear to auscultation bilaterally Abdomen: Nontender, nondistended, positive bowel sounds Musculoskeletal: No clubbing or cyanosis or edema Skin: No skin breaks, tears or lesions Psychiatry: Appropriate, no evidence of psychoses Neurology: No focal deficits  Data Reviewed: White count remains elevated.  CBGs elevated, although improved earlier this morning and then increased as day progressed  Disposition:  Status is: Inpatient Remains inpatient appropriate because:  -Stabilization of blood pressure -Stable patient blood sugars -Improvement in facial swelling, presumed infectious and/or allergic inflammatory response    Anticipated discharge date: 9/21  Family Communication: Patient's girlfriend at the bedside DVT Prophylaxis:   Lovenox    Author: Emeterio Reeve ,MD 09/28/2021 12:27 PM  To reach On-call, see care teams to locate the attending and reach out via [email or web address]. Between 7PM-7AM, please contact night-coverage If you still have difficulty reaching the attending provider, please page the [facility] (Director on Call) for [facility] on amion for assistance.

## 2021-09-28 NOTE — TOC CM/SW Note (Signed)
  Transition of Care (TOC) Screening Note   Patient Details  Name: Caitlyn Fuller Date of Birth: Dec 21, 1975   Transition of Care [facility]) CM/SW Contact:    Candie Chroman, LCSW Phone Number: 09/28/2021, 3:42 PM    Transition of Care Department [facility]) has reviewed patient and no TOC needs have been identified at this time. We will continue to monitor patient advancement through interdisciplinary progression rounds. If new patient transition needs arise, please place a TOC consult.

## 2021-09-28 NOTE — Consult Note (Signed)
Pharmacy Antibiotic Note  Caitlyn Fuller is a 46 y.o. female admitted on 09/25/2021 with cellulitis.  Pharmacy has been consulted for Unasyn dosing.  Assessment: 45 yo F with PMH polysubstance abuse on Suboxone, seizure disorder, morbid obesity and hypertension. Maxillofacial CT is negative for cellulitis, only showing mild soft tissue swelling, but visually there is persistent swelling on superior labial frenulum. Pt is currently afebrile, with intermittent tachycardia. WBCs elevated at 12.9. Given low-grade fever on admission and pt-reported improvement in symptoms since transitioning from Ancef >> Unasyn, opting to continue Unasyn for at least 48 hrs before consideration of PO transition. Abx set to complete on 9/24.  Plan: Continue Unasyn 3 g IV q6H Monitor WBC and vital signs to assess for continued improvement on antibiotics Monitor Scr to assess for any necessary changes in Unasyn dosing  Height: 4\' 10"  (147.3 cm) Weight: 97.5 kg (215 lb) IBW/kg (Calculated) : 40.9  Temp (24hrs), Avg:98.5 F (36.9 C), Min:98.1 F (36.7 C), Max:99.2 F (37.3 C)  Recent Labs  Lab 09/25/21 1502 09/25/21 1533 09/25/21 1925 09/25/21 2205 09/26/21 0534 09/27/21 1403 09/28/21 0530  WBC 16.3*  --   --   --  16.0* 17.4* 12.9*  CREATININE  --  0.67 0.86  --  0.96  --  0.86  LATICACIDVEN  --   --  1.6 1.2  --   --   --      Estimated Creatinine Clearance: 81.9 mL/min (by C-G formula based on SCr of 0.86 mg/dL).    Allergies  Allergen Reactions   Amlodipine Swelling   Gabapentin Anaphylaxis   Morphine And Related Anaphylaxis   Darvocet [Propoxyphene N-Acetaminophen] Hives   Toradol [Ketorolac Tromethamine] Other (See Comments)    migraines   Ultram [Tramadol] Hives   Vancomycin Itching    Antimicrobials this admission: Ancef (9/17 >> 9/19) Unasyn (9/19 >> 9/24)  Dose adjustments this admission: CTM and adjust PRN  Microbiology results: 9/17 BCx: NGTD @ 2 days 9/17 COVID PCR:  negative  Thank you for allowing pharmacy to be a part of this patient's care.  Dara Hoyer, PharmD PGY-1 Pharmacy Resident 09/28/2021 1:40 PM

## 2021-09-28 NOTE — Inpatient Diabetes Management (Signed)
Inpatient Diabetes Program Recommendations  AACE/ADA: New Consensus Statement on Inpatient Glycemic Control (2015)  Target Ranges:  Prepandial:   less than 140 mg/dL      Peak postprandial:   less than 180 mg/dL (1-2 hours)      Critically ill patients:  140 - 180 mg/dL   Lab Results  Component Value Date   GLUCAP 470 (H) 09/28/2021   HGBA1C 11.8 (H) 09/26/2021    Review of Glycemic Control  Latest Reference Range & Units 09/26/21 13:43 09/26/21 17:22 09/26/21 20:54 09/27/21 04:39 09/27/21 07:23 09/27/21 11:11 09/27/21 16:33 09/27/21 21:22 09/28/21 08:26  Glucose-Capillary 70 - 99 mg/dL 544 (HH) 545 (HH) 569 (HH) 330 (H) 318 (H) 385 (H) >600 (HH) 459 (H) 470 (H)   Inpatient Diabetes Program Recommendations:   Please consider while on steroids: -Novolog 0-20 units increased to q 4 hrs.  Thank you, Nani Gasser. Jacen Carlini, RN, MSN, CDE  Diabetes Coordinator Inpatient Glycemic Control Team Team Pager [phone] (8am-5pm) 09/28/2021 9:43 AM

## 2021-09-29 DIAGNOSIS — I160 Hypertensive urgency: Secondary | ICD-10-CM

## 2021-09-29 DIAGNOSIS — E1165 Type 2 diabetes mellitus with hyperglycemia: Secondary | ICD-10-CM

## 2021-09-29 DIAGNOSIS — F1910 Other psychoactive substance abuse, uncomplicated: Secondary | ICD-10-CM

## 2021-09-29 DIAGNOSIS — Z794 Long term (current) use of insulin: Secondary | ICD-10-CM

## 2021-09-29 LAB — GLUCOSE, CAPILLARY
Glucose-Capillary: 303 mg/dL — ABNORMAL HIGH (ref 70–99)
Glucose-Capillary: 445 mg/dL — ABNORMAL HIGH (ref 70–99)
Glucose-Capillary: 505 mg/dL (ref 70–99)
Glucose-Capillary: 474 mg/dL — ABNORMAL HIGH (ref 70–99)
Glucose-Capillary: 470 mg/dL — ABNORMAL HIGH (ref 70–99)

## 2021-09-29 LAB — CBC
HCT: 38.5 % (ref 36.0–46.0)
Hemoglobin: 12.7 g/dL (ref 12.0–15.0)
MCH: 28.1 pg (ref 26.0–34.0)
MCHC: 33 g/dL (ref 30.0–36.0)
MCV: 85.2 fL (ref 80.0–100.0)
Platelets: 253 10*3/uL (ref 150–400)
RBC: 4.52 MIL/uL (ref 3.87–5.11)
RDW: 12.9 % (ref 11.5–15.5)
WBC: 13.3 10*3/uL — ABNORMAL HIGH (ref 4.0–10.5)
nRBC: 0 % (ref 0.0–0.2)

## 2021-09-29 LAB — BASIC METABOLIC PANEL
Anion gap: 6 (ref 5–15)
BUN: 31 mg/dL — ABNORMAL HIGH (ref 6–20)
CO2: 30 mmol/L (ref 22–32)
Calcium: 8.2 mg/dL — ABNORMAL LOW (ref 8.9–10.3)
Chloride: 97 mmol/L — ABNORMAL LOW (ref 98–111)
Creatinine, Ser: 0.85 mg/dL (ref 0.44–1.00)
GFR, Estimated: >60 mL/min (ref >60)
Glucose, Bld: 387 mg/dL — ABNORMAL HIGH (ref 70–99)
Potassium: 3.8 mmol/L (ref 3.5–5.1)
Sodium: 133 mmol/L — ABNORMAL LOW (ref 135–145)

## 2021-09-29 MED ORDER — INSULIN ASPART 100 UNIT/ML IJ SOLN
25.0000 [IU] | Freq: Once | INTRAMUSCULAR | Status: AC
  Administered 2021-09-29: 25 [IU] via SUBCUTANEOUS

## 2021-09-29 MED ORDER — PREDNISONE 20 MG PO TABS
40.0000 mg | ORAL_TABLET | Freq: Every day | ORAL | Status: DC
  Administered 2021-09-30: 40 mg via ORAL
  Filled 2021-09-29: qty 2

## 2021-09-29 MED ORDER — INSULIN ASPART 100 UNIT/ML IJ SOLN
0.0000 [IU] | INTRAMUSCULAR | Status: DC
  Administered 2021-09-29: 15 [IU] via SUBCUTANEOUS
  Administered 2021-09-29: 20 [IU] via SUBCUTANEOUS
  Administered 2021-09-30: 3 [IU] via SUBCUTANEOUS
  Administered 2021-09-30 (×2): 15 [IU] via SUBCUTANEOUS
  Administered 2021-09-30: 7 [IU] via SUBCUTANEOUS
  Filled 2021-09-29 (×8): qty 1

## 2021-09-29 MED ORDER — INSULIN ASPART 100 UNIT/ML IJ SOLN
40.0000 [IU] | Freq: Once | INTRAMUSCULAR | Status: AC
  Administered 2021-09-29: 40 [IU] via SUBCUTANEOUS

## 2021-09-29 NOTE — Progress Notes (Signed)
Pt noncompliant with diet throughout shift. This RN notified by dietary that pt had gone down to cafe and got excessive amounts of food from cafe.  Pt states that is was for daughter and daughters gf who were at bedside.  Pt educated on proper dietary choices and the effects on her body.  Pt states that she eats right and doesn't know why her sugar are so high.  Pt also admitted that she had been recording the staff at night.  She says that staff was mean and rude to her.  MD was at bedside when this was said.  Proper disciplines notified.

## 2021-09-29 NOTE — Progress Notes (Signed)
Inpatient Diabetes Program Recommendations  AACE/ADA: New Consensus Statement on Inpatient Glycemic Control (2015)  Target Ranges:  Prepandial:   less than 140 mg/dL      Peak postprandial:   less than 180 mg/dL (1-2 hours)      Critically ill patients:  140 - 180 mg/dL   Lab Results  Component Value Date   GLUCAP 445 (H) 09/29/2021   HGBA1C 11.8 (H) 09/26/2021    Review of Glycemic Control  Latest Reference Range & Units 09/28/21 11:29 09/28/21 16:35 09/28/21 20:55 09/28/21 22:42 09/29/21 08:18 09/29/21 11:23  Glucose-Capillary 70 - 99 mg/dL 437 (H) 430 (H) 480 (H) 505 (HH) 303 (H) 445 (H)  Diabetes history: DM 2 Outpatient Diabetes medications:  Lantus 15 units daily Current orders for Inpatient glycemic control:  Semglee 40 units daily, Novolog 0-20 units q 4 hours Prednisone 40 mg daily Inpatient Diabetes Program Recommendations:    Spoke to patient regarding A1C and elevated blood sugars.  She states that the sugary beverages and food belongs to her family and that she was not eating/drinking it.  She was tearful discussing that she did not get along with her nurse last night.  Attempted to encourage patient and discuss importance of controlling her blood sugars.  Hopefully blood sugars will improve as steroids tapered, although she would benefit from DM medication that would help with weight loss and insulin resistance.  Recommend close f/u with PCP and possibly endocrinology?  May benefit from a GLP-1 at discharge and also increase in home insulin?    Thanks,  Adah Perl, RN, BC-ADM Inpatient Diabetes Coordinator Pager [phone]  (8a-5p)

## 2021-09-29 NOTE — Progress Notes (Signed)
[facility] Progress Note  Patient: Caitlyn Fuller    UXL:[id number]  DOA: 09/25/2021    Date of Service: the patient was seen and examined on 09/29/2021  Brief hospital course: 46 year old female with past medical history of polysubstance abuse on Suboxone, poorly controlled diabetes mellitus, seizure disorder, morbid obesity and hypertension who presented to the emergency room on 9/17 for lip swelling that started when she woke up that morning.  Lip was swollen and painful to touch.  Patient noted to have markedly high blood pressures in the 190s and CBGs in the 300s.  Patient states that she has been out of medicines now for several weeks.  Patient brought in for treatment of facial cellulitis versus allergic reaction as well as treatment of high blood pressure and blood sugars.  Sepsis ruled out.    In the emergency room, she was given steroids and started back on her Lantus insulin at a reduced dose.  By following morning 09/18, blood sugars in the 500s although patient not in DKA.  Patient's been restarted on IV Unasyn is started to have some decrease in her swelling.  Her sugars remain markedly elevated however this looks to be secondary to dietary indiscretion as patient has had desserts and regular soda brought in.  Assessment and Plan: * Facial swelling Maxillofacial CT notes no evidence of any cellulitis, only mild soft tissue swelling.  White blood cell count remains elevated procalcitonin level normal.  Continue Benadryl, H2 blocker and steroids.  Does not appear to be angioedema.  Patient previously had been on ACE inhibitor, but has not been taking her medications for at least the last 3 weeks.  Check C1 esterase and alpha galactosidase.  C1 esterase is 40, upper limit of normal is 39.  Alpha galactosidase is in process.  Patient received 1 dose of Unasyn in the emergency room and then was restarted on 9/19.  We will plan to keep until 9/22 which will be 5 days of IV antibiotic  therapy and then switch to p.o.  Hypertensive urgency Patient has been off of all blood pressure medications for the past 3 weeks.  She has not had any follow-up.  No end-organ damage.  It is possible some of this could be withdrawals.  In the meantime, I have started scheduled Lopressor, hydralazine, and Imdur and Lasix.  Uncontrolled type 2 diabetes mellitus with hyperglycemia, with long-term current use of insulin (HCC) A1c earlier this year at 13 and this hospitalization at 11.8.  Patient has not been taking her insulin for at least a month.  CBGs are even more markedly elevated on admission given illness plus steroids for treating underlying allergic reaction.  Despite continued titration soft, sugars have remained high, this is in part due to patient having soda and desserts brought in.  Have spoken to nursing and we will confiscate these.  Attempted to call girlfriend, but no voicemail.  If more desserts are brought in, will have to ban visitors.  Polysubstance abuse ([place]) Overlaps with her chronic pain.  Patient in the past has had positive urine drug screens for multiple illicit substances.  She is currently on Suboxone.  She mentioned about going to a pain clinic in [place] for oxycodone, but states she does not go there anymore for because of lack of transportation.  She states that she recently started on Suboxone a few weeks ago, but denies that this is for any type of addiction in case this is for her chronic back pain.  PDMP  reviewed 09/28/2021, taking Suboxone apparently 3 times daily (21 tablets for 7-day supply noted)  Seizure disorder [facility]) Patient reportedly at 1 point had been on Keppra, but it is unclear how long she has been off this medication.  She remains seizure-free and for now, we will leave her off this medication.  Hyperlipidemia Restarted on her Lipitor.  LDL at target.  Obesity, Class III, BMI 40-49.9 (morbid obesity) (HCC) Meets criteria BMI greater than  40       Body mass index is 44.94 kg/m.        Consultants: None  Procedures: None  Antimicrobials: IV Unasyn x1 9/17, 9/19-present  Code Status: Full code   Subjective: Currently patient sleeping heavily  Objective: Blood pressures are elevated Vitals:   09/29/21 0817 09/29/21 1122  BP: 133/67 116/61  Pulse: 78 74  Resp: 19 20  Temp: 98.3 F (36.8 C) (!) 97.3 F (36.3 C)  SpO2: 99% 97%    Intake/Output Summary (Last 24 hours) at 09/29/2021 1413 Last data filed at 09/29/2021 0435 Gross per 24 hour  Intake 640 ml  Output --  Net 640 ml    Filed Weights   09/25/21 1345  Weight: 97.5 kg   Body mass index is 44.94 kg/m.  Exam:  General: Sleeping heavily, no acute distress H EENT: Normocephalic, atraumatic, mucous membranes slightly dry.  Some lip swelling although looks to be improved Cardiovascular: Regular rate and rhythm, S1-S2 Respiratory: Clear to auscultation bilaterally Abdomen: Nontender, nondistended, positive bowel sounds Musculoskeletal: No clubbing or cyanosis or edema Skin: No skin breaks, tears or lesions Psychiatry: Appropriate, no evidence of psychoses Neurology: No focal deficits  Data Reviewed: White count down from several days ago.  CBGs remain elevated in the 400s to 500s  Disposition:  Status is: Inpatient Remains inpatient appropriate because:  -Improvement in facial swelling and completion of IV antibiotics on 9/22    Anticipated discharge date: 9/22  Family Communication: Attempted to call patient's girlfriend on cell phone, no voicemail activated DVT Prophylaxis:   Lovenox    Author: Annita Brod ,MD 09/29/2021 2:13 PM  To reach On-call, see care teams to locate the attending and reach out via [email or web address]. Between 7PM-7AM, please contact night-coverage If you still have difficulty reaching the attending provider, please page the [facility] (Director on Call) for [facility] on amion for assistance.

## 2021-09-30 LAB — CULTURE, BLOOD (ROUTINE X 2)
Culture: NO GROWTH
Culture: NO GROWTH
Special Requests: ADEQUATE
Special Requests: ADEQUATE

## 2021-09-30 LAB — GLUCOSE, CAPILLARY
Glucose-Capillary: 139 mg/dL — ABNORMAL HIGH (ref 70–99)
Glucose-Capillary: 247 mg/dL — ABNORMAL HIGH (ref 70–99)
Glucose-Capillary: 308 mg/dL — ABNORMAL HIGH (ref 70–99)
Glucose-Capillary: 313 mg/dL — ABNORMAL HIGH (ref 70–99)

## 2021-09-30 MED ORDER — METOPROLOL TARTRATE 25 MG PO TABS: 25.0000 mg | ORAL_TABLET | Freq: Two times a day (BID) | ORAL | 11 refills | Status: DC

## 2021-09-30 MED ORDER — PREDNISONE 10 MG PO TABS: ORAL_TABLET | ORAL | 0 refills | Status: AC

## 2021-09-30 MED ORDER — ATORVASTATIN CALCIUM 10 MG PO TABS: 10.0000 mg | ORAL_TABLET | Freq: Every day | ORAL | 11 refills | Status: DC

## 2021-09-30 MED ORDER — FUROSEMIDE 40 MG PO TABS: 40.0000 mg | ORAL_TABLET | Freq: Every day | ORAL | 11 refills | Status: DC

## 2021-09-30 MED ORDER — INSULIN DETEMIR 100 UNIT/ML FLEXPEN: 40.0000 [IU] | PEN_INJECTOR | Freq: Every day | SUBCUTANEOUS | 11 refills | Status: DC

## 2021-09-30 MED ORDER — ISOSORBIDE MONONITRATE ER 60 MG PO TB24: 60.0000 mg | ORAL_TABLET | Freq: Every day | ORAL | 11 refills | Status: DC

## 2021-09-30 MED ORDER — HYDRALAZINE HCL 100 MG PO TABS: 100.0000 mg | ORAL_TABLET | Freq: Three times a day (TID) | ORAL | 11 refills | Status: DC

## 2021-09-30 NOTE — Discharge Summary (Signed)
Physician Discharge Summary   Patient: Caitlyn Fuller MRN: [id number] DOB: 1975-06-24  Admit date:     09/25/2021  Discharge date: 09/30/21  Discharge Physician: Hollice Espy   PCP: [facility]   Recommendations at discharge:   New medication: Lipitor 10 mg p.o. daily New medication: Lasix 40 mg p.o. daily New medication: Hydralazine 100 mg p.o. every 8 hours New medication: Levemir 40 units subcu daily New medication: Imdur 60 mg p.o. daily New med occasion: Lopressor 25 p.o. twice daily New medication: Rapid prednisone taper  Discharge Diagnoses: Principal Problem:   Facial swelling Active Problems:   Hypertensive urgency   Uncontrolled type 2 diabetes mellitus with hyperglycemia, with long-term current use of insulin (HCC)   Polysubstance abuse (HCC)   Seizure disorder (HCC)   Hyperlipidemia   Obesity, Class III, BMI 40-49.9 (morbid obesity) (HCC)  Resolved Problems:   * No resolved hospital problems. *  Hospital Course: 46 year old female with past medical history of polysubstance abuse on Suboxone, poorly controlled diabetes mellitus, seizure disorder, morbid obesity and hypertension who presented to the emergency room on 9/17 for lip swelling that started when she woke up that morning.  Lip was swollen and painful to touch.  Patient noted to have markedly high blood pressures in the 190s and CBGs in the 300s.  Patient states that she has been out of medicines now for several weeks.  Patient brought in for treatment of facial cellulitis versus allergic reaction as well as treatment of high blood pressure and blood sugars.  Sepsis ruled out.    In the emergency room, she was given steroids and started back on her Lantus insulin at a reduced dose.  By following morning 09/18, blood sugars in the 500s although patient not in DKA.  Patient's been restarted on IV Unasyn is started to have some decrease in her swelling.  Her sugars remain markedly  elevated however this looks to be secondary to dietary indiscretion as patient has had desserts and regular soda brought in.  By 9/22, patient completed 5 days of IV antibiotics and infection and swelling much improved.  Blood pressure better controlled and CBGs have been trending downward.  Felt to be stable for discharge.  Patient was given prescriptions for new blood pressure medications, insulin and Lipitor.  Assessment and Plan: * Facial swelling Maxillofacial CT notes no evidence of any cellulitis, only mild soft tissue swelling.  White blood cell count remains elevated procalcitonin level normal.  Continue Benadryl, H2 blocker and steroids.  Does not appear to be angioedema.  Patient previously had been on ACE inhibitor, but has not been taking her medications for at least the last 3 weeks.  Check C1 esterase and alpha galactosidase.  C1 esterase is 40, upper limit of normal is 39.  Alpha galactosidase is in process.  Patient received 1 dose of Unasyn in the emergency room and then was restarted on 9/19.  Completed 5 days of IV antibiotics by 9/22.  Patient felt to no longer need any further antibiotics.  Swelling and infection significantly improved.  Hypertensive urgency Patient has been off of all blood pressure medications for the past 3 weeks.  She has not had any follow-up.  No end-organ damage.  It is possible some of this could be withdrawals.  In the meantime, I have started scheduled Lopressor, hydralazine, and Imdur and Lasix.  Given prescriptions with 11 refills on discharge.  Blood pressure much improved  Uncontrolled type 2 diabetes mellitus with hyperglycemia,  with long-term current use of insulin (HCC) A1c earlier this year at 57 and this hospitalization at 11.8.  Patient has not been taking her insulin for at least a month.  CBGs are even more markedly elevated on admission given illness plus steroids for treating underlying allergic reaction.  Despite continued titration soft,  sugars have remained high, this is in part due to patient having soda and desserts brought in.  Have spoken to nursing and we will confiscate these.  Discharged on Levemir 40 units subcu daily.  Sugars will have some slight improvement once steroids are completed in a few days as well.  Polysubstance abuse (HCC) Overlaps with her chronic pain.  Patient in the past has had positive urine drug screens for multiple illicit substances.  She is currently on Suboxone.  She mentioned about going to a pain clinic in [place] for oxycodone, but states she does not go there anymore for because of lack of transportation.  She states that she recently started on Suboxone a few weeks ago, but denies that this is for any type of addiction in case this is for her chronic back pain.  PDMP reviewed 09/28/2021, taking Suboxone apparently 3 times daily (21 tablets for 7-day supply noted).  Denied request for additional Suboxone on discharge, she will need to get this from her primary doctor  Seizure disorder [facility]) Patient reportedly at 1 point had been on Keppra, but it is unclear how long she has been off this medication.  She remains seizure-free and for now, we will leave her off this medication.  Hyperlipidemia Restarted on her Lipitor.  LDL at target.  Obesity, Class III, BMI 40-49.9 (morbid obesity) (HCC) Meets criteria BMI greater than 40        Pain control - [place] Controlled Substance Reporting System database was reviewed. and patient was instructed, not to drive, operate heavy machinery, perform activities at heights, swimming or participation in water activities or provide baby-sitting services while on Pain, Sleep and Anxiety Medications; until their outpatient Physician has advised to do so again. Also recommended to not to take more than prescribed Pain, Sleep and Anxiety Medications.  Consultants: None Procedures performed: None Disposition: Home Diet recommendation:  Discharge Diet  Orders (From admission, onward)     Start     Ordered   09/30/21 0000  Diet - low sodium heart healthy        09/30/21 1204   09/30/21 0000  Diet Carb Modified        09/30/21 1204           Cardiac and Carb modified diet DISCHARGE MEDICATION: Allergies as of 09/30/2021       Reactions   Amlodipine Swelling   Gabapentin Anaphylaxis   Morphine And Related Anaphylaxis   Darvocet [propoxyphene N-acetaminophen] Hives   Toradol [ketorolac Tromethamine] Other (See Comments)   migraines   Ultram [tramadol] Hives   Vancomycin Itching        Medication List     STOP taking these medications    amLODipine 10 MG tablet Commonly known as: NORVASC   insulin glargine 100 UNIT/ML injection Commonly known as: LANTUS   levETIRAcetam ER 1000 MG Tb24   oxyCODONE 15 MG immediate release tablet Commonly known as: ROXICODONE   ramipril 5 MG capsule Commonly known as: ALTACE       TAKE these medications    aspirin EC 81 MG tablet Take 1 tablet (81 mg total) by mouth daily. Swallow whole.   atorvastatin  10 MG tablet Commonly known as: LIPITOR Take 1 tablet (10 mg total) by mouth daily. Start taking on: October 01, 2021 What changed:  medication strength how much to take when to take this   buprenorphine-naloxone 8-2 mg Subl SL tablet Commonly known as: SUBOXONE Place under the tongue.   furosemide 40 MG tablet Commonly known as: Lasix Take 1 tablet (40 mg total) by mouth daily.   hydrALAZINE 100 MG tablet Commonly known as: APRESOLINE Take 1 tablet (100 mg total) by mouth every 8 (eight) hours.   insulin detemir 100 UNIT/ML FlexPen Commonly known as: LEVEMIR Inject 40 Units into the skin daily. Start taking on: October 01, 2021   isosorbide mononitrate 60 MG 24 hr tablet Commonly known as: IMDUR Take 1 tablet (60 mg total) by mouth daily. Start taking on: October 01, 2021   metoprolol tartrate 25 MG tablet Commonly known as: LOPRESSOR Take 1  tablet (25 mg total) by mouth 2 (two) times daily.   predniSONE 10 MG tablet Commonly known as: DELTASONE Take 3 tablets (30 mg total) by mouth daily with breakfast for 1 day, THEN 2 tablets (20 mg total) daily with breakfast for 1 day, THEN 1 tablet (10 mg total) daily with breakfast for 1 day. Start taking on: October 01, 2021        Discharge Exam: Ceasar MonsFiled Weights   09/25/21 1345  Weight: 97.5 kg   General: Alert and oriented x3, no acute distress Cardiovascular: Regular rate and rhythm, S1-S2 Lungs: Clear to auscultation bilaterally acute  Condition at discharge: good  The results of significant diagnostics from this hospitalization (including imaging, microbiology, ancillary and laboratory) are listed below for reference.   Imaging Studies: CT Maxillofacial W Contrast  Result Date: 09/25/2021 CLINICAL DATA:  Facial swelling rule out abscess or osteomyelitis EXAM: CT MAXILLOFACIAL WITH CONTRAST TECHNIQUE: Multidetector CT imaging of the maxillofacial structures was performed with intravenous contrast. Multiplanar CT image reconstructions were also generated. RADIATION DOSE REDUCTION: This exam was performed according to the departmental dose-optimization program which includes automated exposure control, adjustment of the mA and/or kV according to patient size and/or use of iterative reconstruction technique. CONTRAST:  75mL OMNIPAQUE IOHEXOL 300 MG/ML  SOLN COMPARISON:  None Available. FINDINGS: Osseous: Negative for fracture or osteomyelitis. Dental caries in the molars bilaterally. No periapical abscess. Orbits: Negative for mass or edema. Sinuses: Mild mucosal edema paranasal sinuses. No air-fluid level. Mastoid and middle ear clear bilaterally Soft tissues: Soft tissue swelling upper lip. No mass or adenopathy identified. Limited intracranial: Negative IMPRESSION: Negative for abscess or osteomyelitis. Soft tissue swelling upper lip Dental caries without periapical abscess Mild  mucosal edema paranasal sinuses Electronically Signed   By: Marlan Palauharles  Clark M.D.   On: 09/25/2021 17:17   DG Chest 2 View  Result Date: 09/25/2021 CLINICAL DATA:  Cough, fever and chills. EXAM: CHEST - 2 VIEW COMPARISON:  05/02/2021. FINDINGS: Cardiac silhouette is normal in size. No mediastinal or hilar masses or evidence of adenopathy. Clear lungs.  No pleural effusion or pneumothorax. Skeletal structures are intact. IMPRESSION: No active cardiopulmonary disease. Electronically Signed   By: Amie Portlandavid  Ormond M.D.   On: 09/25/2021 14:41    Microbiology: Results for orders placed or performed during the hospital encounter of 09/25/21  SARS Coronavirus 2 by RT PCR (hospital order[facility]) *cepheid single result test* Anterior Nasal Swab     Status: None   Collection Time: 09/25/21  3:02 PM   Specimen: Anterior Nasal Swab  Result Value Ref Range Status   SARS Coronavirus 2 by RT PCR NEGATIVE NEGATIVE Final    Comment: (NOTE) SARS-CoV-2 target nucleic acids are NOT DETECTED.  The SARS-CoV-2 RNA is generally detectable in upper and lower respiratory specimens during the acute phase of infection. The lowest concentration of SARS-CoV-2 viral copies this assay can detect is 250 copies / mL. A negative result does not preclude SARS-CoV-2 infection and should not be used as the sole basis for treatment or other patient management decisions.  A negative result may occur with improper specimen collection / handling, submission of specimen other than nasopharyngeal swab, presence of viral mutation(s) within the areas targeted by this assay, and inadequate number of viral copies (<250 copies / mL). A negative result must be combined with clinical observations, patient history, and epidemiological information.  Fact Sheet for Patients:   https://www.patel.info/  Fact Sheet for Healthcare Providers: https://hall.com/  This test is  not yet approved or  cleared by the [place] FDA and has been authorized for detection and/or diagnosis of SARS-CoV-2 by FDA under an Emergency Use Authorization (EUA).  This EUA will remain in effect (meaning this test can be used) for the duration of the COVID-19 declaration under Section 564(b)(1) of the Act, 21 U.S.C. section 360bbb-3(b)(1), unless the authorization is terminated or revoked sooner.  Performed at [facility], [address] [place], [place] [address]   Culture, blood (Routine X 2) w Reflex to ID Panel     Status: None   Collection Time: 09/25/21  7:25 PM   Specimen: BLOOD  Result Value Ref Range Status   Specimen Description BLOOD RIGHT ANTECUBITAL  Final   Special Requests   Final    BOTTLES DRAWN AEROBIC AND ANAEROBIC Blood Culture adequate volume   Culture   Final    NO GROWTH 5 DAYS Performed at [facility], [address] [place], [place] [address]    Report Status 09/30/2021 FINAL  Final  Culture, blood (Routine X 2) w Reflex to ID Panel     Status: None   Collection Time: 09/25/21  7:31 PM   Specimen: BLOOD  Result Value Ref Range Status   Specimen Description BLOOD LEFT ANTECUBITAL  Final   Special Requests   Final    BOTTLES DRAWN AEROBIC AND ANAEROBIC Blood Culture adequate volume   Culture   Final    NO GROWTH 5 DAYS Performed at [facility], [address] [place], [place] [address]    Report Status 09/30/2021 FINAL  Final    Labs: CBC: Recent Labs  Lab 09/25/21 1502 09/26/21 0534 09/27/21 1403 09/28/21 0530 09/29/21 0540  WBC 16.3* 16.0* 17.4* 12.9* 13.3*  NEUTROABS 12.9*  --   --   --   --   HGB 14.0 14.0 12.6 12.0 12.7  HCT 43.6 43.8 39.4 37.7 38.5  MCV 84.7 84.7 84.0 84.0 85.2  PLT 172 161 182 206 341   Basic Metabolic Panel: Recent Labs  Lab 09/25/21 1533 09/25/21 1925 09/26/21 0534 09/26/21 1254 09/27/21 1702 09/28/21 0530 09/29/21 0540  NA 132*  --  130*  --   --  132* 133*   K 4.2  --  4.2  --   --  4.6 3.8  CL 97*  --  92*  --   --  99 97*  CO2 27  --  26  --   --  27 30  GLUCOSE 378*  --  593* 561* 585* 544* 387*  BUN 10  --  18  --   --  28* 31*  CREATININE 0.67 0.86 0.96  --   --  0.86 0.85  CALCIUM 7.8*  --  8.5*  --   --  8.5* 8.2*   Liver Function Tests: Recent Labs  Lab 09/25/21 1533  AST 17  ALT 11  ALKPHOS 65  BILITOT 1.4*  PROT 6.6  ALBUMIN 3.0*   CBG: Recent Labs  Lab 09/29/21 2217 09/30/21 0045 09/30/21 0509 09/30/21 0811 09/30/21 1136  GLUCAP 470* 308* 247* 139* 313*    Discharge time spent: less than 30 minutes.  Signed: Hollice Espy, MD [facility] 09/30/2021

## 2021-10-06 LAB — ALPHA GALACTOSIDASE: Alpha galactosidase, serum: 31 nmol/hr/mg prt — ABNORMAL LOW (ref >35.5)

## 2021-11-16 ENCOUNTER — Encounter: Payer: Self-pay | Admitting: Emergency Medicine

## 2021-11-16 ENCOUNTER — Emergency Department: Payer: Medicaid Other

## 2021-11-16 ENCOUNTER — Inpatient Hospital Stay
Admission: EM | Admit: 2021-11-16 | Discharge: 2021-11-22 | DRG: 291 | Disposition: A | Discharge status: Home / Self Care | Payer: Medicaid Other | Attending: Internal Medicine | Admitting: Internal Medicine

## 2021-11-16 DIAGNOSIS — G40909 Epilepsy, unspecified, not intractable, without status epilepticus: Secondary | ICD-10-CM | POA: Diagnosis present

## 2021-11-16 DIAGNOSIS — I2489 Other forms of acute ischemic heart disease: Secondary | ICD-10-CM | POA: Diagnosis present

## 2021-11-16 DIAGNOSIS — I5A Non-ischemic myocardial injury (non-traumatic): Secondary | ICD-10-CM | POA: Diagnosis present

## 2021-11-16 DIAGNOSIS — K219 Gastro-esophageal reflux disease without esophagitis: Secondary | ICD-10-CM | POA: Diagnosis present

## 2021-11-16 DIAGNOSIS — E871 Hypo-osmolality and hyponatremia: Secondary | ICD-10-CM | POA: Diagnosis present

## 2021-11-16 DIAGNOSIS — G8929 Other chronic pain: Secondary | ICD-10-CM | POA: Diagnosis present

## 2021-11-16 DIAGNOSIS — F1910 Other psychoactive substance abuse, uncomplicated: Secondary | ICD-10-CM | POA: Diagnosis present

## 2021-11-16 DIAGNOSIS — F17210 Nicotine dependence, cigarettes, uncomplicated: Secondary | ICD-10-CM | POA: Diagnosis present

## 2021-11-16 DIAGNOSIS — R42 Dizziness and giddiness: Secondary | ICD-10-CM

## 2021-11-16 DIAGNOSIS — Z888 Allergy status to other drugs, medicaments and biological substances status: Secondary | ICD-10-CM | POA: Diagnosis not present

## 2021-11-16 DIAGNOSIS — E1100 Type 2 diabetes mellitus with hyperosmolarity without nonketotic hyperglycemic-hyperosmolar coma (NKHHC): Secondary | ICD-10-CM | POA: Diagnosis present

## 2021-11-16 DIAGNOSIS — E1165 Type 2 diabetes mellitus with hyperglycemia: Secondary | ICD-10-CM | POA: Diagnosis present

## 2021-11-16 DIAGNOSIS — F419 Anxiety disorder, unspecified: Secondary | ICD-10-CM | POA: Diagnosis present

## 2021-11-16 DIAGNOSIS — Z720 Tobacco use: Secondary | ICD-10-CM | POA: Diagnosis present

## 2021-11-16 DIAGNOSIS — I5032 Chronic diastolic (congestive) heart failure: Secondary | ICD-10-CM | POA: Diagnosis present

## 2021-11-16 DIAGNOSIS — I110 Hypertensive heart disease with heart failure: Secondary | ICD-10-CM | POA: Diagnosis not present

## 2021-11-16 DIAGNOSIS — I160 Hypertensive urgency: Secondary | ICD-10-CM

## 2021-11-16 DIAGNOSIS — Z885 Allergy status to narcotic agent status: Secondary | ICD-10-CM

## 2021-11-16 DIAGNOSIS — Z7982 Long term (current) use of aspirin: Secondary | ICD-10-CM

## 2021-11-16 DIAGNOSIS — Z881 Allergy status to other antibiotic agents status: Secondary | ICD-10-CM | POA: Diagnosis not present

## 2021-11-16 DIAGNOSIS — Z794 Long term (current) use of insulin: Secondary | ICD-10-CM

## 2021-11-16 DIAGNOSIS — I161 Hypertensive emergency: Secondary | ICD-10-CM | POA: Diagnosis present

## 2021-11-16 DIAGNOSIS — Z79899 Other long term (current) drug therapy: Secondary | ICD-10-CM | POA: Diagnosis not present

## 2021-11-16 DIAGNOSIS — Z1152 Encounter for screening for COVID-19: Secondary | ICD-10-CM

## 2021-11-16 DIAGNOSIS — Z8673 Personal history of transient ischemic attack (TIA), and cerebral infarction without residual deficits: Secondary | ICD-10-CM | POA: Diagnosis not present

## 2021-11-16 DIAGNOSIS — R7989 Other specified abnormal findings of blood chemistry: Secondary | ICD-10-CM | POA: Diagnosis not present

## 2021-11-16 DIAGNOSIS — R739 Hyperglycemia, unspecified: Secondary | ICD-10-CM | POA: Diagnosis present

## 2021-11-16 DIAGNOSIS — I10 Essential (primary) hypertension: Secondary | ICD-10-CM

## 2021-11-16 DIAGNOSIS — G894 Chronic pain syndrome: Secondary | ICD-10-CM | POA: Diagnosis not present

## 2021-11-16 DIAGNOSIS — I509 Heart failure, unspecified: Principal | ICD-10-CM

## 2021-11-16 DIAGNOSIS — E785 Hyperlipidemia, unspecified: Secondary | ICD-10-CM | POA: Diagnosis present

## 2021-11-16 DIAGNOSIS — I5033 Acute on chronic diastolic (congestive) heart failure: Secondary | ICD-10-CM | POA: Diagnosis present

## 2021-11-16 DIAGNOSIS — Z6841 Body Mass Index (BMI) 40.0 and over, adult: Secondary | ICD-10-CM

## 2021-11-16 DIAGNOSIS — Z8249 Family history of ischemic heart disease and other diseases of the circulatory system: Secondary | ICD-10-CM

## 2021-11-16 DIAGNOSIS — I5031 Acute diastolic (congestive) heart failure: Secondary | ICD-10-CM | POA: Diagnosis not present

## 2021-11-16 DIAGNOSIS — J45909 Unspecified asthma, uncomplicated: Secondary | ICD-10-CM | POA: Diagnosis present

## 2021-11-16 LAB — URINALYSIS, ROUTINE W REFLEX MICROSCOPIC
Bacteria, UA: NONE SEEN
Bilirubin Urine: NEGATIVE
Glucose, UA: >=500 mg/dL — AB
Ketones, ur: NEGATIVE mg/dL
Leukocytes,Ua: NEGATIVE
Nitrite: NEGATIVE
Protein, ur: 30 mg/dL — AB
RBC / HPF: >50 RBC/hpf — ABNORMAL HIGH (ref 0–5)
Specific Gravity, Urine: 1.017 (ref 1.005–1.030)
pH: 5 (ref 5.0–8.0)

## 2021-11-16 LAB — URINE DRUG SCREEN, QUALITATIVE (ARMC ONLY)
Amphetamines, Ur Screen: NOT DETECTED
Barbiturates, Ur Screen: NOT DETECTED
Benzodiazepine, Ur Scrn: NOT DETECTED
Cannabinoid 50 Ng, Ur ~~LOC~~: NOT DETECTED
Cocaine Metabolite,Ur ~~LOC~~: POSITIVE — AB
MDMA (Ecstasy)Ur Screen: NOT DETECTED
Methadone Scn, Ur: NOT DETECTED
Opiate, Ur Screen: NOT DETECTED
Phencyclidine (PCP) Ur S: NOT DETECTED
Tricyclic, Ur Screen: NOT DETECTED

## 2021-11-16 LAB — BASIC METABOLIC PANEL
Anion gap: 8 (ref 5–15)
BUN: 35 mg/dL — ABNORMAL HIGH (ref 6–20)
CO2: 24 mmol/L (ref 22–32)
Calcium: 9.7 mg/dL (ref 8.9–10.3)
Chloride: 106 mmol/L (ref 98–111)
Creatinine, Ser: 0.85 mg/dL (ref 0.44–1.00)
GFR, Estimated: >60 mL/min (ref >60)
Glucose, Bld: 136 mg/dL — ABNORMAL HIGH (ref 70–99)
Potassium: 4.5 mmol/L (ref 3.5–5.1)
Sodium: 138 mmol/L (ref 135–145)

## 2021-11-16 LAB — COMPREHENSIVE METABOLIC PANEL
ALT: 17 U/L (ref 0–44)
AST: 18 U/L (ref 15–41)
Albumin: 3.2 g/dL — ABNORMAL LOW (ref 3.5–5.0)
Alkaline Phosphatase: 80 U/L (ref 38–126)
Anion gap: 9 (ref 5–15)
BUN: 22 mg/dL — ABNORMAL HIGH (ref 6–20)
CO2: 27 mmol/L (ref 22–32)
Calcium: 8.5 mg/dL — ABNORMAL LOW (ref 8.9–10.3)
Chloride: 95 mmol/L — ABNORMAL LOW (ref 98–111)
Creatinine, Ser: 0.94 mg/dL (ref 0.44–1.00)
GFR, Estimated: >60 mL/min (ref >60)
Glucose, Bld: 621 mg/dL (ref 70–99)
Potassium: 4 mmol/L (ref 3.5–5.1)
Sodium: 131 mmol/L — ABNORMAL LOW (ref 135–145)
Total Bilirubin: 0.5 mg/dL (ref 0.3–1.2)
Total Protein: 6.8 g/dL (ref 6.5–8.1)

## 2021-11-16 LAB — TSH: TSH: 1.12 u[IU]/mL (ref 0.350–4.500)

## 2021-11-16 LAB — CBC
HCT: 42.4 % (ref 36.0–46.0)
Hemoglobin: 13.4 g/dL (ref 12.0–15.0)
MCH: 25.7 pg — ABNORMAL LOW (ref 26.0–34.0)
MCHC: 31.6 g/dL (ref 30.0–36.0)
MCV: 81.2 fL (ref 80.0–100.0)
Platelets: 256 10*3/uL (ref 150–400)
RBC: 5.22 MIL/uL — ABNORMAL HIGH (ref 3.87–5.11)
RDW: 13.4 % (ref 11.5–15.5)
WBC: 11.4 10*3/uL — ABNORMAL HIGH (ref 4.0–10.5)
nRBC: 0 % (ref 0.0–0.2)

## 2021-11-16 LAB — OSMOLALITY: Osmolality: 298 mOsm/kg — ABNORMAL HIGH (ref 275–295)

## 2021-11-16 LAB — TROPONIN I (HIGH SENSITIVITY)
Troponin I (High Sensitivity): 87 ng/L — ABNORMAL HIGH (ref <18)
Troponin I (High Sensitivity): 92 ng/L — ABNORMAL HIGH (ref <18)
Troponin I (High Sensitivity): 109 ng/L (ref <18)

## 2021-11-16 LAB — CBG MONITORING, ED
Glucose-Capillary: 244 mg/dL — ABNORMAL HIGH (ref 70–99)
Glucose-Capillary: 254 mg/dL — ABNORMAL HIGH (ref 70–99)
Glucose-Capillary: 258 mg/dL — ABNORMAL HIGH (ref 70–99)
Glucose-Capillary: 475 mg/dL — ABNORMAL HIGH (ref 70–99)

## 2021-11-16 LAB — SARS CORONAVIRUS 2 BY RT PCR: SARS Coronavirus 2 by RT PCR: NEGATIVE

## 2021-11-16 LAB — MAGNESIUM: Magnesium: 1.8 mg/dL (ref 1.7–2.4)

## 2021-11-16 LAB — T4, FREE: Free T4: 0.98 ng/dL (ref 0.61–1.12)

## 2021-11-16 LAB — BRAIN NATRIURETIC PEPTIDE: B Natriuretic Peptide: 132.7 pg/mL — ABNORMAL HIGH (ref 0.0–100.0)

## 2021-11-16 MED ORDER — ACETAMINOPHEN 325 MG PO TABS
650.0000 mg | ORAL_TABLET | Freq: Four times a day (QID) | ORAL | Status: DC | PRN
  Administered 2021-11-17 – 2021-11-22 (×5): 650 mg via ORAL
  Filled 2021-11-16 (×5): qty 2

## 2021-11-16 MED ORDER — DEXTROSE 50 % IV SOLN: 0.0000 mL | INTRAVENOUS | Status: DC | PRN

## 2021-11-16 MED ORDER — ATORVASTATIN CALCIUM 10 MG PO TABS
10.0000 mg | ORAL_TABLET | Freq: Every day | ORAL | Status: DC
  Administered 2021-11-16 – 2021-11-22 (×7): 10 mg via ORAL
  Filled 2021-11-16 (×8): qty 1

## 2021-11-16 MED ORDER — HYDROXYZINE HCL 25 MG PO TABS
25.0000 mg | ORAL_TABLET | Freq: Three times a day (TID) | ORAL | Status: DC | PRN
  Administered 2021-11-17 (×2): 25 mg via ORAL
  Filled 2021-11-16 (×2): qty 1

## 2021-11-16 MED ORDER — POTASSIUM CHLORIDE 10 MEQ/100ML IV SOLN
10.0000 meq | INTRAVENOUS | Status: AC
  Administered 2021-11-16 (×2): 10 meq via INTRAVENOUS
  Filled 2021-11-16 (×2): qty 100

## 2021-11-16 MED ORDER — LORAZEPAM 2 MG/ML IJ SOLN
1.0000 mg | Freq: Once | INTRAMUSCULAR | Status: AC
  Administered 2021-11-16: 1 mg via INTRAVENOUS
  Filled 2021-11-16: qty 1

## 2021-11-16 MED ORDER — BUPRENORPHINE HCL-NALOXONE HCL 8-2 MG SL SUBL
1.0000 | SUBLINGUAL_TABLET | Freq: Every day | SUBLINGUAL | Status: DC
  Administered 2021-11-16: 1 via SUBLINGUAL
  Filled 2021-11-16: qty 1

## 2021-11-16 MED ORDER — LOSARTAN POTASSIUM 50 MG PO TABS
100.0000 mg | ORAL_TABLET | Freq: Every day | ORAL | Status: DC
  Administered 2021-11-16 – 2021-11-18 (×2): 100 mg via ORAL
  Filled 2021-11-16 (×3): qty 2

## 2021-11-16 MED ORDER — NICOTINE 21 MG/24HR TD PT24
21.0000 mg | MEDICATED_PATCH | Freq: Every day | TRANSDERMAL | Status: DC
  Filled 2021-11-16 (×5): qty 1

## 2021-11-16 MED ORDER — ASPIRIN 81 MG PO CHEW
324.0000 mg | CHEWABLE_TABLET | Freq: Once | ORAL | Status: AC
  Administered 2021-11-16: 324 mg via ORAL
  Filled 2021-11-16: qty 4

## 2021-11-16 MED ORDER — HYDRALAZINE HCL 50 MG PO TABS
100.0000 mg | ORAL_TABLET | Freq: Three times a day (TID) | ORAL | Status: DC
  Administered 2021-11-16 – 2021-11-17 (×2): 100 mg via ORAL
  Filled 2021-11-16 (×2): qty 2

## 2021-11-16 MED ORDER — INSULIN REGULAR(HUMAN) IN NACL 100-0.9 UT/100ML-% IV SOLN
INTRAVENOUS | Status: DC
  Administered 2021-11-16: 18 [IU]/h via INTRAVENOUS
  Filled 2021-11-16: qty 100

## 2021-11-16 MED ORDER — ONDANSETRON HCL 4 MG/2ML IJ SOLN
4.0000 mg | Freq: Three times a day (TID) | INTRAMUSCULAR | Status: DC | PRN
  Administered 2021-11-21 (×2): 4 mg via INTRAVENOUS
  Filled 2021-11-16 (×2): qty 2

## 2021-11-16 MED ORDER — LACTATED RINGERS IV SOLN: INTRAVENOUS | Status: DC

## 2021-11-16 MED ORDER — FUROSEMIDE 10 MG/ML IJ SOLN
40.0000 mg | Freq: Once | INTRAMUSCULAR | Status: AC
  Administered 2021-11-16: 40 mg via INTRAVENOUS
  Filled 2021-11-16: qty 4

## 2021-11-16 MED ORDER — INSULIN ASPART 100 UNIT/ML IJ SOLN
0.0000 [IU] | Freq: Three times a day (TID) | INTRAMUSCULAR | Status: DC
  Administered 2021-11-16: 11 [IU] via SUBCUTANEOUS
  Administered 2021-11-17 (×3): 20 [IU] via SUBCUTANEOUS
  Administered 2021-11-18 (×3): 7 [IU] via SUBCUTANEOUS
  Administered 2021-11-18: 15 [IU] via SUBCUTANEOUS
  Administered 2021-11-19: 7 [IU] via SUBCUTANEOUS
  Administered 2021-11-19: 4 [IU] via SUBCUTANEOUS
  Administered 2021-11-19 (×2): 11 [IU] via SUBCUTANEOUS
  Administered 2021-11-20 (×2): 20 [IU] via SUBCUTANEOUS
  Administered 2021-11-20: 15 [IU] via SUBCUTANEOUS
  Administered 2021-11-21: 7 [IU] via SUBCUTANEOUS
  Filled 2021-11-16 (×16): qty 1

## 2021-11-16 MED ORDER — DM-GUAIFENESIN ER 30-600 MG PO TB12: 1.0000 | ORAL_TABLET | Freq: Two times a day (BID) | ORAL | Status: DC | PRN

## 2021-11-16 MED ORDER — INSULIN DETEMIR 100 UNIT/ML ~~LOC~~ SOLN
40.0000 [IU] | Freq: Every day | SUBCUTANEOUS | Status: DC
  Administered 2021-11-16 – 2021-11-17 (×2): 40 [IU] via SUBCUTANEOUS
  Filled 2021-11-16 (×2): qty 0.4

## 2021-11-16 MED ORDER — ALBUTEROL SULFATE (2.5 MG/3ML) 0.083% IN NEBU: 3.0000 mL | INHALATION_SOLUTION | RESPIRATORY_TRACT | Status: DC | PRN

## 2021-11-16 MED ORDER — METOPROLOL TARTRATE 25 MG PO TABS
25.0000 mg | ORAL_TABLET | Freq: Two times a day (BID) | ORAL | Status: DC
  Administered 2021-11-16 – 2021-11-18 (×2): 25 mg via ORAL
  Filled 2021-11-16 (×3): qty 1

## 2021-11-16 MED ORDER — ISOSORBIDE MONONITRATE ER 30 MG PO TB24
60.0000 mg | ORAL_TABLET | Freq: Every day | ORAL | Status: DC
  Administered 2021-11-16 – 2021-11-18 (×3): 60 mg via ORAL
  Filled 2021-11-16 (×3): qty 1
  Filled 2021-11-16: qty 2

## 2021-11-16 MED ORDER — DEXTROSE IN LACTATED RINGERS 5 % IV SOLN: INTRAVENOUS | Status: DC

## 2021-11-16 MED ORDER — HYDRALAZINE HCL 20 MG/ML IJ SOLN: 10.0000 mg | INTRAMUSCULAR | Status: DC | PRN

## 2021-11-16 MED ORDER — INSULIN REGULAR(HUMAN) IN NACL 100-0.9 UT/100ML-% IV SOLN: INTRAVENOUS | Status: DC

## 2021-11-16 NOTE — ED Notes (Signed)
Poct pregnancy Negative 

## 2021-11-16 NOTE — ED Notes (Signed)
Fsbs 244 

## 2021-11-16 NOTE — ED Notes (Signed)
Insulin started at 18units per endotool

## 2021-11-16 NOTE — ED Notes (Signed)
Md in with pt for admission

## 2021-11-16 NOTE — Progress Notes (Addendum)
       CROSS COVER NOTE  NAME: KEAJAH MIESNER MRN: [id number] DOB : 1975/01/25    Time of Service   1941 pm  HPI/Events of Note   Patient on insulin drip for [facility].  Nurse contact me as endotool is now advising 0 insulin.  Blood sugars are in 240-300 range, good from over 600.   Assessment and  Interventions   Assessment:  Plan: Insulin drip transition orders placed with resuming her 40 units of levemir plus high dose sliding scale. Cardiac/carb controlled diet Once transitioned can move to progressive unit        Donnie Mesa NP [facility]  0000 11/9  Per nurse report patient was transitioned but dextrose fluids not stopped. BS 475. One time dose 25 units novolog SQ ordered with safety cbg check 0400 ordered Also nurse reports non adherence with dietary recommendation  0515 am nurse reports cbg 272. Patient with VSS continues to remove monitoring equipment and non adherence to consistant carb diet, currently drinking a regular mountain dew Review of labs show elevation in creatinine 1.09 - resume IV fluid small volume IV fluid therapy - NS 50 ml/h  Donnie Mesa NP [facility]

## 2021-11-16 NOTE — ED Notes (Signed)
Pt reports sob and a cough.  Sx for several weeks.  Worse today   Bp elevated.  Former smoker.  No chest pain.   Pt reports feeling anxious.  No n/v/  nsr on monitor.  Pt watching tv.  Pt tearful

## 2021-11-16 NOTE — ED Provider Notes (Signed)
[facility] Provider Note    Event Date/Time   First MD Initiated Contact with Patient 11/16/21 1605     (approximate)   History   Leg Swelling   HPI  Caitlyn Fuller is a 46 y.o. female with polysubstance abuse on Suboxone, diabetes, seizure disorder, morbid obesity, hypertension who comes in with concerns for leg swelling.  Patient reports increasing swelling.  She also reports being on medications for anxiety but has not had them recently as well as chest pain.  Patient reports that she was just admitted back in October though I cannot see this chart.  The last time I see was in September.  She reports that they were giving her Xanax in the hospital they did not discharge her with any but she feels very anxious.  She also reports increasing shortness of breath swelling her legs and chest discomfort.   I reviewed patient's medication list from discharge in September 2023 where she was on Lasix 40  Physical Exam   Triage Vital Signs: ED Triage Vitals  Enc Vitals Group     BP 11/16/21 1550 (!) 221/111     Pulse Rate 11/16/21 1546 (!) 103     Resp 11/16/21 1546 18     Temp 11/16/21 1546 98.4 F (36.9 C)     Temp Source 11/16/21 1546 Oral     SpO2 11/16/21 1546 100 %     Weight --      Height --      Head Circumference --      Peak Flow --      Pain Score 11/16/21 1546 0     Pain Loc --      Pain Edu? --      Excl. in GC? --     Most recent vital signs: Vitals:   11/16/21 1640 11/16/21 1645  BP: (!) 192/104 (!) 205/124  Pulse: 95 98  Resp:  (!) 23  Temp:    SpO2: 99% 98%     General: Awake, no distress.  CV:  Good peripheral perfusion.  Resp:  Normal effort.  Abd:  No distention.  Other:  Patient has 1+ edema bilaterally.  Lungs sound clear to auscultation.  Abdomen is soft and nontender.   ED Results / Procedures / Treatments   Labs (all labs ordered are listed, but only abnormal results are displayed) Labs Reviewed   COMPREHENSIVE METABOLIC PANEL - Abnormal; Notable for the following components:      Result Value   Sodium 131 (*)    Chloride 95 (*)    Glucose, Bld 621 (*)    BUN 22 (*)    Calcium 8.5 (*)    Albumin 3.2 (*)    All other components within normal limits  CBC - Abnormal; Notable for the following components:   WBC 11.4 (*)    RBC 5.22 (*)    MCH 25.7 (*)    All other components within normal limits  BRAIN NATRIURETIC PEPTIDE - Abnormal; Notable for the following components:   B Natriuretic Peptide 132.7 (*)    All other components within normal limits  TROPONIN I (HIGH SENSITIVITY) - Abnormal; Notable for the following components:   Troponin I (High Sensitivity) 87 (*)    All other components within normal limits  TSH  T4, FREE  MAGNESIUM     EKG  My interpretation of EKG:  Normal sinus rhythm 95 without any ST elevation or T wave inversions, normal intervals  RADIOLOGY I have reviewed the xray personally and interpreted no evidence of any effusion   PROCEDURES:  Critical Care performed: No  .1-3 Lead EKG Interpretation  Performed by: Concha Se, MD Authorized by: Concha Se, MD     Interpretation: normal     ECG rate:  90   ECG rate assessment: normal     Rhythm: sinus rhythm     Ectopy: none     Conduction: normal      MEDICATIONS ORDERED IN ED: Medications - No data to display   IMPRESSION / MDM / ASSESSMENT AND PLAN / ED COURSE  I reviewed the triage vital signs and the nursing notes.   Patient's presentation is most consistent with acute presentation with potential threat to life or bodily function.   Differential as I suspect CHF given patient has edema in her legs.  Her BNP and troponin are elevated which could be demand from CHF but this is higher than her troponins ever been.  We will load her with aspirin.  We will hold off on heparin.  I discussed CT imaging to rule out dissection given she did report some chest pain with her elevated  blood pressure but her chest x-ray without evidence of widened mediastinum and she reports that she no longer has any chest pain so is declining CT imaging.  She reports her biggest concern is the shortness of breath with laying flat.  We discussed risk factors for PE she denies any risk factors and again declines CT imaging to rule out pulmonary embolism and I suspect that it is lower suspicion given the edema noted on her legs.  CMP shows elevated glucose of 621 with normal anion gap CBC shows elevated white count but downtrending from prior Troponin is elevated at 87 up from her baseline BNP is elevated to 132   Given her elevated sugar and I am unable to give fluids due to concern for CHF and hypertensive urgency we will start patient on a Lasix drip.  Will discuss hospital team for admission to the above   The patient is on the cardiac monitor to evaluate for evidence of arrhythmia and/or significant heart rate changes.      FINAL CLINICAL IMPRESSION(S) / ED DIAGNOSES   Final diagnoses:  Acute congestive heart failure, unspecified heart failure type (HCC)  Hyperglycemia  Uncontrolled hypertension     Rx / DC Orders   ED Discharge Orders     None        Note:  This document was prepared using Dragon voice recognition software and may include unintentional dictation errors.   Concha Se, MD 11/16/21 1755

## 2021-11-16 NOTE — H&P (Signed)
History and Physical    CAPRIA CARTAYA KVQ:[id number] DOB: 1975-05-15 DOA: 11/16/2021  Referring MD/NP/PA:   PCP: [facility]   Patient coming from:  The patient is coming from home.  At baseline, pt is independent for most of ADL.        Chief Complaint: Shortness of breath, leg edema,  HPI: Caitlyn Fuller is a 46 y.o. female with medical history significant of diastolic CHF, hypertension, hyperlipidemia, diabetes mellitus, morbid obesity with BMI of 44.9, asthma, polysubstance abuse, tobacco abuse, on Suboxone, ICH, seizure, who presents with shortness of breath, leg edema.  Patient states that she has shortness of breath for about 2 weeks, which has been progressively worsening.  She has mild dry cough. Patient reported chest pain to ED physician, but denies chest pain to me.  Patient does not have a fever or chills.  Patient states that she has worsening bilateral leg edema.  Denies nausea, vomiting, diarrhea or abdominal pain.  No symptoms of UTI.  Patient states that she is very anxious, she has possible panic attack.  She was found to have a burn through 6-21.  She states that she is supposed to take 40 units of Levemir every morning, but did not take it this morning.  Data reviewed independently and ED Course: pt was found to have blood sugar 621, normal anion gap, WBC 11.4, troponin level 87, BNP 32.7, pending COVID PCR, GFR> 60, temperature normal, blood pressure 2 5/124, heart rate 103, 98, RR 23, oxygen saturation 98% on room air.  Chest x-ray negative.  Patient is admitted to stepdown as inpatient.   EKG: I have personally reviewed.  Sinus rhythm, QTc 447, LAE, LAD, poor R wave progression   Review of Systems:   General: no fevers, chills, no body weight gain, has poor appetite, has fatigue HEENT: no blurry vision, hearing changes or sore throat Respiratory: has dyspnea, coughing, no wheezing CV: no chest pain, no palpitations GI: no nausea,  vomiting, abdominal pain, diarrhea, constipation GU: no dysuria, burning on urination, increased urinary frequency, hematuria  Ext: has leg edema Neuro: no unilateral weakness, numbness, or tingling, no vision change or hearing loss Skin: no rash, no skin tear. MSK: No muscle spasm, no deformity, no limitation of range of movement in spin Heme: No easy bruising.  Travel history: No recent long distant travel.   Allergy:  Allergies  Allergen Reactions   Amlodipine Swelling   Gabapentin Anaphylaxis   Morphine And Related Anaphylaxis   Darvocet [Propoxyphene N-Acetaminophen] Hives   Toradol [Ketorolac Tromethamine] Other (See Comments)    migraines   Ultram [Tramadol] Hives   Vancomycin Itching    Past Medical History:  Diagnosis Date   Asthma    Diabetes mellitus without complication (HCC)    Hernia    Hypertension    Kidney infection    Shingles     Past Surgical History:  Procedure Laterality Date   ABDOMINAL SURGERY     CESAREAN SECTION     CHOLECYSTECTOMY     colonscopy     TUBAL LIGATION      Social History:  reports that she has been smoking cigarettes. She has been smoking an average of .5 packs per day. She has never used smokeless tobacco. She reports current drug use. Drug: "Crack" cocaine. She reports that she does not drink alcohol.  Family History:  Family History  Problem Relation Age of Onset   Cancer Mother    Heart failure Father  Prior to Admission medications   Medication Sig Start Date End Date Taking? Authorizing Provider  losartan (COZAAR) 100 MG tablet Take 100 mg by mouth daily. 11/03/21  Yes [provider]  aspirin EC 81 MG EC tablet Take 1 tablet (81 mg total) by mouth daily. Swallow whole. 04/15/21   Carlyn Reichert, MD  atorvastatin (LIPITOR) 10 MG tablet Take 1 tablet (10 mg total) by mouth daily. 10/01/21   Hollice Espy, MD  buprenorphine-naloxone (SUBOXONE) 8-2 mg SUBL SL tablet Place under the tongue. 09/22/21    [provider]  furosemide (LASIX) 40 MG tablet Take 1 tablet (40 mg total) by mouth daily. 09/30/21 09/30/22  Hollice Espy, MD  hydrALAZINE (APRESOLINE) 100 MG tablet Take 1 tablet (100 mg total) by mouth every 8 (eight) hours. 09/30/21   Hollice Espy, MD  insulin detemir (LEVEMIR) 100 UNIT/ML FlexPen Inject 40 Units into the skin daily. 10/01/21   Hollice Espy, MD  isosorbide mononitrate (IMDUR) 60 MG 24 hr tablet Take 1 tablet (60 mg total) by mouth daily. 10/01/21   Hollice Espy, MD  metoprolol tartrate (LOPRESSOR) 25 MG tablet Take 1 tablet (25 mg total) by mouth 2 (two) times daily. 09/30/21   Hollice Espy, MD    Physical Exam: Vitals:   11/16/21 1546 11/16/21 1550 11/16/21 1640 11/16/21 1645  BP:  (!) 221/111 (!) 192/104 (!) 205/124  Pulse: (!) 103  95 98  Resp: 18   (!) 23  Temp: 98.4 F (36.9 C)     TempSrc: Oral     SpO2: 100%  99% 98%   General: Not in acute distress HEENT:       Eyes: PERRL, EOMI, no scleral icterus.       ENT: No discharge from the ears and nose, no pharynx injection, no tonsillar enlargement.        Neck: No JVD, no bruit, no mass felt. Heme: No neck lymph node enlargement. Cardiac: S1/S2, RRR, No murmurs, No gallops or rubs. Respiratory: No rales, wheezing, rhonchi or rubs. GI: Soft, nondistended, nontender, no rebound pain, no organomegaly, BS present. GU: No hematuria Ext: 1+ pitting leg edema bilaterally. 1+DP/PT pulse bilaterally. Musculoskeletal: No joint deformities, No joint redness or warmth, no limitation of ROM in spin. Skin: No rashes.  Neuro: Alert, oriented X3, cranial nerves II-XII grossly intact, moves all extremities normally.  Psych: Patient is not psychotic, no suicidal or hemocidal ideation.  Labs on Admission: I have personally reviewed following labs and imaging studies  CBC: Recent Labs  Lab 11/16/21 1552  WBC 11.4*  HGB 13.4  HCT 42.4  MCV 81.2  PLT 256   Basic Metabolic  Panel: Recent Labs  Lab 11/16/21 1552  NA 131*  K 4.0  CL 95*  CO2 27  GLUCOSE 621*  BUN 22*  CREATININE 0.94  CALCIUM 8.5*  MG 1.8   GFR: CrCl cannot be calculated (Unknown ideal weight.). Liver Function Tests: Recent Labs  Lab 11/16/21 1552  AST 18  ALT 17  ALKPHOS 80  BILITOT 0.5  PROT 6.8  ALBUMIN 3.2*   No results for input(s): "LIPASE", "AMYLASE" in the last 168 hours. No results for input(s): "AMMONIA" in the last 168 hours. Coagulation Profile: No results for input(s): "INR", "PROTIME" in the last 168 hours. Cardiac Enzymes: No results for input(s): "CKTOTAL", "CKMB", "CKMBINDEX", "TROPONINI" in the last 168 hours. BNP (last 3 results) No results for input(s): "PROBNP" in the last 8760 hours. HbA1C: No results  for input(s): "HGBA1C" in the last 72 hours. CBG: No results for input(s): "GLUCAP" in the last 168 hours. Lipid Profile: No results for input(s): "CHOL", "HDL", "LDLCALC", "TRIG", "CHOLHDL", "LDLDIRECT" in the last 72 hours. Thyroid Function Tests: Recent Labs    11/16/21 1552  TSH 1.120  FREET4 0.98   Anemia Panel: No results for input(s): "VITAMINB12", "FOLATE", "FERRITIN", "TIBC", "IRON", "RETICCTPCT" in the last 72 hours. Urine analysis:    Component Value Date/Time   COLORURINE YELLOW (A) 09/27/2021 1120   APPEARANCEUR HAZY (A) 09/27/2021 1120   APPEARANCEUR Clear 08/01/2013 1655   LABSPEC 1.016 09/27/2021 1120   LABSPEC 1.026 08/01/2013 1655   PHURINE 5.0 09/27/2021 1120   GLUCOSEU >=500 (A) 09/27/2021 1120   GLUCOSEU >=500 08/01/2013 1655   HGBUR SMALL (A) 09/27/2021 1120   BILIRUBINUR NEGATIVE 09/27/2021 1120   BILIRUBINUR Negative 08/01/2013 1655   KETONESUR NEGATIVE 09/27/2021 1120   PROTEINUR NEGATIVE 09/27/2021 1120   UROBILINOGEN 0.2 11/01/2014 2153   NITRITE NEGATIVE 09/27/2021 1120   LEUKOCYTESUR NEGATIVE 09/27/2021 1120   LEUKOCYTESUR Negative 08/01/2013 1655   Sepsis  Labs: @LABRCNTIP (procalcitonin:4,lacticidven:4) ) Recent Results (from the past 240 hour(s))  SARS Coronavirus 2 by RT PCR (hospital order, performed in [facility] hospital lab) *cepheid single result test* Anterior Nasal Swab     Status: None   Collection Time: 11/16/21  5:13 PM   Specimen: Anterior Nasal Swab  Result Value Ref Range Status   SARS Coronavirus 2 by RT PCR NEGATIVE NEGATIVE Final    Comment: (NOTE) SARS-CoV-2 target nucleic acids are NOT DETECTED.  The SARS-CoV-2 RNA is generally detectable in upper and lower respiratory specimens during the acute phase of infection. The lowest concentration of SARS-CoV-2 viral copies this assay can detect is 250 copies / mL. A negative result does not preclude SARS-CoV-2 infection and should not be used as the sole basis for treatment or other patient management decisions.  A negative result may occur with improper specimen collection / handling, submission of specimen other than nasopharyngeal swab, presence of viral mutation(s) within the areas targeted by this assay, and inadequate number of viral copies (<250 copies / mL). A negative result must be combined with clinical observations, patient history, and epidemiological information.  Fact Sheet for Patients:   13/08/23  Fact Sheet for Healthcare Providers: RoadLapTop.co.za  This test is not yet approved or  cleared by the http://kim-miller.com/ FDA and has been authorized for detection and/or diagnosis of SARS-CoV-2 by FDA under an Emergency Use Authorization (EUA).  This EUA will remain in effect (meaning this test can be used) for the duration of the COVID-19 declaration under Section 564(b)(1) of the Act, 21 U.S.C. section 360bbb-3(b)(1), unless the authorization is terminated or revoked sooner.  Performed at [facility], [address] [place], [place]      Radiological Exams on Admission: DG  Chest 2 View  Result Date: 11/16/2021 CLINICAL DATA:  Shortness of breath EXAM: CHEST - 2 VIEW COMPARISON:  Chest x-ray 09/25/2021 FINDINGS: The heart size and mediastinal contours are within normal limits. Both lungs are clear. The visualized skeletal structures are unremarkable. IMPRESSION: No active cardiopulmonary disease. Electronically Signed   By: 09/27/2021 M.D.   On: 11/16/2021 17:14      Assessment/Plan Principal Problem:   Hyperosmolar hyperglycemic state (HHS) (HCC) Active Problems:   Hypertensive emergency   Chronic diastolic CHF (congestive heart failure) (HCC)   Uncontrolled type 2 diabetes mellitus with hyperglycemia, with long-term current use of  insulin (HCC)   Polysubstance abuse (HCC)   Tobacco abuse   Anxiety   Myocardial injury   Hyperlipidemia   Obesity, Class III, BMI 40-49.9 (morbid obesity) (HCC)   Assessment and Plan:  Hyperosmolar hyperglycemic state (HHS) (HCC): blood sugar 621, with normal anion gap.  Mental status normal.  This is most likely due to medication noncompliance.  Patient did not take Levemir this morning.  - Admit to stepdown as inpt - IVF:  will not give IVF bolus since pt has fluid overload - start insulin drip - BMP q4h - IVF: LR at 50 cc/h, will switch to D5-LR at 50cc/h when CBG<250 - replete K as needed - Zofran prn nausea  - NPO  - consult to diabetic educator  Hypertensive emergency: Blood pressure 205/124.  Patient has elevated troponin -IV hydralazine as needed -Continue home oral hydralazine, Cozaar, metoprolol, imdur  Chronic diastolic CHF (congestive heart failure) (HCC): 2D echo on 04/12/2021 showed EF of 65-70% with grade 1 diastolic dysfunction.  Patient has shortness of breath, 1+ leg edema, mildly elevated BNP 132, but no oxygen desaturation, no pulm edema by chest x-ray, patient may have mild fluid overload, but does not have obvious CHF exacerbation. -Patient was given 40 mg Lasix in ED. Will hold off further  diuretics due to HHS. -When blood sugar is controlled, will need to start IV diuretics  Uncontrolled type 2 diabetes mellitus with hyperglycemia, with long-term current use of insulin (HCC): Recent A1c 11.8, poorly controlled.  Patient still taking Levemir 40 units daily at home -On insulin drip now  Polysubstance abuse and tobacco abuse: -Did counseling about importance of quitting substance use -Nicotine patch -Check UDS -Continue home Suboxone -Nicotine patch  Anxiety: Patient is very anxious during interview. -Give 1 mg of Ativan -Start as needed hydroxyzine  Myocardial injury: Troponin level 132.  She denies chest pain to me.  Likely due to multifactorial etiology, including fluid overload, HHS, hypertensive emergency -Trend troponin -Will not give aspirin due to recent history of ICH -Lipitor -Check A1c, FLP, check UDS.  Hyperlipidemia -Lipitor  Obesity, Class III, BMI 40-49.9 (morbid obesity) (HCC): BMI= 44.9 and BW= 97.5kg -Diet and exercise.   -Encourage to lose weight.      DVT ppx: SCD  Code Status: Full code  Family Communication:     Yes, patient's daughter  by phone  Disposition Plan:  Anticipate discharge back to previous environment  Consults called:  none  Admission status and Level of care: Stepdown:   as inpt        Dispo: The patient is from: Home              Anticipated d/c is to: Home              Anticipated d/c date is: 2 days              Patient currently is not medically stable to d/c.    Severity of Illness:  The appropriate patient status for this patient is INPATIENT. Inpatient status is judged to be reasonable and necessary in order to provide the required intensity of service to ensure the patient's safety. The patient's presenting symptoms, physical exam findings, and initial radiographic and laboratory data in the context of their chronic comorbidities is felt to place them at high risk for further clinical deterioration.  Furthermore, it is not anticipated that the patient will be medically stable for discharge from the hospital within 2 midnights of admission.   *  I certify that at the point of admission it is my clinical judgment that the patient will require inpatient hospital care spanning beyond 2 midnights from the point of admission due to high intensity of service, high risk for further deterioration and high frequency of surveillance required.*       Date of Service 11/16/2021    Lorretta Harp [facility]   If 7PM-7AM, please contact night-coverage [email or web address] 11/16/2021, 6:48 PM

## 2021-11-16 NOTE — ED Notes (Signed)
Pt up to bathroom  insulin off.  Family with pt.  Pt waiting on admission bed.

## 2021-11-16 NOTE — ED Notes (Signed)
2 iv started on pt

## 2021-11-16 NOTE — ED Triage Notes (Addendum)
Pt reports she was recently admitted to the hospital and since she has been home has had increasing fluid she feels in her abdomen and her ankles.  She states she feels that she "can't breathe" and that she may be having a panic attack.  Pt states she is supposed to be on meds for anxiety.  She states she has not been on anxiety meds since she has been home but has been compliant with her suboxone.  Pt does report she is out her BP meds  Pt does report new onset CP and left arm pain during triage.

## 2021-11-16 NOTE — ED Notes (Signed)
Fsbs 258

## 2021-11-17 ENCOUNTER — Other Ambulatory Visit: Payer: Self-pay

## 2021-11-17 DIAGNOSIS — G894 Chronic pain syndrome: Secondary | ICD-10-CM

## 2021-11-17 DIAGNOSIS — I161 Hypertensive emergency: Secondary | ICD-10-CM

## 2021-11-17 DIAGNOSIS — K219 Gastro-esophageal reflux disease without esophagitis: Secondary | ICD-10-CM

## 2021-11-17 DIAGNOSIS — E871 Hypo-osmolality and hyponatremia: Secondary | ICD-10-CM

## 2021-11-17 DIAGNOSIS — G8929 Other chronic pain: Secondary | ICD-10-CM | POA: Insufficient documentation

## 2021-11-17 LAB — BASIC METABOLIC PANEL
Anion gap: 8 (ref 5–15)
BUN: 27 mg/dL — ABNORMAL HIGH (ref 6–20)
CO2: 30 mmol/L (ref 22–32)
Calcium: 8.7 mg/dL — ABNORMAL LOW (ref 8.9–10.3)
Chloride: 95 mmol/L — ABNORMAL LOW (ref 98–111)
Creatinine, Ser: 1.09 mg/dL — ABNORMAL HIGH (ref 0.44–1.00)
GFR, Estimated: >60 mL/min (ref >60)
Glucose, Bld: 270 mg/dL — ABNORMAL HIGH (ref 70–99)
Potassium: 3.5 mmol/L (ref 3.5–5.1)
Sodium: 133 mmol/L — ABNORMAL LOW (ref 135–145)

## 2021-11-17 LAB — GLUCOSE, RANDOM: Glucose, Bld: 453 mg/dL — ABNORMAL HIGH (ref 70–99)

## 2021-11-17 LAB — CBC WITH DIFFERENTIAL/PLATELET
Abs Immature Granulocytes: 0.02 10*3/uL (ref 0.00–0.07)
Basophils Absolute: 0.1 10*3/uL (ref 0.0–0.1)
Basophils Relative: 1 %
Eosinophils Absolute: 0.4 10*3/uL (ref 0.0–0.5)
Eosinophils Relative: 4 %
HCT: 40.6 % (ref 36.0–46.0)
Hemoglobin: 12.7 g/dL (ref 12.0–15.0)
Immature Granulocytes: 0 %
Lymphocytes Relative: 25 %
Lymphs Abs: 2.7 10*3/uL (ref 0.7–4.0)
MCH: 25.3 pg — ABNORMAL LOW (ref 26.0–34.0)
MCHC: 31.3 g/dL (ref 30.0–36.0)
MCV: 81 fL (ref 80.0–100.0)
Monocytes Absolute: 0.7 10*3/uL (ref 0.1–1.0)
Monocytes Relative: 7 %
Neutro Abs: 6.8 10*3/uL (ref 1.7–7.7)
Neutrophils Relative %: 63 %
Platelets: 240 10*3/uL (ref 150–400)
RBC: 5.01 MIL/uL (ref 3.87–5.11)
RDW: 13.4 % (ref 11.5–15.5)
WBC: 10.7 10*3/uL — ABNORMAL HIGH (ref 4.0–10.5)
nRBC: 0 % (ref 0.0–0.2)

## 2021-11-17 LAB — LIPID PANEL
Cholesterol: 142 mg/dL (ref 0–200)
HDL: 48 mg/dL (ref >40)
LDL Cholesterol: 43 mg/dL (ref 0–99)
Total CHOL/HDL Ratio: 3 RATIO
Triglycerides: 253 mg/dL — ABNORMAL HIGH (ref <150)
VLDL: 51 mg/dL — ABNORMAL HIGH (ref 0–40)

## 2021-11-17 LAB — CBG MONITORING, ED
Glucose-Capillary: 272 mg/dL — ABNORMAL HIGH (ref 70–99)
Glucose-Capillary: 368 mg/dL — ABNORMAL HIGH (ref 70–99)
Glucose-Capillary: 332 mg/dL — ABNORMAL HIGH (ref 70–99)
Glucose-Capillary: 366 mg/dL — ABNORMAL HIGH (ref 70–99)

## 2021-11-17 LAB — HEMOGLOBIN A1C
Hgb A1c MFr Bld: 12.5 % — ABNORMAL HIGH (ref 4.8–5.6)
Mean Plasma Glucose: 312.05 mg/dL

## 2021-11-17 LAB — GLUCOSE, CAPILLARY: Glucose-Capillary: 418 mg/dL — ABNORMAL HIGH (ref 70–99)

## 2021-11-17 MED ORDER — SENNOSIDES-DOCUSATE SODIUM 8.6-50 MG PO TABS
2.0000 | ORAL_TABLET | Freq: Once | ORAL | Status: AC
  Administered 2021-11-17: 2 via ORAL
  Filled 2021-11-17: qty 2

## 2021-11-17 MED ORDER — POTASSIUM CHLORIDE CRYS ER 20 MEQ PO TBCR
20.0000 meq | EXTENDED_RELEASE_TABLET | Freq: Two times a day (BID) | ORAL | Status: DC
  Administered 2021-11-17 – 2021-11-21 (×7): 20 meq via ORAL
  Filled 2021-11-17 (×8): qty 1

## 2021-11-17 MED ORDER — POTASSIUM CHLORIDE CRYS ER 20 MEQ PO TBCR
40.0000 meq | EXTENDED_RELEASE_TABLET | Freq: Once | ORAL | Status: AC
  Administered 2021-11-17: 40 meq via ORAL
  Filled 2021-11-17: qty 2

## 2021-11-17 MED ORDER — INSULIN ASPART 100 UNIT/ML IJ SOLN
25.0000 [IU] | Freq: Once | INTRAMUSCULAR | Status: AC
  Administered 2021-11-17: 25 [IU] via SUBCUTANEOUS
  Filled 2021-11-17: qty 1

## 2021-11-17 MED ORDER — FUROSEMIDE 10 MG/ML IJ SOLN
40.0000 mg | Freq: Once | INTRAMUSCULAR | Status: AC
  Administered 2021-11-17: 40 mg via INTRAVENOUS
  Filled 2021-11-17: qty 4

## 2021-11-17 MED ORDER — OXYCODONE-ACETAMINOPHEN 5-325 MG PO TABS
2.0000 | ORAL_TABLET | Freq: Four times a day (QID) | ORAL | Status: DC | PRN
  Administered 2021-11-17 – 2021-11-19 (×5): 2 via ORAL
  Filled 2021-11-17 (×7): qty 2

## 2021-11-17 MED ORDER — HYDROXYZINE HCL 25 MG PO TABS: 25.0000 mg | ORAL_TABLET | Freq: Three times a day (TID) | ORAL | Status: DC | PRN

## 2021-11-17 MED ORDER — FAMOTIDINE 20 MG PO TABS
20.0000 mg | ORAL_TABLET | Freq: Two times a day (BID) | ORAL | Status: DC
  Administered 2021-11-17 – 2021-11-22 (×11): 20 mg via ORAL
  Filled 2021-11-17 (×12): qty 1

## 2021-11-17 MED ORDER — INSULIN ASPART 100 UNIT/ML IJ SOLN: 30.0000 [IU] | Freq: Once | INTRAMUSCULAR | Status: DC

## 2021-11-17 MED ORDER — LORAZEPAM 2 MG/ML IJ SOLN: 0.5000 mg | Freq: Once | INTRAMUSCULAR | Status: DC | PRN

## 2021-11-17 MED ORDER — ESCITALOPRAM OXALATE 10 MG PO TABS
10.0000 mg | ORAL_TABLET | Freq: Every day | ORAL | Status: DC
  Administered 2021-11-18: 10 mg via ORAL
  Filled 2021-11-17 (×3): qty 1

## 2021-11-17 MED ORDER — FUROSEMIDE 10 MG/ML IJ SOLN
40.0000 mg | Freq: Two times a day (BID) | INTRAMUSCULAR | Status: DC
  Administered 2021-11-17 – 2021-11-19 (×4): 40 mg via INTRAVENOUS
  Filled 2021-11-17 (×4): qty 4

## 2021-11-17 MED ORDER — BUPRENORPHINE HCL 8 MG SL SUBL
8.0000 mg | SUBLINGUAL_TABLET | Freq: Every day | SUBLINGUAL | Status: DC
  Administered 2021-11-17 – 2021-11-21 (×5): 8 mg via SUBLINGUAL
  Filled 2021-11-17 (×5): qty 1

## 2021-11-17 MED ORDER — SODIUM CHLORIDE 0.9 % IV SOLN: INTRAVENOUS | Status: DC

## 2021-11-17 MED ORDER — INSULIN ASPART 100 UNIT/ML IJ SOLN
5.0000 [IU] | Freq: Three times a day (TID) | INTRAMUSCULAR | Status: DC
  Administered 2021-11-17 – 2021-11-18 (×3): 5 [IU] via SUBCUTANEOUS
  Filled 2021-11-17 (×3): qty 1

## 2021-11-17 NOTE — Assessment & Plan Note (Addendum)
BMI 49.53 with current height and weight in computer.

## 2021-11-17 NOTE — ED Notes (Signed)
Pt refused morning cozaar lopressor as she remembers having side effects from her blood pressure medicine. MD aware. RN to monitor BP.

## 2021-11-17 NOTE — ED Notes (Signed)
Informed RN bed assigned 

## 2021-11-17 NOTE — Assessment & Plan Note (Addendum)
Patient refused MRI of the lumbar spine.  I do not see any previous record of MRI.  Prescribed oxycodone 15 mg every 6 hours as needed for pain only a few days supply.  Advised that if she goes back on her Suboxone she has to wait for least 8 hours after the last oxycodone dose.

## 2021-11-17 NOTE — Inpatient Diabetes Management (Signed)
Inpatient Diabetes Program Recommendations  AACE/ADA: New Consensus Statement on Inpatient Glycemic Control (2015)  Target Ranges:  Prepandial:   less than 140 mg/dL      Peak postprandial:   less than 180 mg/dL (1-2 hours)      Critically ill patients:  140 - 180 mg/dL   Lab Results  Component Value Date   GLUCAP 368 (H) 11/17/2021   HGBA1C 11.8 (H) 09/26/2021    Latest Reference Range & Units 09/30/21 05:09 09/30/21 08:11 09/30/21 11:36 11/16/21 19:16 11/16/21 19:19 11/16/21 20:58 11/16/21 23:35 11/17/21 05:07 11/17/21 07:59  Glucose-Capillary 70 - 99 mg/dL 409 (H) 811 (H) 914 (H) 244 (H) 254 (H) 258 (H) 475 (H) 272 (H) 368 (H)  (H): Data is abnormally high  Diabetes history: DM2 Outpatient Diabetes medications: Levemir 40 units qd Current orders for Inpatient glycemic control: Levemir 40 units q hs, Novolog correction 0-20 units qid  Inpatient Diabetes Program Recommendations:   Patient well known to DM coordinators from past admissions with discussions regarding medications ($0 copay), elevated A1c and nutrition education on 09/27/21 and 09/29/17. Patient had been on steroids in the past also. Please consider adding: -Novolog 5 units tid meal coverage if eats 50% or more of meal  Thank you, Darel Hong E. Tiesha Marich, RN, MSN, CDE  Diabetes Coordinator Inpatient Glycemic Control Team Team Pager [phone] (8am-5pm) 11/17/2021 9:57 AM

## 2021-11-17 NOTE — Assessment & Plan Note (Signed)
Start Pepcid 

## 2021-11-17 NOTE — Assessment & Plan Note (Addendum)
With weight gain from yesterday today increase Lasix to 60 mg IV twice daily.  One dose of Zaroxolyn.  Continue daily weights.  Trial of low-dose Toprol.

## 2021-11-17 NOTE — Consult Note (Signed)
   Heart Failure Nurse Navigator Note  HFpEF 65 to 70%.  Grade 1 diastolic dysfunction.  Presented to the emergency room with a 2-week history of worsening shortness of breath and lower extremity edema.  BNP was 132.  Chest x-ray revealed clear lungs.  Comorbidities:  Attention Hyperlipidemia Diabetes Morbid obesity Asthma Polysubstance abuse Tobacco abuse  Medications:  Atorvastatin 10 mg daily Furosemide 40 mg IV twice a day Isosorbide mononitrate 60 mg daily Losartan 100 mg daily Metoprolol tartrate 25 mg twice a day NicoDerm patch Potassium chloride 20 mEq twice a day   Labs:  Sodium 133, potassium 3.5, chloride 95, CO2 30, BUN 27, creatinine 1.09, hemoglobin A1c 11.8, blood sugar 621, total cholesterol 142, triglycerides 253, HDL 48, LDL 43.  Urine was positive for cocaine. BMI is 44.9 Blood pressure 149/85  Initial meeting with patient in the ED, she was laying on the gurney in no acute distress.  She states that she has heard the term heart failure before but not in relationship to herself.  Discussed what heart failure means.  She states that she does not add salt to her food and does not eat in restaurants very often.  Discussed eating healthy, foods in their natural state and avoiding processed foods.  Also discussed fluid restriction and she did not feel that she goes over 2 L a day.  Patient does not have a scale, discussed the importance of daily weights and what to report.  At this time she is drifting off to sleep, I told her that I would come back at another time, left her the living with heart failure teaching booklet, zone magnet, info on low-sodium and heart failure along with weight chart.   She has follow-up in the outpatient heart failure clinic on November 21 at 3 PM.  Continue to follow.  Kema Santaella RN [facility]

## 2021-11-17 NOTE — Progress Notes (Signed)
  Progress Note   Patient: Caitlyn Fuller QMG:[id number] DOB: 07-17-1975 DOA: 11/16/2021     1 DOS: the patient was seen and examined on 11/17/2021     Assessment and Plan: * Acute on chronic diastolic CHF (congestive heart failure) (HCC) We will switch back to IV Lasix.  Continue metoprolol.  Hyperosmolar hyperglycemic state (HHS) (HCC) Patient on Levemir at night and NovoLog 5 units prior to meals.  Last for sugars 475, 272, 368 at 332.  Hypertensive emergency Last blood pressure 149/85.  Currently on Cozaar, metoprolol and Lasix.  Myocardial injury Elevated troponin likely demand ischemia from CHF.  Hyponatremia Sodium 2 points less than the normal range.  Continue to monitor.  Obesity, Class III, BMI 40-49.9 (morbid obesity) (HCC) BMI 43.41  GERD (gastroesophageal reflux disease) Start Pepcid  Chronic pain Patient refused MRI of the lumbar spine.  I do not see any previous record of MRI.  Patient wanted Percocet.  She also takes Suboxone.  We will get rid of The naltrexone part of the Suboxone.        Subjective: Patient with numerous complaints.  She complained of chest pain and shortness of breath upon coming into the hospital and leg edema.  Also complains of severe back pain and unable to move her legs very well.  Patient refused MRI.  Asking for Percocet.  Also had uncontrolled sugars upon coming in.  Physical Exam: Vitals:   11/17/21 0801 11/17/21 0934 11/17/21 1200 11/17/21 1205  BP: (!) 164/86 (!) 142/89 (!) 149/85   Pulse:   89   Resp:   18   Temp:    98.5 F (36.9 C)  TempSrc:    Oral  SpO2:   100%    Physical Exam HENT:     Head: Normocephalic.     Mouth/Throat:     Pharynx: No oropharyngeal exudate.  Eyes:     General: Lids are normal.     Conjunctiva/sclera: Conjunctivae normal.  Cardiovascular:     Rate and Rhythm: Normal rate and regular rhythm.     Heart sounds: Normal heart sounds, S1 normal and S2 normal.  Pulmonary:     Breath  sounds: Examination of the right-lower field reveals decreased breath sounds and rales. Examination of the left-lower field reveals decreased breath sounds and rales. Decreased breath sounds and rales present. No wheezing or rhonchi.  Abdominal:     Palpations: Abdomen is soft.     Tenderness: There is no abdominal tenderness.  Musculoskeletal:     Right lower leg: Swelling present.     Left lower leg: Swelling present.  Skin:    General: Skin is warm.     Findings: No rash.  Neurological:     Mental Status: She is alert and oriented to person, place, and time.     Comments: Able to straight leg raise but painful to do so in her lower back.     Data Reviewed: Last echocardiogram showed a EF of 65% Sodium 133, creatinine 1.09, LDL of 43, white blood cell count 10.7, hemoglobin 12.7  Family Communication: Family at bedside  Disposition: Status is: Inpatient Remains inpatient appropriate because: Starting Lasix IV for swelling of the lower extremities and shortness of breath.  Patient refused imaging of lumbar spine.  Planned Discharge Destination: Home    Time spent: 28 minutes  Author: Alford Highland, MD 11/17/2021 2:22 PM  For on call review [email or web address].

## 2021-11-17 NOTE — Assessment & Plan Note (Signed)
Elevated troponin likely demand ischemia from CHF.

## 2021-11-17 NOTE — Assessment & Plan Note (Addendum)
On admission likely secondary to pain.  Patient evaluated by psychiatry and Toprol-XL now.

## 2021-11-17 NOTE — ED Notes (Signed)
Pt noted to be ambulatory to restroom with assistance of support person multiple times since assuming care of pt. Pt disconnects self from all monitoring equip. Educated pt on need and benefit of leaving monitoring in place and using call bell for assistance. Pt voices understanding. Continues to remove monitoring to ambulate to restroom

## 2021-11-17 NOTE — Assessment & Plan Note (Deleted)
Last sodium in normal range at 136.

## 2021-11-17 NOTE — Assessment & Plan Note (Addendum)
On admission.  Now improved.  Increased Levemir to 50 units at night and increase NovoLog 15 units prior to meals.  Last hemoglobin A1c 12.5.

## 2021-11-18 ENCOUNTER — Inpatient Hospital Stay: Payer: Medicaid Other

## 2021-11-18 ENCOUNTER — Encounter: Payer: Self-pay | Admitting: Internal Medicine

## 2021-11-18 DIAGNOSIS — E871 Hypo-osmolality and hyponatremia: Secondary | ICD-10-CM

## 2021-11-18 DIAGNOSIS — F419 Anxiety disorder, unspecified: Secondary | ICD-10-CM

## 2021-11-18 LAB — GLUCOSE, CAPILLARY
Glucose-Capillary: 233 mg/dL — ABNORMAL HIGH (ref 70–99)
Glucose-Capillary: 211 mg/dL — ABNORMAL HIGH (ref 70–99)
Glucose-Capillary: 216 mg/dL — ABNORMAL HIGH (ref 70–99)
Glucose-Capillary: 345 mg/dL — ABNORMAL HIGH (ref 70–99)
Glucose-Capillary: 227 mg/dL — ABNORMAL HIGH (ref 70–99)

## 2021-11-18 LAB — BASIC METABOLIC PANEL
Anion gap: 7 (ref 5–15)
BUN: 37 mg/dL — ABNORMAL HIGH (ref 6–20)
CO2: 28 mmol/L (ref 22–32)
Calcium: 8.7 mg/dL — ABNORMAL LOW (ref 8.9–10.3)
Chloride: 101 mmol/L (ref 98–111)
Creatinine, Ser: 1.25 mg/dL — ABNORMAL HIGH (ref 0.44–1.00)
GFR, Estimated: 54 mL/min — ABNORMAL LOW (ref >60)
Glucose, Bld: 206 mg/dL — ABNORMAL HIGH (ref 70–99)
Potassium: 4 mmol/L (ref 3.5–5.1)
Sodium: 136 mmol/L (ref 135–145)

## 2021-11-18 LAB — PREGNANCY, URINE: Preg Test, Ur: NEGATIVE

## 2021-11-18 MED ORDER — METOPROLOL TARTRATE 25 MG PO TABS: 12.5000 mg | ORAL_TABLET | Freq: Two times a day (BID) | ORAL | Status: DC

## 2021-11-18 MED ORDER — METOPROLOL SUCCINATE ER 25 MG PO TB24
12.5000 mg | ORAL_TABLET | Freq: Every day | ORAL | Status: DC
  Administered 2021-11-19 – 2021-11-20 (×2): 12.5 mg via ORAL
  Filled 2021-11-18 (×2): qty 1

## 2021-11-18 MED ORDER — ENOXAPARIN SODIUM 60 MG/0.6ML IJ SOSY
50.0000 mg | PREFILLED_SYRINGE | INTRAMUSCULAR | Status: DC
  Administered 2021-11-20: 50 mg via SUBCUTANEOUS
  Filled 2021-11-18 (×4): qty 0.6

## 2021-11-18 MED ORDER — INSULIN ASPART 100 UNIT/ML IJ SOLN
10.0000 [IU] | Freq: Three times a day (TID) | INTRAMUSCULAR | Status: DC
  Administered 2021-11-18 – 2021-11-20 (×7): 10 [IU] via SUBCUTANEOUS
  Filled 2021-11-18 (×9): qty 1

## 2021-11-18 MED ORDER — INSULIN DETEMIR 100 UNIT/ML ~~LOC~~ SOLN
45.0000 [IU] | Freq: Every day | SUBCUTANEOUS | Status: DC
  Administered 2021-11-18 – 2021-11-20 (×3): 45 [IU] via SUBCUTANEOUS
  Filled 2021-11-18 (×3): qty 0.45

## 2021-11-18 NOTE — Progress Notes (Signed)
Progress Note   Patient: Caitlyn Fuller JAS:[id number] DOB: 1975/08/23 DOA: 11/16/2021     2 DOS: the patient was seen and examined on 11/18/2021     Assessment and Plan: * Acute on chronic diastolic CHF (congestive heart failure) (HCC) Continue IV Lasix today.  Daily weights.  Change metoprolol to Toprol-XL.  Hyperosmolar hyperglycemic state (HHS) (HCC) On admission.  Now improved.  Increase Levemir to 45 units at night and increase NovoLog 10 units prior to meals.    Hypertensive emergency Blood pressure on the lower side with orthostatics today.  Patient states that she does not take any blood pressure medications at home.  Will continue Lasix and switch metoprolol tartrate to Toprol-XL.  We will hold off on further blood pressure medications  Anxiety Discontinue Lexapro, which could be the cause of her not feeling well.  Continue Atarax for anxiety.  Myocardial injury Elevated troponin likely demand ischemia from CHF.  Hyponatremia Last sodium in normal range at 136.  Obesity, Class III, BMI 40-49.9 (morbid obesity) (HCC) BMI 44.92 with current height and weight in computer.  GERD (gastroesophageal reflux disease) Start Pepcid  Chronic pain Patient refused MRI of the lumbar spine.  I do not see any previous record of MRI.  Patient wanted Percocet.  She also takes Suboxone.  We will get rid of The naltrexone part of the Suboxone.        Subjective: Patient states that something we are giving her is causing her to feel miserable.  Having some dizziness.  States she is more swollen than when she came in.  Came in with CHF exacerbation.  Physical Exam: Vitals:   11/17/21 1959 11/18/21 0319 11/18/21 0734 11/18/21 1533  BP: (!) 170/83 134/87 (!) 152/73 121/69  Pulse: (!) 103  85 75  Resp: 20 14 18 18   Temp: 100.3 F (37.9 C) 98.1 F (36.7 C) 98.2 F (36.8 C) 98.1 F (36.7 C)  TempSrc: Oral Oral Oral Oral  SpO2: 98% 98% 97% 97%   Physical Exam HENT:      Head: Normocephalic.     Mouth/Throat:     Pharynx: No oropharyngeal exudate.  Eyes:     General: Lids are normal.     Conjunctiva/sclera: Conjunctivae normal.  Cardiovascular:     Rate and Rhythm: Normal rate and regular rhythm.     Heart sounds: Normal heart sounds, S1 normal and S2 normal.  Pulmonary:     Breath sounds: Examination of the right-lower field reveals decreased breath sounds and rales. Examination of the left-lower field reveals decreased breath sounds and rales. Decreased breath sounds and rales present. No wheezing or rhonchi.  Abdominal:     Palpations: Abdomen is soft.     Tenderness: There is no abdominal tenderness.  Musculoskeletal:     Right lower leg: Swelling present.     Left lower leg: Swelling present.  Skin:    General: Skin is warm.     Findings: No rash.  Neurological:     Mental Status: She is alert and oriented to person, place, and time.     Comments: Able to straight leg raise but painful to do so in her lower back.     Data Reviewed: Creatinine 1.2  Disposition: Status is: Inpatient Remains inpatient appropriate because: Still short of breath.  Still with worsening swelling.  Continue IV Lasix as long as blood pressure allows.  Planned Discharge Destination: Home    Time spent: 28 minutes  Author: , MD 11/18/2021  3:35 PM  For on call review [email or web address].

## 2021-11-18 NOTE — Plan of Care (Signed)

## 2021-11-18 NOTE — Inpatient Diabetes Management (Signed)
Inpatient Diabetes Program Recommendations  AACE/ADA: New Consensus Statement on Inpatient Glycemic Control (2015)  Target Ranges:  Prepandial:   less than 140 mg/dL      Peak postprandial:   less than 180 mg/dL (1-2 hours)      Critically ill patients:  140 - 180 mg/dL   Lab Results  Component Value Date   GLUCAP 211 (H) 11/18/2021   HGBA1C 12.5 (H) 11/17/2021    Latest Reference Range & Units 11/17/21 05:07 11/17/21 07:59 11/17/21 11:46 11/17/21 17:07 11/17/21 21:11 11/18/21 03:16 11/18/21 08:21  Glucose-Capillary 70 - 99 mg/dL 462 (H) 863 (H) 817 (H) 366 (H) 418 (H) 233 (H) 211 (H)  (H): Data is abnormally high  Diabetes history: DM2 Outpatient Diabetes medications: Levemir 40 units qd Current orders for Inpatient glycemic control: Levemir 40 units q hs, Novolog 5 units tid meal coverage, Novolog correction 0-20 units qid  Inpatient Diabetes Program Recommendations:   Please consider: -Increase Levemir to 45 units qd -Increase Novolog meal coverage to 10 units tid if eats 50% meals  Thank you, Caitlyn Fuller. Nakesha Ebrahim, RN, MSN, CDE  Diabetes Coordinator Inpatient Glycemic Control Team Team Pager [phone] (8am-5pm) 11/18/2021 9:30 AM

## 2021-11-18 NOTE — TOC CM/SW Note (Signed)
Went by room for readmission prevention screen but patient sleeping. Will try again later.  Charlynn Court, CSW [phone]

## 2021-11-18 NOTE — Assessment & Plan Note (Signed)
Discontinued Atarax which could be causing patient's lethargy.

## 2021-11-19 ENCOUNTER — Inpatient Hospital Stay (HOSPITAL_COMMUNITY)
Admit: 2021-11-19 | Discharge: 2021-11-19 | Disposition: A | Discharge status: Home / Self Care | Payer: Medicaid Other | Attending: Internal Medicine | Admitting: Internal Medicine

## 2021-11-19 DIAGNOSIS — I5031 Acute diastolic (congestive) heart failure: Secondary | ICD-10-CM | POA: Diagnosis not present

## 2021-11-19 DIAGNOSIS — R7989 Other specified abnormal findings of blood chemistry: Secondary | ICD-10-CM | POA: Insufficient documentation

## 2021-11-19 LAB — ECHOCARDIOGRAM COMPLETE
AR max vel: 2.32 cm2
AV Peak grad: 8.9 mmHg
Ao pk vel: 1.49 m/s
Area-P 1/2: 4.41 cm2
Height: 58 in
S' Lateral: 3 cm
Single Plane A4C EF: 59.8 %
Weight: 3724.89 oz

## 2021-11-19 LAB — BASIC METABOLIC PANEL
Anion gap: 5 (ref 5–15)
BUN: 44 mg/dL — ABNORMAL HIGH (ref 6–20)
CO2: 30 mmol/L (ref 22–32)
Calcium: 8.9 mg/dL (ref 8.9–10.3)
Chloride: 101 mmol/L (ref 98–111)
Creatinine, Ser: 1.01 mg/dL — ABNORMAL HIGH (ref 0.44–1.00)
GFR, Estimated: >60 mL/min (ref >60)
Glucose, Bld: 235 mg/dL — ABNORMAL HIGH (ref 70–99)
Potassium: 4.5 mmol/L (ref 3.5–5.1)
Sodium: 136 mmol/L (ref 135–145)

## 2021-11-19 LAB — GLUCOSE, CAPILLARY
Glucose-Capillary: 229 mg/dL — ABNORMAL HIGH (ref 70–99)
Glucose-Capillary: 289 mg/dL — ABNORMAL HIGH (ref 70–99)
Glucose-Capillary: 275 mg/dL — ABNORMAL HIGH (ref 70–99)
Glucose-Capillary: 180 mg/dL — ABNORMAL HIGH (ref 70–99)

## 2021-11-19 MED ORDER — OXYCODONE HCL 5 MG PO TABS
15.0000 mg | ORAL_TABLET | ORAL | Status: DC | PRN
  Administered 2021-11-19 – 2021-11-21 (×10): 15 mg via ORAL
  Filled 2021-11-19 (×10): qty 3

## 2021-11-19 MED ORDER — FUROSEMIDE 10 MG/ML IJ SOLN
60.0000 mg | Freq: Two times a day (BID) | INTRAMUSCULAR | Status: DC
  Administered 2021-11-19 – 2021-11-21 (×4): 60 mg via INTRAVENOUS
  Filled 2021-11-19 (×4): qty 8

## 2021-11-19 MED ORDER — BUSPIRONE HCL 10 MG PO TABS: 5.0000 mg | ORAL_TABLET | Freq: Three times a day (TID) | ORAL | Status: DC | PRN

## 2021-11-19 MED ORDER — METOLAZONE 5 MG PO TABS
5.0000 mg | ORAL_TABLET | Freq: Once | ORAL | Status: AC
  Administered 2021-11-19: 5 mg via ORAL
  Filled 2021-11-19: qty 1

## 2021-11-19 NOTE — Progress Notes (Addendum)
4174 - received report 2116 - obtain VS, assess fingerstick blood glucose, administered scheduled medications, administered prn analgesic per physician's orders, provided snacks and beverages per pt request. Pt refuses buspar and TED hose.  2211 - visitor at bedside, pt requesting items to bathe, no other needs at this time 2308 - pt eating meal, visitor remains at bedside, no needs at this time 2340 - corrected telemetry leads, denies needs at this time, declines refill of ice pitcher 0007 - ambulating halls 0206 - pt resting in bed with eyes closed lying right side, respirations even and unlabored, no distress or discomfort noted. Visitor remains at bedside. 0814 - pt resting in bed with eyes closed, respirations even and unlabored, no signs of distress or discomfort, visitor at bedside 0501 - pt awakened to obtain VS

## 2021-11-19 NOTE — Assessment & Plan Note (Signed)
Secondary to elevated sugars upon presentation.  Last sodium normal range.

## 2021-11-19 NOTE — Progress Notes (Signed)
Progress Note   Patient: Caitlyn Fuller:[id number] DOB: [birth year] DOA: 11/16/2021     3 DOS: the patient was seen and examined on 11/19/2021     Assessment and Plan: * Acute on chronic diastolic CHF (congestive heart failure) (HCC) With weight gain from yesterday today increase Lasix to 60 mg IV twice daily.  One dose of Zaroxolyn.  Continue daily weights.  Trial of low-dose Toprol.  Hyperosmolar hyperglycemic state (HHS) (HCC) On admission.  Now improved.  Increased Levemir to 45 units at night and increase NovoLog 10 units prior to meals.    Hypertensive emergency On admission likely secondary to pain.  Patient evaluated by psychiatry and Toprol-XL now.  Anxiety Discontinued Atarax which could be causing lethargy.  Trial of buspirone.  Myocardial injury Elevated troponin likely demand ischemia from CHF.  Obesity, Class III, BMI 40-49.9 (morbid obesity) (HCC) BMI 49.53 with current height and weight in computer.  Pseudohyponatremia Secondary to elevated sugars upon presentation.  Last sodium normal range.  GERD (gastroesophageal reflux disease) Start Pepcid  Chronic pain Patient refused MRI of the lumbar spine.  I do not see any previous record of MRI.  Continue Subutex at night and change Percocet over to oxycodone 15 mg every 4 hours as needed.        Subjective: Patient complains of fatigue and sleeping all the time.  She is urinating okay.  Still feels very swollen.  Breathing a little bit better.  Still having a lot of back pain and significant off pain medications.  Admitted with CHF exacerbation.  Physical Exam: Vitals:   11/19/21 0623 11/19/21 0800 11/19/21 1124 11/19/21 1200  BP: (!) 144/93 (!) 155/88  (!) 153/87  Pulse: 89 76    Resp:  18  18  Temp:  98 F (36.7 C)    TempSrc:  Oral    SpO2:  97%    Weight: 105.6 kg  107.5 kg   Height: 4\' 10"  (1.473 m)      Physical Exam HENT:     Head: Normocephalic.     Mouth/Throat:     Pharynx: No  oropharyngeal exudate.  Eyes:     General: Lids are normal.     Conjunctiva/sclera: Conjunctivae normal.  Cardiovascular:     Rate and Rhythm: Normal rate and regular rhythm.     Heart sounds: Normal heart sounds, S1 normal and S2 normal.  Pulmonary:     Breath sounds: Examination of the right-lower field reveals decreased breath sounds. Examination of the left-lower field reveals decreased breath sounds. Decreased breath sounds present. No wheezing, rhonchi or rales.  Abdominal:     Palpations: Abdomen is soft.     Tenderness: There is no abdominal tenderness.  Musculoskeletal:     Right lower leg: Swelling present.     Left lower leg: Swelling present.  Skin:    General: Skin is warm.     Findings: No rash.  Neurological:     Mental Status: She is alert and oriented to person, place, and time.     Comments: Able to straight leg raise but painful to do so in her lower back.     Data Reviewed: Reviewed lumbar x-ray results EF 60-65 moderate left ventricular hypertrophy Creatinine 1.01 with a BUN of 44.  Disposition: Status is: Inpatient Remains inpatient appropriate because: Still receiving IV Lasix.  Continue weight from yesterday to today.  Planned Discharge Destination: Home    Time spent: 28 minutes  Author: , MD 11/19/2021  1:26 PM  For on call review [email or web address].

## 2021-11-19 NOTE — Progress Notes (Signed)
  Echocardiogram 2D Echocardiogram has been performed.  Lenor Coffin 11/19/2021, 11:22 AM

## 2021-11-19 NOTE — Plan of Care (Signed)
Axox4,RA, independent. Takes meds whole. Pain meds given x2 for generalized aching. TED hose placed today, pt stated that they made her legs feel better. Continent B&B.  Problem: Education: Goal: Ability to describe self-care measures that may prevent or decrease complications (Diabetes Survival Skills Education) will improve Outcome: Progressing Goal: Individualized Educational Video(s) Outcome: Progressing   Problem: Coping: Goal: Ability to adjust to condition or change in health will improve Outcome: Progressing   Problem: Fluid Volume: Goal: Ability to maintain a balanced intake and output will improve Outcome: Progressing   Problem: Health Behavior/Discharge Planning: Goal: Ability to identify and utilize available resources and services will improve Outcome: Progressing Goal: Ability to manage health-related needs will improve Outcome: Progressing   Problem: Metabolic: Goal: Ability to maintain appropriate glucose levels will improve Outcome: Progressing   Problem: Nutritional: Goal: Maintenance of adequate nutrition will improve Outcome: Progressing Goal: Progress toward achieving an optimal weight will improve Outcome: Progressing   Problem: Tissue Perfusion: Goal: Adequacy of tissue perfusion will improve Outcome: Progressing   Problem: Skin Integrity: Goal: Risk for impaired skin integrity will decrease Outcome: Progressing   Problem: Education: Goal: Ability to describe self-care measures that may prevent or decrease complications (Diabetes Survival Skills Education) will improve Outcome: Progressing Goal: Individualized Educational Video(s) Outcome: Progressing   Problem: Cardiac: Goal: Ability to maintain an adequate cardiac output will improve Outcome: Progressing   Problem: Health Behavior/Discharge Planning: Goal: Ability to identify and utilize available resources and services will improve Outcome: Progressing Goal: Ability to manage  health-related needs will improve Outcome: Progressing   Problem: Fluid Volume: Goal: Ability to achieve a balanced intake and output will improve Outcome: Progressing   Problem: Metabolic: Goal: Ability to maintain appropriate glucose levels will improve Outcome: Progressing   Problem: Nutritional: Goal: Maintenance of adequate nutrition will improve Outcome: Progressing Goal: Maintenance of adequate weight for body size and type will improve Outcome: Progressing   Problem: Respiratory: Goal: Will regain and/or maintain adequate ventilation Outcome: Progressing   Problem: Urinary Elimination: Goal: Ability to achieve and maintain adequate renal perfusion and functioning will improve Outcome: Progressing   Problem: Education: Goal: Knowledge of General Education information will improve Description: Including pain rating scale, medication(s)/side effects and non-pharmacologic comfort measures Outcome: Progressing   Problem: Health Behavior/Discharge Planning: Goal: Ability to manage health-related needs will improve Outcome: Progressing   Problem: Clinical Measurements: Goal: Ability to maintain clinical measurements within normal limits will improve Outcome: Progressing Goal: Will remain free from infection Outcome: Progressing Goal: Diagnostic test results will improve Outcome: Progressing Goal: Respiratory complications will improve Outcome: Progressing Goal: Cardiovascular complication will be avoided Outcome: Progressing   Problem: Activity: Goal: Risk for activity intolerance will decrease Outcome: Progressing   Problem: Nutrition: Goal: Adequate nutrition will be maintained Outcome: Progressing   Problem: Elimination: Goal: Will not experience complications related to bowel motility Outcome: Progressing Goal: Will not experience complications related to urinary retention Outcome: Progressing   Problem: Coping: Goal: Level of anxiety will  decrease Outcome: Progressing   Problem: Elimination: Goal: Will not experience complications related to bowel motility Outcome: Progressing Goal: Will not experience complications related to urinary retention Outcome: Progressing   Problem: Pain Managment: Goal: General experience of comfort will improve Outcome: Progressing   Problem: Safety: Goal: Ability to remain free from injury will improve Outcome: Progressing   Problem: Skin Integrity: Goal: Risk for impaired skin integrity will decrease Outcome: Progressing

## 2021-11-20 LAB — BASIC METABOLIC PANEL
Anion gap: 9 (ref 5–15)
BUN: 50 mg/dL — ABNORMAL HIGH (ref 6–20)
CO2: 32 mmol/L (ref 22–32)
Calcium: 8.9 mg/dL (ref 8.9–10.3)
Chloride: 94 mmol/L — ABNORMAL LOW (ref 98–111)
Creatinine, Ser: 1.22 mg/dL — ABNORMAL HIGH (ref 0.44–1.00)
GFR, Estimated: 55 mL/min — ABNORMAL LOW (ref >60)
Glucose, Bld: 283 mg/dL — ABNORMAL HIGH (ref 70–99)
Potassium: 4.1 mmol/L (ref 3.5–5.1)
Sodium: 135 mmol/L (ref 135–145)

## 2021-11-20 LAB — CBC
HCT: 41.1 % (ref 36.0–46.0)
Hemoglobin: 13 g/dL (ref 12.0–15.0)
MCH: 25.7 pg — ABNORMAL LOW (ref 26.0–34.0)
MCHC: 31.6 g/dL (ref 30.0–36.0)
MCV: 81.4 fL (ref 80.0–100.0)
Platelets: 261 10*3/uL (ref 150–400)
RBC: 5.05 MIL/uL (ref 3.87–5.11)
RDW: 13.4 % (ref 11.5–15.5)
WBC: 10.4 10*3/uL (ref 4.0–10.5)
nRBC: 0 % (ref 0.0–0.2)

## 2021-11-20 LAB — GLUCOSE, CAPILLARY
Glucose-Capillary: 322 mg/dL — ABNORMAL HIGH (ref 70–99)
Glucose-Capillary: 107 mg/dL — ABNORMAL HIGH (ref 70–99)
Glucose-Capillary: 392 mg/dL — ABNORMAL HIGH (ref 70–99)
Glucose-Capillary: 379 mg/dL — ABNORMAL HIGH (ref 70–99)

## 2021-11-20 MED ORDER — MECLIZINE HCL 25 MG PO TABS: 12.5000 mg | ORAL_TABLET | Freq: Three times a day (TID) | ORAL | Status: DC | PRN

## 2021-11-20 NOTE — TOC CM/SW Note (Signed)
  Transition of Care (TOC) Screening Note   Patient Details  Name: Collie Kittel Decoste Date of Birth: 10-30-1975   Transition of Care [facility]) CM/SW Contact:    Margarito Liner, LCSW Phone Number: 11/20/2021, 9:11 AM    Transition of Care Department [facility]) has reviewed patient and no TOC needs have been identified at this time. We will continue to monitor patient advancement through interdisciplinary progression rounds. If new patient transition needs arise, please place a TOC consult.

## 2021-11-20 NOTE — Progress Notes (Signed)
  Progress Note   Patient: Caitlyn Fuller OVZ:[id number] DOB: 08/21/1975 DOA: 11/16/2021     4 DOS: the patient was seen and examined on 11/20/2021     Assessment and Plan: * Acute on chronic diastolic CHF (congestive heart failure) (HCC) Today's weight down from yesterday's 103 kg.  Urinated well with Zaroxolyn and IV Lasix increased dose yesterday.  Continue Lasix 60 mg IV twice daily.  Patient refuses all blood pressure medications at this point.  Discontinue Toprol.  Hyperosmolar hyperglycemic state (HHS) (HCC) On admission.  Now improved.  Increased Levemir to 45 units at night and increase NovoLog 10 units prior to meals.    Hypertensive emergency On admission likely secondary to pain.  Patient declined to have any antihypertensive medications at this time besides Lasix.  Anxiety Discontinued Atarax which could be causing lethargy.  Trial of buspirone.  Myocardial injury Elevated troponin likely demand ischemia from CHF.  Obesity, Class III, BMI 40-49.9 (morbid obesity) (HCC) BMI 47.46 with current height and weight in computer.   Pseudohyponatremia Secondary to elevated sugars upon presentation.  Last sodium normal range.  GERD (gastroesophageal reflux disease) Start Pepcid  Chronic pain Patient refused MRI of the lumbar spine.  I do not see any previous record of MRI.  Continue Subutex at night and oxycodone 15 mg every 4 hours as needed.        Subjective: Patient states that she feels dizzy and does not want to take the blood pressure medications.  States her swelling is a little bit better today.  Complains of a lot of pain.  Physical Exam: Vitals:   11/20/21 0501 11/20/21 0751 11/20/21 0849 11/20/21 1300  BP: (!) 145/95 (!) 159/85    Pulse: 77 79    Resp: 20 12    Temp: 98.9 F (37.2 C) 97.9 F (36.6 C)    TempSrc: Oral     SpO2:   98%   Weight:    103 kg  Height:       Physical Exam HENT:     Head: Normocephalic.     Mouth/Throat:      Pharynx: No oropharyngeal exudate.  Eyes:     General: Lids are normal.     Conjunctiva/sclera: Conjunctivae normal.  Cardiovascular:     Rate and Rhythm: Normal rate and regular rhythm.     Heart sounds: Normal heart sounds, S1 normal and S2 normal.  Pulmonary:     Breath sounds: No decreased breath sounds, wheezing, rhonchi or rales.  Abdominal:     Palpations: Abdomen is soft.     Tenderness: There is no abdominal tenderness.  Musculoskeletal:     Right lower leg: Swelling present.     Left lower leg: Swelling present.  Skin:    General: Skin is warm.     Findings: No rash.  Neurological:     Mental Status: She is alert and oriented to person, place, and time.     Comments: Able to straight leg raise but painful to do so in her lower back.     Data Reviewed: BUN 15 creatinine 1.22  Disposition: Status is: Inpatient Remains inpatient appropriate because: Patient feeling dizzy and wants to come off antihypertensive medications.  Overall she just on IV Lasix.  Planned Discharge Destination: Home    Time spent: 28 minutes  Author: Alford Highland, MD 11/20/2021 1:19 PM  For on call review [email or web address].

## 2021-11-21 ENCOUNTER — Inpatient Hospital Stay: Payer: Medicaid Other

## 2021-11-21 DIAGNOSIS — R42 Dizziness and giddiness: Secondary | ICD-10-CM

## 2021-11-21 LAB — BASIC METABOLIC PANEL
Anion gap: 10 (ref 5–15)
BUN: 53 mg/dL — ABNORMAL HIGH (ref 6–20)
CO2: 35 mmol/L — ABNORMAL HIGH (ref 22–32)
Calcium: 8.8 mg/dL — ABNORMAL LOW (ref 8.9–10.3)
Chloride: 89 mmol/L — ABNORMAL LOW (ref 98–111)
Creatinine, Ser: 1.16 mg/dL — ABNORMAL HIGH (ref 0.44–1.00)
GFR, Estimated: 59 mL/min — ABNORMAL LOW (ref >60)
Glucose, Bld: 280 mg/dL — ABNORMAL HIGH (ref 70–99)
Potassium: 3.7 mmol/L (ref 3.5–5.1)
Sodium: 134 mmol/L — ABNORMAL LOW (ref 135–145)

## 2021-11-21 LAB — URINE DRUG SCREEN, QUALITATIVE (ARMC ONLY)
Amphetamines, Ur Screen: NOT DETECTED
Barbiturates, Ur Screen: NOT DETECTED
Benzodiazepine, Ur Scrn: NOT DETECTED
Cannabinoid 50 Ng, Ur ~~LOC~~: NOT DETECTED
Cocaine Metabolite,Ur ~~LOC~~: NOT DETECTED
MDMA (Ecstasy)Ur Screen: NOT DETECTED
Methadone Scn, Ur: NOT DETECTED
Opiate, Ur Screen: POSITIVE — AB
Phencyclidine (PCP) Ur S: NOT DETECTED
Tricyclic, Ur Screen: NOT DETECTED

## 2021-11-21 LAB — GLUCOSE, CAPILLARY
Glucose-Capillary: 212 mg/dL — ABNORMAL HIGH (ref 70–99)
Glucose-Capillary: 364 mg/dL — ABNORMAL HIGH (ref 70–99)
Glucose-Capillary: 177 mg/dL — ABNORMAL HIGH (ref 70–99)
Glucose-Capillary: 326 mg/dL — ABNORMAL HIGH (ref 70–99)

## 2021-11-21 LAB — MAGNESIUM: Magnesium: 2.1 mg/dL (ref 1.7–2.4)

## 2021-11-21 LAB — ANA W/REFLEX IF POSITIVE: Anti Nuclear Antibody (ANA): NEGATIVE

## 2021-11-21 MED ORDER — INSULIN ASPART 100 UNIT/ML IJ SOLN
15.0000 [IU] | Freq: Three times a day (TID) | INTRAMUSCULAR | Status: DC
  Administered 2021-11-21 – 2021-11-22 (×3): 15 [IU] via SUBCUTANEOUS
  Filled 2021-11-21 (×3): qty 1

## 2021-11-21 MED ORDER — POTASSIUM CHLORIDE CRYS ER 20 MEQ PO TBCR
20.0000 meq | EXTENDED_RELEASE_TABLET | Freq: Two times a day (BID) | ORAL | Status: DC
  Administered 2021-11-22: 20 meq via ORAL
  Filled 2021-11-21: qty 1

## 2021-11-21 MED ORDER — INSULIN DETEMIR 100 UNIT/ML ~~LOC~~ SOLN
48.0000 [IU] | Freq: Every day | SUBCUTANEOUS | Status: DC
  Administered 2021-11-21: 48 [IU] via SUBCUTANEOUS
  Filled 2021-11-21: qty 0.48

## 2021-11-21 MED ORDER — INSULIN ASPART 100 UNIT/ML IJ SOLN
0.0000 [IU] | Freq: Every day | INTRAMUSCULAR | Status: DC
  Administered 2021-11-21: 4 [IU] via SUBCUTANEOUS
  Filled 2021-11-21: qty 1

## 2021-11-21 MED ORDER — ASPIRIN 81 MG PO TBEC
81.0000 mg | DELAYED_RELEASE_TABLET | Freq: Every day | ORAL | Status: DC
  Administered 2021-11-21 – 2021-11-22 (×2): 81 mg via ORAL
  Filled 2021-11-21 (×2): qty 1

## 2021-11-21 MED ORDER — OXYCODONE HCL 5 MG PO TABS
15.0000 mg | ORAL_TABLET | Freq: Four times a day (QID) | ORAL | Status: DC | PRN
  Administered 2021-11-21 – 2021-11-22 (×2): 15 mg via ORAL
  Filled 2021-11-21 (×2): qty 3

## 2021-11-21 MED ORDER — INSULIN ASPART 100 UNIT/ML IJ SOLN
0.0000 [IU] | Freq: Three times a day (TID) | INTRAMUSCULAR | Status: DC
  Administered 2021-11-21: 3 [IU] via SUBCUTANEOUS
  Administered 2021-11-21: 15 [IU] via SUBCUTANEOUS
  Administered 2021-11-22: 11 [IU] via SUBCUTANEOUS
  Filled 2021-11-21 (×3): qty 1

## 2021-11-21 MED ORDER — FUROSEMIDE 40 MG PO TABS
40.0000 mg | ORAL_TABLET | Freq: Two times a day (BID) | ORAL | Status: DC
  Administered 2021-11-22: 40 mg via ORAL
  Filled 2021-11-21: qty 1

## 2021-11-21 NOTE — TOC Initial Note (Signed)
Transition of Care [facility]) - Initial/Assessment Note    Patient Details  Name: Caitlyn Fuller MRN: [id number] Date of Birth: 1975-11-26  Transition of Care [facility]) CM/SW Contact:    Margarito Liner, LCSW Phone Number: 11/21/2021, 11:37 AM  Clinical Narrative:    Readmission prevention screen complete. CSW introduced role and explained that discharge planning would be discussed. PCP is a [facility]. She does not see a specific provider there. A friend transports her to appointments. She uses the pharmacy a her PCP office or [facility]. No issues obtaining medications. No home health or DME use prior to admission. Patient said she is not supposed to be home alone since her stroke and requested a home health RN to stay with her. Explained that the home health we arrange only comes out 2-3 times per week for about 45 minutes-1 hour. Anything more than that would be private pay or I offered to make referral to CAP program. Patient said she wants to find her own nurse. Encouraged her to contact DSS if she changes her mind about CAP program. Patient requesting scale and BP cuff at discharge. TOC is currently out of scales for patients less than 400 lbs. Heart Failure Nurse Navigator will check heart failure clinic for scale. MD consulted PT/OT. Will follow up if recommendations are made. No further concerns. CSW encouraged patient to contact CSW as needed. CSW will continue to follow patient for support and facilitate return home once stable. Her friend will transport her home at discharge.               Expected Discharge Plan: Home/Self Care Barriers to Discharge: Continued Medical Work up   Patient Goals and CMS Choice        Expected Discharge Plan and Services Expected Discharge Plan: Home/Self Care       Living arrangements for the past 2 months: Single Family Home                                      Prior Living Arrangements/Services Living arrangements for the past  2 months: Single Family Home   Patient language and need for interpreter reviewed:: Yes Do you feel safe going back to the place where you live?: Yes      Need for Family Participation in Patient Care: Yes (Comment)     Criminal Activity/Legal Involvement Pertinent to Current Situation/Hospitalization: No - Comment as needed  Activities of Daily Living Home Assistive Devices/Equipment: CBG Meter ADL Screening (condition at time of admission) Patient's cognitive ability adequate to safely complete daily activities?: Yes Is the patient deaf or have difficulty hearing?: No Does the patient have difficulty seeing, even when wearing glasses/contacts?: No Does the patient have difficulty concentrating, remembering, or making decisions?: No Patient able to express need for assistance with ADLs?: Yes Does the patient have difficulty dressing or bathing?: No Independently performs ADLs?: Yes (appropriate for developmental age) Does the patient have difficulty walking or climbing stairs?: No Weakness of Legs: None Weakness of Arms/Hands: None  Permission Sought/Granted                  Emotional Assessment Appearance:: Appears stated age Attitude/Demeanor/Rapport: Engaged Affect (typically observed): Accepting, Appropriate Orientation: : Oriented to Self, Oriented to Place, Oriented to  Time, Oriented to Situation Alcohol / Substance Use: Not Applicable Psych Involvement: No (comment)  Admission diagnosis:  Hyperglycemia [R73.9] Uncontrolled hypertension [  I10] Acute congestive heart failure, unspecified heart failure type (HCC) [I50.9] Hyperosmolar hyperglycemic state (HHS) (HCC) [E11.00] Patient Active Problem List   Diagnosis Date Noted   Pseudohyponatremia 11/19/2021   Chronic pain 11/17/2021   GERD (gastroesophageal reflux disease) 11/17/2021   Hyperosmolar hyperglycemic state (HHS) (HCC) 11/16/2021   Myocardial injury 11/16/2021   Acute on chronic diastolic CHF  (congestive heart failure) (HCC) 11/16/2021   Tobacco abuse 11/16/2021   Anxiety 11/16/2021   Facial swelling 09/26/2021   Obesity, Class III, BMI 40-49.9 (morbid obesity) (HCC) 09/26/2021   Hyperlipidemia 09/26/2021   Seizure disorder (HCC) 09/26/2021   ICH (intracerebral hemorrhage) (HCC) 04/12/2021   Hyperglycemia without ketosis    Polysubstance abuse (HCC)    Herpes zoster    Renal mass, left    Hypertensive emergency    Hyperglycemia 11/01/2014   PCP:  Inc, [facility] Pharmacy:   [facility] - [place],  - [address] [place] [address] Phone: [phone] Fax: [phone]  [facility] - [place], [place] - [address] [place] [address] Phone: [phone] Fax: [phone]  [facility] - [place], [place] - [address] [place] [address] Phone: [phone] Fax: [phone]     Social Determinants of Health (SDOH) Interventions    Readmission Risk Interventions    11/21/2021   11:34 AM  Readmission Risk Prevention Plan  Transportation Screening Complete  PCP or Specialist Appt within 3-5 Days Complete  Social Work Consult for Recovery Care Planning/Counseling Complete  Palliative Care Screening Not Applicable  Medication Review [employer]) Complete

## 2021-11-21 NOTE — Discharge Instructions (Signed)

## 2021-11-21 NOTE — Evaluation (Addendum)
Physical Therapy Evaluation Patient Details Name: Caitlyn Fuller MRN: [id number] DOB: 01-09-1976 Today's Date: 11/21/2021  History of Present Illness  Pt is a 43 female admitted for acute on chronic diastolic CHF. PMH DM, seizures, asthma HTN, chronic opiate use, L temporal lobe hemorrhage.   Clinical Impression  Pt A&Ox4, reported "everything" hurt, RN notified. Pt stated at baseline she lives with her significant other and significant other's mother, available PRN since they work. Reported she is independent at baseline.  The patient was able to perform supine <> sit and transfers modI. Initiated gait with RW but pt progressed to ambulating without, supervision ~170ft. Pt had several standing rest breaks that she initiated due to her chronic pain, HR and spO2 WFLs throughout. She also scored a 22 on the DGI indicating low fall risk. Overall the patient demonstrated near return to baseline level of functioning but would benefit from further skilled PT intervention to maximize safety, function, and endurance/activity tolerance, as well as address chronic pain as able.        Recommendations for follow up therapy are one component of a multi-disciplinary discharge planning process, led by the attending physician.  Recommendations may be updated based on patient status, additional functional criteria and insurance authorization.  Follow Up Recommendations Outpatient PT      Assistance Recommended at Discharge Intermittent Supervision/Assistance  Patient can return home with the following  Assistance with cooking/housework;Help with stairs or ramp for entrance    Equipment Recommendations None recommended by PT  Recommendations for Other Services       Functional Status Assessment Patient has not had a recent decline in their functional status     Precautions / Restrictions Precautions Precautions: Fall Restrictions Weight Bearing Restrictions: No      Mobility  Bed  Mobility Overal bed mobility: Modified Independent                  Transfers Overall transfer level: Modified independent Equipment used: Rolling walker (2 wheels), None               General transfer comment: started with RW, able to progress to returning to room without it    Ambulation/Gait Ambulation/Gait assistance: Supervision Gait Distance (Feet): 180 Feet Assistive device: Rolling walker (2 wheels), None         General Gait Details: initiated gait with RW, progressed to ambulating without. Pt with several standing rest breaks due to pain. no LOB or unsteadiness  Stairs            Wheelchair Mobility    Modified Rankin (Stroke Patients Only)       Balance                                 Standardized Balance Assessment Standardized Balance Assessment : Dynamic Gait Index   Dynamic Gait Index Level Surface: Normal Change in Gait Speed: Normal Gait with Horizontal Head Turns: Normal Gait with Vertical Head Turns: Normal Gait and Pivot Turn: Mild Impairment Step Over Obstacle: Normal Step Around Obstacles: Normal Steps: Mild Impairment (clinical judgement) Total Score: 22       Pertinent Vitals/Pain Pain Assessment Pain Assessment: Faces Faces Pain Scale: Hurts even more Pain Location: "everything" Pain Descriptors / Indicators: Grimacing, Moaning, Guarding Pain Intervention(s): Limited activity within patient's tolerance, Monitored during session, Patient requesting pain meds-RN notified, Repositioned    Home Living Family/patient expects to be discharged to:: Private residence  Living Arrangements: Spouse/significant other;Other (Comment) (girlfriends mom) Available Help at Discharge: Family;Available PRN/intermittently Type of Home: Apartment Home Access: Stairs to enter Entrance Stairs-Rails: Right;Left Entrance Stairs-Number of Steps: 2 flights; 20   Home Layout: One level Home Equipment: None      Prior  Function Prior Level of Function : Independent/Modified Independent;Driving                     Hand Dominance        Extremity/Trunk Assessment   Upper Extremity Assessment Upper Extremity Assessment: Overall WFL for tasks assessed    Lower Extremity Assessment Lower Extremity Assessment: Overall WFL for tasks assessed    Cervical / Trunk Assessment Cervical / Trunk Assessment: Normal  Communication   Communication: No difficulties  Cognition Arousal/Alertness: Awake/alert Behavior During Therapy: WFL for tasks assessed/performed Overall Cognitive Status: Within Functional Limits for tasks assessed                                          General Comments      Exercises     Assessment/Plan    PT Assessment Patient needs continued PT services  PT Problem List Decreased strength;Decreased mobility;Decreased activity tolerance;Decreased balance;Pain       PT Treatment Interventions DME instruction;Therapeutic activities;Gait training;Therapeutic exercise;Stair training;Patient/family education;Balance training;Functional mobility training;Neuromuscular re-education    PT Goals (Current goals can be found in the Care Plan section)  Acute Rehab PT Goals Patient Stated Goal: to have less pain PT Goal Formulation: With patient Time For Goal Achievement: 12/05/21 Potential to Achieve Goals: Good    Frequency Min 2X/week     Co-evaluation               AM-PAC PT "6 Clicks" Mobility  Outcome Measure Help needed turning from your back to your side while in a flat bed without using bedrails?: None Help needed moving from lying on your back to sitting on the side of a flat bed without using bedrails?: None Help needed moving to and from a bed to a chair (including a wheelchair)?: None Help needed standing up from a chair using your arms (e.g., wheelchair or bedside chair)?: None Help needed to walk in hospital room?: None Help needed  climbing 3-5 steps with a railing? : None 6 Click Score: 24    End of Session Equipment Utilized During Treatment: Gait belt Activity Tolerance: Patient tolerated treatment well Patient left: with call bell/phone within reach;in bed;with bed alarm set Nurse Communication: Mobility status;Patient requests pain meds PT Visit Diagnosis: Other abnormalities of gait and mobility (R26.89)    Time: [id number] PT Time Calculation (min) (ACUTE ONLY): 19 min   Charges:   PT Evaluation $PT Eval Low Complexity: 1 Low PT Treatments $Therapeutic Activity: 8-22 mins        Lieutenant Diego PT, DPT 2:16 PM,11/21/21

## 2021-11-21 NOTE — Assessment & Plan Note (Signed)
History of prior stroke.  CT scan of the head negative.  Refused MRI.  Meclizine as needed.  PT and OT consultations.

## 2021-11-21 NOTE — Progress Notes (Signed)
Progress Note   Patient: Caitlyn Fuller LZJ:[id number] DOB: 1975/07/09 DOA: 11/16/2021     5 DOS: the patient was seen and examined on 11/21/2021     Assessment and Plan: * Acute on chronic diastolic CHF (congestive heart failure) (HCC) Hold Lasix today likely with overdiuresis.  Today's weight still pending.  Yesterday's weight 1.3 kg.  Restart Lasix tomorrow oral.  Patient does not want any other medications.  Hyperosmolar hyperglycemic state (HHS) (HCC) On admission.  Now improved.  Increased Levemir to 48 units at night and increase NovoLog 15 units prior to meals.    Hypertensive emergency On admission likely secondary to pain.  Patient declined to have any antihypertensive medications at this time besides Lasix.  Anxiety Discontinued Atarax which could be causing patient's lethargy.  Myocardial injury Elevated troponin likely demand ischemia from CHF.  Obesity, Class III, BMI 40-49.9 (morbid obesity) (HCC) BMI 47.46 with current height and weight in computer.   Dizziness History of prior stroke.  CT scan of the head negative.  Refused MRI.  Meclizine as needed.  PT and OT consultations.  Pseudohyponatremia Secondary to elevated sugars upon presentation.  Last sodium normal range.  GERD (gastroesophageal reflux disease) Start Pepcid  Chronic pain Patient refused MRI of the lumbar spine.  I do not see any previous record of MRI.  Continue Subutex at night and oxycodone 15 mg every 4 hours as needed.        Subjective: Patient with numerous complaints.  Does not feel safe to go home.  Complains of some dizziness.  Refused MRI but okay to have a CAT scan.  Never took aspirin from prior hospitalization.  Breathing is better.  Physical Exam: Vitals:   11/20/21 1300 11/20/21 2018 11/21/21 0611 11/21/21 0756  BP:  124/74 126/67 (!) 144/83  Pulse:  81 73 80  Resp:  20 16 16   Temp:  98.6 F (37 C) 98.9 F (37.2 C) 98.8 F (37.1 C)  TempSrc:  Oral  Oral  SpO2:   98% 100% 96%  Weight: 103 kg     Height:       Physical Exam HENT:     Head: Normocephalic.     Mouth/Throat:     Pharynx: No oropharyngeal exudate.  Eyes:     General: Lids are normal.     Conjunctiva/sclera: Conjunctivae normal.  Cardiovascular:     Rate and Rhythm: Normal rate and regular rhythm.     Heart sounds: Normal heart sounds, S1 normal and S2 normal.  Pulmonary:     Breath sounds: No decreased breath sounds, wheezing, rhonchi or rales.  Abdominal:     Palpations: Abdomen is soft.     Tenderness: There is no abdominal tenderness.  Musculoskeletal:     Right lower leg: Swelling present.     Left lower leg: Swelling present.  Skin:    General: Skin is warm.     Findings: No rash.  Neurological:     Mental Status: She is alert and oriented to person, place, and time.     Comments: Cranial nerves II through XII grossly intact.  Power 5 out of 5 upper and lower extremity.  Extraocular muscles intact.  No nystagmus.     Data Reviewed: CT scan negative. Sodium 134, potassium 3.7, glucose 288, BUN 53 and creatinine 1.16.  Magnesium 2.1.  Disposition: Status is: Inpatient Remains inpatient appropriate because: Patient again with numerous complaints on how she is feeling.  Again declined MRI.  CT scan of  the head done which was negative.  Planned Discharge Destination: Home    Time spent: 30 minutes  Author: Alford Highland, MD 11/21/2021 1:50 PM  For on call review [email or web address].

## 2021-11-21 NOTE — Inpatient Diabetes Management (Signed)
Inpatient Diabetes Program Recommendations  AACE/ADA: New Consensus Statement on Inpatient Glycemic Control (2015)  Target Ranges:  Prepandial:   less than 140 mg/dL      Peak postprandial:   less than 180 mg/dL (1-2 hours)      Critically ill patients:  140 - 180 mg/dL    Latest Reference Range & Units 11/20/21 08:21 11/20/21 12:35 11/20/21 16:09 11/20/21 20:18  Glucose-Capillary 70 - 99 mg/dL 259 (H)  25 units Novolog  107 (H)  Novolog Meal Coverage NOT given--Pt Refused dose 392 (H)  30 units Novolog  379 (H)  20 units Novolog  45 units Levemir  (H): Data is abnormally high  Latest Reference Range & Units 11/21/21 07:59  Glucose-Capillary 70 - 99 mg/dL 563 (H)  (H): Data is abnormally high    Home DM Meds: Levemir 40 units Daily   Current Orders: Levemir 45 units QHS      Novolog Resistant Correction Scale/ SSI (0-20 units) TID AC + HS      Novolog 10 units TID with meals   MD- Note pt refused to take Novolog Meal Coverage dose yesterday at 12pm with Lunch (10 units Novolog)--CBG by 4pm severely elevated to 392   CBG 212 this AM  Please consider:  1. Increase Levemir slightly to 48 units QHS  2. Increase Novolog Meal Coverage to 15 units TID with meals    --Will follow patient during hospitalization--  Ambrose Finland RN, MSN, CDCES Diabetes Coordinator Inpatient Glycemic Control Team Team Pager: [phone] (8a-5p)

## 2021-11-21 NOTE — Progress Notes (Signed)
Nutrition Brief Note  RD pulled to chart secondary to CHF.   Wt Readings from Last 15 Encounters:  11/20/21 103 kg  09/25/21 97.5 kg  05/02/21 98.4 kg  04/12/21 98.4 kg  08/21/20 98.4 kg  12/30/18 109.8 kg  05/11/18 108.9 kg  02/05/18 116.1 kg  11/11/17 120.7 kg  08/30/17 120.7 kg  08/22/17 120.7 kg  08/01/17 120.7 kg  07/25/17 120.2 kg  06/24/17 118.8 kg  12/13/16 108.9 kg   Pt with medical history significant of diastolic CHF, hypertension, hyperlipidemia, diabetes mellitus, morbid obesity with BMI of 44.9, asthma, polysubstance abuse, tobacco abuse, on Suboxone, ICH, seizure, who presents with shortness of breath, leg edema.   Pt admitted with HHS.  Per MD notes, HHS likely related to medication noncompliance. Pt has been refusing some insulin doses here as well per MD coordinator note.   RD provided "Heart Healthy, Consistent Carbohydrate Nutrition Therapy" handout from [facility] Nutrition Care Manual; attached to AVS/ discharge summary. RD also referred to [facility]'s Nutrition and Diabetes Education Services for further reinforcement and support.   Lab Results  Component Value Date   HGBA1C 12.5 (H) 11/17/2021   PTA DM medications are 40 units insulin detemir daily.   Labs reviewed: CBGS: 107-392 (inpatient orders for glycemic control are 0-15 units insulin aspart TID with meals, 0-5 units insulin aspart daily at bedtime, 15 units insulin aspart TID with meals, and 48 units insulin determir daily at bedtime).    Body mass index is 47.46 kg/m. Patient meets criteria for obesity, class III based on current BMI. Obesity is a complex, chronic medical condition that is optimally managed by a multidisciplinary care team. Weight loss is not an ideal goal for an acute inpatient hospitalization. However, if further work-up for obesity is warranted, consider outpatient referral to outpatient bariatric service and/or [facility]'s Nutrition and Diabetes Education Services.    Current  diet order is heart healthy/ carb modified, patient is consuming approximately n/a% of meals at this time. Labs and medications reviewed.   No nutrition interventions warranted at this time. If nutrition issues arise, please consult RD.   Levada Schilling, RD, LDN, CDCES Registered Dietitian II Certified Diabetes Care and Education Specialist Please refer to [facility] for RD and/or RD on-call/weekend/after hours pager

## 2021-11-22 LAB — GLUCOSE, CAPILLARY: Glucose-Capillary: 347 mg/dL — ABNORMAL HIGH (ref 70–99)

## 2021-11-22 LAB — RHEUMATOID FACTOR: Rheumatoid fact SerPl-aCnc: 11.7 IU/mL (ref <14.0)

## 2021-11-22 MED ORDER — BLOOD GLUCOSE MONITOR KIT: PACK | 0 refills | Status: DC

## 2021-11-22 MED ORDER — FUROSEMIDE 40 MG PO TABS: 40.0000 mg | ORAL_TABLET | Freq: Every day | ORAL | 0 refills | Status: DC

## 2021-11-22 MED ORDER — FAMOTIDINE 20 MG PO TABS: 20.0000 mg | ORAL_TABLET | Freq: Every day | ORAL | 0 refills | Status: DC

## 2021-11-22 MED ORDER — ATORVASTATIN CALCIUM 10 MG PO TABS: 10.0000 mg | ORAL_TABLET | Freq: Every day | ORAL | 0 refills | Status: DC

## 2021-11-22 MED ORDER — INSULIN PEN NEEDLE 33G X 5 MM MISC: 1.0000 | Freq: Three times a day (TID) | 1 refills | Status: DC

## 2021-11-22 MED ORDER — INSULIN ASPART 100 UNIT/ML IJ SOLN: INTRAMUSCULAR | 11 refills | Status: DC

## 2021-11-22 MED ORDER — POTASSIUM CHLORIDE CRYS ER 20 MEQ PO TBCR: 20.0000 meq | EXTENDED_RELEASE_TABLET | Freq: Every day | ORAL | 0 refills | Status: DC

## 2021-11-22 MED ORDER — INSULIN DETEMIR 100 UNIT/ML FLEXPEN: 50.0000 [IU] | PEN_INJECTOR | Freq: Every day | SUBCUTANEOUS | 0 refills | Status: DC

## 2021-11-22 MED ORDER — OXYCODONE HCL 15 MG PO TABS: 15.0000 mg | ORAL_TABLET | Freq: Four times a day (QID) | ORAL | 0 refills | Status: DC | PRN

## 2021-11-22 MED ORDER — ASPIRIN 81 MG PO TBEC: 81.0000 mg | DELAYED_RELEASE_TABLET | Freq: Every day | ORAL | 0 refills | Status: DC

## 2021-11-22 NOTE — Discharge Summary (Signed)
Physician Discharge Summary   Patient: Caitlyn Fuller MRN: [id number] DOB: 1975-02-16  Admit date:     11/16/2021  Discharge date: 11/22/21  Discharge Physician: Loletha Grayer   PCP: [facility]   Recommendations at discharge:   Your medical doctor 5 days CHF clinic  Discharge Diagnoses: Principal Problem:   Acute on chronic diastolic CHF (congestive heart failure) ([place]) Active Problems:   Hyperosmolar hyperglycemic state (HHS) ([place])   Hypertensive emergency   Polysubstance abuse ([place])   Tobacco abuse   Anxiety   Myocardial injury   Hyperlipidemia   Obesity, Class III, BMI 40-49.9 (morbid obesity) ([place])   Hyperglycemia   Chronic pain   GERD (gastroesophageal reflux disease)   Pseudohyponatremia   Dizziness    Hospital Course: Patient was admitted on 11/16/2021 and discharged on 11/22/2021.  Patient came in with shortness of breath and leg edema and elevated sugars.  Patient was started on IV Lasix and medications for heart failure.  During the entire hospital course she was complaining that she was not feeling well secondary to the medications.  She was unable to tolerate any of the medications except for Lasix.  The patient did not want to go back on Suboxone and wanted a prescription for oxycodone.  I advised that I am only able to give a few days of oxycodone and she will have to follow-up with the Suboxone clinic or with her medical doctor for any refills.  Assessment and Plan: * Acute on chronic diastolic CHF (congestive heart failure) (HCC) Restart Lasix 40 mg daily.  Patient does not want any other medications.  She states that she was unable to tolerate the beta-blocker and ARB.  Will refer to CHF clinic.  Weigh on a daily basis.  EF is 60 to 65% with moderate left ventricular hypertrophy.  Hyperosmolar hyperglycemic state (HHS) ([place]) On admission.  Now improved.  Increased Levemir to 50 units at night and increase NovoLog 15 units prior to  meals.  Last hemoglobin A1c 12.5.  Hypertensive emergency On admission likely secondary to pain.  Patient declined to have any antihypertensive medications at this time besides Lasix.  Anxiety Discontinued Atarax which could be causing patient's lethargy.  Myocardial injury Elevated troponin likely demand ischemia from CHF.  Hyperlipidemia Low-dose Lipitor prescribed  Obesity, Class III, BMI 40-49.9 (morbid obesity) (HCC) BMI 48.76 with current height and weight in computer.   Dizziness History of prior stroke.  CT scan of the head negative.  Refused MRI.  Continue aspirin.  Yesterday patient walked to the cafeteria gift shop and was able to buy food and gifts.  Pseudohyponatremia Secondary to elevated sugars upon presentation.  Last sodium normal range.  GERD (gastroesophageal reflux disease) Start Pepcid  Chronic pain Patient refused MRI of the lumbar spine.  I do not see any previous record of MRI.  Prescribed oxycodone 15 mg every 6 hours as needed for pain only a few days supply.  Advised that if she goes back on her Suboxone she has to wait for least 8 hours after the last oxycodone dose.         Consultants: none Procedures performed: none  Disposition: Home Diet recommendation:  Cardiac and Carb modified diet DISCHARGE MEDICATION: Allergies as of 11/22/2021       Reactions   Amlodipine Swelling   Gabapentin Anaphylaxis   Morphine And Related Anaphylaxis   Darvocet [propoxyphene N-acetaminophen] Hives   Toradol [ketorolac Tromethamine] Other (See Comments)   migraines   Ultram [tramadol]  Hives   Vancomycin Itching        Medication List     STOP taking these medications    buprenorphine-naloxone 8-2 mg Subl SL tablet Commonly known as: SUBOXONE       TAKE these medications    aspirin EC 81 MG tablet Take 1 tablet (81 mg total) by mouth daily. Swallow whole.   atorvastatin 10 MG tablet Commonly known as: LIPITOR Take 1 tablet (10 mg  total) by mouth daily.   blood glucose meter kit and supplies Kit Dispense based on patient and insurance preference. Check sugar 4 times a day before meals and bedtime   famotidine 20 MG tablet Commonly known as: PEPCID Take 1 tablet (20 mg total) by mouth daily.   furosemide 40 MG tablet Commonly known as: LASIX Take 1 tablet (40 mg total) by mouth daily.   insulin aspart 100 UNIT/ML injection Commonly known as: novoLOG 15 units subcutaneous injection prior to meals   insulin detemir 100 UNIT/ML FlexPen Commonly known as: LEVEMIR Inject 50 Units into the skin at bedtime. What changed:  how much to take when to take this   Insulin Pen Needle 33G X 5 MM Misc 1 Dose by Does not apply route 3 (three) times daily before meals.   oxyCODONE 15 MG immediate release tablet Commonly known as: ROXICODONE Take 1 tablet (15 mg total) by mouth every 6 (six) hours as needed for moderate pain or severe pain.   potassium chloride SA 20 MEQ tablet Commonly known as: KLOR-CON M Take 1 tablet (20 mEq total) by mouth daily.        [place] Follow up in 5 day(s).   Contact information: [place] [phone]                Discharge Exam: Filed Weights   11/19/21 1124 11/20/21 1300 11/22/21 0441  Weight: 107.5 kg 103 kg 105.8 kg   Physical Exam HENT:     Head: Normocephalic.     Mouth/Throat:     Pharynx: No oropharyngeal exudate.  Eyes:     General: Lids are normal.     Conjunctiva/sclera: Conjunctivae normal.  Cardiovascular:     Rate and Rhythm: Normal rate and regular rhythm.     Heart sounds: Normal heart sounds, S1 normal and S2 normal.  Pulmonary:     Breath sounds: No decreased breath sounds, wheezing, rhonchi or rales.  Abdominal:     Palpations: Abdomen is soft.     Tenderness: There is no abdominal tenderness.  Musculoskeletal:     Right lower leg: Swelling present.     Left  lower leg: Swelling present.  Skin:    General: Skin is warm.     Findings: No rash.  Neurological:     Mental Status: She is alert and oriented to person, place, and time.     Comments: Cranial nerves II through XII grossly intact.  Power 5 out of 5 upper and lower extremity.  Extraocular muscles intact.  No nystagmus.      Condition at discharge: stable  The results of significant diagnostics from this hospitalization (including imaging, microbiology, ancillary and laboratory) are listed below for reference.   Imaging Studies: CT HEAD WO CONTRAST (5MM)  Result Date: 11/21/2021 CLINICAL DATA:  Vertigo EXAM: CT HEAD WITHOUT CONTRAST TECHNIQUE: Contiguous axial images were obtained from the base of the skull through the vertex without intravenous contrast. RADIATION  DOSE REDUCTION: This exam was performed according to the departmental dose-optimization program which includes automated exposure control, adjustment of the mA and/or kV according to patient size and/or use of iterative reconstruction technique. COMPARISON:  05/02/2021 FINDINGS: Brain: No acute intracranial findings are seen. There are no signs of bleeding within the cranium. There is a small subcentimeter old lacunar infarct in right basal ganglia with no change. There is small subcentimeter old lacunar infarct in the right periventricular region in the parietal lobe with no interval change. There is interval resolution of a small 7 mm cystic focus seen in the mesial left temporal lobe since 05/02/2021. Ventricles are not dilated. Vascular: Unremarkable. Skull: Unremarkable. Sinuses/Orbits: Unremarkable. Other: None. IMPRESSION: No acute intracranial findings are seen in noncontrast CT brain. Small subcentimeter old lacunar infarcts in right MCA distribution have not changed. There is interval resolution of his 7 mm cystic focus seen in the mesial left temporal lobe since 05/02/2021. Electronically Signed   By: Elmer Picker M.D.    On: 11/21/2021 12:03   ECHOCARDIOGRAM COMPLETE  Result Date: 11/19/2021    ECHOCARDIOGRAM REPORT   Patient Name:   [name] Date of Exam: 11/19/2021 Medical Rec #:  [id number]           Height:       58.0 in Accession #:    [id number]          Weight:       232.8 lb Date of Birth:  1975/10/03           BSA:          1.943 m Patient Age:    1 years            BP:           144/93 mmHg Patient Gender: F                   HR:           78 bpm. Exam Location:  [facility] Procedure: 2D Echo Indications:     CHF I50.31  History:         Patient has no prior history of Echocardiogram examinations.  Sonographer:     Kathlen Brunswick RDCS Referring Phys:  [id number] Loletha Grayer Diagnosing Phys: Ida Rogue MD  Sonographer Comments: Technically difficult study due to poor echo windows, suboptimal parasternal window, suboptimal subcostal window and patient is obese. Image acquisition challenging due to patient body habitus and Image acquisition challenging due to respiratory motion. IMPRESSIONS  1. Left ventricular ejection fraction, by estimation, is 60 to 65%. The left ventricle has normal function. The left ventricle has no regional wall motion abnormalities. There is moderate left ventricular hypertrophy. Left ventricular diastolic parameters are consistent with Grade II diastolic dysfunction (pseudonormalization).  2. Right ventricular systolic function is normal. The right ventricular size is normal.  3. Left atrial size was mildly dilated.  4. The mitral valve was not well visualized. Mild mitral valve regurgitation. No evidence of mitral stenosis.  5. The aortic valve was not well visualized. Aortic valve regurgitation is not visualized. No aortic stenosis is present.  6. The inferior vena cava is normal in size with greater than 50% respiratory variability, suggesting right atrial pressure of 3 mmHg. FINDINGS  Left Ventricle: Left ventricular ejection fraction, by estimation, is 60 to 65%. The left  ventricle has normal function. The left ventricle has no regional wall motion abnormalities. The left ventricular internal cavity size was normal in  size. There is  moderate left ventricular hypertrophy. Left ventricular diastolic parameters are consistent with Grade II diastolic dysfunction (pseudonormalization). Right Ventricle: The right ventricular size is normal. No increase in right ventricular wall thickness. Right ventricular systolic function is normal. Left Atrium: Left atrial size was mildly dilated. Right Atrium: Right atrial size was normal in size. Pericardium: There is no evidence of pericardial effusion. Mitral Valve: The mitral valve was not well visualized. Mild mitral valve regurgitation. No evidence of mitral valve stenosis. Tricuspid Valve: The tricuspid valve is not well visualized. Tricuspid valve regurgitation is not demonstrated. No evidence of tricuspid stenosis. Aortic Valve: The aortic valve was not well visualized. Aortic valve regurgitation is not visualized. No aortic stenosis is present. Aortic valve peak gradient measures 8.9 mmHg. Pulmonic Valve: The pulmonic valve was not well visualized. Pulmonic valve regurgitation is not visualized. No evidence of pulmonic stenosis. Aorta: The aortic root is normal in size and structure. Venous: The inferior vena cava is normal in size with greater than 50% respiratory variability, suggesting right atrial pressure of 3 mmHg. IAS/Shunts: No atrial level shunt detected by color flow Doppler.  LEFT VENTRICLE PLAX 2D LVIDd:         4.20 cm      Diastology LVIDs:         3.00 cm      LV e' medial:    5.66 cm/s LV PW:         1.70 cm      LV E/e' medial:  19.6 LV IVS:        1.60 cm      LV e' lateral:   5.00 cm/s LVOT diam:     2.10 cm      LV E/e' lateral: 22.2 LV SV:         69 LV SV Index:   35 LVOT Area:     3.46 cm  LV Volumes (MOD) LV vol d, MOD A4C: 109.0 ml LV vol s, MOD A4C: 43.8 ml LV SV MOD A4C:     109.0 ml RIGHT VENTRICLE RV Basal diam:   2.80 cm RV S prime:     15.80 cm/s LEFT ATRIUM           Index        RIGHT ATRIUM          Index LA diam:      4.30 cm 2.21 cm/m   RA Area:     9.59 cm LA Vol (A4C): 36.6 ml 18.84 ml/m  RA Volume:   17.50 ml 9.01 ml/m  AORTIC VALVE                 PULMONIC VALVE AV Area (Vmax): 2.32 cm     PV Vmax:       1.14 m/s AV Vmax:        149.00 cm/s  PV Peak grad:  5.2 mmHg AV Peak Grad:   8.9 mmHg LVOT Vmax:      99.80 cm/s LVOT Vmean:     65.700 cm/s LVOT VTI:       0.199 m  AORTA Ao Root diam: 3.00 cm MITRAL VALVE MV Area (PHT): 4.41 cm     SHUNTS MV Decel Time: 172 msec     Systemic VTI:  0.20 m MV E velocity: 111.00 cm/s  Systemic Diam: 2.10 cm MV A velocity: 100.00 cm/s MV E/A ratio:  1.11 Ida Rogue MD Electronically signed by Ida Rogue MD Signature Date/Time: 11/19/2021/11:36:20 AM  Final    DG Lumbar Spine 2-3 Views  Result Date: 11/18/2021 CLINICAL DATA:  Low back pain. EXAM: LUMBAR SPINE - 2-3 VIEW COMPARISON:  None Available. FINDINGS: There is no evidence of lumbar spine fracture. Alignment is normal. Minimal degenerative disc disease in the lower lumbosacral spine. Minimal posterior facet arthropathy at L4-L5 and L5-S1. IMPRESSION: 1. Minimal degenerative disc disease in the lower lumbosacral spine. 2. Minimal posterior facet arthropathy at L4-L5 and L5-S1. Electronically Signed   By: Fidela Salisbury M.D.   On: 11/18/2021 11:03   DG Chest 2 View  Result Date: 11/16/2021 CLINICAL DATA:  Shortness of breath EXAM: CHEST - 2 VIEW COMPARISON:  Chest x-ray 09/25/2021 FINDINGS: The heart size and mediastinal contours are within normal limits. Both lungs are clear. The visualized skeletal structures are unremarkable. IMPRESSION: No active cardiopulmonary disease. Electronically Signed   By: Donavan Foil M.D.   On: 11/16/2021 17:14    Microbiology: Results for orders placed or performed during the hospital encounter of 11/16/21  SARS Coronavirus 2 by RT PCR (hospital order, performed  in [facility] hospital lab) *cepheid single result test* Anterior Nasal Swab     Status: None   Collection Time: 11/16/21  5:13 PM   Specimen: Anterior Nasal Swab  Result Value Ref Range Status   SARS Coronavirus 2 by RT PCR NEGATIVE NEGATIVE Final    Comment: (NOTE) SARS-CoV-2 target nucleic acids are NOT DETECTED.  The SARS-CoV-2 RNA is generally detectable in upper and lower respiratory specimens during the acute phase of infection. The lowest concentration of SARS-CoV-2 viral copies this assay can detect is 250 copies / mL. A negative result does not preclude SARS-CoV-2 infection and should not be used as the sole basis for treatment or other patient management decisions.  A negative result may occur with improper specimen collection / handling, submission of specimen other than nasopharyngeal swab, presence of viral mutation(s) within the areas targeted by this assay, and inadequate number of viral copies (<250 copies / mL). A negative result must be combined with clinical observations, patient history, and epidemiological information.  Fact Sheet for Patients:   https://www.patel.info/  Fact Sheet for Healthcare Providers: https://hall.com/  This test is not yet approved or  cleared by the [place] FDA and has been authorized for detection and/or diagnosis of SARS-CoV-2 by FDA under an Emergency Use Authorization (EUA).  This EUA will remain in effect (meaning this test can be used) for the duration of the COVID-19 declaration under Section 564(b)(1) of the Act, 21 U.S.C. section 360bbb-3(b)(1), unless the authorization is terminated or revoked sooner.  Performed at [facility], [address] [place], [place] [address]     Labs: CBC: Recent Labs  Lab 11/16/21 1552 11/17/21 0040 11/20/21 0520  WBC 11.4* 10.7* 10.4  NEUTROABS  --  6.8  --   HGB 13.4 12.7 13.0  HCT 42.4 40.6 41.1  MCV 81.2 81.0 81.4   PLT 256 240 662   Basic Metabolic Panel: Recent Labs  Lab 11/16/21 1552 11/16/21 1713 11/17/21 0510 11/17/21 2223 11/18/21 0312 11/19/21 0630 11/20/21 0520 11/21/21 0447  NA 131*   < > 133*  --  136 136 135 134*  K 4.0   < > 3.5  --  4.0 4.5 4.1 3.7  CL 95*   < > 95*  --  101 101 94* 89*  CO2 27   < > 30  --  28 30 32 35*  GLUCOSE 621*   < >  270* 453* 206* 235* 283* 280*  BUN 22*   < > 27*  --  37* 44* 50* 53*  CREATININE 0.94   < > 1.09*  --  1.25* 1.01* 1.22* 1.16*  CALCIUM 8.5*   < > 8.7*  --  8.7* 8.9 8.9 8.8*  MG 1.8  --   --   --   --   --   --  2.1   < > = values in this interval not displayed.   Liver Function Tests: Recent Labs  Lab 11/16/21 1552  AST 18  ALT 17  ALKPHOS 80  BILITOT 0.5  PROT 6.8  ALBUMIN 3.2*   CBG: Recent Labs  Lab 11/21/21 0759 11/21/21 1204 11/21/21 1649 11/21/21 2113 11/22/21 0752  GLUCAP 212* 364* 177* 326* 347*    Discharge time spent: greater than 30 minutes.  Signed: Loletha Grayer, MD [facility] 11/22/2021

## 2021-11-22 NOTE — Evaluation (Signed)
Occupational Therapy Evaluation Patient Details Name: Caitlyn Fuller MRN: [id number] DOB: [birth year] Today's Date: 11/22/2021   History of Present Illness Pt is a 7 female admitted for acute on chronic diastolic CHF. PMH DM, seizures, asthma HTN, chronic opiate use, L temporal lobe hemorrhage.   Clinical Impression   Pt was seen for OT evaluation this date. Prior to hospital admission, pt was indep with ADL and using a SPC for mobility. She lives with her significant other and SO's mother in a 2nd fl apartment (no elevator access). Pt presents to acute OT demonstrating near baseline ADL performance and functional mobility (See OT problem list). Pt currently endorses low back and leg pain and mild lightheadedness/wooziness with getting up earlier but improving with time. Sitting BP 163/101, standing 158/99. RN made aware. Pt denies concerns regarding ADL with return home. RN in at end of session for discharge instructions. No additional acute OT needs at this time. Will sign off.      Recommendations for follow up therapy are one component of a multi-disciplinary discharge planning process, led by the attending physician.  Recommendations may be updated based on patient status, additional functional criteria and insurance authorization.   Follow Up Recommendations  No OT follow up     Assistance Recommended at Discharge PRN  Patient can return home with the following Assistance with cooking/housework;Assist for transportation;Help with stairs or ramp for entrance    Functional Status Assessment  Patient has had a recent decline in their functional status and demonstrates the ability to make significant improvements in function in a reasonable and predictable amount of time.  Equipment Recommendations  None recommended by OT    Recommendations for Other Services       Precautions / Restrictions Precautions Precautions: Fall Restrictions Weight Bearing Restrictions: No       Mobility Bed Mobility Overal bed mobility: Modified Independent                  Transfers Overall transfer level: Modified independent Equipment used: None                      Balance Overall balance assessment: Modified Independent                                         ADL either performed or assessed with clinical judgement   ADL Overall ADL's : Modified independent                                             Vision         Perception     Praxis      Pertinent Vitals/Pain Pain Assessment Pain Assessment: 0-10 Pain Score: 10-Worst pain ever Pain Location: low back, legs Pain Descriptors / Indicators: Aching, Grimacing Pain Intervention(s): Limited activity within patient's tolerance, Monitored during session, Premedicated before session, Repositioned     Hand Dominance     Extremity/Trunk Assessment Upper Extremity Assessment Upper Extremity Assessment: Overall WFL for tasks assessed   Lower Extremity Assessment Lower Extremity Assessment: Overall WFL for tasks assessed   Cervical / Trunk Assessment Cervical / Trunk Assessment: Normal   Communication Communication Communication: No difficulties   Cognition Arousal/Alertness: Awake/alert Behavior During Therapy: WFL for tasks assessed/performed Overall Cognitive  Status: Within Functional Limits for tasks assessed                                       General Comments       Exercises Other Exercises Other Exercises: Pt endorsing feeling a bit whoozy/lightheaded with getting up earlier but improving with time. Sitting BP 163/101, standing 158/99   Shoulder Instructions      Home Living Family/patient expects to be discharged to:: Private residence Living Arrangements: Spouse/significant other;Other (Comment) (girlfriends mom) Available Help at Discharge: Family;Available PRN/intermittently Type of Home: Apartment Home  Access: Stairs to enter Entrance Stairs-Number of Steps: 2 flights; 20 Entrance Stairs-Rails: Right;Left Home Layout: One level     Bathroom Shower/Tub: Chief Strategy Officer: Standard     Home Equipment: None          Prior Functioning/Environment Prior Level of Function : Independent/Modified Independent;Driving                        OT Problem List: Pain      OT Treatment/Interventions:      OT Goals(Current goals can be found in the care plan section) Acute Rehab OT Goals Patient Stated Goal: go home OT Goal Formulation: All assessment and education complete, DC therapy  OT Frequency:      Co-evaluation              AM-PAC OT "6 Clicks" Daily Activity     Outcome Measure Help from another person eating meals?: None Help from another person taking care of personal grooming?: None Help from another person toileting, which includes using toliet, bedpan, or urinal?: None Help from another person bathing (including washing, rinsing, drying)?: None Help from another person to put on and taking off regular upper body clothing?: None Help from another person to put on and taking off regular lower body clothing?: None 6 Click Score: 24   End of Session Nurse Communication: Other (comment) (BP)  Activity Tolerance: Patient tolerated treatment well Patient left: in bed;with call bell/phone within reach;with family/visitor present;with nursing/sitter in room  OT Visit Diagnosis: Other abnormalities of gait and mobility (R26.89)                Time: 9450-3888 OT Time Calculation (min): 17 min Charges:  OT General Charges $OT Visit: 1 Visit OT Evaluation $OT Eval Low Complexity: 1 Low  Arman Filter., MPH, MS, OTR/L ascom [phone] 11/22/21, 9:06 AM

## 2021-11-22 NOTE — Assessment & Plan Note (Signed)
Low-dose Lipitor prescribed

## 2021-11-22 NOTE — TOC Transition Note (Signed)
Transition of Care [facility]) - CM/SW Discharge Note   Patient Details  Name: Caitlyn Fuller MRN: [id number] Date of Birth: 1975/04/02  Transition of Care [facility]) CM/SW Contact:  Margarito Liner, LCSW Phone Number: 11/22/2021, 9:23 AM   Clinical Narrative: Patient has orders to discharge home today. Per OT, patient requesting information on applying for disability. Printed off information for [employer] and wrote down contact information for the [employer]. Provided BP cuff through charity program. Heart Failure Nurse Navigator provided scale through HF clinic. Therapy recommending outpatient PT. Patient will consider it. Gave list of local clinics. No further concerns. CSW signing off.  Final next level of care: Home/Self Care Barriers to Discharge: Barriers Resolved   Patient Goals and CMS Choice        Discharge Placement                Patient to be transferred to facility by: Friend   Patient and family notified of of transfer: 11/22/21  Discharge Plan and Services                                     Social Determinants of Health (SDOH) Interventions     Readmission Risk Interventions    11/21/2021   11:34 AM  Readmission Risk Prevention Plan  Transportation Screening Complete  PCP or Specialist Appt within 3-5 Days Complete  Social Work Consult for Recovery Care Planning/Counseling Complete  Palliative Care Screening Not Applicable  Medication Review Oceanographer) Complete

## 2021-11-22 NOTE — Progress Notes (Signed)
Caitlyn Fuller to be D/C'd Home per MD order.  Discussed prescriptions and follow up appointments with the patient. Prescriptions given to patient, medication list explained in detail. Pt verbalized understanding.  Allergies as of 11/22/2021       Reactions   Amlodipine Swelling   Gabapentin Anaphylaxis   Morphine And Related Anaphylaxis   Darvocet [propoxyphene N-acetaminophen] Hives   Toradol [ketorolac Tromethamine] Other (See Comments)   migraines   Ultram [tramadol] Hives   Vancomycin Itching        Medication List     STOP taking these medications    buprenorphine-naloxone 8-2 mg Subl SL tablet Commonly known as: SUBOXONE       TAKE these medications    aspirin EC 81 MG tablet Take 1 tablet (81 mg total) by mouth daily. Swallow whole.   atorvastatin 10 MG tablet Commonly known as: LIPITOR Take 1 tablet (10 mg total) by mouth daily.   blood glucose meter kit and supplies Kit Dispense based on patient and insurance preference. Check sugar 4 times a day before meals and bedtime   famotidine 20 MG tablet Commonly known as: PEPCID Take 1 tablet (20 mg total) by mouth daily.   furosemide 40 MG tablet Commonly known as: LASIX Take 1 tablet (40 mg total) by mouth daily.   insulin aspart 100 UNIT/ML injection Commonly known as: novoLOG 15 units subcutaneous injection prior to meals   insulin detemir 100 UNIT/ML FlexPen Commonly known as: LEVEMIR Inject 50 Units into the skin at bedtime. What changed:  how much to take when to take this   Insulin Pen Needle 33G X 5 MM Misc 1 Dose by Does not apply route 3 (three) times daily before meals.   oxyCODONE 15 MG immediate release tablet Commonly known as: ROXICODONE Take 1 tablet (15 mg total) by mouth every 6 (six) hours as needed for moderate pain or severe pain.   potassium chloride SA 20 MEQ tablet Commonly known as: KLOR-CON M Take 1 tablet (20 mEq total) by mouth daily.        Vitals:    11/21/21 2029 11/22/21 0434  BP: (!) 172/95 (!) 126/97  Pulse: 90 86  Resp: 15 15  Temp: 97.9 F (36.6 C) 98.2 F (36.8 C)  SpO2: 98% 95%    Skin clean, dry and intact without evidence of skin break down, no evidence of skin tears noted. IV catheter discontinued intact. Site without signs and symptoms of complications. Dressing and pressure applied. Pt denies pain at this time. No complaints noted.  An After Visit Summary was printed and given to the patient. Patient escorted via [place], and D/C home via private auto.  [place] C. Deatra Ina

## 2021-11-25 ENCOUNTER — Emergency Department: Payer: Medicaid Other

## 2021-11-25 ENCOUNTER — Other Ambulatory Visit: Payer: Self-pay

## 2021-11-25 ENCOUNTER — Telehealth: Payer: Self-pay

## 2021-11-25 ENCOUNTER — Inpatient Hospital Stay
Admission: EM | Admit: 2021-11-25 | Discharge: 2021-12-01 | DRG: 291 | Discharge status: Against Medical Advice | Payer: Medicaid Other | Attending: Internal Medicine | Admitting: Internal Medicine

## 2021-11-25 DIAGNOSIS — Z5329 Procedure and treatment not carried out because of patient's decision for other reasons: Secondary | ICD-10-CM | POA: Diagnosis present

## 2021-11-25 DIAGNOSIS — K219 Gastro-esophageal reflux disease without esophagitis: Secondary | ICD-10-CM | POA: Diagnosis present

## 2021-11-25 DIAGNOSIS — I5043 Acute on chronic combined systolic (congestive) and diastolic (congestive) heart failure: Secondary | ICD-10-CM | POA: Diagnosis not present

## 2021-11-25 DIAGNOSIS — E1165 Type 2 diabetes mellitus with hyperglycemia: Secondary | ICD-10-CM | POA: Diagnosis present

## 2021-11-25 DIAGNOSIS — F17210 Nicotine dependence, cigarettes, uncomplicated: Secondary | ICD-10-CM | POA: Diagnosis present

## 2021-11-25 DIAGNOSIS — J45909 Unspecified asthma, uncomplicated: Secondary | ICD-10-CM | POA: Diagnosis present

## 2021-11-25 DIAGNOSIS — E8770 Fluid overload, unspecified: Principal | ICD-10-CM

## 2021-11-25 DIAGNOSIS — G40909 Epilepsy, unspecified, not intractable, without status epilepticus: Secondary | ICD-10-CM | POA: Diagnosis present

## 2021-11-25 DIAGNOSIS — Z87892 Personal history of anaphylaxis: Secondary | ICD-10-CM

## 2021-11-25 DIAGNOSIS — I2489 Other forms of acute ischemic heart disease: Secondary | ICD-10-CM | POA: Diagnosis present

## 2021-11-25 DIAGNOSIS — Z91119 Patient's noncompliance with dietary regimen due to unspecified reason: Secondary | ICD-10-CM

## 2021-11-25 DIAGNOSIS — R739 Hyperglycemia, unspecified: Secondary | ICD-10-CM

## 2021-11-25 DIAGNOSIS — G8929 Other chronic pain: Secondary | ICD-10-CM | POA: Diagnosis present

## 2021-11-25 DIAGNOSIS — Z9851 Tubal ligation status: Secondary | ICD-10-CM

## 2021-11-25 DIAGNOSIS — Z885 Allergy status to narcotic agent status: Secondary | ICD-10-CM

## 2021-11-25 DIAGNOSIS — Z881 Allergy status to other antibiotic agents status: Secondary | ICD-10-CM

## 2021-11-25 DIAGNOSIS — W19XXXA Unspecified fall, initial encounter: Secondary | ICD-10-CM | POA: Diagnosis present

## 2021-11-25 DIAGNOSIS — Z79899 Other long term (current) drug therapy: Secondary | ICD-10-CM

## 2021-11-25 DIAGNOSIS — R0609 Other forms of dyspnea: Secondary | ICD-10-CM

## 2021-11-25 DIAGNOSIS — Z8673 Personal history of transient ischemic attack (TIA), and cerebral infarction without residual deficits: Secondary | ICD-10-CM

## 2021-11-25 DIAGNOSIS — Z8249 Family history of ischemic heart disease and other diseases of the circulatory system: Secondary | ICD-10-CM

## 2021-11-25 DIAGNOSIS — I5033 Acute on chronic diastolic (congestive) heart failure: Secondary | ICD-10-CM | POA: Diagnosis present

## 2021-11-25 DIAGNOSIS — S0083XA Contusion of other part of head, initial encounter: Secondary | ICD-10-CM | POA: Diagnosis present

## 2021-11-25 DIAGNOSIS — K5900 Constipation, unspecified: Secondary | ICD-10-CM | POA: Diagnosis present

## 2021-11-25 DIAGNOSIS — R0683 Snoring: Secondary | ICD-10-CM | POA: Diagnosis present

## 2021-11-25 DIAGNOSIS — R601 Generalized edema: Secondary | ICD-10-CM

## 2021-11-25 DIAGNOSIS — N179 Acute kidney failure, unspecified: Secondary | ICD-10-CM | POA: Diagnosis present

## 2021-11-25 DIAGNOSIS — Z888 Allergy status to other drugs, medicaments and biological substances status: Secondary | ICD-10-CM

## 2021-11-25 DIAGNOSIS — E785 Hyperlipidemia, unspecified: Secondary | ICD-10-CM | POA: Diagnosis present

## 2021-11-25 DIAGNOSIS — I110 Hypertensive heart disease with heart failure: Principal | ICD-10-CM | POA: Diagnosis present

## 2021-11-25 DIAGNOSIS — I10 Essential (primary) hypertension: Secondary | ICD-10-CM

## 2021-11-25 DIAGNOSIS — I5030 Unspecified diastolic (congestive) heart failure: Secondary | ICD-10-CM

## 2021-11-25 DIAGNOSIS — G894 Chronic pain syndrome: Secondary | ICD-10-CM | POA: Diagnosis present

## 2021-11-25 DIAGNOSIS — Z6841 Body Mass Index (BMI) 40.0 and over, adult: Secondary | ICD-10-CM

## 2021-11-25 DIAGNOSIS — R0601 Orthopnea: Secondary | ICD-10-CM

## 2021-11-25 DIAGNOSIS — Z794 Long term (current) use of insulin: Secondary | ICD-10-CM

## 2021-11-25 HISTORY — DX: Morbid (severe) obesity due to excess calories: E66.01

## 2021-11-25 HISTORY — DX: Chronic diastolic (congestive) heart failure: I50.32

## 2021-11-25 LAB — BASIC METABOLIC PANEL
Anion gap: 7 (ref 5–15)
BUN: 21 mg/dL — ABNORMAL HIGH (ref 6–20)
CO2: 30 mmol/L (ref 22–32)
Calcium: 8.3 mg/dL — ABNORMAL LOW (ref 8.9–10.3)
Chloride: 97 mmol/L — ABNORMAL LOW (ref 98–111)
Creatinine, Ser: 1.01 mg/dL — ABNORMAL HIGH (ref 0.44–1.00)
GFR, Estimated: >60 mL/min (ref >60)
Glucose, Bld: 397 mg/dL — ABNORMAL HIGH (ref 70–99)
Potassium: 4.4 mmol/L (ref 3.5–5.1)
Sodium: 134 mmol/L — ABNORMAL LOW (ref 135–145)

## 2021-11-25 LAB — GLUCOSE, CAPILLARY: Glucose-Capillary: 431 mg/dL — ABNORMAL HIGH (ref 70–99)

## 2021-11-25 LAB — CBC
HCT: 37.9 % (ref 36.0–46.0)
Hemoglobin: 11.8 g/dL — ABNORMAL LOW (ref 12.0–15.0)
MCH: 25.5 pg — ABNORMAL LOW (ref 26.0–34.0)
MCHC: 31.1 g/dL (ref 30.0–36.0)
MCV: 81.9 fL (ref 80.0–100.0)
Platelets: 287 10*3/uL (ref 150–400)
RBC: 4.63 MIL/uL (ref 3.87–5.11)
RDW: 13.5 % (ref 11.5–15.5)
WBC: 12.2 10*3/uL — ABNORMAL HIGH (ref 4.0–10.5)
nRBC: 0 % (ref 0.0–0.2)

## 2021-11-25 LAB — TROPONIN I (HIGH SENSITIVITY)
Troponin I (High Sensitivity): 19 ng/L — ABNORMAL HIGH (ref <18)
Troponin I (High Sensitivity): 20 ng/L — ABNORMAL HIGH (ref <18)

## 2021-11-25 LAB — BRAIN NATRIURETIC PEPTIDE: B Natriuretic Peptide: 192.6 pg/mL — ABNORMAL HIGH (ref 0.0–100.0)

## 2021-11-25 MED ORDER — ASPIRIN 81 MG PO TBEC
81.0000 mg | DELAYED_RELEASE_TABLET | Freq: Every day | ORAL | Status: DC
  Administered 2021-11-25 – 2021-12-01 (×7): 81 mg via ORAL
  Filled 2021-11-25 (×7): qty 1

## 2021-11-25 MED ORDER — ATORVASTATIN CALCIUM 10 MG PO TABS
10.0000 mg | ORAL_TABLET | Freq: Every day | ORAL | Status: DC
  Administered 2021-11-25 – 2021-12-01 (×7): 10 mg via ORAL
  Filled 2021-11-25 (×7): qty 1

## 2021-11-25 MED ORDER — IBUPROFEN 600 MG PO TABS
600.0000 mg | ORAL_TABLET | Freq: Once | ORAL | Status: AC
  Administered 2021-11-25: 600 mg via ORAL
  Filled 2021-11-25: qty 1

## 2021-11-25 MED ORDER — ALBUTEROL SULFATE (2.5 MG/3ML) 0.083% IN NEBU: 2.5000 mg | INHALATION_SOLUTION | RESPIRATORY_TRACT | Status: DC | PRN

## 2021-11-25 MED ORDER — FAMOTIDINE 20 MG PO TABS
20.0000 mg | ORAL_TABLET | Freq: Every day | ORAL | Status: DC
  Administered 2021-11-25 – 2021-12-01 (×7): 20 mg via ORAL
  Filled 2021-11-25 (×7): qty 1

## 2021-11-25 MED ORDER — FUROSEMIDE 10 MG/ML IJ SOLN
40.0000 mg | Freq: Two times a day (BID) | INTRAMUSCULAR | Status: DC
  Administered 2021-11-26: 40 mg via INTRAVENOUS
  Filled 2021-11-25: qty 4

## 2021-11-25 MED ORDER — INSULIN ASPART 100 UNIT/ML IJ SOLN
10.0000 [IU] | Freq: Three times a day (TID) | INTRAMUSCULAR | Status: DC
  Administered 2021-11-26 – 2021-11-29 (×9): 10 [IU] via SUBCUTANEOUS
  Filled 2021-11-25 (×10): qty 1

## 2021-11-25 MED ORDER — INSULIN ASPART 100 UNIT/ML IJ SOLN
0.0000 [IU] | Freq: Three times a day (TID) | INTRAMUSCULAR | Status: DC
  Administered 2021-11-26: 7 [IU] via SUBCUTANEOUS
  Administered 2021-11-26: 11 [IU] via SUBCUTANEOUS
  Administered 2021-11-27: 15 [IU] via SUBCUTANEOUS
  Administered 2021-11-27: 7 [IU] via SUBCUTANEOUS
  Administered 2021-11-27: 15 [IU] via SUBCUTANEOUS
  Administered 2021-11-28 (×2): 7 [IU] via SUBCUTANEOUS
  Administered 2021-11-28 – 2021-11-29 (×2): 4 [IU] via SUBCUTANEOUS
  Administered 2021-11-29 (×2): 7 [IU] via SUBCUTANEOUS
  Administered 2021-11-30 – 2021-12-01 (×3): 11 [IU] via SUBCUTANEOUS
  Filled 2021-11-25 (×10): qty 1

## 2021-11-25 MED ORDER — ACETAMINOPHEN 500 MG PO TABS
1000.0000 mg | ORAL_TABLET | Freq: Once | ORAL | Status: AC
  Administered 2021-11-25: 1000 mg via ORAL
  Filled 2021-11-25: qty 2

## 2021-11-25 MED ORDER — FUROSEMIDE 10 MG/ML IJ SOLN
40.0000 mg | Freq: Once | INTRAMUSCULAR | Status: AC
  Administered 2021-11-25: 40 mg via INTRAVENOUS
  Filled 2021-11-25: qty 4

## 2021-11-25 MED ORDER — FUROSEMIDE 40 MG PO TABS: 40.0000 mg | ORAL_TABLET | Freq: Every day | ORAL | Status: DC

## 2021-11-25 MED ORDER — OXYCODONE HCL 5 MG PO TABS
10.0000 mg | ORAL_TABLET | Freq: Four times a day (QID) | ORAL | Status: DC | PRN
  Administered 2021-11-25 – 2021-11-28 (×10): 10 mg via ORAL
  Filled 2021-11-25 (×10): qty 2

## 2021-11-25 MED ORDER — ENOXAPARIN SODIUM 40 MG/0.4ML IJ SOSY
40.0000 mg | PREFILLED_SYRINGE | INTRAMUSCULAR | Status: DC
  Administered 2021-11-25: 40 mg via SUBCUTANEOUS
  Filled 2021-11-25 (×3): qty 0.4

## 2021-11-25 MED ORDER — INSULIN DETEMIR 100 UNIT/ML ~~LOC~~ SOLN
50.0000 [IU] | Freq: Every day | SUBCUTANEOUS | Status: DC
  Administered 2021-11-25 – 2021-11-27 (×3): 50 [IU] via SUBCUTANEOUS
  Filled 2021-11-25 (×3): qty 0.5

## 2021-11-25 NOTE — ED Notes (Signed)
Called dietary to request meal tray per pt request

## 2021-11-25 NOTE — ED Provider Notes (Addendum)
[facility] Provider Note    Event Date/Time   First MD Initiated Contact with Patient 11/25/21 1750     (approximate)   History   Shortness of Breath   HPI  Caitlyn Fuller is a 46 y.o. female   Past medical history of diabetes, CHF, asthma, chronic pain, substance use who presents to the emergency department with shortness of breath and edema in her arms, abdomen, bilateral legs ever since being discharged from the hospital earlier this week for CHF exacerbation.  She says she has been taking all her medications as prescribed including her Lasix but despite that she has gained over 30 pounds, per patient report since her discharge.    Denies cough or fever.  Denies chest pain.  She did have a fall last week when she struck her head and has some bruising residually under her right eye.  History was obtained via the patient as well as a review of external medical notes including discharge summary dated 11/22/2021 for CHF exacerbation and hyperglycemia.      Physical Exam   Triage Vital Signs: ED Triage Vitals  Enc Vitals Group     BP 11/25/21 1631 (!) 178/93     Pulse Rate 11/25/21 1631 84     Resp 11/25/21 1631 19     Temp 11/25/21 1631 97.8 F (36.6 C)     Temp src --      SpO2 11/25/21 1631 96 %     Weight --      Height --      Head Circumference --      Peak Flow --      Pain Score 11/25/21 1630 10     Pain Loc --      Pain Edu? --      Excl. in GC? --     Most recent vital signs: Vitals:   11/25/21 1631  BP: (!) 178/93  Pulse: 84  Resp: 19  Temp: 97.8 F (36.6 C)  SpO2: 96%    General: Awake, no distress.  CV:  Good peripheral perfusion.  Resp:  Normal effort.  Abd:  No distention.  Other:  Lung sounds distant but no obvious wheezing or focality or rales.  She does have significant edema to arms legs and abdomen.  No hypoxemia on room air.  She is hypertensive 170s over 90s.  Afebrile.  Appears comfortable.   ED  Results / Procedures / Treatments   Labs (all labs ordered are listed, but only abnormal results are displayed) Labs Reviewed  BASIC METABOLIC PANEL - Abnormal; Notable for the following components:      Result Value   Sodium 134 (*)    Chloride 97 (*)    Glucose, Bld 397 (*)    BUN 21 (*)    Creatinine, Ser 1.01 (*)    Calcium 8.3 (*)    All other components within normal limits  CBC - Abnormal; Notable for the following components:   WBC 12.2 (*)    Hemoglobin 11.8 (*)    MCH 25.5 (*)    All other components within normal limits  BRAIN NATRIURETIC PEPTIDE - Abnormal; Notable for the following components:   B Natriuretic Peptide 192.6 (*)    All other components within normal limits  TROPONIN I (HIGH SENSITIVITY) - Abnormal; Notable for the following components:   Troponin I (High Sensitivity) 19 (*)    All other components within normal limits  TROPONIN I (HIGH SENSITIVITY)  I reviewed labs and they are notable for BNP 192 white blood cell count 12.2 and a troponin of 19 which is less than prior hospitalization.  She is hyperglycemic 397 and a creatinine of 1.01 without an anion gap.  EKG  ED ECG REPORT I, Pilar Jarvis, the attending physician, personally viewed and interpreted this ECG.   Date: 11/25/2021  EKG Time: 1636  Rate: 83  Rhythm: normal sinus rhythm  Axis: nl  Intervals:none  ST&T Change: No acute ischemic changes    RADIOLOGY I independently reviewed and interpreted chest x-ray and see no pneumothorax.   PROCEDURES:  Critical Care performed: No  Procedures   MEDICATIONS ORDERED IN ED: Medications  furosemide (LASIX) injection 40 mg (has no administration in time range)  acetaminophen (TYLENOL) tablet 1,000 mg (has no administration in time range)  ibuprofen (ADVIL) tablet 600 mg (has no administration in time range)    Consultants:  I spoke with hospitalist for admission & regarding care plan for this patient.   IMPRESSION / MDM /  ASSESSMENT AND PLAN / ED COURSE  I reviewed the triage vital signs and the nursing notes.                              Differential diagnosis includes, but is not limited to, HF exacerbation, ACS, viral and bacterial pneumonia, hyperglycemia, DKA, deconditioning, asthma exacerbation, PE.  MDM: This is a patient with CHF, diabetes, substance use who presents to the emergency department with significant edematous changes since being discharged for [facility] exacerbation 3 days ago with exertional dyspnea and orthopnea clinical picture is most consistent with fluid overload.  She denies chest pain and has no ischemic changes on EKG so I doubt she has ACS.  Doubt PE given more likely explanation for her dyspnea is fluid overload.  She is hyperglycemic but not in DKA, alert and oriented and mentating well.  She has no other focal complaints.  Remote injuries to the hands and face from a fall approximately 1 week ago, no anticoagulation or loss of consciousness no bony tenderness full active range of motion doubt fractures, internal bleeding.  Given the extent of her peripheral edema and symptomatic with minimal exertion, I doubt I will be able to diuresis in the emergency department with good effect for discharge.  Plan for admission for CHF exacerbation and diuresis, management of hyperglycemia.   Patient's presentation is most consistent with acute presentation with potential threat to life or bodily function.       FINAL CLINICAL IMPRESSION(S) / ED DIAGNOSES   Final diagnoses:  Hypervolemia, unspecified hypervolemia type  Exertional dyspnea  Orthopnea  Hyperglycemia     Rx / DC Orders   ED Discharge Orders     None        Note:  This document was prepared using Dragon voice recognition software and may include unintentional dictation errors.    Pilar Jarvis, MD 11/25/21 Zollie Pee    Pilar Jarvis, MD 11/25/21 Rickey Primus

## 2021-11-25 NOTE — ED Notes (Signed)
Pt up to restroom.

## 2021-11-25 NOTE — Telephone Encounter (Signed)
   Post Hospital Discharge follow up phone call  Originally called the phone number of [phone], since the patient's daughters [facility] phone number.  The daughter request that he change the primary phone number [phone] as that has her mother's cell phone.   Called and spoke with Kylynn.  She states that she is not doing very well since she is gone home from the hospital she states when she went home she originally weighed 214 then yesterday was 228 and now today is 243.  He states that she is swollen all over her feet legs and stomach even her face she feels the swollen.  She is waiting for her daughter to come pick her up to take her to the emergency room.  Tresa Endo RN [facility]

## 2021-11-25 NOTE — ED Notes (Signed)
Attending Acheampong P. Notified via secure chat about pt's pain and her request for home PRN pain meds. Awaiting orders.

## 2021-11-25 NOTE — ED Triage Notes (Signed)
Pt comes with c/o increased SOB. Pt states recently dx with CHF. Pt states bilateral leg swelling.

## 2021-11-25 NOTE — H&P (Signed)
History and Physical    Patient: Caitlyn Fuller SWN:[id number] DOB: 1975-03-17 DOA: 11/25/2021 DOS: the patient was seen and examined on 11/25/2021 PCP: [facility] [employer]  Patient coming from: Home  Chief Complaint: I have worsening shortness of breath and lower extremity edema since discharge Chief Complaint  Patient presents with   Shortness of Breath   HPI: Caitlyn Fuller is a 46 y.o. female with medical history significant of chronic pain syndrome, obesity, poorly controlled diabetes mellitus, asthma who was recently admitted and treated for fluid overload secondary to grade 2 diastolic CHF.  As per patient, she was discharged on p.o. Lasix and has been compliant with medication.  She however has noted worsening lower extremity edema, abdominal distention and weight gain.  She decided to come to the emergency room to be further evaluated.  She admits to drinking about 2 L of water per day.  She is yet to make appointment to cardiology clinic.  She admits to paroxysmal nocturnal dyspnea and orthopnea but denied any chest pain or palpitations.  She denies any abdominal pain, nausea or vomiting at this time.   Review of Systems: As mentioned in the history of present illness. All other systems reviewed and are negative. Past Medical History:  Diagnosis Date   Asthma    Diabetes mellitus without complication ([place])    Hernia    Hypertension    Kidney infection    Shingles    Past Surgical History:  Procedure Laterality Date   ABDOMINAL SURGERY     CESAREAN SECTION     CHOLECYSTECTOMY     colonscopy     TUBAL LIGATION     Social History:  reports that she has been smoking cigarettes. She has been smoking an average of .5 packs per day. She has never used smokeless tobacco. She reports current drug use. Drug: "Crack" cocaine. She reports that she does not drink alcohol.  Allergies  Allergen Reactions   Amlodipine Swelling   Gabapentin Anaphylaxis   Morphine  And Related Anaphylaxis   Darvocet [Propoxyphene N-Acetaminophen] Hives   Toradol [Ketorolac Tromethamine] Other (See Comments)    migraines   Ultram [Tramadol] Hives   Vancomycin Itching    Family History  Problem Relation Age of Onset   Cancer Mother    Heart failure Father     Prior to Admission medications   Medication Sig Start Date End Date Taking? Authorizing Provider  aspirin EC 81 MG tablet Take 1 tablet (81 mg total) by mouth daily. Swallow whole. 11/22/21   Loletha Grayer, MD  atorvastatin (LIPITOR) 10 MG tablet Take 1 tablet (10 mg total) by mouth daily. 11/22/21   Loletha Grayer, MD  blood glucose meter kit and supplies KIT Dispense based on patient and insurance preference. Check sugar 4 times a day before meals and bedtime 11/22/21   Loletha Grayer, MD  famotidine (PEPCID) 20 MG tablet Take 1 tablet (20 mg total) by mouth daily. 11/22/21   Loletha Grayer, MD  furosemide (LASIX) 40 MG tablet Take 1 tablet (40 mg total) by mouth daily. 11/22/21   Loletha Grayer, MD  insulin aspart (NOVOLOG) 100 UNIT/ML injection 15 units subcutaneous injection prior to meals 11/22/21   Loletha Grayer, MD  insulin detemir (LEVEMIR) 100 UNIT/ML FlexPen Inject 50 Units into the skin at bedtime. 11/22/21   Loletha Grayer, MD  Insulin Pen Needle 33G X 5 MM MISC 1 Dose by Does not apply route 3 (three) times daily before meals. 11/22/21  Loletha Grayer, MD  oxyCODONE (ROXICODONE) 15 MG immediate release tablet Take 1 tablet (15 mg total) by mouth every 6 (six) hours as needed for moderate pain or severe pain. 11/22/21   Loletha Grayer, MD  potassium chloride SA (KLOR-CON M) 20 MEQ tablet Take 1 tablet (20 mEq total) by mouth daily. 11/22/21   Loletha Grayer, MD    Physical Exam: Vitals:   11/25/21 1631 11/25/21 1951  BP: (!) 178/93 (!) 161/93  Pulse: 84 93  Resp: 19 19  Temp: 97.8 F (36.6 C)   SpO2: 96% 97%   General: Patient is a centrally obese female.  Not in any  acute respiratory distress at this time.  Head is atraumatic.  Neck Short but supple HEENT: Oral mucosa moist, pharynx could not be visualized. Neck: Supple Chest: Clinically diminished bilaterally Abdomen rotund distended.  Periumbilical hernia noted. Extremities: Significant for bilateral lower extremity edema,which is pitting. CNS: No focal deficits appreciated Skin: Negative for any new rash  Data Reviewed:  Sodium is 134, potassium 4.4, chloride 97, glucose 397, BUN 21, creatinine 1.01, BNP 192, WBC 12.2, hemoglobin 11.8, hematocrit 38, platelet 287.  Chest x-ray shows no active cardiopulmonary disease or vascular congestion.  Assessment and Plan: 46 year old with grade 2 diastolic CHF who presents on account of worsening lower extremity edema abdominal distention suggesting anasarca.  Patient has been on p.o. Lasix 40 mg daily.  Acute on chronic diastolic CHF (congestive heart failure) ([place]) Failed outpatient treatment with p.o. Lasix 40 mg.  Interval weight gain as reported by patient.  As per the discharge summary, patient had declined beta-blocker and ARB and was referred to CHF clinic.  It is unclear if patient has been compliant with fluid restriction as she reports drinking about 2 L/day.  Patient will be initiated on IV Lasix 40 mg twice daily.  Urinalysis from prior does not suggest protein-losing nephropathy.  Anasarca: Secondary to #1.  Treatment as noted above.  Diabetes mellitus: On insulin therapy.  Poorly controlled.  Last A1c greater than 12. Continue Levemir at 50 units premeal NovoLog 10 units.  Fingerstick glucose monitoring as per protocol.  Insulin sliding scale will be instituted.  Hypertension: On admission likely secondary to pain.  Patient declined to have any antihypertensive medications at this time besides Lasix.   Chronic pain syndrome: Patient has been on oxycodone.  We will restart at as needed every 6 hourly.   Obesity, Class III, BMI 40-49.9 (morbid  obesity) ([place]): Lifestyle modification was reemphasized.   GERD (gastroesophageal reflux disease): Resume Pepcid  Hyperlipidemia:Low-dose Lipitor prescribed    Advance Care Planning:   Code Status: Full Code   Consults: None.  Cardiology may be considered if symptoms does not improve.  Family Communication: No family at bedside at this time.  Severity of Illness: The appropriate patient status for this patient is OBSERVATION. Observation status is judged to be reasonable and necessary in order to provide the required intensity of service to ensure the patient's safety. The patient's presenting symptoms, physical exam findings, and initial radiographic and laboratory data in the context of their medical condition is felt to place them at decreased risk for further clinical deterioration. Furthermore, it is anticipated that the patient will be medically stable for discharge from the hospital within 2 midnights of admission.   Author: Artist Beach, MD 11/25/2021 8:28 PM  For on call review [email or web address].

## 2021-11-26 ENCOUNTER — Observation Stay: Payer: Medicaid Other

## 2021-11-26 DIAGNOSIS — J45909 Unspecified asthma, uncomplicated: Secondary | ICD-10-CM | POA: Diagnosis present

## 2021-11-26 DIAGNOSIS — R601 Generalized edema: Secondary | ICD-10-CM

## 2021-11-26 DIAGNOSIS — K5900 Constipation, unspecified: Secondary | ICD-10-CM | POA: Diagnosis present

## 2021-11-26 DIAGNOSIS — Z794 Long term (current) use of insulin: Secondary | ICD-10-CM

## 2021-11-26 DIAGNOSIS — E1165 Type 2 diabetes mellitus with hyperglycemia: Secondary | ICD-10-CM

## 2021-11-26 DIAGNOSIS — K219 Gastro-esophageal reflux disease without esophagitis: Secondary | ICD-10-CM | POA: Diagnosis present

## 2021-11-26 DIAGNOSIS — I5033 Acute on chronic diastolic (congestive) heart failure: Secondary | ICD-10-CM | POA: Diagnosis present

## 2021-11-26 DIAGNOSIS — N179 Acute kidney failure, unspecified: Secondary | ICD-10-CM | POA: Diagnosis present

## 2021-11-26 DIAGNOSIS — S0083XA Contusion of other part of head, initial encounter: Secondary | ICD-10-CM | POA: Diagnosis present

## 2021-11-26 DIAGNOSIS — Z5329 Procedure and treatment not carried out because of patient's decision for other reasons: Secondary | ICD-10-CM | POA: Diagnosis present

## 2021-11-26 DIAGNOSIS — I2489 Other forms of acute ischemic heart disease: Secondary | ICD-10-CM | POA: Diagnosis present

## 2021-11-26 DIAGNOSIS — G894 Chronic pain syndrome: Secondary | ICD-10-CM

## 2021-11-26 DIAGNOSIS — Z79899 Other long term (current) drug therapy: Secondary | ICD-10-CM | POA: Diagnosis not present

## 2021-11-26 DIAGNOSIS — Z6841 Body Mass Index (BMI) 40.0 and over, adult: Secondary | ICD-10-CM | POA: Diagnosis not present

## 2021-11-26 DIAGNOSIS — E785 Hyperlipidemia, unspecified: Secondary | ICD-10-CM

## 2021-11-26 DIAGNOSIS — G40909 Epilepsy, unspecified, not intractable, without status epilepticus: Secondary | ICD-10-CM | POA: Diagnosis present

## 2021-11-26 DIAGNOSIS — I5030 Unspecified diastolic (congestive) heart failure: Secondary | ICD-10-CM | POA: Diagnosis not present

## 2021-11-26 DIAGNOSIS — I5043 Acute on chronic combined systolic (congestive) and diastolic (congestive) heart failure: Secondary | ICD-10-CM | POA: Diagnosis not present

## 2021-11-26 DIAGNOSIS — R0601 Orthopnea: Secondary | ICD-10-CM | POA: Diagnosis present

## 2021-11-26 DIAGNOSIS — E1169 Type 2 diabetes mellitus with other specified complication: Secondary | ICD-10-CM | POA: Diagnosis not present

## 2021-11-26 DIAGNOSIS — Z8673 Personal history of transient ischemic attack (TIA), and cerebral infarction without residual deficits: Secondary | ICD-10-CM | POA: Diagnosis not present

## 2021-11-26 DIAGNOSIS — R0683 Snoring: Secondary | ICD-10-CM | POA: Diagnosis present

## 2021-11-26 DIAGNOSIS — F17210 Nicotine dependence, cigarettes, uncomplicated: Secondary | ICD-10-CM | POA: Diagnosis present

## 2021-11-26 DIAGNOSIS — I110 Hypertensive heart disease with heart failure: Secondary | ICD-10-CM | POA: Diagnosis present

## 2021-11-26 DIAGNOSIS — I10 Essential (primary) hypertension: Secondary | ICD-10-CM | POA: Diagnosis not present

## 2021-11-26 DIAGNOSIS — Z91119 Patient's noncompliance with dietary regimen due to unspecified reason: Secondary | ICD-10-CM | POA: Diagnosis not present

## 2021-11-26 DIAGNOSIS — Z9851 Tubal ligation status: Secondary | ICD-10-CM | POA: Diagnosis not present

## 2021-11-26 DIAGNOSIS — Z87892 Personal history of anaphylaxis: Secondary | ICD-10-CM | POA: Diagnosis not present

## 2021-11-26 DIAGNOSIS — Z881 Allergy status to other antibiotic agents status: Secondary | ICD-10-CM | POA: Diagnosis not present

## 2021-11-26 DIAGNOSIS — W19XXXA Unspecified fall, initial encounter: Secondary | ICD-10-CM | POA: Diagnosis present

## 2021-11-26 LAB — CBC
HCT: 36.3 % (ref 36.0–46.0)
Hemoglobin: 11.4 g/dL — ABNORMAL LOW (ref 12.0–15.0)
MCH: 25.6 pg — ABNORMAL LOW (ref 26.0–34.0)
MCHC: 31.4 g/dL (ref 30.0–36.0)
MCV: 81.4 fL (ref 80.0–100.0)
Platelets: 267 10*3/uL (ref 150–400)
RBC: 4.46 MIL/uL (ref 3.87–5.11)
RDW: 13.6 % (ref 11.5–15.5)
WBC: 10.2 10*3/uL (ref 4.0–10.5)
nRBC: 0 % (ref 0.0–0.2)

## 2021-11-26 LAB — BASIC METABOLIC PANEL
Anion gap: 5 (ref 5–15)
BUN: 22 mg/dL — ABNORMAL HIGH (ref 6–20)
CO2: 29 mmol/L (ref 22–32)
Calcium: 8 mg/dL — ABNORMAL LOW (ref 8.9–10.3)
Chloride: 102 mmol/L (ref 98–111)
Creatinine, Ser: 0.71 mg/dL (ref 0.44–1.00)
GFR, Estimated: >60 mL/min (ref >60)
Glucose, Bld: 307 mg/dL — ABNORMAL HIGH (ref 70–99)
Potassium: 4.1 mmol/L (ref 3.5–5.1)
Sodium: 136 mmol/L (ref 135–145)

## 2021-11-26 LAB — GLUCOSE, CAPILLARY
Glucose-Capillary: 203 mg/dL — ABNORMAL HIGH (ref 70–99)
Glucose-Capillary: 116 mg/dL — ABNORMAL HIGH (ref 70–99)
Glucose-Capillary: 284 mg/dL — ABNORMAL HIGH (ref 70–99)
Glucose-Capillary: 225 mg/dL — ABNORMAL HIGH (ref 70–99)

## 2021-11-26 MED ORDER — POTASSIUM CHLORIDE CRYS ER 20 MEQ PO TBCR
40.0000 meq | EXTENDED_RELEASE_TABLET | Freq: Two times a day (BID) | ORAL | Status: DC
  Administered 2021-11-26 – 2021-12-01 (×11): 40 meq via ORAL
  Filled 2021-11-26 (×11): qty 2

## 2021-11-26 MED ORDER — METOLAZONE 5 MG PO TABS
5.0000 mg | ORAL_TABLET | Freq: Once | ORAL | Status: AC
  Administered 2021-11-26: 5 mg via ORAL
  Filled 2021-11-26: qty 1

## 2021-11-26 MED ORDER — ORAL CARE MOUTH RINSE: 15.0000 mL | OROMUCOSAL | Status: DC | PRN

## 2021-11-26 MED ORDER — FUROSEMIDE 10 MG/ML IJ SOLN
6.0000 mg/h | INTRAVENOUS | Status: DC
  Administered 2021-11-26: 8 mg/h via INTRAVENOUS
  Filled 2021-11-26 (×2): qty 20

## 2021-11-26 MED ORDER — MAGNESIUM OXIDE -MG SUPPLEMENT 400 (240 MG) MG PO TABS
400.0000 mg | ORAL_TABLET | Freq: Two times a day (BID) | ORAL | Status: DC
  Administered 2021-11-26 – 2021-12-01 (×10): 400 mg via ORAL
  Filled 2021-11-26 (×10): qty 1

## 2021-11-26 NOTE — Progress Notes (Signed)
  Progress Note   Patient: Caitlyn Fuller:[id number] DOB: Jun 23, 1975 DOA: 11/25/2021     0 DOS: the patient was seen and examined on 11/26/2021   Brief hospital course: 46 year old female with past medical history of diastolic congestive function, morbid obesity, type 2 diabetes mellitus with hyperglycemia, hypertension, chronic pain presents back to the hospital after gaining a lot of weight back.  Assessment and Plan: Acute on chronic diastolic CHF (congestive heart failure) (HCC) Patient gained weight up to 110 kg.  She was down to 103 kg scale today.  She states that her normal range today is around 214 pounds which would be about 97 kg.  We will change Lasix to Lasix drip.  Given a dose of Zaroxolyn this morning.  Last admission, she did not tolerate ACE inhibitor or beta-blocker.  Anasarca Diuresed with IV Lasix drip.  Uncontrolled type 2 diabetes mellitus with hyperglycemia, with long-term current use of insulin (HCC) Last hemoglobin A1c elevated at 12.5.  Sugars elevated here.  Continue 50 units of insulin plus sliding scale insulin with short acting insulin prior to meals.  Hyperlipidemia Continue Lipitor  Obesity, Class III, BMI 40-49.9 (morbid obesity) (HCC) Current BMI 49.06  Chronic pain Continue oxycodone.  Uses Suboxone at home.        Subjective: Patient states that she gained the weight right back.  She also notes bruising around the right arm where she was leaning over to try to pick up something and ended up hitting her head.  Physical Exam: Vitals:   11/26/21 0000 11/26/21 0218 11/26/21 0419 11/26/21 0750  BP: (!) 167/91  136/74 123/85  Pulse:   85 85  Resp:    12  Temp: 98.1 F (36.7 C)  98.5 F (36.9 C) 97.9 F (36.6 C)  TempSrc: Oral  Oral   SpO2: 100%  100% 97%  Weight:  110.2 kg    Height:  4\' 11"  (1.499 m)     Physical Exam HENT:     Head: Normocephalic.  Eyes:     General: Lids are normal.     Conjunctiva/sclera: Conjunctivae  normal.  Cardiovascular:     Rate and Rhythm: Normal rate and regular rhythm.     Heart sounds: Normal heart sounds, S1 normal and S2 normal.  Pulmonary:     Breath sounds: Examination of the right-lower field reveals decreased breath sounds. Examination of the left-lower field reveals decreased breath sounds. Decreased breath sounds present. No wheezing, rhonchi or rales.  Abdominal:     Palpations: Abdomen is soft.     Tenderness: There is no abdominal tenderness.  Musculoskeletal:     Right lower leg: Swelling present.     Left lower leg: Swelling present.  Skin:    General: Skin is warm.     Comments: Presenting underneath right eye.  Neurological:     Mental Status: She is alert and oriented to person, place, and time.     Data Reviewed: Creatinine 0.7, troponin 19 and 20, ANA negative rheumatoid factor 11.7, hemoglobin 11.4, white blood cell count 10.2, platelet count 267 Echocardiogram showed an EF of 60% Altered sound of the abdomen did not show any ascites.    Disposition: Status is: Changed to inpatient since I started Lasix drip.  Planned Discharge Destination: Home    Time spent: 28 minutes  Author: , MD 11/26/2021 4:10 PM  For on call review [email or web address].

## 2021-11-26 NOTE — Inpatient Diabetes Management (Signed)
Inpatient Diabetes Program Recommendations  AACE/ADA: New Consensus Statement on Inpatient Glycemic Control (2015)  Target Ranges:  Prepandial:   less than 140 mg/dL      Peak postprandial:   less than 180 mg/dL (1-2 hours)      Critically ill patients:  140 - 180 mg/dL   Lab Results  Component Value Date   GLUCAP 203 (H) 11/26/2021   HGBA1C 12.5 (H) 11/17/2021    Review of Glycemic Control  Latest Reference Range & Units 11/25/21 21:08 11/26/21 07:48  Glucose-Capillary 70 - 99 mg/dL 161 (H) 096 (H)   Diabetes history: DM 2 Outpatient Diabetes medications: Levemir 50 units qhs, Novolog 15 units tid Current orders for Inpatient glycemic control:  Levemir 50 units qhs Novolog 0-20 units tid  Novolog 10 units tid meal coverage  A1c 12.5% on 11/9, last admission  Patient well known to DM coordinators from past admissions with discussions regarding medications ($0 copay), elevated A1c and nutrition education on 09/27/21 and 09/29/17. Patient had been on steroids in the past also. Pt recently discharged on 11/14.  Will follow glucose trends on current regimen.  Thanks,  Christena Deem RN, MSN, BC-ADM Inpatient Diabetes Coordinator Team Pager [phone] (8a-5p)

## 2021-11-26 NOTE — Assessment & Plan Note (Addendum)
Patient gained weight up to 110 kg.  She was down to 103kg on last admission..  She states that her normal weight is around 214 pounds which would be about 97 kg.  Todays weight is 106.4.  With acute kidney injury, renal discontinued Lasix drip and started on oral torsemide twice a day on 11/20.  1 dose of IV Diamox.. Last hospitalization did not tolerate ARB or beta-blocker.  Nursing staff states that she is noncompliant with fluid restriction.  Stressed the importance of fluid restriction.

## 2021-11-26 NOTE — Assessment & Plan Note (Signed)
-  Continue Lipitor °

## 2021-11-26 NOTE — Assessment & Plan Note (Addendum)
Last hemoglobin A1c elevated at 12.5.  Sugars elevated here.  Increase Semglee insulin to 55 units nightly.  Continue sliding scale insulin with short acting insulin prior to meals.

## 2021-11-26 NOTE — TOC Initial Note (Signed)
Transition of Care [facility]) - Initial/Assessment Note    Patient Details  Name: Caitlyn Fuller MRN: [id number] Date of Birth: July 30, 1975  Transition [facility] Care [facility]) CM/SW Contact:    Darolyn Rua, LCSW Phone Number: 11/26/2021, 9:11 AM  Clinical Narrative:                  CSW notes patient recently discharged 5 days ago. Per chart review last admission patient received information on applying for disability. Printed off information for [employer] and wrote down contact information for the [employer]. Patient was also provided BP cuff through charity program. Heart Failure Nurse Navigator provided scale through HF clinic. Therapy recommending outpatient PT. Last admission patient reported she would consider it. Was given list of local clinics.   Please consult TOC should additional needs arise.     Expected Discharge Plan: Home/Self Care Barriers to Discharge: Continued Medical Work up   Patient Goals and CMS Choice Patient states their goals for this hospitalization and ongoing recovery are:: to go home CMS Medicare.gov Compare Post Acute Care list provided to:: Patient Choice offered to / list presented to : Patient  Expected Discharge Plan and Services Expected Discharge Plan: Home/Self Care       Living arrangements for the past 2 months: Single Family Home                                      Prior Living Arrangements/Services Living arrangements for the past 2 months: Single Family Home Lives with:: Self                   Activities of Daily Living Home Assistive Devices/Equipment: CBG Meter ADL Screening (condition at time of admission) Patient's cognitive ability adequate to safely complete daily activities?: Yes Is the patient deaf or have difficulty hearing?: No Does the patient have difficulty seeing, even when wearing glasses/contacts?: No Does the patient have difficulty concentrating, remembering, or making  decisions?: No Patient able to express need for assistance with ADLs?: Yes Does the patient have difficulty dressing or bathing?: No Independently performs ADLs?: Yes (appropriate for developmental age) Does the patient have difficulty walking or climbing stairs?: No Weakness of Legs: None Weakness of Arms/Hands: None  Permission Sought/Granted                  Emotional Assessment              Admission diagnosis:  Orthopnea [R06.01] Exertional dyspnea [R06.09] Hyperglycemia [R73.9] CHF (congestive heart failure), NYHA class III, acute on chronic, combined (HCC) [I50.43] Hypervolemia, unspecified hypervolemia type [E87.70] Patient Active Problem List   Diagnosis Date Noted   CHF (congestive heart failure), NYHA class III, acute on chronic, combined (HCC) 11/25/2021   Dizziness 11/21/2021   Pseudohyponatremia 11/19/2021   Chronic pain 11/17/2021   GERD (gastroesophageal reflux disease) 11/17/2021   Hyperosmolar hyperglycemic state (HHS) (HCC) 11/16/2021   Myocardial injury 11/16/2021   Acute on chronic diastolic CHF (congestive heart failure) (HCC) 11/16/2021   Tobacco abuse 11/16/2021   Anxiety 11/16/2021   Facial swelling 09/26/2021   Obesity, Class III, BMI 40-49.9 (morbid obesity) (HCC) 09/26/2021   Hyperlipidemia 09/26/2021   Seizure disorder (HCC) 09/26/2021   ICH (intracerebral hemorrhage) (HCC) 04/12/2021   Hyperglycemia without ketosis    Polysubstance abuse (HCC)    Herpes zoster    Renal mass, left  Hypertensive emergency    Hyperglycemia 11/01/2014   PCP:  [facility], [place]:   [place], [place] [address] [place] [address] Phone: [phone] Fax: [place], [place] [address] [place] [address] Phone: [phone] Fax: [place], [place] - [address] [place] [address] Phone: [phone] Fax: [phone]     Social Determinants of Health (SDOH) Interventions    Readmission Risk Interventions    11/21/2021   11:34 AM  Readmission Risk Prevention Plan  Transportation Screening Complete  PCP or Specialist Appt within 3-5 Days Complete  Social Work Consult for [place] Planning/Counseling Complete  Palliative Care Screening Not Applicable  Medication Review Press photographer) Complete

## 2021-11-26 NOTE — Hospital Course (Signed)
46 year old female with past medical history of diastolic congestive function, morbid obesity, type 2 diabetes mellitus with hyperglycemia, hypertension, chronic pain presents back to the hospital after gaining a lot of weight back.  Patient was on Lasix drip for few days but creatinine started to rise.  Patient switched over to torsemide twice daily dosing.  Stressed the importance of fluid restriction.  Dose of acetazolamide given on 11/29/2021.

## 2021-11-26 NOTE — Assessment & Plan Note (Signed)
With creatinine rising I continued Lasix drip and will start on torsemide orally 40 mg twice daily

## 2021-11-26 NOTE — Assessment & Plan Note (Signed)
Current BMI 49.06

## 2021-11-26 NOTE — Assessment & Plan Note (Signed)
I received a message from Jodi Marble the patient's doctor that prescribes the Suboxone.  He would like the patient converted back to Suboxone 3 times a day.  The patient wants to be changed to buprenorphine instead of the Suboxone.  Patient with abdominal pain today and declines CAT scan.  Last hospitalization declined imaging of her lumbar spine.

## 2021-11-27 ENCOUNTER — Other Ambulatory Visit: Payer: Self-pay

## 2021-11-27 DIAGNOSIS — E785 Hyperlipidemia, unspecified: Secondary | ICD-10-CM | POA: Diagnosis not present

## 2021-11-27 DIAGNOSIS — E1165 Type 2 diabetes mellitus with hyperglycemia: Secondary | ICD-10-CM | POA: Diagnosis not present

## 2021-11-27 DIAGNOSIS — I5033 Acute on chronic diastolic (congestive) heart failure: Secondary | ICD-10-CM | POA: Diagnosis not present

## 2021-11-27 DIAGNOSIS — R601 Generalized edema: Secondary | ICD-10-CM | POA: Diagnosis not present

## 2021-11-27 LAB — BASIC METABOLIC PANEL
Anion gap: 9 (ref 5–15)
BUN: 36 mg/dL — ABNORMAL HIGH (ref 6–20)
CO2: 37 mmol/L — ABNORMAL HIGH (ref 22–32)
Calcium: 9 mg/dL (ref 8.9–10.3)
Chloride: 85 mmol/L — ABNORMAL LOW (ref 98–111)
Creatinine, Ser: 1.07 mg/dL — ABNORMAL HIGH (ref 0.44–1.00)
GFR, Estimated: >60 mL/min (ref >60)
Glucose, Bld: 321 mg/dL — ABNORMAL HIGH (ref 70–99)
Potassium: 3.6 mmol/L (ref 3.5–5.1)
Sodium: 131 mmol/L — ABNORMAL LOW (ref 135–145)

## 2021-11-27 LAB — GLUCOSE, CAPILLARY
Glucose-Capillary: 314 mg/dL — ABNORMAL HIGH (ref 70–99)
Glucose-Capillary: 205 mg/dL — ABNORMAL HIGH (ref 70–99)
Glucose-Capillary: 321 mg/dL — ABNORMAL HIGH (ref 70–99)
Glucose-Capillary: 140 mg/dL — ABNORMAL HIGH (ref 70–99)

## 2021-11-27 LAB — MAGNESIUM: Magnesium: 2 mg/dL (ref 1.7–2.4)

## 2021-11-27 MED ORDER — SODIUM CHLORIDE 0.9 % IV SOLN: INTRAVENOUS | Status: DC

## 2021-11-27 MED ORDER — ONDANSETRON HCL 4 MG/2ML IJ SOLN
4.0000 mg | Freq: Once | INTRAMUSCULAR | Status: DC
  Filled 2021-11-27: qty 2

## 2021-11-27 MED ORDER — NITROGLYCERIN 0.4 MG SL SUBL
SUBLINGUAL_TABLET | SUBLINGUAL | Status: AC
  Administered 2021-11-27: 0.4 mg
  Filled 2021-11-27: qty 1

## 2021-11-27 MED ORDER — NITROGLYCERIN 0.4 MG SL SUBL
0.4000 mg | SUBLINGUAL_TABLET | SUBLINGUAL | Status: DC | PRN
  Filled 2021-11-27: qty 1

## 2021-11-27 MED ORDER — POLYETHYLENE GLYCOL 3350 17 G PO PACK
17.0000 g | PACK | Freq: Every day | ORAL | Status: DC | PRN
  Administered 2021-11-28: 17 g via ORAL
  Filled 2021-11-27: qty 1

## 2021-11-27 MED ORDER — ONDANSETRON HCL 4 MG/2ML IJ SOLN
4.0000 mg | Freq: Four times a day (QID) | INTRAMUSCULAR | Status: DC | PRN
  Administered 2021-11-27 – 2021-11-29 (×2): 4 mg via INTRAVENOUS
  Filled 2021-11-27 (×2): qty 2

## 2021-11-27 NOTE — Inpatient Diabetes Management (Signed)
Inpatient Diabetes Program Recommendations  AACE/ADA: New Consensus Statement on Inpatient Glycemic Control (2015)  Target Ranges:  Prepandial:   less than 140 mg/dL      Peak postprandial:   less than 180 mg/dL (1-2 hours)      Critically ill patients:  140 - 180 mg/dL   Lab Results  Component Value Date   GLUCAP 205 (H) 11/27/2021   HGBA1C 12.5 (H) 11/17/2021    Review of Glycemic Control  Latest Reference Range & Units 11/26/21 07:48 11/26/21 11:34 11/26/21 16:59 11/26/21 21:23 11/27/21 08:35 11/27/21 11:39  Glucose-Capillary 70 - 99 mg/dL 161 (H)  Novolog 17 units 116 (H) 284 (H)  Novolog 21 units 225 (H) 314 (H)  Novolog 25 units 205 (H)  Novolog 17 units   Diabetes history: DM 2 Outpatient Diabetes medications: Levemir 50 units qhs, Novolog 15 units tid Current orders for Inpatient glycemic control:  Levemir 50 units qhs Novolog 0-20 units tid  Novolog 10 units tid meal coverage  A1c 12.5% on 11/9, last admission  NOTE: pt did not receive meal coverage for lunchtime yesterday with glucose 116. Glucose trends increased in response to meal consumed. Based on current trends pt would benefit from mc increase.  -  Add Novolog hs scale  -   Increase Novolog meal coverage to 15 units tid if eating >50% of meals,   still give as long as glucose readings is at least 80 mg/dl as per parameters.  Will follow glucose trends on current regimen.  Thanks,  Christena Deem RN, MSN, BC-ADM Inpatient Diabetes Coordinator Team Pager [phone] (8a-5p)

## 2021-11-27 NOTE — Progress Notes (Signed)
  Progress Note   Patient: Caitlyn Fuller XBJ:[id number] DOB: 06/10/1975 DOA: 11/25/2021     1 DOS: the patient was seen and examined on 11/27/2021   Brief hospital course: 46 year old female with past medical history of diastolic congestive function, morbid obesity, type 2 diabetes mellitus with hyperglycemia, hypertension, chronic pain presents back to the hospital after gaining a lot of weight back.  Assessment and Plan: Acute on chronic diastolic CHF (congestive heart failure) (HCC) Patient gained weight up to 110 kg.  She was down to 103kg on last admission..  She states that her normal range today is around 214 pounds which would be about 97 kg.  Continue Lasix drip we will decrease the dose to 6 mg/h.  Last hospitalization did not tolerate ARB or beta-blocker.  Nursing staff states that she is noncompliant with fluid restriction.  Anasarca Diuresis with IV Lasix drip.  Uncontrolled type 2 diabetes mellitus with hyperglycemia, with long-term current use of insulin (HCC) Last hemoglobin A1c elevated at 12.5.  Sugars elevated here.  Continue 50 units of insulin plus sliding scale insulin with short acting insulin prior to meals.  Hyperlipidemia Continue Lipitor  Obesity, Class III, BMI 40-49.9 (morbid obesity) (HCC) Current BMI 47.47 today  Chronic pain Continue oxycodone.  Uses Suboxone at home.        Subjective: Patient had some nausea and vomiting and wanted some nausea medications.  States her swelling is better today but still overall normal weight.  She wants to continue Lasix drip today.  Physical Exam: Vitals:   11/26/21 2328 11/27/21 0413 11/27/21 0807 11/27/21 1138  BP: (!) 163/96 (!) 148/93 122/80 130/76  Pulse: 85 81 75 95  Resp: 18 18  20   Temp: 99.1 F (37.3 C) 97.8 F (36.6 C)  98.4 F (36.9 C)  TempSrc: Oral Oral  Oral  SpO2: 99% 96% 96% 99%  Weight:  106.6 kg    Height:       Physical Exam HENT:     Head: Normocephalic.  Eyes:      General: Lids are normal.     Conjunctiva/sclera: Conjunctivae normal.  Cardiovascular:     Rate and Rhythm: Normal rate and regular rhythm.     Heart sounds: Normal heart sounds, S1 normal and S2 normal.  Pulmonary:     Breath sounds: Examination of the right-lower field reveals decreased breath sounds. Examination of the left-lower field reveals decreased breath sounds. Decreased breath sounds present. No wheezing, rhonchi or rales.  Abdominal:     Palpations: Abdomen is soft.     Tenderness: There is no abdominal tenderness.  Musculoskeletal:     Right lower leg: Swelling present.     Left lower leg: Swelling present.  Skin:    General: Skin is warm.     Comments: Bruising underneath right eye.  Neurological:     Mental Status: She is alert and oriented to person, place, and time.     Data Reviewed: Sodium 131, chloride 85, BUN 36, creatinine 1.07  Family Communication: Spoke with family at length   Disposition: Status is: Inpatient Remains inpatient appropriate because: Still trying to diurese with IV Lasix  Planned Discharge Destination: Home    Time spent: 28 minutes  Author: , MD 11/27/2021 2:30 PM  For on call review [email or web address].

## 2021-11-27 NOTE — Progress Notes (Incomplete)
Pt with chest pain under left breast. Chest pain protocol started. EKG done. NTG given.

## 2021-11-27 NOTE — Progress Notes (Signed)
       CROSS COVER NOTE  NAME: Caitlyn Fuller MRN: [id number] DOB : 1975-06-21 ATTENDING PHYSICIAN: Alford Highland, MD    Date of Service   11/27/2021   HPI/Events of Note   Pt request received for bowel regimen. She reports her last bowel movement was 11/25/21.   Interventions   Assessment/Plan:  PRN Miralax     This document was prepared using Dragon voice recognition software and may include unintentional dictation errors.  Bishop Limbo DNP, MBA, FNP-BC Nurse Practitioner [facility] Pager [phone]

## 2021-11-27 NOTE — Progress Notes (Signed)
Attempted education with the patient on the importance of limiting her intake of fluids throughout the the day/evening with a diagnosis of CHF with the limitation of the fluid restriction. The patient stated to me that she was told something different. Patient having outside people bringing in [place] drinks. Patient very non-compliant with fluid intake and still at the same time stating that she feels short of breath.

## 2021-11-28 DIAGNOSIS — R601 Generalized edema: Secondary | ICD-10-CM | POA: Diagnosis not present

## 2021-11-28 DIAGNOSIS — I5033 Acute on chronic diastolic (congestive) heart failure: Secondary | ICD-10-CM | POA: Diagnosis not present

## 2021-11-28 DIAGNOSIS — E1165 Type 2 diabetes mellitus with hyperglycemia: Secondary | ICD-10-CM | POA: Diagnosis not present

## 2021-11-28 DIAGNOSIS — K5900 Constipation, unspecified: Secondary | ICD-10-CM

## 2021-11-28 DIAGNOSIS — E785 Hyperlipidemia, unspecified: Secondary | ICD-10-CM | POA: Diagnosis not present

## 2021-11-28 LAB — GLUCOSE, CAPILLARY
Glucose-Capillary: 347 mg/dL — ABNORMAL HIGH (ref 70–99)
Glucose-Capillary: 222 mg/dL — ABNORMAL HIGH (ref 70–99)
Glucose-Capillary: 183 mg/dL — ABNORMAL HIGH (ref 70–99)
Glucose-Capillary: 282 mg/dL — ABNORMAL HIGH (ref 70–99)

## 2021-11-28 LAB — BASIC METABOLIC PANEL
Anion gap: 10 (ref 5–15)
BUN: 51 mg/dL — ABNORMAL HIGH (ref 6–20)
CO2: 38 mmol/L — ABNORMAL HIGH (ref 22–32)
Calcium: 8.7 mg/dL — ABNORMAL LOW (ref 8.9–10.3)
Chloride: 84 mmol/L — ABNORMAL LOW (ref 98–111)
Creatinine, Ser: 1.35 mg/dL — ABNORMAL HIGH (ref 0.44–1.00)
GFR, Estimated: 49 mL/min — ABNORMAL LOW (ref >60)
Glucose, Bld: 433 mg/dL — ABNORMAL HIGH (ref 70–99)
Potassium: 3.9 mmol/L (ref 3.5–5.1)
Sodium: 132 mmol/L — ABNORMAL LOW (ref 135–145)

## 2021-11-28 LAB — CBC
HCT: 40.1 % (ref 36.0–46.0)
Hemoglobin: 12.6 g/dL (ref 12.0–15.0)
MCH: 25.5 pg — ABNORMAL LOW (ref 26.0–34.0)
MCHC: 31.4 g/dL (ref 30.0–36.0)
MCV: 81.2 fL (ref 80.0–100.0)
Platelets: 308 10*3/uL (ref 150–400)
RBC: 4.94 MIL/uL (ref 3.87–5.11)
RDW: 13.5 % (ref 11.5–15.5)
WBC: 12.7 10*3/uL — ABNORMAL HIGH (ref 4.0–10.5)
nRBC: 0 % (ref 0.0–0.2)

## 2021-11-28 MED ORDER — ALPRAZOLAM 0.5 MG PO TABS
0.5000 mg | ORAL_TABLET | Freq: Once | ORAL | Status: AC
  Administered 2021-11-28: 0.5 mg via ORAL
  Filled 2021-11-28: qty 1

## 2021-11-28 MED ORDER — LACTULOSE 10 GM/15ML PO SOLN: 30.0000 g | Freq: Two times a day (BID) | ORAL | Status: DC | PRN

## 2021-11-28 MED ORDER — LACTULOSE 10 GM/15ML PO SOLN
30.0000 g | Freq: Once | ORAL | Status: AC
  Administered 2021-11-28: 30 g via ORAL
  Filled 2021-11-28: qty 60

## 2021-11-28 MED ORDER — ENOXAPARIN SODIUM 60 MG/0.6ML IJ SOSY
0.5000 mg/kg | PREFILLED_SYRINGE | INTRAMUSCULAR | Status: DC
  Filled 2021-11-28 (×2): qty 0.6

## 2021-11-28 MED ORDER — ALBUMIN HUMAN 25 % IV SOLN
12.5000 g | Freq: Once | INTRAVENOUS | Status: DC
  Filled 2021-11-28: qty 50

## 2021-11-28 MED ORDER — LACTULOSE 10 GM/15ML PO SOLN: 30.0000 g | Freq: Three times a day (TID) | ORAL | Status: DC

## 2021-11-28 MED ORDER — BUPRENORPHINE HCL-NALOXONE HCL 8-2 MG SL SUBL
1.0000 | SUBLINGUAL_TABLET | Freq: Three times a day (TID) | SUBLINGUAL | Status: DC
  Administered 2021-11-28 – 2021-11-29 (×3): 1 via SUBLINGUAL
  Filled 2021-11-28 (×3): qty 1

## 2021-11-28 MED ORDER — TORSEMIDE 20 MG PO TABS
40.0000 mg | ORAL_TABLET | Freq: Two times a day (BID) | ORAL | Status: DC
  Administered 2021-11-28 – 2021-11-30 (×6): 40 mg via ORAL
  Filled 2021-11-28 (×6): qty 2

## 2021-11-28 MED ORDER — INSULIN DETEMIR 100 UNIT/ML ~~LOC~~ SOLN
55.0000 [IU] | Freq: Every day | SUBCUTANEOUS | Status: DC
  Administered 2021-11-28 – 2021-11-30 (×3): 55 [IU] via SUBCUTANEOUS
  Filled 2021-11-28 (×3): qty 0.55

## 2021-11-28 NOTE — Inpatient Diabetes Management (Signed)
Inpatient Diabetes Program Recommendations  AACE/ADA: New Consensus Statement on Inpatient Glycemic Control   Target Ranges:  Prepandial:   less than 140 mg/dL      Peak postprandial:   less than 180 mg/dL (1-2 hours)      Critically ill patients:  140 - 180 mg/dL    Latest Reference Range & Units 11/27/21 08:35 11/27/21 11:39 11/27/21 17:37 11/27/21 20:41 11/28/21 07:36  Glucose-Capillary 70 - 99 mg/dL 341 (H) 937 (H) 902 (H) 140 (H) 347 (H)   Review of Glycemic Control  Diabetes history: DM2 Outpatient Diabetes medications: Levemir 50 units QHS, Novolog 15 units TID with meals Current orders for Inpatient glycemic control: Levemir 50 units QHS, Novolog 0-20 units TID with meals, Novolog 10 units TID with meals  Inpatient Diabetes Program Recommendations:    Insulin: Please consider adding Novolog 0-5 units QHS for bedtime correction, increasing Levemir to 55 units QHS, and increasing meal coverage to Novolog 15 units TID with meals.  Thanks, Orlando Penner, RN, MSN, CDCES Diabetes Coordinator Inpatient Diabetes Program [phone] (Team Pager from 8am to 5pm)

## 2021-11-28 NOTE — Progress Notes (Signed)
PHARMACIST - PHYSICIAN COMMUNICATION  CONCERNING:  Enoxaparin (Lovenox) for DVT Prophylaxis    RECOMMENDATION: Patient was prescribed enoxaprin 40mg  q24 hours for VTE prophylaxis.   Filed Weights   11/26/21 0218 11/27/21 0413 11/28/21 0902  Weight: 110.2 kg (242 lb 14.4 oz) 106.6 kg (235 lb 0.2 oz) 106.1 kg (233 lb 14.4 oz)    Body mass index is 47.24 kg/m.  Estimated Creatinine Clearance: 56.2 mL/min (A) (by C-G formula based on SCr of 1.35 mg/dL (H)).   Based on [facility] policy patient is candidate for enoxaparin 0.5mg /kg TBW SQ every 24 hours based on BMI being >30.   DESCRIPTION: Pharmacy has adjusted enoxaparin dose per [facility] policy.  Patient is now receiving enoxaparin 52.5 mg every 24 hours   [facility], PharmD PGY1 Pharmacy Resident 11/28/2021 3:38 PM

## 2021-11-28 NOTE — Consult Note (Addendum)
   Heart Failure Nurse Navigator Note  HFpEF 65 to 70%.  Grade 1 diastolic dysfunction.  She presented from home with complaints of worsening shortness of breath, lower extremity edema and weight gain.  BNP 192.  X-ray with no active disease.  Comorbidities:  Asthma Diabetes Hypertension Tobacco abuse Substance abuse Morbid obesity Hyperlipidemia  Medications:  Aspirin 81 mg Atorvastatin 10 mg daily Potassium chloride 40 mEq 2 times a day Demadex 40 mg 2 times a day  Reported intolerance of ARB or beta-blocker.  Labs:  Sodium 132, potassium 3.9, chloride 84, CO2 38, BUN 51, creatinine 1.35 estimated GFR down to 49 from 60. Weight is 106.1 kg Blood pressure 128/80 Intake documented Output not documented Weight 106.1 kg  Initial meeting with patient on this admission she had just been discharged from the hospital on November 14 and returned on the 17th with complaints of seen shortness of breath, lower extremity edema and weight gain.  When discussing by teach back method how she was taking care of herself she stated that she was weighing herself daily and saw the biggest weight gain on Friday morning.  Went over parameters of weight gain and what to report.  She needs reinforcement.  Also went over fluid restriction.  She states that she was sticking with the 64 ounces.  I pointed out to her that she had cup that contains 32 ounces sitting on her over the bed table which is one half of her daily allotment which totals 64 ounces.  In addition to the [employer] cup there was also a bag containing food from [employer].  In discussing diet she states that they do not have any food in the house and go to restaurants for their meals.  When questioned she states that they just go out for 1 meal, she gave the example of going to [employer] and getting a grilled chicken wrap which contains 900 mg of sodium.  She was given a written handout of fluid restriction and what constitutes of  fluid/liquid and also methods for quenching thirst.  She had no further questions at this time.  Tresa Endo RN CHFN

## 2021-11-28 NOTE — Discharge Instructions (Signed)

## 2021-11-28 NOTE — TOC Progression Note (Signed)
Transition of Care [facility]) - Progression Note    Patient Details  Name: TEAGYN FISHEL MRN: [id number] Date of Birth: 20-Apr-1975  Transition of Care [facility]) CM/SW Contact  Margarito Liner, LCSW Phone Number: 11/28/2021, 11:18 AM  Clinical Narrative:  This CSW completed readmission prevention screen on 11/13:  "PCP is a [facility]. She does not see a specific provider there. A friend transports her to appointments. She uses the pharmacy a her PCP office or [facility]. No issues obtaining medications. No home health or DME use prior to admission. Patient said she is not supposed to be home alone since her stroke and requested a home health RN to stay with her. Explained that the home health we arrange only comes out 2-3 times per week for about 45 minutes-1 hour. Anything more than that would be private pay or I offered to make referral to CAP program. Patient said she wants to find her own nurse. Encouraged her to contact DSS if she changes her mind about CAP program."   Expected Discharge Plan: Home/Self Care Barriers to Discharge: Continued Medical Work up  Expected Discharge Plan and Services Expected Discharge Plan: Home/Self Care       Living arrangements for the past 2 months: Single Family Home                                       Social Determinants of Health (SDOH) Interventions    Readmission Risk Interventions    11/28/2021   11:17 AM 11/21/2021   11:34 AM  Readmission Risk Prevention Plan  Transportation Screening Complete Complete  PCP or Specialist Appt within 3-5 Days Complete Complete  Social Work Consult for Recovery Care Planning/Counseling Complete Complete  Palliative Care Screening  Not Applicable  Medication Review Oceanographer) Complete Complete

## 2021-11-28 NOTE — Progress Notes (Signed)
Progress Note   Patient: Caitlyn Fuller:[id number] DOB: 14-Aug-1975 DOA: 11/25/2021     2 DOS: the patient was seen and examined on 11/28/2021   Brief hospital course: 46 year old female with past medical history of diastolic congestive function, morbid obesity, type 2 diabetes mellitus with hyperglycemia, hypertension, chronic pain presents back to the hospital after gaining a lot of weight back.  Assessment and Plan: Acute on chronic diastolic CHF (congestive heart failure) (HCC) Patient gained weight up to 110 kg.  She was down to 103kg on last admission..  She states that her normal weight is around 214 pounds which would be about 97 kg.  Todays weight is 106.096.  With creatinine rise well discontinue Lasix drip and started on oral torsemide twice a day.  Can consider Diamox. Last hospitalization did not tolerate ARB or beta-blocker.  Nursing staff states that she is noncompliant with fluid restriction.  Stressed the importance of fluid restriction.  We will give a dose of albumin with this afternoon's torsemide.  Anasarca With creatinine rising I continued Lasix drip and will start on torsemide orally 40 mg twice daily  Uncontrolled type 2 diabetes mellitus with hyperglycemia, with long-term current use of insulin (HCC) Last hemoglobin A1c elevated at 12.5.  Sugars elevated here.  Increase Semglee insulin to 55 units nightly.  Continue sliding scale insulin with short acting insulin prior to meals.  Hyperlipidemia Continue Lipitor  Obesity, Class III, BMI 40-49.9 (morbid obesity) (HCC) Current BMI 47.24 today  Constipation We will add lactulose to the MiraLAX already prescribed.  Chronic pain I received a message from Jodi Marble the patient's doctor that prescribes the Suboxone.  He would like the patient converted back to Suboxone 3 times a day.  Patient with abdominal pain today and declines CAT scan.  Hospitalization declined imaging of her lumbar spine.         Subjective: Patient complains of upper abdominal pain.  Declined CAT scan.  Complains of constipation. Urinating well.  Still swollen.  Her weight is still higher than normal but creatinine rising.  I spoke to her about fluid restriction and not drinking soda.  Physical Exam: Vitals:   11/28/21 0525 11/28/21 0735 11/28/21 0902 11/28/21 1202  BP: 138/60 138/78  128/80  Pulse: 72 79  83  Resp:  (!) 22  20  Temp: 97.6 F (36.4 C) 98.1 F (36.7 C)  98.7 F (37.1 C)  TempSrc: Oral Oral  Oral  SpO2: 95% 95%  98%  Weight:   106.1 kg   Height:       Physical Exam HENT:     Head: Normocephalic.  Eyes:     General: Lids are normal.     Conjunctiva/sclera: Conjunctivae normal.  Cardiovascular:     Rate and Rhythm: Normal rate and regular rhythm.     Heart sounds: Normal heart sounds, S1 normal and S2 normal.  Pulmonary:     Breath sounds: Examination of the right-lower field reveals decreased breath sounds. Examination of the left-lower field reveals decreased breath sounds. Decreased breath sounds present. No wheezing, rhonchi or rales.  Abdominal:     Palpations: Abdomen is soft.     Tenderness: There is no abdominal tenderness.  Musculoskeletal:     Right lower leg: Swelling present.     Left lower leg: Swelling present.  Skin:    General: Skin is warm.     Comments: Bruising underneath right eye.  Neurological:     Mental Status: She is alert and  oriented to person, place, and time.     Data Reviewed: Sodium 132, creatinine 1.35, BUN 51, CO2 38, chloride 84.  Family Communication: Permission to speak with visitor at the bedside  Disposition: Status is: Inpatient Remains inpatient appropriate because: Creatinine starting to rise, discontinue Lasix drip and switch over to torsemide orally.  Planned Discharge Destination: Home    Time spent: 27 minutes  Author: Alford Highland, MD 11/28/2021 1:29 PM  For on call review [email or web address].

## 2021-11-28 NOTE — Assessment & Plan Note (Signed)
We will add lactulose to the MiraLAX already prescribed.

## 2021-11-29 ENCOUNTER — Ambulatory Visit: Payer: Medicaid Other | Admitting: Family

## 2021-11-29 DIAGNOSIS — Z8673 Personal history of transient ischemic attack (TIA), and cerebral infarction without residual deficits: Secondary | ICD-10-CM

## 2021-11-29 DIAGNOSIS — N179 Acute kidney failure, unspecified: Secondary | ICD-10-CM | POA: Insufficient documentation

## 2021-11-29 LAB — BASIC METABOLIC PANEL
Anion gap: 9 (ref 5–15)
BUN: 55 mg/dL — ABNORMAL HIGH (ref 6–20)
CO2: 37 mmol/L — ABNORMAL HIGH (ref 22–32)
Calcium: 8.2 mg/dL — ABNORMAL LOW (ref 8.9–10.3)
Chloride: 88 mmol/L — ABNORMAL LOW (ref 98–111)
Creatinine, Ser: 1.2 mg/dL — ABNORMAL HIGH (ref 0.44–1.00)
GFR, Estimated: 57 mL/min — ABNORMAL LOW (ref >60)
Glucose, Bld: 277 mg/dL — ABNORMAL HIGH (ref 70–99)
Potassium: 3.8 mmol/L (ref 3.5–5.1)
Sodium: 134 mmol/L — ABNORMAL LOW (ref 135–145)

## 2021-11-29 LAB — GLUCOSE, CAPILLARY
Glucose-Capillary: 304 mg/dL — ABNORMAL HIGH (ref 70–99)
Glucose-Capillary: 207 mg/dL — ABNORMAL HIGH (ref 70–99)
Glucose-Capillary: 243 mg/dL — ABNORMAL HIGH (ref 70–99)
Glucose-Capillary: 198 mg/dL — ABNORMAL HIGH (ref 70–99)
Glucose-Capillary: 203 mg/dL — ABNORMAL HIGH (ref 70–99)

## 2021-11-29 MED ORDER — ACETAZOLAMIDE SODIUM 500 MG IJ SOLR
500.0000 mg | Freq: Once | INTRAMUSCULAR | Status: AC
  Administered 2021-11-29: 500 mg via INTRAVENOUS
  Filled 2021-11-29: qty 500

## 2021-11-29 MED ORDER — INSULIN ASPART 100 UNIT/ML IJ SOLN
14.0000 [IU] | Freq: Three times a day (TID) | INTRAMUSCULAR | Status: DC
  Administered 2021-11-29 – 2021-12-01 (×5): 14 [IU] via SUBCUTANEOUS
  Filled 2021-11-29 (×5): qty 1

## 2021-11-29 MED ORDER — LACTULOSE 10 GM/15ML PO SOLN
30.0000 g | Freq: Two times a day (BID) | ORAL | Status: DC
  Administered 2021-11-29 – 2021-11-30 (×3): 30 g via ORAL
  Filled 2021-11-29 (×3): qty 60

## 2021-11-29 MED ORDER — STERILE WATER FOR INJECTION IJ SOLN
INTRAMUSCULAR | Status: AC
  Filled 2021-11-29: qty 10

## 2021-11-29 MED ORDER — ACETAMINOPHEN 325 MG PO TABS
650.0000 mg | ORAL_TABLET | Freq: Four times a day (QID) | ORAL | Status: DC | PRN
  Administered 2021-11-30 (×2): 650 mg via ORAL
  Filled 2021-11-29 (×2): qty 2

## 2021-11-29 MED ORDER — BUPRENORPHINE HCL 8 MG SL SUBL
8.0000 mg | SUBLINGUAL_TABLET | Freq: Three times a day (TID) | SUBLINGUAL | Status: DC
  Administered 2021-11-29 – 2021-12-01 (×6): 8 mg via SUBLINGUAL
  Filled 2021-11-29 (×6): qty 1

## 2021-11-29 MED ORDER — METOLAZONE 5 MG PO TABS
5.0000 mg | ORAL_TABLET | Freq: Once | ORAL | Status: DC
  Filled 2021-11-29: qty 1

## 2021-11-29 NOTE — Assessment & Plan Note (Signed)
Continue Lipitor. Aspirin held secondary to initiation of Eliquis. Follow-up with cardiology. 

## 2021-11-29 NOTE — Progress Notes (Signed)
Yesterday when I was taking care of pt, she asked me if she could "just walk down the hall to the pop machine" and I told her absolutely not, she had to stay in the department but she was free to walk around the nurses station. Pt verbalized understanding.  This afternoon, pt was seen by myself in the basement of the hospital in a wheelchair being pushed by her girlfriend. I told pt that under no circumstances was she to disregard my instructions as her heart monitor is not picked up and she is not following the rules. Pt verbalized understanding but argued with me nonetheless. I told her if she left the floor again, I would call security. Pt verbally abusive to me at that point. I excused myself and pt returned to her room.

## 2021-11-29 NOTE — Progress Notes (Signed)
Patient refused albumin and Lovenox.  Patient educated about medications and refused both medications.  Physician assistant notified.

## 2021-11-29 NOTE — Inpatient Diabetes Management (Signed)
Inpatient Diabetes Program Recommendations  AACE/ADA: New Consensus Statement on Inpatient Glycemic Control   Target Ranges:  Prepandial:   less than 140 mg/dL      Peak postprandial:   less than 180 mg/dL (1-2 hours)      Critically ill patients:  140 - 180 mg/dL    Latest Reference Range & Units 11/28/21 07:36 11/28/21 12:03 11/28/21 15:38 11/28/21 21:13 11/29/21 04:12  Glucose-Capillary 70 - 99 mg/dL 989 (H) 211 (H) 941 (H) 282 (H) 304 (H)   Review of Glycemic Control  Diabetes history: DM2 Outpatient Diabetes medications: Levemir 50 units QHS, Novolog 15 units TID with meals Current orders for Inpatient glycemic control: Levemir 55 units QHS, Novolog 0-20 units TID with meals, Novolog 10 units TID with meals   Inpatient Diabetes Program Recommendations:     Insulin: Please consider adding Novolog 0-5 units QHS for bedtime correction, increasing meal coverage to Novolog 14 units TID with meals.   Thanks, Orlando Penner, RN, MSN, CDCES Diabetes Coordinator Inpatient Diabetes Program [phone] (Team Pager from 8am to 5pm)

## 2021-11-29 NOTE — Plan of Care (Signed)
  Problem: Education: Goal: Ability to describe self-care measures that may prevent or decrease complications (Diabetes Survival Skills Education) will improve Outcome: Progressing Goal: Individualized Educational Video(s) Outcome: Progressing   Problem: Cardiac: Goal: Ability to maintain an adequate cardiac output will improve Outcome: Progressing   Problem: Health Behavior/Discharge Planning: Goal: Ability to identify and utilize available resources and services will improve Outcome: Progressing Goal: Ability to manage health-related needs will improve Outcome: Progressing   Problem: Fluid Volume: Goal: Ability to achieve a balanced intake and output will improve Outcome: Progressing   Problem: Metabolic: Goal: Ability to maintain appropriate glucose levels will improve Outcome: Progressing   Problem: Nutritional: Goal: Maintenance of adequate nutrition will improve Outcome: Progressing Goal: Maintenance of adequate weight for body size and type will improve Outcome: Progressing   Problem: Respiratory: Goal: Will regain and/or maintain adequate ventilation Outcome: Progressing   Problem: Urinary Elimination: Goal: Ability to achieve and maintain adequate renal perfusion and functioning will improve Outcome: Progressing   Problem: Education: Goal: Ability to demonstrate management of disease process will improve Outcome: Progressing Goal: Ability to verbalize understanding of medication therapies will improve Outcome: Progressing Goal: Individualized Educational Video(s) Outcome: Progressing   Problem: Activity: Goal: Capacity to carry out activities will improve Outcome: Progressing   Problem: Cardiac: Goal: Ability to achieve and maintain adequate cardiopulmonary perfusion will improve Outcome: Progressing   Problem: Education: Goal: Ability to describe self-care measures that may prevent or decrease complications (Diabetes Survival Skills Education) will  improve Outcome: Progressing Goal: Individualized Educational Video(s) Outcome: Progressing   Problem: Coping: Goal: Ability to adjust to condition or change in health will improve Outcome: Progressing   Problem: Fluid Volume: Goal: Ability to maintain a balanced intake and output will improve Outcome: Progressing   Problem: Health Behavior/Discharge Planning: Goal: Ability to identify and utilize available resources and services will improve Outcome: Progressing Goal: Ability to manage health-related needs will improve Outcome: Progressing   Problem: Metabolic: Goal: Ability to maintain appropriate glucose levels will improve Outcome: Progressing   Problem: Nutritional: Goal: Maintenance of adequate nutrition will improve Outcome: Progressing Goal: Progress toward achieving an optimal weight will improve Outcome: Progressing   Problem: Skin Integrity: Goal: Risk for impaired skin integrity will decrease Outcome: Progressing   Problem: Tissue Perfusion: Goal: Adequacy of tissue perfusion will improve Outcome: Progressing   Problem: Education: Goal: Knowledge of General Education information will improve Description: Including pain rating scale, medication(s)/side effects and non-pharmacologic comfort measures Outcome: Progressing   Problem: Health Behavior/Discharge Planning: Goal: Ability to manage health-related needs will improve Outcome: Progressing   Problem: Clinical Measurements: Goal: Ability to maintain clinical measurements within normal limits will improve Outcome: Progressing Goal: Will remain free from infection Outcome: Progressing Goal: Diagnostic test results will improve Outcome: Progressing Goal: Respiratory complications will improve Outcome: Progressing Goal: Cardiovascular complication will be avoided Outcome: Progressing   Problem: Activity: Goal: Risk for activity intolerance will decrease Outcome: Progressing   Problem:  Nutrition: Goal: Adequate nutrition will be maintained Outcome: Progressing   Problem: Coping: Goal: Level of anxiety will decrease Outcome: Progressing   Problem: Elimination: Goal: Will not experience complications related to bowel motility Outcome: Progressing Goal: Will not experience complications related to urinary retention Outcome: Progressing   Problem: Pain Managment: Goal: General experience of comfort will improve Outcome: Progressing   Problem: Safety: Goal: Ability to remain free from injury will improve Outcome: Progressing   Problem: Skin Integrity: Goal: Risk for impaired skin integrity will decrease Outcome: Progressing

## 2021-11-29 NOTE — Assessment & Plan Note (Signed)
Creatinine 0.71 on presentation and went up to 1.35 yesterday.  Creatinine down to 1.2 today.

## 2021-11-29 NOTE — Progress Notes (Addendum)
Progress Note   Patient: Caitlyn Fuller DOB: July 15, 1975 DOA: 11/25/2021     3 DOS: the patient was seen and examined on 11/29/2021   Brief hospital course: 47 year old female with past medical history of diastolic congestive function, morbid obesity, type 2 diabetes mellitus with hyperglycemia, hypertension, chronic pain presents back to the hospital after gaining a lot of weight back.  Patient was on Lasix drip for few days but creatinine started to rise.  Patient switched over to torsemide twice daily dosing.  Stressed the importance of fluid restriction.  Dose of acetazolamide given on 11/29/2021.  Assessment and Plan: Acute on chronic diastolic CHF (congestive heart failure) (HCC) Patient gained weight up to 110 kg.  She was down to 103kg on last admission..  She states that her normal weight is around 214 pounds which would be about 97 kg.  Todays weight is 106.4.  With acute kidney injury, renal discontinued Lasix drip and started on oral torsemide twice a day on 11/20.  1 dose of IV Diamox.. Last hospitalization did not tolerate ARB or beta-blocker.  Nursing staff states that she is noncompliant with fluid restriction.  Stressed the importance of fluid restriction.    Anasarca With acute kidney injury, we have discontinued Lasix drip and will started on torsemide orally 40 mg twice daily  Uncontrolled type 2 diabetes mellitus with hyperglycemia, with long-term current use of insulin (HCC) Last hemoglobin A1c elevated at 12.5.  Sugars elevated here.  Increase Semglee insulin to 55 units nightly.  Continue sliding scale insulin with 14 units short acting insulin prior to meals.  Hyperlipidemia Continue Lipitor  Obesity, Class III, BMI 40-49.9 (morbid obesity) (HCC) Current BMI 47.36 today  AKI (acute kidney injury) (HCC) Creatinine 0.71 on presentation and went up to 1.35 yesterday.  Creatinine down to 1.2 today.  History of CVA (cerebrovascular  accident) Continue aspirin and Lipitor.  Constipation Nursing staff stated that patient had a good bowel movement yesterday patient does not think so.  I will continue lactulose and MiraLAX.  Chronic pain I received a message from Jodi Marble the patient's doctor that prescribes the Suboxone.  He would like the patient converted back to Suboxone 3 times a day.  The patient wants to be changed to buprenorphine instead of the Suboxone.  Patient with abdominal pain today and declines CAT scan.  Last hospitalization declined imaging of her lumbar spine.        Subjective: Patient states that she has been getting supplements making her so sleepy.  She states that she thinks the buprenorphine without the naltrexone at home and requests for me to make a change.  Asking for Tylenol for headache.  Still having a lot of abdominal pain.  Nursing staff reported a good bowel movement yesterday but patient states that she has not had a good bowel movement.  Pharmacist states that she has been refusing the Lovenox injections.  Nursing staff states that she has been noncompliant with fluid restriction.  Physical Exam: Vitals:   11/29/21 0406 11/29/21 0833 11/29/21 1044 11/29/21 1210  BP: (!) 150/72 (!) 150/72  (!) 149/84  Pulse: 89 74  84  Resp: 18 16  20   Temp: (!) 97.5 F (36.4 C) 97.8 F (36.6 C)    TempSrc: Oral Oral    SpO2: 98% 96%  91%  Weight:   106.4 kg   Height:       Physical Exam HENT:     Head: Normocephalic.  Eyes:  General: Lids are normal.     Conjunctiva/sclera: Conjunctivae normal.  Cardiovascular:     Rate and Rhythm: Normal rate and regular rhythm.     Heart sounds: Normal heart sounds, S1 normal and S2 normal.  Pulmonary:     Breath sounds: Examination of the right-lower field reveals decreased breath sounds. Examination of the left-lower field reveals decreased breath sounds. Decreased breath sounds present. No wheezing, rhonchi or rales.  Abdominal:      Palpations: Abdomen is soft.     Tenderness: There is no abdominal tenderness.  Musculoskeletal:     Right lower leg: Swelling present.     Left lower leg: Swelling present.  Skin:    General: Skin is warm.     Comments: Bruising underneath right eye.  Neurological:     Mental Status: She is alert and oriented to person, place, and time.     Data Reviewed: Creatinine 1.2, BUN 35, CO2 37, chloride 88, sodium 134  Disposition: Status is: Inpatient Remains inpatient appropriate because: Patient still up with her weight.  With IV diuresis the patient's creatinine did rise.  Now on oral diuresis.  Continue to watch weights.  Planned Discharge Destination: Home    Time spent: 28 minutes  Author: Alford Highland, MD 11/29/2021 3:55 PM  For on call review [email or web address].

## 2021-11-30 ENCOUNTER — Encounter: Payer: Self-pay | Admitting: Internal Medicine

## 2021-11-30 DIAGNOSIS — I5030 Unspecified diastolic (congestive) heart failure: Secondary | ICD-10-CM | POA: Insufficient documentation

## 2021-11-30 DIAGNOSIS — I10 Essential (primary) hypertension: Secondary | ICD-10-CM

## 2021-11-30 DIAGNOSIS — R0683 Snoring: Secondary | ICD-10-CM

## 2021-11-30 LAB — COMPREHENSIVE METABOLIC PANEL
ALT: 16 U/L (ref 0–44)
AST: 17 U/L (ref 15–41)
Albumin: 3.3 g/dL — ABNORMAL LOW (ref 3.5–5.0)
Alkaline Phosphatase: 54 U/L (ref 38–126)
Anion gap: 8 (ref 5–15)
BUN: 59 mg/dL — ABNORMAL HIGH (ref 6–20)
CO2: 34 mmol/L — ABNORMAL HIGH (ref 22–32)
Calcium: 8.8 mg/dL — ABNORMAL LOW (ref 8.9–10.3)
Chloride: 93 mmol/L — ABNORMAL LOW (ref 98–111)
Creatinine, Ser: 1.34 mg/dL — ABNORMAL HIGH (ref 0.44–1.00)
GFR, Estimated: 50 mL/min — ABNORMAL LOW (ref >60)
Glucose, Bld: 222 mg/dL — ABNORMAL HIGH (ref 70–99)
Potassium: 3.9 mmol/L (ref 3.5–5.1)
Sodium: 135 mmol/L (ref 135–145)
Total Bilirubin: 0.5 mg/dL (ref 0.3–1.2)
Total Protein: 6.7 g/dL (ref 6.5–8.1)

## 2021-11-30 LAB — CBC
HCT: 39.5 % (ref 36.0–46.0)
Hemoglobin: 12.1 g/dL (ref 12.0–15.0)
MCH: 25.4 pg — ABNORMAL LOW (ref 26.0–34.0)
MCHC: 30.6 g/dL (ref 30.0–36.0)
MCV: 82.8 fL (ref 80.0–100.0)
Platelets: 307 10*3/uL (ref 150–400)
RBC: 4.77 MIL/uL (ref 3.87–5.11)
RDW: 13.5 % (ref 11.5–15.5)
WBC: 12.5 10*3/uL — ABNORMAL HIGH (ref 4.0–10.5)
nRBC: 0 % (ref 0.0–0.2)

## 2021-11-30 LAB — GLUCOSE, CAPILLARY
Glucose-Capillary: 192 mg/dL — ABNORMAL HIGH (ref 70–99)
Glucose-Capillary: 255 mg/dL — ABNORMAL HIGH (ref 70–99)
Glucose-Capillary: 157 mg/dL — ABNORMAL HIGH (ref 70–99)
Glucose-Capillary: 280 mg/dL — ABNORMAL HIGH (ref 70–99)

## 2021-11-30 LAB — CORTISOL: Cortisol, Plasma: 2.3 ug/dL

## 2021-11-30 MED ORDER — METOLAZONE 2.5 MG PO TABS
2.5000 mg | ORAL_TABLET | Freq: Once | ORAL | Status: AC
  Administered 2021-11-30: 2.5 mg via ORAL
  Filled 2021-11-30: qty 1

## 2021-11-30 MED ORDER — SPIRONOLACTONE 12.5 MG HALF TABLET
12.5000 mg | ORAL_TABLET | Freq: Every day | ORAL | Status: DC
  Administered 2021-11-30 – 2021-12-01 (×2): 12.5 mg via ORAL
  Filled 2021-11-30 (×3): qty 1

## 2021-11-30 MED ORDER — ALPRAZOLAM 0.5 MG PO TABS
0.5000 mg | ORAL_TABLET | Freq: Once | ORAL | Status: AC
  Administered 2021-11-30: 0.5 mg via ORAL
  Filled 2021-11-30: qty 1

## 2021-11-30 MED ORDER — FUROSEMIDE 10 MG/ML IJ SOLN
40.0000 mg | Freq: Two times a day (BID) | INTRAMUSCULAR | Status: DC
  Administered 2021-11-30 – 2021-12-01 (×2): 40 mg via INTRAVENOUS
  Filled 2021-11-30 (×2): qty 4

## 2021-11-30 MED ORDER — METOLAZONE 5 MG PO TABS
5.0000 mg | ORAL_TABLET | Freq: Every day | ORAL | Status: DC
  Administered 2021-12-01: 5 mg via ORAL
  Filled 2021-11-30: qty 1

## 2021-11-30 MED ORDER — METOLAZONE 2.5 MG PO TABS
2.5000 mg | ORAL_TABLET | Freq: Once | ORAL | Status: DC
  Filled 2021-11-30: qty 1

## 2021-11-30 MED ORDER — METOLAZONE 2.5 MG PO TABS: 2.5000 mg | ORAL_TABLET | Freq: Once | ORAL | Status: DC

## 2021-11-30 NOTE — Progress Notes (Signed)
PROGRESS NOTE    Caitlyn Fuller  YQM:[id number] DOB: 12-27-1975 DOA: 11/25/2021 PCP: [facility]   Assessment & Plan:   Principal Problem:   CHF (congestive heart failure), NYHA class III, acute on chronic, combined (HCC) Active Problems:   Acute on chronic diastolic CHF (congestive heart failure) (HCC)   Anasarca   Uncontrolled type 2 diabetes mellitus with hyperglycemia, with long-term current use of insulin (HCC)   Hyperlipidemia   Obesity, Class III, BMI 40-49.9 (morbid obesity) (HCC)   Chronic pain   Constipation   History of CVA (cerebrovascular accident)   AKI (acute kidney injury) (HCC)  Assessment and Plan: Acute on chronic diastolic CHF : continue on torsemide. Lasix drip was d/c. Monitor I/Os. Pt did not previously tolerate BB or ARB. Nursing staff states that she is noncompliant with fluid restriction. Cardio consulted    Anasarca: continue on torsemide. Monitor I/Os   DM2: poorly controlled, HbA1c 12.5. Continue on levemir, aspart, SSI w/ accuchecks    HLD: continue on statin  Morbid obesity: BMI 47.3. Complicates overall care & prognosis    AKI: Cr is labile. Avoid nephrotoxic meds   Hx of CVA: continue on aspirin, statin    Constipation: continue on lactulose, miralax   Chronic pain: pt requested to be changed to buprenorphine instead of suboxone TID. Management per outpatient physician, Dr. Jodi Marble       DVT prophylaxis: lovenox  Code Status: Full  Family Communication:  Disposition Plan: likely d/c home   Level of care: Progressive  Status is: Inpatient Remains inpatient appropriate because: severity of illness    Consultants:  Cardio   Procedures:  Antimicrobials:    Subjective: Pt c/o feeling swollen and generalized pain   Objective: Vitals:   11/29/21 1700 11/29/21 1926 11/29/21 2327 11/30/21 0351  BP: (!) 148/96 (!) 146/99 (!) 143/66 119/63  Pulse: 80 98 91 76  Resp: 20 18 18 16   Temp: 98.4  F (36.9 C) 97.7 F (36.5 C) 97.9 F (36.6 C) 97.8 F (36.6 C)  TempSrc: Oral   Oral  SpO2: 100% 99% 98% 98%  Weight:      Height:       No intake or output data in the 24 hours ending 11/30/21 0821 Filed Weights   11/27/21 0413 11/28/21 0902 11/29/21 1044  Weight: 106.6 kg 106.1 kg 106.4 kg    Examination:  General exam: Appears calm but uncomfortable  Respiratory system: decreased breath sounds b/l  Cardiovascular system: S1 & S2+. No  rubs, gallops or clicks.  Gastrointestinal system: Abdomen is obese, soft and nontender.Hypoactive bowel sounds heard. Central nervous system: Lethargic. Moves all extremities  Psychiatry: Judgement and insight appears poor. Flat mood and affect     Data Reviewed: I have personally reviewed following labs and imaging studies  CBC: Recent Labs  Lab 11/25/21 1632 11/26/21 0510 11/28/21 0501 11/30/21 0358  WBC 12.2* 10.2 12.7* 12.5*  HGB 11.8* 11.4* 12.6 12.1  HCT 37.9 36.3 40.1 39.5  MCV 81.9 81.4 81.2 82.8  PLT 287 267 308 307   Basic Metabolic Panel: Recent Labs  Lab 11/26/21 0510 11/27/21 0733 11/28/21 0501 11/29/21 0458 11/30/21 0358  NA 136 131* 132* 134* 135  K 4.1 3.6 3.9 3.8 3.9  CL 102 85* 84* 88* 93*  CO2 29 37* 38* 37* 34*  GLUCOSE 307* 321* 433* 277* 222*  BUN 22* 36* 51* 55* 59*  CREATININE 0.71 1.07* 1.35* 1.20* 1.34*  CALCIUM 8.0* 9.0 8.7* 8.2*  8.8*  MG  --  2.0  --   --   --    GFR: Estimated Creatinine Clearance: 56.7 mL/min (A) (by C-G formula based on SCr of 1.34 mg/dL (H)). Liver Function Tests: Recent Labs  Lab 11/30/21 0358  AST 17  ALT 16  ALKPHOS 54  BILITOT 0.5  PROT 6.7  ALBUMIN 3.3*   No results for input(s): "LIPASE", "AMYLASE" in the last 168 hours. No results for input(s): "AMMONIA" in the last 168 hours. Coagulation Profile: No results for input(s): "INR", "PROTIME" in the last 168 hours. Cardiac Enzymes: No results for input(s): "CKTOTAL", "CKMB", "CKMBINDEX", "TROPONINI" in  the last 168 hours. BNP (last 3 results) No results for input(s): "PROBNP" in the last 8760 hours. HbA1C: No results for input(s): "HGBA1C" in the last 72 hours. CBG: Recent Labs  Lab 11/29/21 1211 11/29/21 1702 11/29/21 2117 11/30/21 0518 11/30/21 0809  GLUCAP 243* 198* 203* 192* 255*   Lipid Profile: No results for input(s): "CHOL", "HDL", "LDLCALC", "TRIG", "CHOLHDL", "LDLDIRECT" in the last 72 hours. Thyroid Function Tests: No results for input(s): "TSH", "T4TOTAL", "FREET4", "T3FREE", "THYROIDAB" in the last 72 hours. Anemia Panel: No results for input(s): "VITAMINB12", "FOLATE", "FERRITIN", "TIBC", "IRON", "RETICCTPCT" in the last 72 hours. Sepsis Labs: No results for input(s): "PROCALCITON", "LATICACIDVEN" in the last 168 hours.  No results found for this or any previous visit (from the past 240 hour(s)).       Radiology Studies: No results found.      Scheduled Meds:  ALPRAZolam  0.5 mg Oral Once   aspirin EC  81 mg Oral Daily   atorvastatin  10 mg Oral Daily   buprenorphine  8 mg Sublingual TID   enoxaparin (LOVENOX) injection  0.5 mg/kg Subcutaneous Q24H   famotidine  20 mg Oral Daily   insulin aspart  0-20 Units Subcutaneous TID WC   insulin aspart  14 Units Subcutaneous TID WC   insulin detemir  55 Units Subcutaneous QHS   lactulose  30 g Oral BID   magnesium oxide  400 mg Oral BID   potassium chloride  40 mEq Oral BID   torsemide  40 mg Oral BID   Continuous Infusions:  sodium chloride       LOS: 4 days    Time spent: 35 mins     Charise Killian, MD [facility] Pager [phone]  If 7PM-7AM, please contact night-coverage [email or web address] 11/30/2021, 8:21 AM

## 2021-11-30 NOTE — Consult Note (Addendum)
Cardiology Consult    Patient ID: RIKITA GRABERT MRN: [id number], DOB/AGE: 06-26-1975   Admit date: 11/25/2021 Date of Consult: 11/30/2021  Primary Physician: [facility] Primary Cardiologist: Debbe Odea, MD Requesting Provider: Wilfred Lacy  Patient Profile    Caitlyn Fuller is a 46 y.o. female with a history of polysubstance abuse, diabetes, seizure disorder, morbid obesity, hypertension, and chronic heart failure with preserved ejection fraction who is being seen today for the evaluation of CHF exacerbation at the request of Dr. Mayford Knife.   Past Medical History   Past Medical History:  Diagnosis Date   Asthma    Chronic heart failure with preserved ejection fraction (HFpEF) (HCC)    a. 04/2021 Echo: EF 65-70%, no rwma, GrI DD, mildly dil LA; b. 11/2021 Echo: EF 60-65%, no rwma, mod LVH, GrII DD, nl RV fxn, mildly dil LA, mild MR.   Diabetes mellitus without complication (HCC)    Hernia    Hypertension    Kidney infection    Morbid obesity (HCC)    Shingles     Past Surgical History:  Procedure Laterality Date   ABDOMINAL SURGERY     CESAREAN SECTION     CHOLECYSTECTOMY     colonscopy     TUBAL LIGATION       Allergies  Allergies  Allergen Reactions   Amlodipine Swelling   Gabapentin Anaphylaxis   Morphine And Related Anaphylaxis   Darvocet [Propoxyphene N-Acetaminophen] Hives   Toradol [Ketorolac Tromethamine] Other (See Comments)    migraines   Ultram [Tramadol] Hives   Vancomycin Itching    History of Present Illness    Caitlyn Fuller is a 46 y.o. female with a history of polysubstance abuse, diabetes, seizure disorder, morbid obesity, hypertension, and chronic heart failure with preserved ejection fraction.  She was recently admitted to [facility] on 11/16/21 for shortness of breath and lower leg edema.  An echocardiogram was completed during that visit that suggested a LVEF of 60-65% with moderate LV hypertrophy and Gr2  DD. Patient was discharged on 11/22/21 after being diuresed with a discharge weight of  105.8 kg.  During the course of her stay, she was unable to tolerate any medications besides Lasix.  It was recommended that she follow-up with the heart failure clinic after discharge.  Following discharge, pt says that she took her medications as Rx and watched salt intake very closely.  She says that she only eats 1 piece of grilled chicken daily and that though it comes from a restaurant, her girlfriend works at [employer] and prepares it for her w/o salt.  She says that she eats very little and does not eat any processed foods.  Patient returned back to [facility] on 11/25/21 complaining of increased shortness of breath, edema, and weight gain of over 30 lbs since discharge (8.8 lbs wt gain documented here).  Her reported weight on admission was 110.2 kg.  She states she was taking the "fluid pills" at home but they were not working (lasix 40 daily).  She says that her breathing has worsened and that her legs became more swollen within one day of being discharged, so she came back.  She was started on IV Lasix and switched to Lasix drip on 11/18 following a dose of metolazone and IV lasix.  Creatinine went from 1.01 on admission to 1.35 so Lasix gtt was discontinued on 11/20 and she was placed on torsemide.  She is now taking torsemide 40 mg BID.  She was provided a dose of acetazolamide on 11/21.  Pt says that she remains markedly volume overloaded despite medical team efforts.  Creatinine today is 1.34 w/ BUN of 59 (21 on arrival) and bicarb of 34 (30 on arrival).  Patient states that the "two pills" she is being given are making her very tired.  he admits to throwing these pills in the trash yesterday, but is not sure what pills these were.  She did receive xanax 0.5 mg this AM in addition to subutex 8mg  TID.  SShe denies chest pain, palpitations, dizziness/lightheadedness, syncope.  She became tearful upon abdominal  exam 2/2 pain w/ light palpation.  Inpatient Medications     aspirin EC  81 mg Oral Daily   atorvastatin  10 mg Oral Daily   buprenorphine  8 mg Sublingual TID   enoxaparin (LOVENOX) injection  0.5 mg/kg Subcutaneous Q24H   famotidine  20 mg Oral Daily   insulin aspart  0-20 Units Subcutaneous TID WC   insulin aspart  14 Units Subcutaneous TID WC   insulin detemir  55 Units Subcutaneous QHS   lactulose  30 g Oral BID   magnesium oxide  400 mg Oral BID   potassium chloride  40 mEq Oral BID   torsemide  40 mg Oral BID    Family History    Family History  Problem Relation Age of Onset   Cancer Mother    Heart failure Father    She indicated that the status of her mother is unknown. She indicated that the status of her father is unknown.   Social History    Social History   Socioeconomic History   Marital status: Single    Spouse name: Not on file   Number of children: Not on file   Years of education: Not on file   Highest education level: Not on file  Occupational History   Not on file  Tobacco Use   Smoking status: Every Day    Packs/day: 0.50    Types: Cigarettes   Smokeless tobacco: Never  Vaping Use   Vaping Use: Never used  Substance and Sexual Activity   Alcohol use: No   Drug use: Yes    Types: "Crack" cocaine   Sexual activity: Not on file  Other Topics Concern   Not on file  Social History Narrative   Not on file   Social Determinants of Health   Financial Resource Strain: Not on file  Food Insecurity: No Food Insecurity (11/26/2021)   Hunger Vital Sign    Worried About Running Out of Food in the Last Year: Never true    Ran Out of Food in the Last Year: Never true  Transportation Needs: No Transportation Needs (11/26/2021)   PRAPARE - Administrator, Civil ServiceTransportation    Lack of Transportation (Medical): No    Lack of Transportation (Non-Medical): No  Physical Activity: Not on file  Stress: Not on file  Social Connections: Not on file  Intimate Partner  Violence: Not At Risk (11/26/2021)   Humiliation, Afraid, Rape, and Kick questionnaire    Fear of Current or Ex-Partner: No    Emotionally Abused: No    Physically Abused: No    Sexually Abused: No     Review of Systems    General:  No chills, fever, night sweats. +++ weight changes Cardiovascular:  +++ dyspnea on exertion, +++ edema. No chest pain, orthopnea, palpitations, paroxysmal nocturnal dyspnea. Dermatological: No rash, lesions/masses Respiratory: No cough, +++ dyspnea Urologic: No hematuria,  dysuria Abdominal:   +++ abdominal tenderness.  +++ constipation.  No nausea, vomiting, diarrhea, bright red blood per rectum, melena, or hematemesis Neurologic:  +++ c/o somnolence.  No visual changes, wkns, changes in mental status. All other systems reviewed and are otherwise negative except as noted above.  Physical Exam    Blood pressure 117/76, pulse 79, temperature 97.8 F (36.6 C), temperature source Oral, resp. rate 10, height 4\' 11"  (1.499 m), weight 106.4 kg, SpO2 92 %.  General: NAD Psych: Agitated, intermittently tearful. Neuro: Alert and oriented X 3. Moves all extremities spontaneously. HEENT: Normal  Neck: Supple without bruits.  Unable to determine JVP 2/2 body habitus. Lungs:  Resp regular and unlabored.  Diminished lung sounds.  Heart: RRR, distant, no s3, s4, or murmurs. Abdomen: obese, soft, diffusely tender to light palpation. BS + x 4.  Extremities: No clubbing or cyanosis. PT2+, Radials 2+ and equal bilaterally.  Trace bilat lower ext edema.  Labs    Cardiac Enzymes Recent Labs  Lab 11/16/21 1552 11/16/21 1713 11/16/21 2055 11/25/21 1632 11/25/21 1957  TROPONINIHS 87* 92* 109* 19* 20*     BNP    Component Value Date/Time   BNP 192.6 (H) 11/25/2021 1631   Lab Results  Component Value Date   WBC 12.5 (H) 11/30/2021   HGB 12.1 11/30/2021   HCT 39.5 11/30/2021   MCV 82.8 11/30/2021   PLT 307 11/30/2021    Recent Labs  Lab 11/30/21 0358  NA  135  K 3.9  CL 93*  CO2 34*  BUN 59*  CREATININE 1.34*  CALCIUM 8.8*  PROT 6.7  BILITOT 0.5  ALKPHOS 54  ALT 16  AST 17  GLUCOSE 222*   Lab Results  Component Value Date   CHOL 142 11/17/2021   HDL 48 11/17/2021   LDLCALC 43 11/17/2021   TRIG 253 (H) 11/17/2021    Radiology Studies    13/09/2021 Abdomen Limited  Result Date: 11/26/2021 CLINICAL DATA:  Evaluate for ascites EXAM: LIMITED ABDOMEN ULTRASOUND FOR ASCITES TECHNIQUE: Limited ultrasound survey for ascites was performed in all four abdominal quadrants. COMPARISON:  None Available. FINDINGS: No ascites identified. IMPRESSION: No ascites identified. Electronically Signed   By: 11/28/2021 III M.D.   On: 11/26/2021 11:27   DG Chest 2 View  Result Date: 11/25/2021 CLINICAL DATA:  Shortness of breath.  Bilateral leg swelling. EXAM: CHEST - 2 VIEW COMPARISON:  Chest two views 11/16/2021 FINDINGS: Cardiac silhouette and mediastinal contours are within normal limits. The lungs are clear. No pleural effusion or pneumothorax. Mild-to-moderate multilevel degenerative disc changes of the thoracic spine. Cholecystectomy clips. IMPRESSION: No active cardiopulmonary disease. Electronically Signed   By: 13/08/2021 M.D.   On: 11/25/2021 17:00   ECG & Cardiac Imaging    RSR, 91, no acute ST/T changes - personally reviewed.  Assessment & Plan    1.  Acute on chronic HFpEF: Patient recently admitted to [facility] earlier this month for shortness of breath and LE edema.  Echocardiogram suggested a LVEF of 60-65% with moderate LV hypertrophy and Gr2 DD.  She was diuresed with IV Lasix and discharged with recommendation to follow-up with heart failure clinic.  She arrived back at [facility] 3 days after being discharged complaining of increased weight, shortness of breath, and lower extremity edema.  She had an increase in weight of 4.4 kg based on reported wt (0.8kg difference based on first recorded weight on 11/19).  She has been diuresed with IV Lasix  and  Lasix drip but this was discontinued in the setting of creatinine increase (1.01  1.35).  She is still complaining of dyspnea on exertion and marked upper/lower ext and abdominal swelling.  On exam, she does not appear to be significantly volume overloaded, with only trace lower ext edema, however, exam is very challenging due to body habitus.  I/O inaccurate b/c she is not saving urine and apparently drinking a lot of fluid that nsg cannot account for.  BNP was 60.6 in 09/2021, 132.7 on last admission, and 192.6 on this admission.  We discussed possible pursuit of a RHC to better understand her volume status, however she adamantly refuses.  In that setting, will f/u a BNP in the AM.  Cont torsemide 40 BID.  I'm going to add metolazone 2.5mg  to her dose in the AM (already received torsemide tonight).  Adding spironolactone 12.5 mg daily w/ eye to titrate as BP allows.  Likely a poor candidate for SGLT2i b/c of body habitus and risk for yeast infxns.  2. Hypertension: Blood pressure within goal (117/76).  Follow w/ diuresis and low dose MRA.  3. Hyperlipidemia: LDL 43 from 11/17/21. Continue statin.   4.  Somnolence:  This is pts biggest complaint today.  She says that something she is taking is causing her to sleep all day and night.  She initially struggled to keep her eyes open during our interview.  Review of meds shows that she received xanax this AM and has also been getting subutex 8 mg TID.  The former is almost certainly contributing and the latter can cause fatigue and drowsiness in >5% of individuals.  The combination of the two may increase CNS depression.  Recommend trying alternatives.  5.  Probable OHS/OSA:  needs outpt sleep study.  Likely contributing to somnolence.  6.  AKI:  creat variable.  Follow w/ ongoing diuretic therapy.  7. Supply:Demand Ischemia:  Mild HsTrop elevation to a peak of 109 during last admission.  20 on this admission.  Denies chest pain.  Nl EF by echo w/o rwma.   No Cor Ca2+ on CT of chest in 09/2016.  Can consider outpt isch eval, however body habitus would make noninvasive testing very challenging.  Perhaps she'd be a candidate for Lexiscan PET CT.  Risk Assessment/Risk Scores:        New York Heart Association (NYHA) Functional Class NYHA Class III    Signed, Nicolasa Ducking, NP 11/30/2021, 4:49 PM  For questions or updates, please contact   Please consult [email or web address] for contact info under Cardiology/STEMI.

## 2021-12-01 DIAGNOSIS — E1169 Type 2 diabetes mellitus with other specified complication: Secondary | ICD-10-CM

## 2021-12-01 LAB — CBC
HCT: 39.4 % (ref 36.0–46.0)
Hemoglobin: 12.3 g/dL (ref 12.0–15.0)
MCH: 25.7 pg — ABNORMAL LOW (ref 26.0–34.0)
MCHC: 31.2 g/dL (ref 30.0–36.0)
MCV: 82.3 fL (ref 80.0–100.0)
Platelets: 318 10*3/uL (ref 150–400)
RBC: 4.79 MIL/uL (ref 3.87–5.11)
RDW: 13.7 % (ref 11.5–15.5)
WBC: 11.6 10*3/uL — ABNORMAL HIGH (ref 4.0–10.5)
nRBC: 0 % (ref 0.0–0.2)

## 2021-12-01 LAB — BASIC METABOLIC PANEL
Anion gap: 8 (ref 5–15)
BUN: 66 mg/dL — ABNORMAL HIGH (ref 6–20)
CO2: 32 mmol/L (ref 22–32)
Calcium: 8.7 mg/dL — ABNORMAL LOW (ref 8.9–10.3)
Chloride: 94 mmol/L — ABNORMAL LOW (ref 98–111)
Creatinine, Ser: 1.29 mg/dL — ABNORMAL HIGH (ref 0.44–1.00)
GFR, Estimated: 52 mL/min — ABNORMAL LOW (ref >60)
Glucose, Bld: 255 mg/dL — ABNORMAL HIGH (ref 70–99)
Potassium: 3.9 mmol/L (ref 3.5–5.1)
Sodium: 134 mmol/L — ABNORMAL LOW (ref 135–145)

## 2021-12-01 LAB — GLUCOSE, CAPILLARY: Glucose-Capillary: 259 mg/dL — ABNORMAL HIGH (ref 70–99)

## 2021-12-01 LAB — BRAIN NATRIURETIC PEPTIDE: B Natriuretic Peptide: 26.8 pg/mL (ref 0.0–100.0)

## 2021-12-01 NOTE — Discharge Summary (Addendum)
Physician Discharge Summary  Caitlyn Fuller UEA:[id number] DOB: 08-10-1975 DOA: 11/25/2021  PCP[facility]  Admit date: 11/25/2021 Discharge date: 12/08/2021  Admitted From: home  Disposition:  Pt left AMA   Recommendations for Outpatient Follow-up:  Pt left AMA   Home Health: no  Equipment/Devices:  Discharge Condition: Pt left AMA CODE STATUS: full  Diet recommendation: Heart Healthy w/ fluid restriction / Carb Modified   Brief/Interim Summary: HPI was taken from Dr. Margette Fast: Caitlyn Fuller is a 46 y.o. female with medical history significant of chronic pain syndrome, obesity, poorly controlled diabetes mellitus, asthma who was recently admitted and treated for fluid overload secondary to grade 2 diastolic CHF.  As per patient, she was discharged on p.o. Lasix and has been compliant with medication.  She however has noted worsening lower extremity edema, abdominal distention and weight gain.  She decided to come to the emergency room to be further evaluated.  She admits to drinking about 2 L of water per day.  She is yet to make appointment to cardiology clinic.  She admits to paroxysmal nocturnal dyspnea and orthopnea but denied any chest pain or palpitations.  She denies any abdominal pain, nausea or vomiting at this time.   As per Dr. Renae Gloss: 46 year old female with past medical history of diastolic congestive function, morbid obesity, type 2 diabetes mellitus with hyperglycemia, hypertension, chronic pain presents back to the hospital after gaining a lot of weight back.   Patient was on Lasix drip for few days but creatinine started to rise.  Patient switched over to torsemide twice daily dosing.  Stressed the importance of fluid restriction.  Dose of acetazolamide given on 11/29/2021.   As per Dr. Mayford Knife 11/22-11/23/23: Pt was on torsemide for CHF when I started to see the pt. Pt was not making much improvement so cardio was consulted. Cardio  changed her diuretics to metolazone and lasix. Pt was on fluid restriction but was noncompliant as per pt's nurse. Pt was told by nurse that she was not following her fluid restriction but pt did not agree. A verbal argument occurred between the pt's nurse and pt and as a result, pt decided to leave AMA.    Discharge Diagnoses:  Active Problems:   Acute on chronic diastolic CHF (congestive heart failure) (HCC)   Anasarca   Uncontrolled type 2 diabetes mellitus with hyperglycemia, with long-term current use of insulin (HCC)   Hyperlipidemia   Obesity, Class III, BMI 40-49.9 (morbid obesity) (HCC)   Chronic pain   Constipation   History of CVA (cerebrovascular accident)   AKI (acute kidney injury) (HCC)   Primary hypertension   Snoring  Acute on chronic diastolic CHF : started on metolazone & lasix yesterday as per cardio.  Lasix drip was d/c. Monitor I/Os. Pt did not previously tolerate BB or ARB. Nursing staff states that she is noncompliant with fluid restriction. Cardio following and recs apprec   Anasarca: continue on metolazone, lasix    DM2: poorly controlled, HbA1c 12.5. Continue on levemir, aspart, SSI w/ accuchecks    HLD: continue on statin  Morbid obesity: BMI 47.3. Complicates overall care & prognosis    AKI: Cr is labile. Avoid nephrotoxic meds   Hx of CVA: continue on aspirin, statin    Constipation: continue on lactulose, miralax   Chronic pain: pt requested to be changed to buprenorphine instead of suboxone TID as per previous hospitalist. Management per outpatient physician, Dr. Jodi Marble   Discharge Instructions Pt left  AMA     Allergies  Allergen Reactions   Amlodipine Swelling   Gabapentin Anaphylaxis   Morphine And Related Anaphylaxis   Darvocet [Propoxyphene N-Acetaminophen] Hives   Toradol [Ketorolac Tromethamine] Other (See Comments)    migraines   Ultram [Tramadol] Hives   Vancomycin Itching    Consultations: Cardio     Procedures/Studies: US Abdomen Limited  Result Date: 11/26/2021 CLINICAL DATA:  Evaluate for ascites EXAM: LIMITED ABDOMEN ULTRASOUND FOR ASCITES TECHNIQUE: Limited ultrasound survey for ascites was performed in all four abdominal quadrants. COMPARISON:  None Available. FINDINGS: No ascites identified. IMPRESSION: No ascites identified. Electronically Signed   By: Dorise Bullion III M.D.   On: 11/26/2021 11:27   DG Chest 2 View  Result Date: 11/25/2021 CLINICAL DATA:  Shortness of breath.  Bilateral leg swelling. EXAM: CHEST - 2 VIEW COMPARISON:  Chest two views 11/16/2021 FINDINGS: Cardiac silhouette and mediastinal contours are within normal limits. The lungs are clear. No pleural effusion or pneumothorax. Mild-to-moderate multilevel degenerative disc changes of the thoracic spine. Cholecystectomy clips. IMPRESSION: No active cardiopulmonary disease. Electronically Signed   By: Yvonne Kendall M.D.   On: 11/25/2021 17:00   CT HEAD WO CONTRAST (5MM)  Result Date: 11/21/2021 CLINICAL DATA:  Vertigo EXAM: CT HEAD WITHOUT CONTRAST TECHNIQUE: Contiguous axial images were obtained from the base of the skull through the vertex without intravenous contrast. RADIATION DOSE REDUCTION: This exam was performed according to the departmental dose-optimization program which includes automated exposure control, adjustment of the mA and/or kV according to patient size and/or use of iterative reconstruction technique. COMPARISON:  05/02/2021 FINDINGS: Brain: No acute intracranial findings are seen. There are no signs of bleeding within the cranium. There is a small subcentimeter old lacunar infarct in right basal ganglia with no change. There is small subcentimeter old lacunar infarct in the right periventricular region in the parietal lobe with no interval change. There is interval resolution of a small 7 mm cystic focus seen in the mesial left temporal lobe since 05/02/2021. Ventricles are not dilated.  Vascular: Unremarkable. Skull: Unremarkable. Sinuses/Orbits: Unremarkable. Other: None. IMPRESSION: No acute intracranial findings are seen in noncontrast CT brain. Small subcentimeter old lacunar infarcts in right MCA distribution have not changed. There is interval resolution of his 7 mm cystic focus seen in the mesial left temporal lobe since 05/02/2021. Electronically Signed   By: Elmer Picker M.D.   On: 11/21/2021 12:03   ECHOCARDIOGRAM COMPLETE  Result Date: 11/19/2021    ECHOCARDIOGRAM REPORT   Patient Name:   JADAH GABBARD Date of Exam: 11/19/2021 Medical Rec #:  [id number]           Height:       58.0 in Accession #:    [id number]          Weight:       232.8 lb Date of Birth:  January 08, 1976           BSA:          1.943 m Patient Age:    46 years            BP:           144/93 mmHg Patient Gender: F                   HR:           78 bpm. Exam Location:  [facility] Procedure: 2D Echo Indications:     CHF I50.31  History:  Patient has no prior history of Echocardiogram examinations.  Sonographer:     Kathlen Brunswick RDCS Referring Phys:  [id number] Loletha Grayer Diagnosing Phys: Ida Rogue MD  Sonographer Comments: Technically difficult study due to poor echo windows, suboptimal parasternal window, suboptimal subcostal window and patient is obese. Image acquisition challenging due to patient body habitus and Image acquisition challenging due to respiratory motion. IMPRESSIONS  1. Left ventricular ejection fraction, by estimation, is 60 to 65%. The left ventricle has normal function. The left ventricle has no regional wall motion abnormalities. There is moderate left ventricular hypertrophy. Left ventricular diastolic parameters are consistent with Grade II diastolic dysfunction (pseudonormalization).  2. Right ventricular systolic function is normal. The right ventricular size is normal.  3. Left atrial size was mildly dilated.  4. The mitral valve was not well visualized. Mild mitral  valve regurgitation. No evidence of mitral stenosis.  5. The aortic valve was not well visualized. Aortic valve regurgitation is not visualized. No aortic stenosis is present.  6. The inferior vena cava is normal in size with greater than 50% respiratory variability, suggesting right atrial pressure of 3 mmHg. FINDINGS  Left Ventricle: Left ventricular ejection fraction, by estimation, is 60 to 65%. The left ventricle has normal function. The left ventricle has no regional wall motion abnormalities. The left ventricular internal cavity size was normal in size. There is  moderate left ventricular hypertrophy. Left ventricular diastolic parameters are consistent with Grade II diastolic dysfunction (pseudonormalization). Right Ventricle: The right ventricular size is normal. No increase in right ventricular wall thickness. Right ventricular systolic function is normal. Left Atrium: Left atrial size was mildly dilated. Right Atrium: Right atrial size was normal in size. Pericardium: There is no evidence of pericardial effusion. Mitral Valve: The mitral valve was not well visualized. Mild mitral valve regurgitation. No evidence of mitral valve stenosis. Tricuspid Valve: The tricuspid valve is not well visualized. Tricuspid valve regurgitation is not demonstrated. No evidence of tricuspid stenosis. Aortic Valve: The aortic valve was not well visualized. Aortic valve regurgitation is not visualized. No aortic stenosis is present. Aortic valve peak gradient measures 8.9 mmHg. Pulmonic Valve: The pulmonic valve was not well visualized. Pulmonic valve regurgitation is not visualized. No evidence of pulmonic stenosis. Aorta: The aortic root is normal in size and structure. Venous: The inferior vena cava is normal in size with greater than 50% respiratory variability, suggesting right atrial pressure of 3 mmHg. IAS/Shunts: No atrial level shunt detected by color flow Doppler.  LEFT VENTRICLE PLAX 2D LVIDd:         4.20 cm       Diastology LVIDs:         3.00 cm      LV e' medial:    5.66 cm/s LV PW:         1.70 cm      LV E/e' medial:  19.6 LV IVS:        1.60 cm      LV e' lateral:   5.00 cm/s LVOT diam:     2.10 cm      LV E/e' lateral: 22.2 LV SV:         69 LV SV Index:   35 LVOT Area:     3.46 cm  LV Volumes (MOD) LV vol d, MOD A4C: 109.0 ml LV vol s, MOD A4C: 43.8 ml LV SV MOD A4C:     109.0 ml RIGHT VENTRICLE RV Basal diam:  2.80 cm RV S prime:  15.80 cm/s LEFT ATRIUM           Index        RIGHT ATRIUM          Index LA diam:      4.30 cm 2.21 cm/m   RA Area:     9.59 cm LA Vol (A4C): 36.6 ml 18.84 ml/m  RA Volume:   17.50 ml 9.01 ml/m  AORTIC VALVE                 PULMONIC VALVE AV Area (Vmax): 2.32 cm     PV Vmax:       1.14 m/s AV Vmax:        149.00 cm/s  PV Peak grad:  5.2 mmHg AV Peak Grad:   8.9 mmHg LVOT Vmax:      99.80 cm/s LVOT Vmean:     65.700 cm/s LVOT VTI:       0.199 m  AORTA Ao Root diam: 3.00 cm MITRAL VALVE MV Area (PHT): 4.41 cm     SHUNTS MV Decel Time: 172 msec     Systemic VTI:  0.20 m MV E velocity: 111.00 cm/s  Systemic Diam: 2.10 cm MV A velocity: 100.00 cm/s MV E/A ratio:  1.11 Ida Rogue MD Electronically signed by Ida Rogue MD Signature Date/Time: 11/19/2021/11:36:20 AM    Final    DG Lumbar Spine 2-3 Views  Result Date: 11/18/2021 CLINICAL DATA:  Low back pain. EXAM: LUMBAR SPINE - 2-3 VIEW COMPARISON:  None Available. FINDINGS: There is no evidence of lumbar spine fracture. Alignment is normal. Minimal degenerative disc disease in the lower lumbosacral spine. Minimal posterior facet arthropathy at L4-L5 and L5-S1. IMPRESSION: 1. Minimal degenerative disc disease in the lower lumbosacral spine. 2. Minimal posterior facet arthropathy at L4-L5 and L5-S1. Electronically Signed   By: Fidela Salisbury M.D.   On: 11/18/2021 11:03   DG Chest 2 View  Result Date: 11/16/2021 CLINICAL DATA:  Shortness of breath EXAM: CHEST - 2 VIEW COMPARISON:  Chest x-ray 09/25/2021 FINDINGS: The  heart size and mediastinal contours are within normal limits. Both lungs are clear. The visualized skeletal structures are unremarkable. IMPRESSION: No active cardiopulmonary disease. Electronically Signed   By: Donavan Foil M.D.   On: 11/16/2021 17:14   (Echo, Carotid, EGD, Colonoscopy, ERCP)    Subjective: Pt was very agitated and frustrated in regards to her fluid intake.    Discharge Exam: Vitals:   12/01/21 0346 12/01/21 0744  BP: 120/65 125/64  Pulse: 93 77  Resp: 16 18  Temp: 98.3 F (36.8 C) 98 F (36.7 C)  SpO2: 98% 95%   Vitals:   11/30/21 1918 12/01/21 0002 12/01/21 0346 12/01/21 0744  BP: 120/64 (!) 145/91 120/65 125/64  Pulse: 91 90 93 77  Resp: 17 17 16 18   Temp: 98.5 F (36.9 C) 98.1 F (36.7 C) 98.3 F (36.8 C) 98 F (36.7 C)  TempSrc:  Oral    SpO2: 97% 97% 98% 95%  Weight:      Height:        No PE was done today as pt left AMA.     The results of significant diagnostics from this hospitalization (including imaging, microbiology, ancillary and laboratory) are listed below for reference.     Microbiology: No results found for this or any previous visit (from the past 240 hour(s)).   Labs: BNP (last 3 results) Recent Labs    11/16/21 1552 11/25/21 1631 12/01/21 0532  BNP  132.7* 192.6* 0000000   Basic Metabolic Panel: No results for input(s): "NA", "K", "CL", "CO2", "GLUCOSE", "BUN", "CREATININE", "CALCIUM", "MG", "PHOS" in the last 168 hours.  Liver Function Tests: No results for input(s): "AST", "ALT", "ALKPHOS", "BILITOT", "PROT", "ALBUMIN" in the last 168 hours.  No results for input(s): "LIPASE", "AMYLASE" in the last 168 hours. No results for input(s): "AMMONIA" in the last 168 hours. CBC: No results for input(s): "WBC", "NEUTROABS", "HGB", "HCT", "MCV", "PLT" in the last 168 hours.  Cardiac Enzymes: No results for input(s): "CKTOTAL", "CKMB", "CKMBINDEX", "TROPONINI" in the last 168 hours. BNP: Invalid input(s): "POCBNP" CBG: No  results for input(s): "GLUCAP" in the last 168 hours.  D-Dimer No results for input(s): "DDIMER" in the last 72 hours. Hgb A1c No results for input(s): "HGBA1C" in the last 72 hours. Lipid Profile No results for input(s): "CHOL", "HDL", "LDLCALC", "TRIG", "CHOLHDL", "LDLDIRECT" in the last 72 hours. Thyroid function studies No results for input(s): "TSH", "T4TOTAL", "T3FREE", "THYROIDAB" in the last 72 hours.  Invalid input(s): "FREET3" Anemia work up No results for input(s): "VITAMINB12", "FOLATE", "FERRITIN", "TIBC", "IRON", "RETICCTPCT" in the last 72 hours. Urinalysis    Component Value Date/Time   COLORURINE YELLOW (A) 11/16/2021 1713   APPEARANCEUR CLEAR (A) 11/16/2021 1713   APPEARANCEUR Clear 08/01/2013 1655   LABSPEC 1.017 11/16/2021 1713   LABSPEC 1.026 08/01/2013 1655   PHURINE 5.0 11/16/2021 1713   GLUCOSEU >=500 (A) 11/16/2021 1713   GLUCOSEU >=500 08/01/2013 1655   HGBUR LARGE (A) 11/16/2021 1713   BILIRUBINUR NEGATIVE 11/16/2021 1713   BILIRUBINUR Negative 08/01/2013 1655   KETONESUR NEGATIVE 11/16/2021 1713   PROTEINUR 30 (A) 11/16/2021 1713   UROBILINOGEN 0.2 11/01/2014 2153   NITRITE NEGATIVE 11/16/2021 1713   LEUKOCYTESUR NEGATIVE 11/16/2021 1713   LEUKOCYTESUR Negative 08/01/2013 1655   Sepsis Labs No results for input(s): "WBC" in the last 168 hours.  Invalid input(s): "PROCALCITONIN", "LACTICIDVEN"  Microbiology No results found for this or any previous visit (from the past 240 hour(s)).   Time coordinating discharge: Over 30 minutes  SIGNED:   Wyvonnia Dusky, MD  [facility] 12/08/2021, 4:44 PM Pager   If 7PM-7AM, please contact night-coverage [email or web address]

## 2021-12-01 NOTE — Progress Notes (Signed)
Rounding Note    Patient Name: Caitlyn Fuller Date of Encounter: 12/01/2021  [facility] Cardiologist: None   Subjective   States feeling dizzy, does not like her current prescribed dose of Suboxone.  Has net -1.2 L over the past 24 hours.    Inpatient Medications    Scheduled Meds:  aspirin EC  81 mg Oral Daily   atorvastatin  10 mg Oral Daily   buprenorphine  8 mg Sublingual TID   enoxaparin (LOVENOX) injection  0.5 mg/kg Subcutaneous Q24H   famotidine  20 mg Oral Daily   furosemide  40 mg Intravenous BID   insulin aspart  0-20 Units Subcutaneous TID WC   insulin aspart  14 Units Subcutaneous TID WC   insulin detemir  55 Units Subcutaneous QHS   lactulose  30 g Oral BID   magnesium oxide  400 mg Oral BID   metolazone  5 mg Oral Daily   potassium chloride  40 mEq Oral BID   spironolactone  12.5 mg Oral Daily   Continuous Infusions:  sodium chloride     PRN Meds: acetaminophen, albuterol, nitroGLYCERIN, ondansetron (ZOFRAN) IV, mouth rinse, polyethylene glycol   Vital Signs    Vitals:   11/30/21 1918 12/01/21 0002 12/01/21 0346 12/01/21 0744  BP: 120/64 (!) 145/91 120/65 125/64  Pulse: 91 90 93 77  Resp: 17 17 16 18   Temp: 98.5 F (36.9 C) 98.1 F (36.7 C) 98.3 F (36.8 C) 98 F (36.7 C)  TempSrc:  Oral    SpO2: 97% 97% 98% 95%  Weight:      Height:        Intake/Output Summary (Last 24 hours) at 12/01/2021 1107 Last data filed at 12/01/2021 1000 Gross per 24 hour  Intake 1200 ml  Output 2400 ml  Net -1200 ml      11/29/2021   10:44 AM 11/28/2021    9:02 AM 11/27/2021    4:13 AM  Last 3 Weights  Weight (lbs) 234 lb 8 oz 233 lb 14.4 oz 235 lb 0.2 oz  Weight (kg) 106.369 kg 106.096 kg 106.6 kg      Telemetry    Sinus rhythm- Personally Reviewed  ECG     - Personally Reviewed  Physical Exam   GEN:  Well nourished HEENT: Normal NECK: Difficult to assess JVD due to body habitus CARDIAC: RRR, no murmurs, rubs,  gallops RESPIRATORY:  Clear to auscultation without rales, wheezing or rhonchi  ABDOMEN: Soft, non-tender, distended MUSCULOSKELETAL: 1+ leg edema. SKIN: Warm and dry NEUROLOGIC:  Alert and oriented x 3 PSYCHIATRIC:  Normal affect     Labs    High Sensitivity Troponin:   Recent Labs  Lab 11/16/21 1552 11/16/21 1713 11/16/21 2055 11/25/21 1632 11/25/21 1957  TROPONINIHS 87* 92* 109* 19* 20*     Chemistry Recent Labs  Lab 11/27/21 0733 11/28/21 0501 11/29/21 0458 11/30/21 0358 12/01/21 0532  NA 131*   < > 134* 135 134*  K 3.6   < > 3.8 3.9 3.9  CL 85*   < > 88* 93* 94*  CO2 37*   < > 37* 34* 32  GLUCOSE 321*   < > 277* 222* 255*  BUN 36*   < > 55* 59* 66*  CREATININE 1.07*   < > 1.20* 1.34* 1.29*  CALCIUM 9.0   < > 8.2* 8.8* 8.7*  MG 2.0  --   --   --   --   PROT  --   --   --  6.7  --   ALBUMIN  --   --   --  3.3*  --   AST  --   --   --  17  --   ALT  --   --   --  16  --   ALKPHOS  --   --   --  54  --   BILITOT  --   --   --  0.5  --   GFRNONAA >60   < > 57* 50* 52*  ANIONGAP 9   < > 9 8 8    < > = values in this interval not displayed.    Lipids No results for input(s): "CHOL", "TRIG", "HDL", "LABVLDL", "LDLCALC", "CHOLHDL" in the last 168 hours.  Hematology Recent Labs  Lab 11/28/21 0501 11/30/21 0358 12/01/21 0532  WBC 12.7* 12.5* 11.6*  RBC 4.94 4.77 4.79  HGB 12.6 12.1 12.3  HCT 40.1 39.5 39.4  MCV 81.2 82.8 82.3  MCH 25.5* 25.4* 25.7*  MCHC 31.4 30.6 31.2  RDW 13.5 13.5 13.7  PLT 308 307 318   Thyroid No results for input(s): "TSH", "FREET4" in the last 168 hours.  BNP Recent Labs  Lab 11/25/21 1631 12/01/21 0532  BNP 192.6* 26.8    DDimer No results for input(s): "DDIMER" in the last 168 hours.   Radiology    No results found.  Cardiac Studies   Echo 11/19/2021 EF 60 to 65%   Patient Profile     46 y.o. female with history of hypertension, HFpEF, morbid obesity, possible sleep apnea presenting with worsening shortness of  breath and edema.   Assessment & Plan    1.  HFpEF -Still volume overloaded -Net -1.2 over the past 24 hours, creatinine slightly improved -Continue IV Lasix 40 mg twice daily, metolazone 5 mg daily -Strict ins and outs -Daily weights -Monitor creatinine with diuresis  -Will likely need higher dose of torsemide and metolazone on discharge.   2.  Hypertension -Aldactone 12.5mg  qd -BP controlled   3.  Snoring, possible sleep apnea -Will need outpatient OSA eval  Total encounter time more than 50 minutes  Greater than 50% was spent in counseling and coordination of care with the patient     Signed, 49, MD  12/01/2021, 11:07 AM

## 2021-12-01 NOTE — Progress Notes (Signed)
Patient is noncompliant with fluid restriction. While Dr. Mayford Knife was in the room with me, I reported that patient is not following her fluid restriction. Patient got mad and said why are we controlling her fluid intake. She denies drinking the 2 cups of water provided and stated the water she currently has was from last night. She became verbally abusive, security called. Patient decided to leave AMA. Dr. Mayford Knife aware.

## 2021-12-08 NOTE — Progress Notes (Deleted)
   Patient ID: Caitlyn Fuller, female    DOB: 04-23-1975, 46 y.o.   MRN: [id number]  HPI  Ms Patnode is a 46 y/o female with a history of  Echo report from 11/19/21 showed an EF of 60-65% along with moderate LVH, mild LAE and mild MR.  Review of Systems    Physical Exam    Assessment & Plan:  1: Chronic heart failure with preserved ejection fraction with structural changes (LVH)- - NYHA class - on GDMT of

## 2021-12-09 ENCOUNTER — Ambulatory Visit: Payer: Medicaid Other | Admitting: Family

## 2021-12-15 NOTE — Progress Notes (Deleted)
   Patient ID: Caitlyn Fuller, female    DOB: [birth year], 46 y.o.   MRN: [id number]  HPI  Ms Chenard is a 46 y/o female with a history of  Echo report from 11/19/21 showed an EF of 60-65% along with moderate LVH, mild LAE and mild MR.  Admitted 12/03/21 due to bilateral lower extremity edema and concerns for anasarca. Appropriately diuresed with IV lasix with improvement in clinical and symptomatic swelling. Discharged after 9 days. Admitted 11/25/21 due to worsening lower extremity edema, abdominal distention and weight gain. Initially placed on lasix drip but due to rising creatinine was switched to oral diuretics. Cardiology consult obtained. Noncompliant with fluid restriction. Left AMA after 6 days. Admitted 11/16/21 due to shortness of breath and leg edema and elevated sugars. Patient was started on IV Lasix.  Patient does not want any other medications.  She states that she was unable to tolerate the beta-blocker and ARB. Discharged after 6 days   She presents today for her initial visit with a chief complaint of   Review of Systems    Physical Exam    Assessment & Plan:  1: Chronic heart failure with preserved ejection fraction with structural changes (LVH)- - NYHA class - on GDMT of  2: HTN- - BP - BMP 12/12/21 showed sodium 139, potassium 4.6, creatinine 0.88 & GFR 82  3: DM- - A1c 11/17/21 was 12.5%

## 2021-12-16 ENCOUNTER — Ambulatory Visit: Payer: Medicaid Other | Admitting: Family

## 2021-12-16 ENCOUNTER — Telehealth: Payer: Self-pay | Admitting: Family

## 2021-12-16 NOTE — Telephone Encounter (Signed)
Patient did not show for her initial [facility] appointment on 12/16/21. Will attempt to reschedule.

## 2022-01-10 ENCOUNTER — Telehealth: Payer: Self-pay | Admitting: Family

## 2022-01-10 ENCOUNTER — Encounter: Payer: Medicaid Other | Admitting: Family

## 2022-01-10 NOTE — Telephone Encounter (Signed)
Patient did not show for her initial [facility] appointment on 01/10/22. Will attempt to reschedule.

## 2022-02-06 NOTE — Progress Notes (Deleted)
   Patient ID: Caitlyn Fuller, female    DOB: [birth year], 47 y.o.   MRN: [id number]  HPI  Caitlyn Fuller is a 47 y/o female with a history of  Echo 11/19/21 showed an EF of 60-65% along with moderate LVH, mild LAE and mild MR.   Admitted 12/03/21 due to a/c heart failure. Left AMA. Admitted 11/25/21 due to a/c heart failure. Lasix drip, fluid restriction. Left AMA. Admitted 11/16/21 due to a/c heart failure. Diuresed.   She presents today for her initial visit with a chief complaint of   Review of Systems    Physical Exam     Assessment & Plan:  1: Chronic heart failure with preserved ejection fraction with LVH/ LAE- - NYHA class - GDMT   2: HTN- - BP - saw PCP - BMP 12/12/21 showed sodium 139, potassium 4.6, creatinine 0.88 & GFR 82  3: DM- - A1c 11/17/21 was 12.5%  4:

## 2022-02-07 ENCOUNTER — Encounter: Payer: Medicaid Other | Admitting: Family

## 2022-02-07 ENCOUNTER — Other Ambulatory Visit (HOSPITAL_COMMUNITY): Payer: Self-pay

## 2022-02-07 ENCOUNTER — Telehealth: Payer: Self-pay | Admitting: Family

## 2022-02-07 NOTE — Telephone Encounter (Signed)
Patient did not show for her initial [facility] Failure Clinic appointment on 02/07/22. Will attempt to reschedule.

## 2022-02-09 ENCOUNTER — Inpatient Hospital Stay
Admission: EM | Admit: 2022-02-09 | Discharge: 2022-02-11 | DRG: 291 | Disposition: A | Discharge status: Home / Self Care | Payer: Medicaid Other | Attending: Internal Medicine | Admitting: Internal Medicine

## 2022-02-09 ENCOUNTER — Emergency Department: Payer: Medicaid Other

## 2022-02-09 ENCOUNTER — Other Ambulatory Visit: Payer: Self-pay

## 2022-02-09 DIAGNOSIS — F17210 Nicotine dependence, cigarettes, uncomplicated: Secondary | ICD-10-CM | POA: Diagnosis present

## 2022-02-09 DIAGNOSIS — G894 Chronic pain syndrome: Secondary | ICD-10-CM | POA: Diagnosis present

## 2022-02-09 DIAGNOSIS — E66813 Obesity, class 3: Secondary | ICD-10-CM | POA: Diagnosis present

## 2022-02-09 DIAGNOSIS — Z8673 Personal history of transient ischemic attack (TIA), and cerebral infarction without residual deficits: Secondary | ICD-10-CM | POA: Diagnosis not present

## 2022-02-09 DIAGNOSIS — I5033 Acute on chronic diastolic (congestive) heart failure: Secondary | ICD-10-CM | POA: Diagnosis not present

## 2022-02-09 DIAGNOSIS — I509 Heart failure, unspecified: Secondary | ICD-10-CM

## 2022-02-09 DIAGNOSIS — J45909 Unspecified asthma, uncomplicated: Secondary | ICD-10-CM | POA: Diagnosis present

## 2022-02-09 DIAGNOSIS — Z6841 Body Mass Index (BMI) 40.0 and over, adult: Secondary | ICD-10-CM | POA: Diagnosis not present

## 2022-02-09 DIAGNOSIS — Z809 Family history of malignant neoplasm, unspecified: Secondary | ICD-10-CM | POA: Diagnosis not present

## 2022-02-09 DIAGNOSIS — K219 Gastro-esophageal reflux disease without esophagitis: Secondary | ICD-10-CM | POA: Diagnosis present

## 2022-02-09 DIAGNOSIS — R739 Hyperglycemia, unspecified: Secondary | ICD-10-CM | POA: Diagnosis present

## 2022-02-09 DIAGNOSIS — I110 Hypertensive heart disease with heart failure: Principal | ICD-10-CM | POA: Diagnosis present

## 2022-02-09 DIAGNOSIS — Z9049 Acquired absence of other specified parts of digestive tract: Secondary | ICD-10-CM | POA: Diagnosis not present

## 2022-02-09 DIAGNOSIS — F1910 Other psychoactive substance abuse, uncomplicated: Secondary | ICD-10-CM | POA: Diagnosis present

## 2022-02-09 DIAGNOSIS — Z91199 Patient's noncompliance with other medical treatment and regimen due to unspecified reason: Secondary | ICD-10-CM | POA: Diagnosis not present

## 2022-02-09 DIAGNOSIS — I160 Hypertensive urgency: Secondary | ICD-10-CM | POA: Diagnosis present

## 2022-02-09 DIAGNOSIS — Z720 Tobacco use: Secondary | ICD-10-CM | POA: Diagnosis present

## 2022-02-09 DIAGNOSIS — Z7982 Long term (current) use of aspirin: Secondary | ICD-10-CM | POA: Diagnosis not present

## 2022-02-09 DIAGNOSIS — Z885 Allergy status to narcotic agent status: Secondary | ICD-10-CM

## 2022-02-09 DIAGNOSIS — Z91148 Patient's other noncompliance with medication regimen for other reason: Secondary | ICD-10-CM

## 2022-02-09 DIAGNOSIS — I5030 Unspecified diastolic (congestive) heart failure: Secondary | ICD-10-CM | POA: Diagnosis present

## 2022-02-09 DIAGNOSIS — I10 Essential (primary) hypertension: Secondary | ICD-10-CM

## 2022-02-09 DIAGNOSIS — I161 Hypertensive emergency: Secondary | ICD-10-CM | POA: Diagnosis present

## 2022-02-09 DIAGNOSIS — Z8249 Family history of ischemic heart disease and other diseases of the circulatory system: Secondary | ICD-10-CM

## 2022-02-09 DIAGNOSIS — E1165 Type 2 diabetes mellitus with hyperglycemia: Secondary | ICD-10-CM | POA: Diagnosis present

## 2022-02-09 DIAGNOSIS — E785 Hyperlipidemia, unspecified: Secondary | ICD-10-CM | POA: Diagnosis present

## 2022-02-09 DIAGNOSIS — Z888 Allergy status to other drugs, medicaments and biological substances status: Secondary | ICD-10-CM | POA: Diagnosis not present

## 2022-02-09 DIAGNOSIS — Z79899 Other long term (current) drug therapy: Secondary | ICD-10-CM | POA: Diagnosis not present

## 2022-02-09 DIAGNOSIS — G40909 Epilepsy, unspecified, not intractable, without status epilepticus: Secondary | ICD-10-CM | POA: Diagnosis present

## 2022-02-09 DIAGNOSIS — Z794 Long term (current) use of insulin: Secondary | ICD-10-CM

## 2022-02-09 LAB — BASIC METABOLIC PANEL
Anion gap: 10 (ref 5–15)
BUN: 21 mg/dL — ABNORMAL HIGH (ref 6–20)
CO2: 28 mmol/L (ref 22–32)
Calcium: 8.3 mg/dL — ABNORMAL LOW (ref 8.9–10.3)
Chloride: 90 mmol/L — ABNORMAL LOW (ref 98–111)
Creatinine, Ser: 0.71 mg/dL (ref 0.44–1.00)
GFR, Estimated: >60 mL/min (ref >60)
Glucose, Bld: 499 mg/dL — ABNORMAL HIGH (ref 70–99)
Potassium: 3.9 mmol/L (ref 3.5–5.1)
Sodium: 128 mmol/L — ABNORMAL LOW (ref 135–145)

## 2022-02-09 LAB — CBC
HCT: 42.7 % (ref 36.0–46.0)
Hemoglobin: 13.1 g/dL (ref 12.0–15.0)
MCH: 24.6 pg — ABNORMAL LOW (ref 26.0–34.0)
MCHC: 30.7 g/dL (ref 30.0–36.0)
MCV: 80.1 fL (ref 80.0–100.0)
Platelets: 265 10*3/uL (ref 150–400)
RBC: 5.33 MIL/uL — ABNORMAL HIGH (ref 3.87–5.11)
RDW: 14.2 % (ref 11.5–15.5)
WBC: 10.8 10*3/uL — ABNORMAL HIGH (ref 4.0–10.5)
nRBC: 0 % (ref 0.0–0.2)

## 2022-02-09 LAB — TROPONIN I (HIGH SENSITIVITY)
Troponin I (High Sensitivity): 19 ng/L — ABNORMAL HIGH (ref <18)
Troponin I (High Sensitivity): 18 ng/L — ABNORMAL HIGH (ref <18)

## 2022-02-09 LAB — GLUCOSE, CAPILLARY: Glucose-Capillary: 491 mg/dL — ABNORMAL HIGH (ref 70–99)

## 2022-02-09 LAB — CBG MONITORING, ED
Glucose-Capillary: 496 mg/dL — ABNORMAL HIGH (ref 70–99)
Glucose-Capillary: 506 mg/dL (ref 70–99)

## 2022-02-09 MED ORDER — INSULIN ASPART 100 UNIT/ML IJ SOLN: 0.0000 [IU] | Freq: Every day | INTRAMUSCULAR | Status: DC

## 2022-02-09 MED ORDER — INSULIN ASPART 100 UNIT/ML IJ SOLN
15.0000 [IU] | Freq: Three times a day (TID) | INTRAMUSCULAR | Status: DC
  Administered 2022-02-09 – 2022-02-11 (×6): 15 [IU] via SUBCUTANEOUS
  Filled 2022-02-09 (×6): qty 1

## 2022-02-09 MED ORDER — POTASSIUM CHLORIDE CRYS ER 20 MEQ PO TBCR
40.0000 meq | EXTENDED_RELEASE_TABLET | Freq: Every day | ORAL | Status: DC
  Administered 2022-02-10 – 2022-02-11 (×2): 40 meq via ORAL
  Filled 2022-02-09 (×2): qty 2

## 2022-02-09 MED ORDER — FAMOTIDINE 20 MG PO TABS
20.0000 mg | ORAL_TABLET | Freq: Every day | ORAL | Status: DC
  Administered 2022-02-09 – 2022-02-11 (×3): 20 mg via ORAL
  Filled 2022-02-09 (×3): qty 1

## 2022-02-09 MED ORDER — INSULIN ASPART 100 UNIT/ML IJ SOLN
0.0000 [IU] | Freq: Three times a day (TID) | INTRAMUSCULAR | Status: DC
  Administered 2022-02-10: 11 [IU] via SUBCUTANEOUS
  Administered 2022-02-10: 15 [IU] via SUBCUTANEOUS
  Administered 2022-02-10: 20 [IU] via SUBCUTANEOUS
  Administered 2022-02-11: 15 [IU] via SUBCUTANEOUS
  Administered 2022-02-11: 7 [IU] via SUBCUTANEOUS
  Administered 2022-02-11 (×2): 20 [IU] via SUBCUTANEOUS
  Filled 2022-02-09 (×7): qty 1

## 2022-02-09 MED ORDER — HYDRALAZINE HCL 20 MG/ML IJ SOLN: 10.0000 mg | Freq: Four times a day (QID) | INTRAMUSCULAR | Status: DC | PRN

## 2022-02-09 MED ORDER — INSULIN ASPART 100 UNIT/ML IJ SOLN
25.0000 [IU] | Freq: Once | INTRAMUSCULAR | Status: AC
  Administered 2022-02-09: 25 [IU] via SUBCUTANEOUS
  Filled 2022-02-09: qty 1

## 2022-02-09 MED ORDER — LORAZEPAM 2 MG/ML IJ SOLN: 1.0000 mg | Freq: Once | INTRAMUSCULAR | Status: DC

## 2022-02-09 MED ORDER — ENOXAPARIN SODIUM 60 MG/0.6ML IJ SOSY: 55.0000 mg | PREFILLED_SYRINGE | INTRAMUSCULAR | Status: DC

## 2022-02-09 MED ORDER — INSULIN DETEMIR 100 UNIT/ML ~~LOC~~ SOLN
50.0000 [IU] | Freq: Every day | SUBCUTANEOUS | Status: DC
  Administered 2022-02-09 – 2022-02-11 (×3): 50 [IU] via SUBCUTANEOUS
  Filled 2022-02-09 (×3): qty 0.5

## 2022-02-09 MED ORDER — LABETALOL HCL 5 MG/ML IV SOLN
10.0000 mg | Freq: Once | INTRAVENOUS | Status: AC
  Administered 2022-02-09: 10 mg via INTRAVENOUS
  Filled 2022-02-09: qty 4

## 2022-02-09 MED ORDER — SODIUM CHLORIDE 0.9% FLUSH
3.0000 mL | Freq: Two times a day (BID) | INTRAVENOUS | Status: DC
  Administered 2022-02-09 – 2022-02-11 (×5): 3 mL via INTRAVENOUS

## 2022-02-09 MED ORDER — FUROSEMIDE 10 MG/ML IJ SOLN
40.0000 mg | Freq: Two times a day (BID) | INTRAMUSCULAR | Status: DC
  Administered 2022-02-09 – 2022-02-11 (×5): 40 mg via INTRAVENOUS
  Filled 2022-02-09 (×5): qty 4

## 2022-02-09 MED ORDER — OXYCODONE HCL 5 MG PO TABS
15.0000 mg | ORAL_TABLET | Freq: Four times a day (QID) | ORAL | Status: DC | PRN
  Administered 2022-02-10 – 2022-02-11 (×4): 15 mg via ORAL
  Filled 2022-02-09 (×4): qty 3

## 2022-02-09 MED ORDER — SODIUM CHLORIDE 0.9 % IV SOLN: 250.0000 mL | INTRAVENOUS | Status: DC | PRN

## 2022-02-09 MED ORDER — LISINOPRIL 5 MG PO TABS: 5.0000 mg | ORAL_TABLET | Freq: Every day | ORAL | Status: DC

## 2022-02-09 MED ORDER — FUROSEMIDE 10 MG/ML IJ SOLN
80.0000 mg | Freq: Once | INTRAMUSCULAR | Status: AC
  Administered 2022-02-09: 80 mg via INTRAVENOUS
  Filled 2022-02-09: qty 8

## 2022-02-09 MED ORDER — LISINOPRIL 20 MG PO TABS
20.0000 mg | ORAL_TABLET | Freq: Every day | ORAL | Status: DC
  Administered 2022-02-10: 20 mg via ORAL
  Filled 2022-02-09: qty 1

## 2022-02-09 MED ORDER — ENOXAPARIN SODIUM 40 MG/0.4ML IJ SOSY: 40.0000 mg | PREFILLED_SYRINGE | INTRAMUSCULAR | Status: DC

## 2022-02-09 MED ORDER — INSULIN ASPART 100 UNIT/ML IJ SOLN
0.0000 [IU] | Freq: Three times a day (TID) | INTRAMUSCULAR | Status: DC
  Administered 2022-02-09: 20 [IU] via SUBCUTANEOUS
  Filled 2022-02-09: qty 1

## 2022-02-09 MED ORDER — ONDANSETRON HCL 4 MG/2ML IJ SOLN
4.0000 mg | Freq: Four times a day (QID) | INTRAMUSCULAR | Status: DC | PRN
  Administered 2022-02-10 – 2022-02-11 (×3): 4 mg via INTRAVENOUS
  Filled 2022-02-09 (×3): qty 2

## 2022-02-09 MED ORDER — SODIUM CHLORIDE 0.9% FLUSH: 3.0000 mL | INTRAVENOUS | Status: DC | PRN

## 2022-02-09 MED ORDER — ASPIRIN 81 MG PO TBEC
81.0000 mg | DELAYED_RELEASE_TABLET | Freq: Every day | ORAL | Status: DC
  Administered 2022-02-09 – 2022-02-11 (×3): 81 mg via ORAL
  Filled 2022-02-09 (×3): qty 1

## 2022-02-09 MED ORDER — INSULIN ASPART 100 UNIT/ML IJ SOLN
8.0000 [IU] | Freq: Once | INTRAMUSCULAR | Status: AC
  Administered 2022-02-09: 8 [IU] via INTRAVENOUS
  Filled 2022-02-09: qty 1

## 2022-02-09 MED ORDER — BUPRENORPHINE HCL-NALOXONE HCL 8-2 MG SL SUBL
1.0000 | SUBLINGUAL_TABLET | Freq: Every day | SUBLINGUAL | Status: DC
  Administered 2022-02-09 – 2022-02-10 (×2): 1 via SUBLINGUAL
  Filled 2022-02-09 (×2): qty 1

## 2022-02-09 MED ORDER — ATORVASTATIN CALCIUM 20 MG PO TABS
10.0000 mg | ORAL_TABLET | Freq: Every day | ORAL | Status: DC
  Administered 2022-02-09 – 2022-02-11 (×3): 10 mg via ORAL
  Filled 2022-02-09 (×3): qty 1

## 2022-02-09 MED ORDER — METOLAZONE 2.5 MG PO TABS
2.5000 mg | ORAL_TABLET | Freq: Every day | ORAL | Status: DC
  Administered 2022-02-10 – 2022-02-11 (×2): 2.5 mg via ORAL
  Filled 2022-02-09 (×3): qty 1

## 2022-02-09 NOTE — ED Notes (Signed)
CBG 496 in triage

## 2022-02-09 NOTE — Progress Notes (Incomplete)
Patient has prescription meds in her bra. This RN was notified at shift change.

## 2022-02-09 NOTE — ED Provider Notes (Signed)
[facility] Provider Note    Event Date/Time   First MD Initiated Contact with Patient 02/09/22 1500     (approximate)   History   Chief Complaint Shortness of Breath   HPI  Caitlyn Fuller is a 47 y.o. female with past medical history of hypertension, diabetes, stroke, seizure, CHF, chronic pain syndrome, and polysubstance abuse who presents to the ED complaining of shortness of breath.  Patient reports that she has been feeling increasingly short of breath with swelling in her legs and weight gain of about 20 pounds over the past 10 weeks.  This been associated with a dry cough, but she denies any fevers or pain in her chest.  She admits to missing doses of her Lasix recently, has also run out of her other medications.  She presented to her PCP for follow-up earlier today, was referred to the ED for evaluation and potential admission.     Physical Exam   Triage Vital Signs: ED Triage Vitals  Enc Vitals Group     BP 02/09/22 1406 (!) 206/118     Pulse Rate 02/09/22 1406 94     Resp 02/09/22 1406 (!) 24     Temp 02/09/22 1406 97.6 F (36.4 C)     Temp src --      SpO2 02/09/22 1406 100 %     Weight 02/09/22 1407 240 lb (108.9 kg)     Height 02/09/22 1407 5' (1.524 m)     Head Circumference --      Peak Flow --      Pain Score 02/09/22 1406 10     Pain Loc --      Pain Edu? --      Excl. in [place]? --     Most recent vital signs: Vitals:   02/09/22 1545 02/09/22 1600  BP: (!) 206/109 (!) 192/96  Pulse: 96 96  Resp: 12 11  Temp:    SpO2: 97% 95%    Constitutional: Alert and oriented. Eyes: Conjunctivae are normal. Head: Atraumatic. Nose: No congestion/rhinnorhea. Mouth/Throat: Mucous membranes are moist. Cardiovascular: Normal rate, regular rhythm. Grossly normal heart sounds.  2+ radial pulses bilaterally. Respiratory: Normal respiratory effort.  No retractions. Lung sounds distant with Rales to bilateral bases. Gastrointestinal:  Soft and nontender. No distention. Musculoskeletal: No lower extremity tenderness, 2+ pitting edema to knees bilaterally.  Neurologic:  Normal speech and language. No gross focal neurologic deficits are appreciated.    ED Results / Procedures / Treatments   Labs (all labs ordered are listed, but only abnormal results are displayed) Labs Reviewed  BASIC METABOLIC PANEL - Abnormal; Notable for the following components:      Result Value   Sodium 128 (*)    Chloride 90 (*)    Glucose, Bld 499 (*)    BUN 21 (*)    Calcium 8.3 (*)    All other components within normal limits  CBC - Abnormal; Notable for the following components:   WBC 10.8 (*)    RBC 5.33 (*)    MCH 24.6 (*)    All other components within normal limits  CBG MONITORING, ED - Abnormal; Notable for the following components:   Glucose-Capillary 496 (*)    All other components within normal limits  TROPONIN I (HIGH SENSITIVITY) - Abnormal; Notable for the following components:   Troponin I (High Sensitivity) 19 (*)    All other components within normal limits  POC URINE PREG, ED  TROPONIN  I (HIGH SENSITIVITY)     EKG  ED ECG REPORT I, Blake Divine, the attending physician, personally viewed and interpreted this ECG.   Date: 02/09/2022  EKG Time: 14:08  Rate: 90  Rhythm: normal sinus rhythm  Axis: Normal  Intervals:none  ST&T Change: None  RADIOLOGY Chest x-ray reviewed and interpreted by me with no infiltrate, edema, or effusion.  PROCEDURES:  Critical Care performed: No  Procedures   MEDICATIONS ORDERED IN ED: Medications  furosemide (LASIX) injection 80 mg (has no administration in time range)  insulin aspart (novoLOG) injection 8 Units (has no administration in time range)  labetalol (NORMODYNE) injection 10 mg (has no administration in time range)     IMPRESSION / MDM / ASSESSMENT AND PLAN / ED COURSE  I reviewed the triage vital signs and the nursing notes.                               47 y.o. female with past medical history of hypertension, diabetes, hyperlipidemia, CHF, stroke, seizure, polysubstance use, and chronic pain syndrome who presents to the ED for increasing difficulty breathing and leg swelling for the past week.  Patient's presentation is most consistent with acute presentation with potential threat to life or bodily function.  Differential diagnosis includes, but is not limited to, CHF exacerbation, COPD, PE, ACS, pneumonia, medication noncompliance, hypertensive emergency, hyperglycemia, DKA, electrolyte abnormality.  Patient nontoxic-appearing and in no acute distress, vital signs remarkable for significantly elevated BP at 205/113.  She is not in any respiratory distress and maintaining oxygen saturations at 96% on room air.  She does report significant weight gain and appears fluid overloaded with lower extremity edema and Rales to bilateral bases.  We will diurese with IV Lasix, does not appear she was prescribed any antihypertensive medication at the time of her last discharge as she signed out [place].  We will give IV labetalol for her hypertension for now, labs also remarkable for hyperglycemia with no evidence of DKA and we will give dose of IV insulin.  Sodium corrects to within normal limits when accounting for hyperglycemia, no significant anemia or leukocytosis noted.  Troponin mildly elevated but have low suspicion for ACS or PE at this time.  Patient would benefit from admission to the hospital service for further management of CHF exacerbation, hypertensive urgency, and hyperglycemia.  Case discussed with hospitalist for admission.      FINAL CLINICAL IMPRESSION(S) / ED DIAGNOSES   Final diagnoses:  Acute on chronic diastolic congestive heart failure ([place])  Uncontrolled hypertension  Hyperglycemia     Rx / DC Orders   ED Discharge Orders     None        Note:  This document was prepared using Dragon voice recognition software and may  include unintentional dictation errors.   Blake Divine, MD 02/09/22 [phone]

## 2022-02-09 NOTE — ED Notes (Signed)
Pt has ambulated to restroom in room multiple times since receiving lasix

## 2022-02-09 NOTE — Progress Notes (Signed)
CROSS COVER NOTE  NAME: Caitlyn Fuller MRN: [id number] DOB : June 12, 1975    HPI/Events of Note   Blood glucose 491  Assessment and  Interventions   Assessment:  Plan: novolog 25 units now Recheck cbg 4 hours Bedtime scale changed to high dose      Coby Antrobus Demetria Pore NP [facility]

## 2022-02-09 NOTE — H&P (Signed)
History and Physical  Caitlyn Fuller AOZ:[id number] DOB: 08/17/1975 DOA: 02/09/2022   PCP: [facility]   Patient coming from: Home, sent from PCP office  Chief Complaint: Shortness of breath, leg swelling  HPI: Caitlyn Fuller is a 47 y.o. female with medical history significant for chronic heart failure with preserved EF, type 2 diabetes insulin-dependent, hypertension, polysubstance abuse, chronic pain syndrome morbid obesity with BMI 47 being admitted to the hospital with hypertensive urgency, severe hyperglycemia without acidosis, and acute on chronic diastolic congestive heart failure.  She was recently admitted to this facility with similar complaints, left AMA on 12/01/2021.  She was then admitted to [facility] from 11/25-12//23, where she was treated for acute diastolic congestive heart failure and it was noted that the patient left the hospital prior to discharge orders and medication orders been signed.  Patient apparently went to her PCP today with complaints of not feeling well, found to be severely hypertensive and sent to the emergency department for evaluation.  She told the triage nurse that she " has missed several doses of her Lasix."  On my interview with the patient, she tells me that she has been feeling very cold, she found out today that her blood pressure is very high, and that she gained about 20 pounds in the last 10 days.  She has noticed some shortness of breath, but no cough, no chest pain.  When asked directly, the patient initially stated that she has been taking all of her medications as prescribed and has not missed any doses.  When confronted with what she told triage staff, she then admits to missing " a few days" of her Lasix.  However she insists that she otherwise is taking her medications, including her insulin.  However, she is unable to tell me what insulin she takes and how much.  She also states that she does not have a glucometer at  home, though she has been trying to get 1 for the last couple of months.  ED Course: Upon evaluation in the emergency department, she also notified the ED provider that she missed several doses of her Lasix recently, and has run out of her other medications.  On arrival to the emergency department, she was noted to have a blood pressure of 205/113, saturating 96% on room air.  She was given a dose of IV Lasix, IV labetalol for blood pressure control, as well as 8 units of IV insulin.  Hospitalist was contacted for admission.  Review of Systems: Please see HPI for pertinent positives and negatives. A complete 10 system review of systems are otherwise negative.  Past Medical History:  Diagnosis Date   Asthma    Chronic heart failure with preserved ejection fraction (HFpEF) ([place])    a. 04/2021 Echo: EF 65-70%, no rwma, GrI DD, mildly dil LA; b. 11/2021 Echo: EF 60-65%, no rwma, mod LVH, GrII DD, nl RV fxn, mildly dil LA, mild MR.   Diabetes mellitus without complication ([place])    Hernia    Hypertension    Kidney infection    Morbid obesity ([place])    Shingles    Past Surgical History:  Procedure Laterality Date   ABDOMINAL SURGERY     CESAREAN SECTION     CHOLECYSTECTOMY     colonscopy     TUBAL LIGATION     Social History:  reports that she has been smoking cigarettes. She has been smoking an average of .5 packs per day. She  has never used smokeless tobacco. She reports current drug use. Drug: "Crack" cocaine. She reports that she does not drink alcohol.   Allergies  Allergen Reactions   Amlodipine Swelling   Gabapentin Anaphylaxis   Morphine And Related Anaphylaxis   Darvocet [Propoxyphene N-Acetaminophen] Hives   Toradol [Ketorolac Tromethamine] Other (See Comments)    migraines   Ultram [Tramadol] Hives   Vancomycin Itching    Family History  Problem Relation Age of Onset   Cancer Mother    Heart failure Father      Prior to Admission medications   Medication Sig Start  Date End Date Taking? Authorizing Provider  aspirin EC 81 MG tablet Take 1 tablet (81 mg total) by mouth daily. Swallow whole. 11/22/21   Loletha Grayer, MD  atorvastatin (LIPITOR) 10 MG tablet Take 1 tablet (10 mg total) by mouth daily. 11/22/21   Loletha Grayer, MD  blood glucose meter kit and supplies KIT Dispense based on patient and insurance preference. Check sugar 4 times a day before meals and bedtime 11/22/21   Loletha Grayer, MD  famotidine (PEPCID) 20 MG tablet Take 1 tablet (20 mg total) by mouth daily. 11/22/21   Loletha Grayer, MD  furosemide (LASIX) 40 MG tablet Take 1 tablet (40 mg total) by mouth daily. 11/22/21   Loletha Grayer, MD  insulin aspart (NOVOLOG) 100 UNIT/ML injection 15 units subcutaneous injection prior to meals 11/22/21   Loletha Grayer, MD  insulin detemir (LEVEMIR) 100 UNIT/ML FlexPen Inject 50 Units into the skin at bedtime. 11/22/21   Loletha Grayer, MD  Insulin Pen Needle 33G X 5 MM MISC 1 Dose by Does not apply route 3 (three) times daily before meals. 11/22/21   Loletha Grayer, MD  oxyCODONE (ROXICODONE) 15 MG immediate release tablet Take 1 tablet (15 mg total) by mouth every 6 (six) hours as needed for moderate pain or severe pain. 11/22/21   Loletha Grayer, MD  potassium chloride SA (KLOR-CON M) 20 MEQ tablet Take 1 tablet (20 mEq total) by mouth daily. 11/22/21   Loletha Grayer, MD    Physical Exam: BP (!) 190/102   Pulse 98   Temp 97.6 F (36.4 C)   Resp 16   Ht 5' (1.524 m)   Wt 108.9 kg   LMP 01/16/2022   SpO2 99%   BMI 46.87 kg/m  General:  Alert, oriented, calm, in no acute distress; morbidly obese female sitting up on a stretcher in the ER, looks comfortable on room air speaking in full sentences with no cough Eyes: EOMI, clear conjuctivae, white sclerea Neck: supple, no masses, trachea mildline  Cardiovascular: RRR, no murmurs or rubs, she has pitting nontender bilateral lower extremity edema up to the  knees Respiratory: clear to auscultation bilaterally, no wheezes, no crackles  Abdomen: soft, nontender, nondistended, normal bowel tones heard  Skin: dry, no rashes  Musculoskeletal: no joint effusions, normal range of motion  Psychiatric: appropriate affect, normal speech  Neurologic: extraocular muscles intact, clear speech, moving all extremities with intact sensorium         Labs on Admission:  Basic Metabolic Panel: Recent Labs  Lab 02/09/22 1410  NA 128*  K 3.9  CL 90*  CO2 28  GLUCOSE 499*  BUN 21*  CREATININE 0.71  CALCIUM 8.3*   Liver Function Tests: No results for input(s): "AST", "ALT", "ALKPHOS", "BILITOT", "PROT", "ALBUMIN" in the last 168 hours. No results for input(s): "LIPASE", "AMYLASE" in the last 168 hours. No results for input(s): "AMMONIA" in the  last 168 hours. CBC: Recent Labs  Lab 02/09/22 1410  WBC 10.8*  HGB 13.1  HCT 42.7  MCV 80.1  PLT 265   Cardiac Enzymes: No results for input(s): "CKTOTAL", "CKMB", "CKMBINDEX", "TROPONINI" in the last 168 hours.  BNP (last 3 results) Recent Labs    11/16/21 1552 11/25/21 1631 12/01/21 0532  BNP 132.7* 192.6* 26.8    ProBNP (last 3 results) No results for input(s): "PROBNP" in the last 8760 hours.  CBG: Recent Labs  Lab 02/09/22 1413 02/09/22 1654  GLUCAP 496* 506*    Radiological Exams on Admission: DG Chest 2 View  Result Date: 02/09/2022 CLINICAL DATA:  Shortness of breath.  History of CHF. EXAM: CHEST - 2 VIEW COMPARISON:  CXR 11/25/21 FINDINGS: No pleural effusion. No pneumothorax. Normal cardiac and mediastinal contours. No focal airspace opacity. No radiographically apparent displaced rib fractures. Visualized upper abdomen is notable for a gas distended stomach. Surgical clips in the right upper quadrant. Vertebral body heights are maintained. IMPRESSION: No focal airspace opacity. Electronically Signed   By: Marin Roberts M.D.   On: 02/09/2022 14:41    Assessment/Plan Principal  Problem:   CHF exacerbation (HCC) Active Problems:   Hypertensive emergency   Uncontrolled type 2 diabetes mellitus with hyperglycemia, with long-term current use of insulin (HCC)   Polysubstance abuse (HCC)   Tobacco abuse   Seizure disorder (HCC)   Hyperlipidemia   Obesity, Class III, BMI 40-49.9 (morbid obesity) ([place])   Hyperglycemia without ketosis   GERD (gastroesophageal reflux disease)   History of CVA (cerebrovascular accident)   Heart failure with preserved ejection fraction ([place])   Noncompliance w/medication treatment due to intermit use of medication  This is a 47 year old female with a history of chronic diastolic congestive heart failure, polysubstance abuse, neck pain syndrome, insulin-dependent type 2 diabetes, hypertension, morbid obesity with BMI 47 being admitted to the hospital with hypertensive urgency, severe hyperglycemia without acidosis, and acute on chronic diastolic congestive heart failure.  Given the patient's inconsistent comments, I strongly suspect medication noncompliance is the primary cause of her readmission to the hospital. -Inpatient admission with telemetry -Given IV labetalol for hypertension in the ER; will continue home medications and add IV hydralazine as needed SBP over 180 -Cardiac diabetic diet with fluid restriction -Resume patient's home insulin regimen, with high-dose sliding scale -Start lisinopril 20 mg p.o. daily -Will initiate diuresis with Lasix 40 mg IV twice daily with metolazone daily -Initial troponin 19, will trend -Patient had recent echo 11/2021 with preserved EF and grade 2 diastolic dysfunction -Patient with morbid obesity BMI 16.10 complicating all aspects of care -Daily potassium supplementation, will monitor daily labs and electrolytes while on diuretic  DVT prophylaxis: Lovenox  Code Status: Full code  Family Communication: None present  Disposition Plan: Inpatient admission to telemetry.  Likely home at discharge,  she is a high AMA risk.  Consults called: None  Time spent: 65 minutes  Scotti Kosta Neva Seat MD [facility] Pager [phone]  If 7PM-7AM, please contact night-coverage [email or web address] Password [facility]  02/09/2022, 5:15 PM

## 2022-02-09 NOTE — ED Triage Notes (Signed)
Pt states coming into the hospital due to shortness of breath, feeling cold and having a high blood pressure. Pt states she has CHF, but has missed several doses of her lasix.

## 2022-02-09 NOTE — Progress Notes (Signed)
Security officer assisted with retrieving patient medication. Patient was refusing to give prescribed medication to this RN and Day Shift RN Eusebio Me. Buprenorphine-Naloxone count 51 bag serial number [id number]. Patient and nurse counted medication in front of Animal nutritionist. Serial label in patient chart. Medication sent to pharmacy.

## 2022-02-10 ENCOUNTER — Telehealth (HOSPITAL_COMMUNITY): Payer: Self-pay | Admitting: Pharmacy Technician

## 2022-02-10 ENCOUNTER — Other Ambulatory Visit (HOSPITAL_COMMUNITY): Payer: Self-pay

## 2022-02-10 DIAGNOSIS — I5033 Acute on chronic diastolic (congestive) heart failure: Secondary | ICD-10-CM | POA: Diagnosis not present

## 2022-02-10 LAB — BASIC METABOLIC PANEL
Anion gap: 9 (ref 5–15)
BUN: 28 mg/dL — ABNORMAL HIGH (ref 6–20)
CO2: 28 mmol/L (ref 22–32)
Calcium: 8.4 mg/dL — ABNORMAL LOW (ref 8.9–10.3)
Chloride: 94 mmol/L — ABNORMAL LOW (ref 98–111)
Creatinine, Ser: 0.99 mg/dL (ref 0.44–1.00)
GFR, Estimated: >60 mL/min (ref >60)
Glucose, Bld: 229 mg/dL — ABNORMAL HIGH (ref 70–99)
Potassium: 3 mmol/L — ABNORMAL LOW (ref 3.5–5.1)
Sodium: 131 mmol/L — ABNORMAL LOW (ref 135–145)

## 2022-02-10 LAB — GLUCOSE, CAPILLARY
Glucose-Capillary: 127 mg/dL — ABNORMAL HIGH (ref 70–99)
Glucose-Capillary: 148 mg/dL — ABNORMAL HIGH (ref 70–99)
Glucose-Capillary: 273 mg/dL — ABNORMAL HIGH (ref 70–99)
Glucose-Capillary: 333 mg/dL — ABNORMAL HIGH (ref 70–99)
Glucose-Capillary: 358 mg/dL — ABNORMAL HIGH (ref 70–99)
Glucose-Capillary: 408 mg/dL — ABNORMAL HIGH (ref 70–99)
Glucose-Capillary: 437 mg/dL — ABNORMAL HIGH (ref 70–99)

## 2022-02-10 LAB — CBC
HCT: 38.6 % (ref 36.0–46.0)
Hemoglobin: 12.1 g/dL (ref 12.0–15.0)
MCH: 24.6 pg — ABNORMAL LOW (ref 26.0–34.0)
MCHC: 31.3 g/dL (ref 30.0–36.0)
MCV: 78.6 fL — ABNORMAL LOW (ref 80.0–100.0)
Platelets: 246 10*3/uL (ref 150–400)
RBC: 4.91 MIL/uL (ref 3.87–5.11)
RDW: 14.2 % (ref 11.5–15.5)
WBC: 12.7 10*3/uL — ABNORMAL HIGH (ref 4.0–10.5)
nRBC: 0 % (ref 0.0–0.2)

## 2022-02-10 MED ORDER — INSULIN ASPART 100 UNIT/ML IJ SOLN
20.0000 [IU] | Freq: Once | INTRAMUSCULAR | Status: AC
  Administered 2022-02-10: 20 [IU] via SUBCUTANEOUS
  Filled 2022-02-10: qty 1

## 2022-02-10 MED ORDER — SACUBITRIL-VALSARTAN 24-26 MG PO TABS: 1.0000 | ORAL_TABLET | Freq: Two times a day (BID) | ORAL | Status: DC

## 2022-02-10 MED ORDER — IRBESARTAN 150 MG PO TABS
75.0000 mg | ORAL_TABLET | Freq: Every day | ORAL | Status: DC
  Administered 2022-02-11: 75 mg via ORAL
  Filled 2022-02-10: qty 1

## 2022-02-10 MED ORDER — POTASSIUM CHLORIDE CRYS ER 20 MEQ PO TBCR
40.0000 meq | EXTENDED_RELEASE_TABLET | Freq: Once | ORAL | Status: AC
  Administered 2022-02-10: 40 meq via ORAL
  Filled 2022-02-10: qty 2

## 2022-02-10 MED ORDER — BUPRENORPHINE HCL-NALOXONE HCL 8-2 MG SL SUBL
1.0000 | SUBLINGUAL_TABLET | Freq: Three times a day (TID) | SUBLINGUAL | Status: DC
  Administered 2022-02-10 – 2022-02-11 (×5): 1 via SUBLINGUAL
  Filled 2022-02-10 (×5): qty 1

## 2022-02-10 MED ORDER — ENOXAPARIN SODIUM 60 MG/0.6ML IJ SOSY
0.5000 mg/kg | PREFILLED_SYRINGE | INTRAMUSCULAR | Status: DC
  Administered 2022-02-11: 55 mg via SUBCUTANEOUS
  Filled 2022-02-10 (×2): qty 0.6

## 2022-02-10 NOTE — Telephone Encounter (Signed)
Patient Advocate Encounter   Received notification that prior authorization for Entresto 24-26MG  tablets is required.   PA submitted on 02/10/2022 Key [id number] Status is pending       Lyndel Safe, [place] Patient Advocate Specialist [place] Patient Advocate Team Direct Number: [phone]  Fax: [phone]

## 2022-02-10 NOTE — TOC Initial Note (Signed)
Transition of Care [facility]) - Initial/Assessment Note    Patient Details  Name: Caitlyn Fuller MRN: [id number] Date of Birth: 1975-05-17  Transition of Care [facility]) CM/SW Contact:    Tiburcio Bash, LCSW Phone Number: 02/10/2022, 9:34 AM  Clinical Narrative:                  Patient with recent readmissions, per chart review readmission risk assessment as follows:  PCP is a [facility]. She does not see a specific provider there.   A friend transports her to appointments.   She uses the pharmacy a her PCP office or [facility].   No issues obtaining medications. No home health or DME use prior to admission.   Encouraged her to contact DSS if she wishes to explore CAP program.  Expected Discharge Plan: Home/Self Care Barriers to Discharge: Continued Medical Work up   Patient Goals and CMS Choice Patient states their goals for this hospitalization and ongoing recovery are:: to go home CMS Medicare.gov Compare Post Acute Care list provided to:: Patient Choice offered to / list presented to : Patient      Expected Discharge Plan and Services                                              Prior Living Arrangements/Services                       Activities of Daily Living      Permission Sought/Granted                  Emotional Assessment              Admission diagnosis:  Hyperglycemia [R73.9] CHF exacerbation ([place]) [I50.9] Acute on chronic diastolic congestive heart failure ([place]) [I50.33] Uncontrolled hypertension [I10] Patient Active Problem List   Diagnosis Date Noted   CHF exacerbation ([place]) 02/09/2022   Noncompliance w/medication treatment due to intermit use of medication 02/09/2022   Heart failure with preserved ejection fraction ([place]) 11/30/2021   Primary hypertension 11/30/2021   Snoring 11/30/2021   History of CVA (cerebrovascular accident) 11/29/2021   AKI (acute kidney injury) ([place]) 11/29/2021   Constipation  11/28/2021   Uncontrolled type 2 diabetes mellitus with hyperglycemia, with long-term current use of insulin ([place]) 11/26/2021   Anasarca 11/26/2021   Dizziness 11/21/2021   Pseudohyponatremia 11/19/2021   Chronic pain 11/17/2021   GERD (gastroesophageal reflux disease) 11/17/2021   Hyperosmolar hyperglycemic state (HHS) ([place]) 11/16/2021   Myocardial injury 11/16/2021   Acute on chronic diastolic CHF (congestive heart failure) ([place]) 11/16/2021   Tobacco abuse 11/16/2021   Anxiety 11/16/2021   Facial swelling 09/26/2021   Obesity, Class III, BMI 40-49.9 (morbid obesity) ([place]) 09/26/2021   Hyperlipidemia 09/26/2021   Seizure disorder ([place]) 09/26/2021   ICH (intracerebral hemorrhage) ([place]) 04/12/2021   Hyperglycemia without ketosis    Polysubstance abuse ([place])    Herpes zoster    Renal mass, left    Hypertensive emergency    Hyperglycemia 11/01/2014   PCP:  [facility] [place]:   [place], [place] - [place] [address] Phone: [phone] Fax: [place], [place] - [address] [place] [address] Phone: [phone] Fax: [place] -  [place], [place] - [place] [address] Phone: [phone] Fax: [phone]     Social Determinants of Health (SDOH) Social History: SDOH Screenings   Food Insecurity: No Food Insecurity (11/26/2021)  Housing: Low Risk  (11/26/2021)  Transportation Needs: No Transportation Needs (11/26/2021)  Utilities: Not At Risk (11/26/2021)  Tobacco Use: High Risk (02/09/2022)   SDOH Interventions:     Readmission Risk Interventions    11/28/2021   11:17 AM 11/21/2021   11:34 AM  Readmission Risk Prevention Plan  Transportation Screening Complete Complete  PCP or Specialist Appt within 3-5 Days Complete Complete  Social  Work Consult for [place] Planning/Counseling Complete Complete  Palliative Care Screening  Not Applicable  Medication Review Press photographer) Complete Complete

## 2022-02-10 NOTE — Discharge Instructions (Signed)

## 2022-02-10 NOTE — Progress Notes (Signed)
   Heart Failure Nurse Navigator Note  Attempted to meet with patient.  There were no family members present.  She states she has not been weighing herself as she does not have a scale.  I reminded her that I had given her a scale on a previous admission.  She goes on to state "oh it must still be in the boxes - we have just moved."  Also asked why she did not come to her heart failure clinic appointment this week- "she states that she was sick."  She then states that she is sick to her stomach and proceeds to retch into wastebasket at the bedside.  Told her I would return when she is feeling better.  Pricilla Riffle RN [facility]

## 2022-02-10 NOTE — TOC Benefit Eligibility Note (Signed)
Patient Teacher, English as a foreign language completed.    The patient is currently admitted and upon discharge could be taking Entresto 24-26 mg.  Requires Prior Authorization  The patient is insured through [employer]   Lyndel Safe, [place] Patient Advocate Specialist [place] Patient Advocate Team Direct Number: [phone]  Fax: [phone]

## 2022-02-10 NOTE — Progress Notes (Signed)
PROGRESS NOTE  Caitlyn Fuller GUR:[id number] DOB: 02-23-1975 DOA: 02/09/2022 PCP: Inc, [facility] Course/Subjective: Caitlyn Fuller is a 47 y.o. female with medical history significant for chronic heart failure with preserved EF, type 2 diabetes insulin-dependent, hypertension, polysubstance abuse, chronic pain syndrome morbid obesity with BMI 47 being admitted to the hospital with hypertensive urgency, severe hyperglycemia without acidosis, and acute on chronic diastolic congestive heart failure.   She was admitted to the hospitalist service, and diuresed. This AM she says she feels a little better but that her legs "are still bad." She says her blood sugar dropped and she felt really bad.  Assessment/Plan:  Principal Problem:   CHF exacerbation (HCC) Active Problems:   Hypertensive emergency   Uncontrolled type 2 diabetes mellitus with hyperglycemia, with long-term current use of insulin (HCC)   Polysubstance abuse (HCC)   Tobacco abuse   Seizure disorder (HCC)   Hyperlipidemia   Obesity, Class III, BMI 40-49.9 (morbid obesity) (HCC)   Hyperglycemia without ketosis   GERD (gastroesophageal reflux disease)   History of CVA (cerebrovascular accident)   Heart failure with preserved ejection fraction (Caitlyn Fuller)   Noncompliance w/medication treatment due to intermit use of medication   This is a 48 year old female with a history of chronic diastolic congestive heart failure, polysubstance abuse, neck pain syndrome, insulin-dependent type 2 diabetes, hypertension, morbid obesity with BMI 47 being admitted to the hospital with hypertensive urgency, severe hyperglycemia without acidosis, and acute on chronic diastolic congestive heart failure.  Given the patient's inconsistent comments, I strongly suspect medication noncompliance is the primary cause of her readmission to the hospital. -continue inpatient admission with telemetry -Given IV labetalol for  hypertension in the ER; will continue home medications and add IV hydralazine as needed SBP over 180; BP much improved -Cardiac diabetic diet with fluid restriction -Resume patient's home insulin regimen, with high-dose sliding scale -lisinopril 20 mg p.o. daily -Will cont diuresis with Lasix 40 mg IV twice daily with metolazone daily -Initial troponin 19, 18 flat no chest pain -Patient had recent echo 11/2021 with preserved EF and grade 2 diastolic dysfunction -Patient with morbid obesity BMI 28.31 complicating all aspects of care -Daily potassium supplementation, will monitor daily labs and electrolytes while on diuretic   DVT prophylaxis: Lovenox   Code Status: Full code   Family Communication: None present   Disposition Plan: Inpatient admission to telemetry.  Likely home at discharge, she is a high AMA risk.   Consults called: None  Antimicrobials: Anti-infectives (From admission, onward)    None       Objective: Vitals:   02/10/22 0450 02/10/22 0632 02/10/22 0802 02/10/22 0807  BP: (!) 140/80 (!) 146/74 (!) 148/63 (!) 160/133  Pulse: 93 87 90 97  Resp: 19 18 16 16   Temp: 98.4 F (36.9 C) 98.4 F (36.9 C) 98.2 F (36.8 C) 98.2 F (36.8 C)  TempSrc: Oral Oral    SpO2: 98% 100% 99% (!) 89%  Weight:      Height:       No intake or output data in the 24 hours ending 02/10/22 0936 Filed Weights   02/09/22 1407  Weight: 108.9 kg   Exam: General:  Alert, oriented, calm, in no acute distress now on 2L [place]: EOMI, clear sclerea Neck: supple, no masses, trachea mildline  Cardiovascular: RRR, no murmurs or rubs, she has improving BLE edema  Respiratory: clear to auscultation bilaterally, no wheezes, no crackles  Abdomen: soft, nontender, nondistended, normal  bowel tones heard  Skin: dry, no rashes  Musculoskeletal: no joint effusions, normal range of motion  Psychiatric: appropriate affect, normal speech  Neurologic: extraocular muscles intact, clear speech,  moving all extremities with intact sensorium   Data Reviewed: CBC: Recent Labs  Lab 02/09/22 1410 02/10/22 0536  WBC 10.8* 12.7*  HGB 13.1 12.1  HCT 42.7 38.6  MCV 80.1 78.6*  PLT 265 161   Basic Metabolic Panel: Recent Labs  Lab 02/09/22 1410 02/10/22 0536  NA 128* 131*  K 3.9 3.0*  CL 90* 94*  CO2 28 28  GLUCOSE 499* 229*  BUN 21* 28*  CREATININE 0.71 0.99  CALCIUM 8.3* 8.4*   GFR: Estimated Creatinine Clearance: 79.5 mL/min (by C-G formula based on SCr of 0.99 mg/dL). Liver Function Tests: No results for input(s): "AST", "ALT", "ALKPHOS", "BILITOT", "PROT", "ALBUMIN" in the last 168 hours. No results for input(s): "LIPASE", "AMYLASE" in the last 168 hours. No results for input(s): "AMMONIA" in the last 168 hours. Coagulation Profile: No results for input(s): "INR", "PROTIME" in the last 168 hours. Cardiac Enzymes: No results for input(s): "CKTOTAL", "CKMB", "CKMBINDEX", "TROPONINI" in the last 168 hours. BNP (last 3 results) No results for input(s): "PROBNP" in the last 8760 hours. HbA1C: No results for input(s): "HGBA1C" in the last 72 hours. CBG: Recent Labs  Lab 02/09/22 2038 02/10/22 0011 02/10/22 0225 02/10/22 0630 02/10/22 0828  GLUCAP 491* 437* 358* 148* 273*   Lipid Profile: No results for input(s): "CHOL", "HDL", "LDLCALC", "TRIG", "CHOLHDL", "LDLDIRECT" in the last 72 hours. Thyroid Function Tests: No results for input(s): "TSH", "T4TOTAL", "FREET4", "T3FREE", "THYROIDAB" in the last 72 hours. Anemia Panel: No results for input(s): "VITAMINB12", "FOLATE", "FERRITIN", "TIBC", "IRON", "RETICCTPCT" in the last 72 hours. Urine analysis:    Component Value Date/Time   COLORURINE YELLOW (A) 11/16/2021 1713   APPEARANCEUR CLEAR (A) 11/16/2021 1713   APPEARANCEUR Clear 08/01/2013 1655   LABSPEC 1.017 11/16/2021 1713   LABSPEC 1.026 08/01/2013 1655   PHURINE 5.0 11/16/2021 1713   GLUCOSEU >=500 (A) 11/16/2021 1713   GLUCOSEU >=500 08/01/2013  1655   HGBUR LARGE (A) 11/16/2021 1713   BILIRUBINUR NEGATIVE 11/16/2021 1713   BILIRUBINUR Negative 08/01/2013 1655   KETONESUR NEGATIVE 11/16/2021 1713   PROTEINUR 30 (A) 11/16/2021 1713   UROBILINOGEN 0.2 11/01/2014 2153   NITRITE NEGATIVE 11/16/2021 1713   LEUKOCYTESUR NEGATIVE 11/16/2021 1713   LEUKOCYTESUR Negative 08/01/2013 1655   Sepsis Labs: @LABRCNTIP (procalcitonin:4,lacticidven:4)  )No results found for this or any previous visit (from the past 240 hour(s)).   Studies: DG Chest 2 View  Result Date: 02/09/2022 CLINICAL DATA:  Shortness of breath.  History of CHF. EXAM: CHEST - 2 VIEW COMPARISON:  CXR 11/25/21 FINDINGS: No pleural effusion. No pneumothorax. Normal cardiac and mediastinal contours. No focal airspace opacity. No radiographically apparent displaced rib fractures. Visualized upper abdomen is notable for a gas distended stomach. Surgical clips in the right upper quadrant. Vertebral body heights are maintained. IMPRESSION: No focal airspace opacity. Electronically Signed   By: Marin Roberts M.D.   On: 02/09/2022 14:41    Scheduled Meds:  aspirin EC  81 mg Oral Daily   atorvastatin  10 mg Oral Daily   buprenorphine-naloxone  1 tablet Sublingual Daily   enoxaparin (LOVENOX) injection  0.5 mg/kg Subcutaneous Q24H   famotidine  20 mg Oral Daily   furosemide  40 mg Intravenous BID   insulin aspart  0-20 Units Subcutaneous TID AC & HS   insulin aspart  15  Units Subcutaneous TID WC   insulin detemir  50 Units Subcutaneous QHS   lisinopril  20 mg Oral Daily   metolazone  2.5 mg Oral Daily   potassium chloride SA  40 mEq Oral Daily   potassium chloride  40 mEq Oral Once   sodium chloride flush  3 mL Intravenous Q12H    Continuous Infusions:  sodium chloride       LOS: 1 day   Time spent: 31 minutes  Caitlyn Seubert Neva Seat, MD [facility] Pager [phone]  If 7PM-7AM, please contact night-coverage [email or web address] Password [facility] 02/10/2022, 9:36 AM

## 2022-02-10 NOTE — Telephone Encounter (Signed)
Patient Advocate Encounter  Received notification that the request for prior authorization for Entresto 24-26MG  tablets  has been denied due to EF not being below 40%.       Lyndel Safe, [place] Patient Advocate Specialist [place] Patient Advocate Team Direct Number: [phone]  Fax: [phone]

## 2022-02-11 DIAGNOSIS — I5033 Acute on chronic diastolic (congestive) heart failure: Secondary | ICD-10-CM | POA: Diagnosis not present

## 2022-02-11 LAB — CBC
HCT: 38.4 % (ref 36.0–46.0)
Hemoglobin: 11.8 g/dL — ABNORMAL LOW (ref 12.0–15.0)
MCH: 24.9 pg — ABNORMAL LOW (ref 26.0–34.0)
MCHC: 30.7 g/dL (ref 30.0–36.0)
MCV: 81.2 fL (ref 80.0–100.0)
Platelets: 271 K/uL (ref 150–400)
RBC: 4.73 MIL/uL (ref 3.87–5.11)
RDW: 14.5 % (ref 11.5–15.5)
WBC: 16.4 K/uL — ABNORMAL HIGH (ref 4.0–10.5)
nRBC: 0 % (ref 0.0–0.2)

## 2022-02-11 LAB — BASIC METABOLIC PANEL WITH GFR
Anion gap: 7 (ref 5–15)
BUN: 42 mg/dL — ABNORMAL HIGH (ref 6–20)
CO2: 34 mmol/L — ABNORMAL HIGH (ref 22–32)
Calcium: 8.3 mg/dL — ABNORMAL LOW (ref 8.9–10.3)
Chloride: 89 mmol/L — ABNORMAL LOW (ref 98–111)
Creatinine, Ser: 1.18 mg/dL — ABNORMAL HIGH (ref 0.44–1.00)
GFR, Estimated: 58 mL/min — ABNORMAL LOW (ref >60)
Glucose, Bld: 417 mg/dL — ABNORMAL HIGH (ref 70–99)
Potassium: 4.6 mmol/L (ref 3.5–5.1)
Sodium: 130 mmol/L — ABNORMAL LOW (ref 135–145)

## 2022-02-11 LAB — GLUCOSE, CAPILLARY
Glucose-Capillary: 390 mg/dL — ABNORMAL HIGH (ref 70–99)
Glucose-Capillary: 369 mg/dL — ABNORMAL HIGH (ref 70–99)
Glucose-Capillary: 205 mg/dL — ABNORMAL HIGH (ref 70–99)
Glucose-Capillary: 321 mg/dL — ABNORMAL HIGH (ref 70–99)

## 2022-02-11 MED ORDER — ACETAMINOPHEN 325 MG PO TABS: 650.0000 mg | ORAL_TABLET | Freq: Four times a day (QID) | ORAL | Status: DC | PRN

## 2022-02-11 MED ORDER — IRBESARTAN 75 MG PO TABS: 75.0000 mg | ORAL_TABLET | Freq: Every day | ORAL | 0 refills | Status: DC

## 2022-02-11 MED ORDER — POTASSIUM CHLORIDE CRYS ER 20 MEQ PO TBCR: 20.0000 meq | EXTENDED_RELEASE_TABLET | Freq: Every day | ORAL | 0 refills | Status: DC

## 2022-02-11 NOTE — Discharge Summary (Signed)
Discharge Summary  Caitlyn Fuller QMG:[id number] DOB: 1975-03-26  PCP: Inc, [place] date: 02/09/2022 Discharge date: 02/11/2022  Recommendations for Outpatient Follow-up:  Please follow up with your PCP with CBC and BMP in 1-2 weeks Follow-up with outpatient heart failure clinic appointment.  Discharge Diagnoses:  Active Hospital Problems   Diagnosis Date Noted   CHF exacerbation ([place]) 02/09/2022   Hypertensive emergency     Priority: 1.   Uncontrolled type 2 diabetes mellitus with hyperglycemia, with long-term current use of insulin ([place]) 11/26/2021    Priority: 3.   Tobacco abuse 11/16/2021    Priority: 5.   Polysubstance abuse ([place])     Priority: 5.   Seizure disorder ([place]) 09/26/2021    Priority: 6.   Hyperlipidemia 09/26/2021    Priority: 14.   Obesity, Class III, BMI 40-49.9 (morbid obesity) ([place]) 09/26/2021    Priority: 15.   Noncompliance w/medication treatment due to intermit use of medication 02/09/2022   Heart failure with preserved ejection fraction ([place]) 11/30/2021   History of CVA (cerebrovascular accident) 11/29/2021   GERD (gastroesophageal reflux disease) 11/17/2021   Hyperglycemia without ketosis     Resolved Hospital Problems  No resolved problems to display.   Discharge Condition: Stable, improved.  Diet recommendation: Diet Orders (From admission, onward)     Start     Ordered   02/09/22 1618  Diet heart healthy/carb modified Room service appropriate? Yes; Fluid consistency: Thin; Fluid restriction: 2000 mL Fluid  Diet effective now       Question Answer Comment  Diet-HS Snack? Nothing   Room service appropriate? Yes   Fluid consistency: Thin   Fluid restriction: 2000 mL Fluid      02/09/22 1617           HPI and Brief Hospital Course:  Caitlyn Fuller is a 47 y.o. female with medical history significant for chronic heart failure with preserved EF, type 2 diabetes insulin-dependent, hypertension,  polysubstance abuse, chronic pain syndrome morbid obesity with BMI 47 being admitted to the hospital with hypertensive urgency, severe hyperglycemia without acidosis, and acute on chronic diastolic congestive heart failure.  Patient has a history of medication noncompliance and leaving the hospital AMA.  She was recently admitted to this hospital with similar complaints, left AMA on 12/01/2021.  She was then admitted to [facility] from 11/25 to 12/12/21 and was also noted at that time to leave the hospital prior to being given prescriptions or follow-up instructions.  Upon presentation to the emergency department, she was noted to have a blood pressure of 205/113, as well as blood sugar over 400.  She was started on IV labetalol, IV diuretic, and given insulin.  She was admitted to the hospitalist service.  Notably, the patient gave very contradictory histories, interview with the patient, she insisted that she has been taking all of her medications as prescribed.  However, she later admits to missing "a few days "of her Lasix, and told triage nurse at the time of admission that she was out of all of her medications.  She has very poor insight into her disease process.  She tells me that her daughter typically gives her her medications each day, but at the same time she tells me that her daughters both work long hours and are unable to help her manage her medications or go with her to her doctors appointments.  Patient was treated on the hospitalist service, she was treated with aggressive IV Lasix diuresis with  metolazone and Lasix 40 mg IV twice daily.  Unfortunately ins and outs as well as daily weights were not recorded.  However, on exam today the patient looks more comfortable, her blood pressure has normalized and her lower extremity edema is significantly improved.  Overall, she does still have some mild lower extremity edema however she is comfortable on room air.  Due to slight bump in her  creatinine, we cannot safely continue aggressive IV diuresis.  Of note, when the patient was initially told that she may be discharged later today, she told me that she has been having panic attacks and had to "get the doctor to walk with her outside last night to calm her down," and that she vomited 8 times overnight.  RN this morning reports that no vomiting has been witnessed, and she ate her breakfast this morning.  Patient is now ready and stable for discharge from the hospital today.  She is a very high risk for readmission to the hospital, given her lack of insight into her disease process, lack of consistent follow-up, and medication and diet noncompliance.  I encouraged the patient to follow-up closely with her primary care providers, take her medications as prescribed, and to consider taking her daughter is with her to medical appointments so that they can help in ensuring that she does what she can to maintain her health.  Patient understands, and is agreeable to discharge home from the hospital today.  Discharge details, plan of care and follow up instructions were discussed with patient and any available family or care providers. Patient and family are in agreement with discharge from the hospital today and all questions were answered to their satisfaction.  Discharge Exam: BP 110/82 (BP Location: Left Arm)   Pulse 98   Temp 98.3 F (36.8 C)   Resp 16   Ht 5' (1.524 m)   Wt 108.9 kg   LMP 01/16/2022   SpO2 95%   BMI 46.87 kg/m  General:  Alert, oriented, calm, in no acute distress  Eyes: EOMI, clear sclerea Neck: supple, no masses, trachea mildline  Cardiovascular: RRR, no murmurs or rubs, no peripheral edema  Respiratory: clear to auscultation bilaterally, no wheezes, no crackles  Abdomen: soft, nontender, nondistended, normal bowel tones heard  Skin: dry, no rashes  Musculoskeletal: no joint effusions, normal range of motion  Psychiatric: appropriate affect, normal speech   Neurologic: extraocular muscles intact, clear speech, moving all extremities with intact sensorium   Discharge Instructions You were cared for by a hospitalist during your hospital stay. If you have any questions about your discharge medications or the care you received while you were in the hospital after you are discharged, you can call the unit and asked to speak with the hospitalist on call if the hospitalist that took care of you is not available. Once you are discharged, your primary care physician will handle any further medical issues. Please note that NO REFILLS for any discharge medications will be authorized once you are discharged, as it is imperative that you return to your primary care physician (or establish a relationship with a primary care physician if you do not have one) for your aftercare needs so that they can reassess your need for medications and monitor your lab values.   Allergies as of 02/11/2022       Reactions   Amlodipine Swelling   Gabapentin Anaphylaxis   Morphine And Related Anaphylaxis   Darvocet [propoxyphene N-acetaminophen] Hives   Toradol [ketorolac Tromethamine] Other (  See Comments)   migraines   Ultram [tramadol] Hives   Vancomycin Itching        Medication List     STOP taking these medications    famotidine 20 MG tablet Commonly known as: PEPCID   oxyCODONE 15 MG immediate release tablet Commonly known as: ROXICODONE       TAKE these medications    aspirin EC 81 MG tablet Take 1 tablet (81 mg total) by mouth daily. Swallow whole.   atorvastatin 10 MG tablet Commonly known as: LIPITOR Take 1 tablet (10 mg total) by mouth daily.   blood glucose meter kit and supplies Kit Dispense based on patient and insurance preference. Check sugar 4 times a day before meals and bedtime   buprenorphine-naloxone 8-2 mg Subl SL tablet Commonly known as: SUBOXONE Place 1 tablet under the tongue 3 (three) times daily.   furosemide 40 MG  tablet Commonly known as: LASIX Take 1 tablet (40 mg total) by mouth daily.   insulin aspart 100 UNIT/ML injection Commonly known as: novoLOG 15 units subcutaneous injection prior to meals   insulin detemir 100 UNIT/ML FlexPen Commonly known as: LEVEMIR Inject 50 Units into the skin at bedtime.   Insulin Pen Needle 33G X 5 MM Misc 1 Dose by Does not apply route 3 (three) times daily before meals.   irbesartan 75 MG tablet Commonly known as: AVAPRO Take 1 tablet (75 mg total) by mouth daily. Start taking on: February 12, 2022   potassium chloride SA 20 MEQ tablet Commonly known as: KLOR-CON M Take 1 tablet (20 mEq total) by mouth daily.       Allergies  Allergen Reactions   Amlodipine Swelling   Gabapentin Anaphylaxis   Morphine And Related Anaphylaxis   Darvocet [Propoxyphene N-Acetaminophen] Hives   Toradol [Ketorolac Tromethamine] Other (See Comments)    migraines   Ultram [Tramadol] Hives   Vancomycin Itching    Follow-up [place], [employer]. Schedule an appointment as soon as possible for a visit in 1 week(s).   Contact information: [place] [address] [phone]                 The results of significant diagnostics from this hospitalization (including imaging, microbiology, ancillary and laboratory) are listed below for reference.    Significant Diagnostic Studies: DG Chest 2 View  Result Date: 02/09/2022 CLINICAL DATA:  Shortness of breath.  History of CHF. EXAM: CHEST - 2 VIEW COMPARISON:  CXR 11/25/21 FINDINGS: No pleural effusion. No pneumothorax. Normal cardiac and mediastinal contours. No focal airspace opacity. No radiographically apparent displaced rib fractures. Visualized upper abdomen is notable for a gas distended stomach. Surgical clips in the right upper quadrant. Vertebral body heights are maintained. IMPRESSION: No focal airspace opacity. Electronically Signed   By: Marin Roberts M.D.   On:  02/09/2022 14:41    Microbiology: No results found for this or any previous visit (from the past 240 hour(s)).   Labs: Basic Metabolic Panel: Recent Labs  Lab 02/09/22 1410 02/10/22 0536 02/11/22 0737  NA 128* 131* 130*  K 3.9 3.0* 4.6  CL 90* 94* 89*  CO2 28 28 34*  GLUCOSE 499* 229* 417*  BUN 21* 28* 42*  CREATININE 0.71 0.99 1.18*  CALCIUM 8.3* 8.4* 8.3*   Liver Function Tests: No results for input(s): "AST", "ALT", "ALKPHOS", "BILITOT", "PROT", "ALBUMIN" in the last 168 hours. No results for input(s): "LIPASE", "AMYLASE" in the last 168  hours. No results for input(s): "AMMONIA" in the last 168 hours. CBC: Recent Labs  Lab 02/09/22 1410 02/10/22 0536 02/11/22 0737  WBC 10.8* 12.7* 16.4*  HGB 13.1 12.1 11.8*  HCT 42.7 38.6 38.4  MCV 80.1 78.6* 81.2  PLT 265 246 271   Cardiac Enzymes: No results for input(s): "CKTOTAL", "CKMB", "CKMBINDEX", "TROPONINI" in the last 168 hours. BNP: BNP (last 3 results) Recent Labs    11/16/21 1552 11/25/21 1631 12/01/21 0532  BNP 132.7* 192.6* 26.8    ProBNP (last 3 results) No results for input(s): "PROBNP" in the last 8760 hours.  CBG: Recent Labs  Lab 02/10/22 1231 02/10/22 1722 02/10/22 2104 02/11/22 0754 02/11/22 1149  GLUCAP 127* 408* 333* 390* 369*   Time spent: > 30 minutes were spent in preparing this discharge including medication reconciliation, counseling, and coordination of care.  Signed:  Nehan Flaum Sharlette Dense, MD  [facility] 02/11/2022, 11:55 AM

## 2022-02-11 NOTE — Plan of Care (Signed)
Patient remained stable overnight. Patient had complaints of pain and Nausea/vomiting, which both were managed via MAR. Patient remains off of telemetry. Ambulated in the halls independently. Continue to manage hyperglycemia and HTN.   Problem: Education: Goal: Ability to describe self-care measures that may prevent or decrease complications (Diabetes Survival Skills Education) will improve Outcome: Progressing Goal: Individualized Educational Video(s) Outcome: Progressing   Problem: Coping: Goal: Ability to adjust to condition or change in health will improve Outcome: Progressing   Problem: Fluid Volume: Goal: Ability to maintain a balanced intake and output will improve Outcome: Progressing   Problem: Health Behavior/Discharge Planning: Goal: Ability to identify and utilize available resources and services will improve Outcome: Progressing Goal: Ability to manage health-related needs will improve Outcome: Progressing   Problem: Metabolic: Goal: Ability to maintain appropriate glucose levels will improve Outcome: Progressing   Problem: Nutritional: Goal: Maintenance of adequate nutrition will improve Outcome: Progressing Goal: Progress toward achieving an optimal weight will improve Outcome: Progressing   Problem: Skin Integrity: Goal: Risk for impaired skin integrity will decrease Outcome: Progressing   Problem: Tissue Perfusion: Goal: Adequacy of tissue perfusion will improve Outcome: Progressing   Problem: Education: Goal: Knowledge of General Education information will improve Description: Including pain rating scale, medication(s)/side effects and non-pharmacologic comfort measures Outcome: Progressing   Problem: Health Behavior/Discharge Planning: Goal: Ability to manage health-related needs will improve Outcome: Progressing   Problem: Clinical Measurements: Goal: Ability to maintain clinical measurements within normal limits will improve Outcome:  Progressing Goal: Will remain free from infection Outcome: Progressing Goal: Diagnostic test results will improve Outcome: Progressing Goal: Respiratory complications will improve Outcome: Progressing Goal: Cardiovascular complication will be avoided Outcome: Progressing   Problem: Activity: Goal: Risk for activity intolerance will decrease Outcome: Progressing   Problem: Nutrition: Goal: Adequate nutrition will be maintained Outcome: Progressing   Problem: Coping: Goal: Level of anxiety will decrease Outcome: Progressing   Problem: Elimination: Goal: Will not experience complications related to bowel motility Outcome: Progressing Goal: Will not experience complications related to urinary retention Outcome: Progressing   Problem: Pain Managment: Goal: General experience of comfort will improve Outcome: Progressing   Problem: Safety: Goal: Ability to remain free from injury will improve Outcome: Progressing   Problem: Skin Integrity: Goal: Risk for impaired skin integrity will decrease Outcome: Progressing

## 2022-02-14 NOTE — Progress Notes (Deleted)
   Patient ID: Caitlyn Fuller, female    DOB: 1975-01-24, 47 y.o.   MRN: [id number]  HPI  Caitlyn Fuller is a 47 y/o female with a history of  Echo 11/19/21 showed an EF of 60-65% along with moderate LVH, mild LAE and mild MR.   Admitted 02/09/22 due to hypertensive urgency, severe hyperglycemia without acidosis, and acute on chronic diastolic congestive heart failure. Diuresed. Admitted 12/03/21 due to a/c heart failure. Left AMA. Admitted 11/25/21 due to a/c heart failure. Lasix drip, fluid restriction. Left AMA. Admitted 11/16/21 due to a/c heart failure. Diuresed.   She presents today for her initial visit with a chief complaint of   Review of Systems    Physical Exam     Assessment & Plan:  1: Chronic heart failure with preserved ejection fraction with LVH/ LAE- - NYHA class - GDMT  - BNP 12/01/21 was 26.8  2: HTN- - BP - saw PCP - BMP 02/11/22 showed sodium 130, potassium 4.6, creatinine 1.18 & GFR 58  3: DM- - A1c 11/17/21 was 12.5%  4:

## 2022-02-15 ENCOUNTER — Telehealth: Payer: Self-pay | Admitting: Family

## 2022-02-15 ENCOUNTER — Other Ambulatory Visit (HOSPITAL_COMMUNITY): Payer: Self-pay

## 2022-02-15 ENCOUNTER — Encounter: Payer: Medicaid Other | Admitting: Family

## 2022-02-15 NOTE — Telephone Encounter (Signed)
Patient did not show for her initial Heart Failure Clinic appointment on 02/15/22. Will attempt to reschedule.

## 2022-03-20 ENCOUNTER — Emergency Department: Payer: Medicaid Other

## 2022-03-20 ENCOUNTER — Encounter: Payer: Self-pay | Admitting: *Deleted

## 2022-03-20 ENCOUNTER — Other Ambulatory Visit: Payer: Self-pay

## 2022-03-20 ENCOUNTER — Emergency Department
Admission: EM | Admit: 2022-03-20 | Discharge: 2022-03-21 | Disposition: A | Discharge status: Home / Self Care | Payer: Medicaid Other | Attending: Emergency Medicine | Admitting: Emergency Medicine

## 2022-03-20 DIAGNOSIS — L02811 Cutaneous abscess of head [any part, except face]: Secondary | ICD-10-CM | POA: Diagnosis not present

## 2022-03-20 DIAGNOSIS — X58XXXA Exposure to other specified factors, initial encounter: Secondary | ICD-10-CM | POA: Diagnosis not present

## 2022-03-20 DIAGNOSIS — S99921A Unspecified injury of right foot, initial encounter: Secondary | ICD-10-CM | POA: Diagnosis present

## 2022-03-20 DIAGNOSIS — L0291 Cutaneous abscess, unspecified: Secondary | ICD-10-CM

## 2022-03-20 DIAGNOSIS — R0602 Shortness of breath: Secondary | ICD-10-CM | POA: Diagnosis not present

## 2022-03-20 DIAGNOSIS — S92424A Nondisplaced fracture of distal phalanx of right great toe, initial encounter for closed fracture: Secondary | ICD-10-CM | POA: Diagnosis not present

## 2022-03-20 DIAGNOSIS — I110 Hypertensive heart disease with heart failure: Secondary | ICD-10-CM | POA: Insufficient documentation

## 2022-03-20 DIAGNOSIS — I5030 Unspecified diastolic (congestive) heart failure: Secondary | ICD-10-CM | POA: Insufficient documentation

## 2022-03-20 DIAGNOSIS — E119 Type 2 diabetes mellitus without complications: Secondary | ICD-10-CM | POA: Insufficient documentation

## 2022-03-20 NOTE — ED Triage Notes (Signed)
Pt has pain in right great toe/foot.  Pt injured 1 week ago.  Redness and swelling noted.  Pt also has abscess behind left ear and right scalp area.  Pt alert.

## 2022-03-20 NOTE — ED Provider Notes (Addendum)
[facility] Provider Note  Patient Contact: 11:25 PM (approximate)   History   Foot Injury   HPI  Caitlyn Fuller is a 47 y.o. female  with a history of chronic heart failure with preserved ejection fraction, insulin-dependent type 2 diabetes, hypertension, polysubstance abuse, and chronic pain presents to the emergency department with multiple medical complaints.  Patient states that she was referred by primary care to the emergency department for admission for CHF.  Patient also states that she hurt her right great toe 1 week ago and has two spontaneously draining abscesses at the left postauricular ear and right occipital scalp.  Patient states that her blood pressure has not been controlled at home.  Patient states that she has been admitted often in the past with similar complaints.  Patient's last admission was February 24.  Patient reports subjective weight gain at home.  She has had some shortness of breath for "several months" but it has acutely worsening over the past three days. No cough or chest pain.      Physical Exam   Triage Vital Signs: ED Triage Vitals  Enc Vitals Group     BP 03/20/22 2010 (!) 189/120     Pulse Rate 03/20/22 2010 95     Resp 03/20/22 2010 18     Temp 03/20/22 2010 98 F (36.7 C)     Temp Source 03/20/22 2010 Oral     SpO2 03/20/22 2010 95 %     Weight 03/20/22 2011 240 lb (108.9 kg)     Height 03/20/22 2011 5' (1.524 m)     Head Circumference --      Peak Flow --      Pain Score 03/20/22 2011 10     Pain Loc --      Pain Edu? --      Excl. in GC? --     Most recent vital signs: Vitals:   03/20/22 2010  BP: (!) 189/120  Pulse: 95  Resp: 18  Temp: 98 F (36.7 C)  SpO2: 95%     General: Alert and in no acute distress. Eyes:  PERRL. EOMI. Head: No acute traumatic findings ENT:      Nose: No congestion/rhinnorhea.      Mouth/Throat: Mucous membranes are moist. Neck: No stridor. No cervical spine  tenderness to palpation. Cardiovascular:  Good peripheral perfusion Respiratory: Normal respiratory effort without tachypnea or retractions. Lungs CTAB. Good air entry to the bases with no decreased or absent breath sounds. Gastrointestinal: Bowel sounds 4 quadrants. Soft and nontender to palpation. No guarding or rigidity. No palpable masses. No distention. No CVA tenderness. Musculoskeletal: Full range of motion to all extremities.  Patient has tenderness of the right great toe. Neurologic:  No gross focal neurologic deficits are appreciated.  Skin:   No rash noted Other:   ED Results / Procedures / Treatments   Labs (all labs ordered are listed, but only abnormal results are displayed) Labs Reviewed  CBC WITH DIFFERENTIAL/PLATELET  COMPREHENSIVE METABOLIC PANEL  LIPASE, BLOOD  TROPONIN I (HIGH SENSITIVITY)     EKG  Normal sinus rhythm without ST segment elevation or other apparent arrhythmia.   RADIOLOGY  I personally viewed and evaluated these images as part of my medical decision making, as well as reviewing the written report by the radiologist.  ED Provider Interpretation: Comminuted distal phalanx fracture of the right great toe.   PROCEDURES:  Critical Care performed: No  Procedures   MEDICATIONS ORDERED  IN ED: Medications - No data to display   IMPRESSION / MDM / ASSESSMENT AND PLAN / ED COURSE  I reviewed the triage vital signs and the nursing notes.                              Assessment and plan:    Toe pain Hypertension Shortness of breath Spontaneously draining abscesses  47 year old female presents to the emergency department with multiple medical complaints.  Patient was hypertensive at triage but vital signs were otherwise reassuring.  Patient states that she is primarily presenting to the emergency department due to CHF as she has had worsening shortness of breath and lower extremity swelling.  She states that she has had the symptoms  for several months but that her shortness of breath acutely worsened over the past 3 days.  Patient is also concerned about uncontrolled hypertension at home and two spontaneously draining abscesses with surrounding cellulitis, one at the left postauricular ear and the other along the right occipital scalp.  Patient states that she has severe tenderness and pain in these areas.  Patient was given linezolid IV per pharmacy recommendations that she is allergic to vancomycin.  Patient  reports that she injured her right great toe 1 week ago.  X-ray of the right foot does indicate a comminuted distal phalanx fracture of the right first digit.  Patient was placed in a cam boot for comfort.  Patient's workup is in process at this time for CHF.  Patient report was given to attending, Dr. Augusto Gamble who will assume patient care.   FINAL CLINICAL IMPRESSION(S) / ED DIAGNOSES   Final diagnoses:  Closed nondisplaced fracture of distal phalanx of right great toe, initial encounter  Shortness of breath  Abscess     Rx / DC Orders   ED Discharge Orders     None        Note:  This document was prepared using Dragon voice recognition software and may include unintentional dictation errors.   Pia Mau Lovelady, PA-C 03/21/22 0030    Orvil Feil, PA-C 03/21/22 0045    Georga Hacking, MD 03/21/22 0207    Georga Hacking, MD 03/21/22 [phone]

## 2022-03-21 ENCOUNTER — Emergency Department: Payer: Medicaid Other

## 2022-03-21 LAB — CBC WITH DIFFERENTIAL/PLATELET
Abs Immature Granulocytes: 0.03 10*3/uL (ref 0.00–0.07)
Basophils Absolute: 0.1 10*3/uL (ref 0.0–0.1)
Basophils Relative: 1 %
Eosinophils Absolute: 0.1 10*3/uL (ref 0.0–0.5)
Eosinophils Relative: 1 %
HCT: 40 % (ref 36.0–46.0)
Hemoglobin: 12.4 g/dL (ref 12.0–15.0)
Immature Granulocytes: 0 %
Lymphocytes Relative: 21 %
Lymphs Abs: 2.5 10*3/uL (ref 0.7–4.0)
MCH: 24.8 pg — ABNORMAL LOW (ref 26.0–34.0)
MCHC: 31 g/dL (ref 30.0–36.0)
MCV: 80.2 fL (ref 80.0–100.0)
Monocytes Absolute: 0.7 10*3/uL (ref 0.1–1.0)
Monocytes Relative: 6 %
Neutro Abs: 8.3 10*3/uL — ABNORMAL HIGH (ref 1.7–7.7)
Neutrophils Relative %: 71 %
Platelets: 242 10*3/uL (ref 150–400)
RBC: 4.99 MIL/uL (ref 3.87–5.11)
RDW: 14 % (ref 11.5–15.5)
WBC: 11.7 10*3/uL — ABNORMAL HIGH (ref 4.0–10.5)
nRBC: 0 % (ref 0.0–0.2)

## 2022-03-21 LAB — COMPREHENSIVE METABOLIC PANEL
ALT: 13 U/L (ref 0–44)
AST: 14 U/L — ABNORMAL LOW (ref 15–41)
Albumin: 3 g/dL — ABNORMAL LOW (ref 3.5–5.0)
Alkaline Phosphatase: 83 U/L (ref 38–126)
Anion gap: 6 (ref 5–15)
BUN: 15 mg/dL (ref 6–20)
CO2: 30 mmol/L (ref 22–32)
Calcium: 7.9 mg/dL — ABNORMAL LOW (ref 8.9–10.3)
Chloride: 95 mmol/L — ABNORMAL LOW (ref 98–111)
Creatinine, Ser: 0.82 mg/dL (ref 0.44–1.00)
GFR, Estimated: >60 mL/min (ref >60)
Glucose, Bld: 483 mg/dL — ABNORMAL HIGH (ref 70–99)
Potassium: 3.9 mmol/L (ref 3.5–5.1)
Sodium: 131 mmol/L — ABNORMAL LOW (ref 135–145)
Total Bilirubin: 0.4 mg/dL (ref 0.3–1.2)
Total Protein: 6.4 g/dL — ABNORMAL LOW (ref 6.5–8.1)

## 2022-03-21 LAB — TROPONIN I (HIGH SENSITIVITY)
Troponin I (High Sensitivity): 23 ng/L — ABNORMAL HIGH (ref <18)
Troponin I (High Sensitivity): 26 ng/L — ABNORMAL HIGH (ref <18)

## 2022-03-21 LAB — CBG MONITORING, ED: Glucose-Capillary: 429 mg/dL — ABNORMAL HIGH (ref 70–99)

## 2022-03-21 LAB — LIPASE, BLOOD: Lipase: 57 U/L — ABNORMAL HIGH (ref 11–51)

## 2022-03-21 MED ORDER — OXYCODONE HCL 5 MG PO TABS
10.0000 mg | ORAL_TABLET | Freq: Once | ORAL | Status: AC
  Administered 2022-03-21: 10 mg via ORAL
  Filled 2022-03-21: qty 2

## 2022-03-21 MED ORDER — FAMOTIDINE 20 MG PO TABS
20.0000 mg | ORAL_TABLET | Freq: Once | ORAL | Status: AC
  Administered 2022-03-21: 20 mg via ORAL
  Filled 2022-03-21: qty 1

## 2022-03-21 MED ORDER — DOXYCYCLINE HYCLATE 100 MG PO TABS
100.0000 mg | ORAL_TABLET | Freq: Once | ORAL | Status: AC
  Administered 2022-03-21: 100 mg via ORAL
  Filled 2022-03-21: qty 1

## 2022-03-21 MED ORDER — DOXYCYCLINE MONOHYDRATE 100 MG PO TABS: 100.0000 mg | ORAL_TABLET | Freq: Two times a day (BID) | ORAL | 0 refills | Status: AC

## 2022-03-21 MED ORDER — FUROSEMIDE 10 MG/ML IJ SOLN
40.0000 mg | Freq: Once | INTRAMUSCULAR | Status: AC
  Administered 2022-03-21: 40 mg via INTRAVENOUS
  Filled 2022-03-21: qty 4

## 2022-03-21 MED ORDER — LINEZOLID 600 MG/300ML IV SOLN
600.0000 mg | Freq: Two times a day (BID) | INTRAVENOUS | Status: DC
  Filled 2022-03-21: qty 300

## 2022-03-21 MED ORDER — LIDOCAINE-EPINEPHRINE 2 %-1:100000 IJ SOLN
20.0000 mL | Freq: Once | INTRAMUSCULAR | Status: AC
  Administered 2022-03-21: 20 mL
  Filled 2022-03-21: qty 1

## 2022-03-21 MED ORDER — INSULIN ASPART 100 UNIT/ML IJ SOLN
10.0000 [IU] | Freq: Once | INTRAMUSCULAR | Status: AC
  Administered 2022-03-21: 10 [IU] via SUBCUTANEOUS
  Filled 2022-03-21: qty 1

## 2022-03-21 MED ORDER — ACETAMINOPHEN 500 MG PO TABS
1000.0000 mg | ORAL_TABLET | Freq: Once | ORAL | Status: AC
  Administered 2022-03-21: 1000 mg via ORAL
  Filled 2022-03-21: qty 2

## 2022-03-21 MED ORDER — IRBESARTAN 75 MG PO TABS
75.0000 mg | ORAL_TABLET | Freq: Once | ORAL | Status: AC
  Administered 2022-03-21: 75 mg via ORAL
  Filled 2022-03-21: qty 1

## 2022-03-21 MED ORDER — ALUM & MAG HYDROXIDE-SIMETH 200-200-20 MG/5ML PO SUSP
30.0000 mL | Freq: Once | ORAL | Status: AC
  Administered 2022-03-21: 30 mL via ORAL
  Filled 2022-03-21: qty 30

## 2022-03-21 NOTE — ED Notes (Signed)
Pt A&O x4, no obvious distress noted, respirations regular/unlabored. Pt verbalizes understanding of discharge instructions. Pt able to ambulate from ED independently.   

## 2022-03-21 NOTE — Discharge Instructions (Addendum)
Please take the antibiotic twice a day for the next 5 days.  Please use warm compresses on the area to help with healing.  If the abscesses becoming more painful or increasing in size despite being on antibiotics please return to the emergency department.  It is very important that you take your blood pressure medications insulin and diuretic.  Please follow-up with the heart failure clinic.

## 2022-04-06 ENCOUNTER — Telehealth: Payer: Self-pay

## 2022-04-06 NOTE — Telephone Encounter (Addendum)
Attempted to call pt, to schedule appt from referral made by Starleen Blue MD ([facility]). Pt did not answer and vm was full.  Will attempt to call back again.  Will route to [place] H,FNP for FYI.  Update 04/06/22 1136:  Appt made for 04/10/22 @1130  with Lysle Morales. Per pt request.

## 2022-04-10 ENCOUNTER — Telehealth: Payer: Self-pay | Admitting: Family

## 2022-04-10 ENCOUNTER — Encounter: Payer: Medicaid Other | Admitting: Family

## 2022-04-10 NOTE — Progress Notes (Deleted)
   Patient ID: Caitlyn Fuller, female    DOB: 03-17-1975, 47 y.o.   MRN: [id number]  HPI  Ms Ficarra is a 47 y/o female with a history of  Echo 11/19/21: EF 60-65% along with moderate LVH, mild LAE and mild MR.   Was in the ED 03/20/22 due to right great toe pain, left ear abscess and SOB. Noncompliant w/ diuretics and HTN meds d/t feeling dizzy. Admitted 02/09/22 due to a/c heart failure along with HTN. She was started on IV labetalol, IV diuretic, and given insulin. IV lasix and metolazone given.   She presents today for her initial visit with a chief complaint of   Review of Systems    Physical Exam    Assessment & Plan:  1: Chronic heart failure with preserved ejection fraction- - NYHA class  - Echo 11/19/21: EF 60-65% along with moderate LVH, mild LAE and mild MR.   - BNP 12/01/21 was 26.8  2: HTN- - BP  - BMP 03/21/22 showed sodium 131, potassium 3.9, creatinine 0.82 & GFR >60  3: IDDM- - A1c 11/17/21 was 12.5%  4:

## 2022-04-10 NOTE — Telephone Encounter (Signed)
Patient did not show for her initial Heart Failure Clinic appointment on 04/10/22. This is the 5th appointment she has missed.

## 2022-04-11 ENCOUNTER — Telehealth: Payer: Self-pay

## 2022-04-11 NOTE — Telephone Encounter (Signed)
Pt was rescheduled for Tuesday 4/9 @ 0900 with Darylene Price.  Pt aware, agreeable, and verbalized understanding.

## 2022-04-18 ENCOUNTER — Telehealth: Payer: Self-pay | Admitting: Family

## 2022-04-18 ENCOUNTER — Encounter: Payer: Medicaid Other | Admitting: Family

## 2022-04-18 NOTE — Telephone Encounter (Signed)
Patient did not show for her initial Heart Failure Clinic appointment on 04/18/22. This is the 6th appointment she has missed.

## 2022-05-01 ENCOUNTER — Encounter: Payer: Self-pay | Admitting: Emergency Medicine

## 2022-05-01 ENCOUNTER — Inpatient Hospital Stay
Admission: EM | Admit: 2022-05-01 | Discharge: 2022-05-20 | DRG: 286 | Disposition: A | Discharge status: Home / Self Care | Payer: Medicaid Other | Attending: Internal Medicine | Admitting: Internal Medicine

## 2022-05-01 ENCOUNTER — Emergency Department: Payer: Medicaid Other

## 2022-05-01 ENCOUNTER — Other Ambulatory Visit: Payer: Self-pay

## 2022-05-01 DIAGNOSIS — E8770 Fluid overload, unspecified: Secondary | ICD-10-CM

## 2022-05-01 DIAGNOSIS — N182 Chronic kidney disease, stage 2 (mild): Secondary | ICD-10-CM | POA: Diagnosis present

## 2022-05-01 DIAGNOSIS — I10 Essential (primary) hypertension: Secondary | ICD-10-CM | POA: Diagnosis not present

## 2022-05-01 DIAGNOSIS — I5033 Acute on chronic diastolic (congestive) heart failure: Secondary | ICD-10-CM | POA: Diagnosis not present

## 2022-05-01 DIAGNOSIS — R601 Generalized edema: Principal | ICD-10-CM

## 2022-05-01 DIAGNOSIS — D631 Anemia in chronic kidney disease: Secondary | ICD-10-CM | POA: Diagnosis present

## 2022-05-01 DIAGNOSIS — T502X5A Adverse effect of carbonic-anhydrase inhibitors, benzothiadiazides and other diuretics, initial encounter: Secondary | ICD-10-CM | POA: Diagnosis not present

## 2022-05-01 DIAGNOSIS — E1165 Type 2 diabetes mellitus with hyperglycemia: Secondary | ICD-10-CM | POA: Diagnosis not present

## 2022-05-01 DIAGNOSIS — Z91148 Patient's other noncompliance with medication regimen for other reason: Secondary | ICD-10-CM

## 2022-05-01 DIAGNOSIS — Z91199 Patient's noncompliance with other medical treatment and regimen due to unspecified reason: Secondary | ICD-10-CM

## 2022-05-01 DIAGNOSIS — E876 Hypokalemia: Secondary | ICD-10-CM | POA: Diagnosis present

## 2022-05-01 DIAGNOSIS — E1122 Type 2 diabetes mellitus with diabetic chronic kidney disease: Secondary | ICD-10-CM | POA: Diagnosis present

## 2022-05-01 DIAGNOSIS — I2510 Atherosclerotic heart disease of native coronary artery without angina pectoris: Secondary | ICD-10-CM | POA: Diagnosis present

## 2022-05-01 DIAGNOSIS — Z79899 Other long term (current) drug therapy: Secondary | ICD-10-CM

## 2022-05-01 DIAGNOSIS — I5023 Acute on chronic systolic (congestive) heart failure: Secondary | ICD-10-CM | POA: Diagnosis present

## 2022-05-01 DIAGNOSIS — I2729 Other secondary pulmonary hypertension: Secondary | ICD-10-CM | POA: Diagnosis present

## 2022-05-01 DIAGNOSIS — K5900 Constipation, unspecified: Secondary | ICD-10-CM | POA: Diagnosis not present

## 2022-05-01 DIAGNOSIS — G40909 Epilepsy, unspecified, not intractable, without status epilepticus: Secondary | ICD-10-CM | POA: Diagnosis present

## 2022-05-01 DIAGNOSIS — I160 Hypertensive urgency: Secondary | ICD-10-CM

## 2022-05-01 DIAGNOSIS — J029 Acute pharyngitis, unspecified: Secondary | ICD-10-CM | POA: Insufficient documentation

## 2022-05-01 DIAGNOSIS — J45909 Unspecified asthma, uncomplicated: Secondary | ICD-10-CM | POA: Diagnosis present

## 2022-05-01 DIAGNOSIS — E559 Vitamin D deficiency, unspecified: Secondary | ICD-10-CM | POA: Diagnosis present

## 2022-05-01 DIAGNOSIS — Z91119 Patient's noncompliance with dietary regimen due to unspecified reason: Secondary | ICD-10-CM

## 2022-05-01 DIAGNOSIS — G8929 Other chronic pain: Secondary | ICD-10-CM | POA: Diagnosis present

## 2022-05-01 DIAGNOSIS — Z7982 Long term (current) use of aspirin: Secondary | ICD-10-CM

## 2022-05-01 DIAGNOSIS — G894 Chronic pain syndrome: Secondary | ICD-10-CM | POA: Diagnosis present

## 2022-05-01 DIAGNOSIS — I5082 Biventricular heart failure: Secondary | ICD-10-CM | POA: Diagnosis present

## 2022-05-01 DIAGNOSIS — Z888 Allergy status to other drugs, medicaments and biological substances status: Secondary | ICD-10-CM

## 2022-05-01 DIAGNOSIS — E1151 Type 2 diabetes mellitus with diabetic peripheral angiopathy without gangrene: Secondary | ICD-10-CM | POA: Diagnosis present

## 2022-05-01 DIAGNOSIS — G4733 Obstructive sleep apnea (adult) (pediatric): Secondary | ICD-10-CM | POA: Diagnosis present

## 2022-05-01 DIAGNOSIS — Z885 Allergy status to narcotic agent status: Secondary | ICD-10-CM

## 2022-05-01 DIAGNOSIS — E11649 Type 2 diabetes mellitus with hypoglycemia without coma: Secondary | ICD-10-CM | POA: Diagnosis not present

## 2022-05-01 DIAGNOSIS — F1910 Other psychoactive substance abuse, uncomplicated: Secondary | ICD-10-CM | POA: Diagnosis present

## 2022-05-01 DIAGNOSIS — I272 Pulmonary hypertension, unspecified: Secondary | ICD-10-CM

## 2022-05-01 DIAGNOSIS — R197 Diarrhea, unspecified: Secondary | ICD-10-CM | POA: Diagnosis present

## 2022-05-01 DIAGNOSIS — Z6841 Body Mass Index (BMI) 40.0 and over, adult: Secondary | ICD-10-CM

## 2022-05-01 DIAGNOSIS — N179 Acute kidney failure, unspecified: Secondary | ICD-10-CM | POA: Diagnosis present

## 2022-05-01 DIAGNOSIS — Z8249 Family history of ischemic heart disease and other diseases of the circulatory system: Secondary | ICD-10-CM

## 2022-05-01 DIAGNOSIS — Z794 Long term (current) use of insulin: Secondary | ICD-10-CM

## 2022-05-01 DIAGNOSIS — E871 Hypo-osmolality and hyponatremia: Secondary | ICD-10-CM | POA: Diagnosis present

## 2022-05-01 DIAGNOSIS — Z87891 Personal history of nicotine dependence: Secondary | ICD-10-CM

## 2022-05-01 DIAGNOSIS — F410 Panic disorder [episodic paroxysmal anxiety] without agoraphobia: Secondary | ICD-10-CM | POA: Diagnosis not present

## 2022-05-01 DIAGNOSIS — E873 Alkalosis: Secondary | ICD-10-CM | POA: Diagnosis present

## 2022-05-01 DIAGNOSIS — Z881 Allergy status to other antibiotic agents status: Secondary | ICD-10-CM

## 2022-05-01 DIAGNOSIS — I130 Hypertensive heart and chronic kidney disease with heart failure and stage 1 through stage 4 chronic kidney disease, or unspecified chronic kidney disease: Principal | ICD-10-CM | POA: Diagnosis present

## 2022-05-01 DIAGNOSIS — Z809 Family history of malignant neoplasm, unspecified: Secondary | ICD-10-CM

## 2022-05-01 HISTORY — DX: Heart failure, unspecified: I50.9

## 2022-05-01 LAB — CBC
HCT: 43 % (ref 36.0–46.0)
Hemoglobin: 13 g/dL (ref 12.0–15.0)
MCH: 24.5 pg — ABNORMAL LOW (ref 26.0–34.0)
MCHC: 30.2 g/dL (ref 30.0–36.0)
MCV: 81.1 fL (ref 80.0–100.0)
Platelets: 225 10*3/uL (ref 150–400)
RBC: 5.3 MIL/uL — ABNORMAL HIGH (ref 3.87–5.11)
RDW: 14.4 % (ref 11.5–15.5)
WBC: 9.7 10*3/uL (ref 4.0–10.5)
nRBC: 0 % (ref 0.0–0.2)

## 2022-05-01 LAB — URINALYSIS, ROUTINE W REFLEX MICROSCOPIC
Bacteria, UA: NONE SEEN
Bilirubin Urine: NEGATIVE
Glucose, UA: >=500 mg/dL — AB
Ketones, ur: NEGATIVE mg/dL
Leukocytes,Ua: NEGATIVE
Nitrite: NEGATIVE
Protein, ur: NEGATIVE mg/dL
Specific Gravity, Urine: 1.026 (ref 1.005–1.030)
pH: 6 (ref 5.0–8.0)

## 2022-05-01 LAB — CBG MONITORING, ED
Glucose-Capillary: 549 mg/dL (ref 70–99)
Glucose-Capillary: 338 mg/dL — ABNORMAL HIGH (ref 70–99)
Glucose-Capillary: 270 mg/dL — ABNORMAL HIGH (ref 70–99)
Glucose-Capillary: 402 mg/dL — ABNORMAL HIGH (ref 70–99)
Glucose-Capillary: 496 mg/dL — ABNORMAL HIGH (ref 70–99)

## 2022-05-01 LAB — BASIC METABOLIC PANEL
Anion gap: 9 (ref 5–15)
BUN: 11 mg/dL (ref 6–20)
CO2: 27 mmol/L (ref 22–32)
Calcium: 8 mg/dL — ABNORMAL LOW (ref 8.9–10.3)
Chloride: 91 mmol/L — ABNORMAL LOW (ref 98–111)
Creatinine, Ser: 0.8 mg/dL (ref 0.44–1.00)
GFR, Estimated: >60 mL/min (ref >60)
Glucose, Bld: 646 mg/dL (ref 70–99)
Potassium: 5.1 mmol/L (ref 3.5–5.1)
Sodium: 127 mmol/L — ABNORMAL LOW (ref 135–145)

## 2022-05-01 LAB — BRAIN NATRIURETIC PEPTIDE: B Natriuretic Peptide: 142.2 pg/mL — ABNORMAL HIGH (ref 0.0–100.0)

## 2022-05-01 MED ORDER — FUROSEMIDE 10 MG/ML IJ SOLN
60.0000 mg | Freq: Once | INTRAMUSCULAR | Status: AC
  Administered 2022-05-01: 60 mg via INTRAVENOUS
  Filled 2022-05-01: qty 8

## 2022-05-01 MED ORDER — SODIUM CHLORIDE 0.9% FLUSH
3.0000 mL | Freq: Two times a day (BID) | INTRAVENOUS | Status: DC
  Administered 2022-05-01 – 2022-05-16 (×22): 3 mL via INTRAVENOUS

## 2022-05-01 MED ORDER — INSULIN ASPART 100 UNIT/ML IJ SOLN
5.0000 [IU] | Freq: Once | INTRAMUSCULAR | Status: AC
  Administered 2022-05-01: 5 [IU] via INTRAVENOUS
  Filled 2022-05-01: qty 1

## 2022-05-01 MED ORDER — ATORVASTATIN CALCIUM 10 MG PO TABS
10.0000 mg | ORAL_TABLET | Freq: Every day | ORAL | Status: DC
  Administered 2022-05-01 – 2022-05-20 (×15): 10 mg via ORAL
  Filled 2022-05-01 (×20): qty 1

## 2022-05-01 MED ORDER — ENOXAPARIN SODIUM 40 MG/0.4ML IJ SOSY: 40.0000 mg | PREFILLED_SYRINGE | INTRAMUSCULAR | Status: DC

## 2022-05-01 MED ORDER — ACETAMINOPHEN 325 MG PO TABS
650.0000 mg | ORAL_TABLET | ORAL | Status: DC | PRN
  Administered 2022-05-01 – 2022-05-04 (×4): 650 mg via ORAL
  Filled 2022-05-01 (×4): qty 2

## 2022-05-01 MED ORDER — ONDANSETRON HCL 4 MG/2ML IJ SOLN: 4.0000 mg | Freq: Four times a day (QID) | INTRAMUSCULAR | Status: DC | PRN

## 2022-05-01 MED ORDER — ASPIRIN 81 MG PO TBEC
81.0000 mg | DELAYED_RELEASE_TABLET | Freq: Every day | ORAL | Status: DC
  Administered 2022-05-01 – 2022-05-20 (×20): 81 mg via ORAL
  Filled 2022-05-01 (×20): qty 1

## 2022-05-01 MED ORDER — INSULIN ASPART 100 UNIT/ML IJ SOLN
0.0000 [IU] | Freq: Three times a day (TID) | INTRAMUSCULAR | Status: DC
  Administered 2022-05-02: 15 [IU] via SUBCUTANEOUS
  Administered 2022-05-02: 20 [IU] via SUBCUTANEOUS
  Administered 2022-05-02: 15 [IU] via SUBCUTANEOUS
  Administered 2022-05-03: 11 [IU] via SUBCUTANEOUS
  Administered 2022-05-03 – 2022-05-04 (×3): 20 [IU] via SUBCUTANEOUS
  Administered 2022-05-04: 15 [IU] via SUBCUTANEOUS
  Administered 2022-05-05: 20 [IU] via SUBCUTANEOUS
  Administered 2022-05-05: 7 [IU] via SUBCUTANEOUS
  Administered 2022-05-06: 15 [IU] via SUBCUTANEOUS
  Filled 2022-05-01 (×11): qty 1

## 2022-05-01 MED ORDER — INSULIN ASPART 100 UNIT/ML IJ SOLN
10.0000 [IU] | Freq: Once | INTRAMUSCULAR | Status: AC
  Administered 2022-05-01: 10 [IU] via INTRAVENOUS
  Filled 2022-05-01: qty 1

## 2022-05-01 MED ORDER — INSULIN ASPART 100 UNIT/ML IJ SOLN
10.0000 [IU] | Freq: Once | INTRAMUSCULAR | Status: AC
  Administered 2022-05-01: 10 [IU] via SUBCUTANEOUS
  Filled 2022-05-01: qty 1

## 2022-05-01 MED ORDER — FUROSEMIDE 10 MG/ML IJ SOLN: 40.0000 mg | Freq: Every day | INTRAMUSCULAR | Status: DC

## 2022-05-01 MED ORDER — IRBESARTAN 150 MG PO TABS
75.0000 mg | ORAL_TABLET | Freq: Every day | ORAL | Status: DC
  Administered 2022-05-01 – 2022-05-14 (×6): 75 mg via ORAL
  Filled 2022-05-01 (×14): qty 1

## 2022-05-01 MED ORDER — ENOXAPARIN SODIUM 60 MG/0.6ML IJ SOSY
0.5000 mg/kg | PREFILLED_SYRINGE | INTRAMUSCULAR | Status: DC
  Administered 2022-05-01: 55 mg via SUBCUTANEOUS
  Filled 2022-05-01 (×8): qty 0.6

## 2022-05-01 MED ORDER — HYDRALAZINE HCL 20 MG/ML IJ SOLN
10.0000 mg | Freq: Once | INTRAMUSCULAR | Status: DC
  Filled 2022-05-01: qty 1

## 2022-05-01 MED ORDER — ACETAMINOPHEN 325 MG PO TABS: 650.0000 mg | ORAL_TABLET | ORAL | Status: DC | PRN

## 2022-05-01 MED ORDER — FUROSEMIDE 10 MG/ML IJ SOLN
40.0000 mg | Freq: Two times a day (BID) | INTRAMUSCULAR | Status: DC
  Administered 2022-05-02: 40 mg via INTRAVENOUS
  Filled 2022-05-01: qty 4

## 2022-05-01 MED ORDER — SODIUM CHLORIDE 0.9 % IV SOLN: 250.0000 mL | INTRAVENOUS | Status: DC | PRN

## 2022-05-01 MED ORDER — SODIUM CHLORIDE 0.9% FLUSH
3.0000 mL | INTRAVENOUS | Status: DC | PRN
  Administered 2022-05-12: 3 mL via INTRAVENOUS

## 2022-05-01 MED ORDER — BUPRENORPHINE HCL-NALOXONE HCL 8-2 MG SL SUBL
1.0000 | SUBLINGUAL_TABLET | Freq: Three times a day (TID) | SUBLINGUAL | Status: DC
  Administered 2022-05-01 – 2022-05-20 (×56): 1 via SUBLINGUAL
  Filled 2022-05-01 (×56): qty 1

## 2022-05-01 MED ORDER — INSULIN GLARGINE-YFGN 100 UNIT/ML ~~LOC~~ SOLN
20.0000 [IU] | Freq: Every day | SUBCUTANEOUS | Status: DC
  Administered 2022-05-01: 20 [IU] via SUBCUTANEOUS
  Filled 2022-05-01: qty 0.2

## 2022-05-01 NOTE — Assessment & Plan Note (Signed)
Patient is presenting with several week history of gradually worsening hypervolemia despite reported compliance with her oral Lasix.  She appears markedly hypervolemic on examination, although only mild pulmonary edema noted.  Last echocardiogram in November 2023 with a EF of 60-65% and grade 2 diastolic dysfunction.  - Telemetry monitoring - Start Lasix 40 mg IV twice daily - Strict in and out - Daily weights - No indication at this time for repeat echocardiogram

## 2022-05-01 NOTE — Assessment & Plan Note (Signed)
Patient states that she developed perioral erythema and lesions on her tongue after taking a new blood pressure medicine last night.  At this time, she continues to have a sore throat.  On exam, she has some mild erythema, but no edema.  - Supportive management - Will need to obtain pharmacy records to determine what medication she picked up yesterday

## 2022-05-01 NOTE — Assessment & Plan Note (Signed)
Patient is presenting with markedly elevated blood pressure in the setting of medication noncompliance.  She states she has not taken her irbesartan in several weeks now.  She did start a new blood pressure medicine that she may have had an allergic reaction to.  Unable to access her pharmacy records to determine which medication this is.  Previously well-tolerated home irbesartan restarted in the ED.  Of note, patient has symptoms consistent with underlying OSA that is certainly contributing.  - No indication of endorgan damage at this time - Continue irbesartan 75 mg daily and titrate as needed - Continuous pulse oximetry at nighttime to assess for apnea

## 2022-05-01 NOTE — Assessment & Plan Note (Signed)
Patient presenting with marked hyperglycemia with no evidence of hyperglycemic crisis in the setting of poor outpatient compliance.  Levemir was last filled in February with a 30-day supply.  She received 15 units in the ED with improvement.  - Start Semglee 20 units at bedtime - SSI, resistant - A1c pending

## 2022-05-01 NOTE — Assessment & Plan Note (Signed)
Patient endorses chronic pain for which she states she was previously on Percocet.  PDMP reviewed and last prescription for oxycodone was in November 2023.  - Conservative management and recommend outpatient follow-up with PCP

## 2022-05-01 NOTE — ED Triage Notes (Signed)
Patient to ED via POV for hyperglycemia- patient states sugars over 500. Has not taken insulin today due to "falling asleep." Patient states she "can't stay awake." Hx of CHF. Having SOB- speaking in full sentences without difficulty.

## 2022-05-01 NOTE — Assessment & Plan Note (Signed)
-   Continue home Suboxone 

## 2022-05-01 NOTE — ED Notes (Signed)
Caitlyn Fuller messaged via secure chat to notify of CBG of [phone].

## 2022-05-01 NOTE — H&P (Signed)
History and Physical    Patient: Caitlyn Fuller ZOX:[id number] DOB: February 04, 1975 DOA: 05/01/2022 DOS: the patient was seen and examined on 05/01/2022 PCP: [facility]  Patient coming from: Home  Chief Complaint:  Chief Complaint  Patient presents with   Hyperglycemia   HPI: Caitlyn Fuller is a 47 y.o. female with medical history significant of HFpEF, insulin-dependent type 2 diabetes, hypertension, polysubstance abuse, chronic pain syndrome, morbid obesity, who presents to the ED with complaints of elevated blood sugar.  Caitlyn Fuller states that for the last several weeks, she has been experiencing shortness of breath that is both at rest and with exertion in addition to gradually worsening lower extremity edema and abdominal distention.  She denies any chest pain or palpitations.  In addition, she notes that her family has noticed that she stops breathing while she sleeps and she always lethargic and occasionally falls asleep during the day while talking to people.    She followed up with her PCP yesterday and was started on a new blood pressure medicine; she is unsure what it is called.  Shortly after taking it, she developed erythema around her mouth and sores on her tongue.  This has improved, however she feels that her throat is sore.  She notes that she has not taking her irbesartan for several weeks now.  She is only taking her daily Lasix and Lantus 15 units at bedtime.  ED course: On arrival to the ED, patient was hypertensive at 197/166 with heart rate of 100, she was saturating at 98% on room air.  She was afebrile at 98.2.  Initial blood work notable for CBG of 646, anion gap of 9, BUN 11, creatinine 0.80 with sodium of 127.  CBC demonstrated WBC of 9.7 with hemoglobin of 13.0.  BNP elevated at 142.  Urinalysis was obtained that demonstrated small hematuria and glucosuria. Chest x-ray was obtained that demonstrated cardiomegaly with vascular congestion and  interstitial prominence.  Patient started on Lasix, NovoLog, and irbesartan.  TRH contacted for admission.  Review of Systems: As mentioned in the history of present illness. All other systems reviewed and are negative.  Past Medical History:  Diagnosis Date   Asthma    CHF (congestive heart failure)    Chronic heart failure with preserved ejection fraction (HFpEF)    a. 04/2021 Echo: EF 65-70%, no rwma, GrI DD, mildly dil LA; b. 11/2021 Echo: EF 60-65%, no rwma, mod LVH, GrII DD, nl RV fxn, mildly dil LA, mild MR.   Diabetes mellitus without complication    Hernia    Hypertension    Kidney infection    Morbid obesity    Shingles    Past Surgical History:  Procedure Laterality Date   ABDOMINAL SURGERY     CESAREAN SECTION     CHOLECYSTECTOMY     colonscopy     TUBAL LIGATION     Social History:  reports that she has quit smoking. Her smoking use included cigarettes. She smoked an average of .5 packs per day. She has never used smokeless tobacco. She reports current drug use. Drug: "Crack" cocaine. She reports that she does not drink alcohol.  Allergies  Allergen Reactions   Amlodipine Swelling   Gabapentin Anaphylaxis   Morphine And Related Anaphylaxis   Darvocet [Propoxyphene N-Acetaminophen] Hives   Toradol [Ketorolac Tromethamine] Other (See Comments)    migraines   Ultram [Tramadol] Hives   Vancomycin Itching    Family History  Problem Relation Age of  Onset   Cancer Mother    Heart failure Father     Prior to Admission medications   Medication Sig Start Date End Date Taking? Authorizing Provider  aspirin EC 81 MG tablet Take 1 tablet (81 mg total) by mouth daily. Swallow whole. Patient not taking: Reported on 02/09/2022 11/22/21   Alford Highland, MD  atorvastatin (LIPITOR) 10 MG tablet Take 1 tablet (10 mg total) by mouth daily. Patient not taking: Reported on 02/09/2022 11/22/21   Alford Highland, MD  blood glucose meter kit and supplies KIT Dispense based on  patient and insurance preference. Check sugar 4 times a day before meals and bedtime 11/22/21   Alford Highland, MD  buprenorphine-naloxone (SUBOXONE) 8-2 mg SUBL SL tablet Place 1 tablet under the tongue 3 (three) times daily. 02/02/22   [provider]  furosemide (LASIX) 40 MG tablet Take 1 tablet (40 mg total) by mouth daily. Patient not taking: Reported on 02/09/2022 11/22/21   Alford Highland, MD  insulin aspart (NOVOLOG) 100 UNIT/ML injection 15 units subcutaneous injection prior to meals 11/22/21   Alford Highland, MD  insulin detemir (LEVEMIR) 100 UNIT/ML FlexPen Inject 50 Units into the skin at bedtime. 11/22/21   Alford Highland, MD  Insulin Pen Needle 33G X 5 MM MISC 1 Dose by Does not apply route 3 (three) times daily before meals. 11/22/21   Alford Highland, MD  irbesartan (AVAPRO) 75 MG tablet Take 1 tablet (75 mg total) by mouth daily. 02/12/22 03/14/22  Kirby Crigler, Mir M, MD  potassium chloride SA (KLOR-CON M) 20 MEQ tablet Take 1 tablet (20 mEq total) by mouth daily. 02/11/22 03/13/22  Maryln Gottron, MD    Physical Exam: Vitals:   05/01/22 1518 05/01/22 1520 05/01/22 1800 05/01/22 1830  BP: (!) 197/66 (!) 197/166  (!) 166/97  Pulse: 100   (!) 104  Resp: 18   18  Temp: 98.2 F (36.8 C)     TempSrc: Oral     SpO2: 98%   100%  Weight:   108.9 kg    Physical Exam Vitals and nursing note reviewed.  Constitutional:      General: She is not in acute distress.    Appearance: She is morbidly obese.  HENT:     Head: Normocephalic and atraumatic.     Mouth/Throat:     Mouth: Mucous membranes are moist.     Pharynx: Oropharynx is clear.     Comments: Uvula midline.  Mild posterior oropharynx erythema.  No edema noticed. Eyes:     Conjunctiva/sclera: Conjunctivae normal.     Pupils: Pupils are equal, round, and reactive to light.  Neck:     Comments: Unable to assess for JVD Cardiovascular:     Rate and Rhythm: Normal rate and regular rhythm.     Heart sounds: No  murmur heard.    No gallop.     Comments: Bilateral 2+ pitting edema extending beyond the knees Pulmonary:     Effort: Pulmonary effort is normal. No tachypnea or accessory muscle usage.     Breath sounds: Normal breath sounds. No decreased breath sounds, wheezing, rhonchi or rales.  Abdominal:     General: Bowel sounds are normal. There is distension.     Palpations: Abdomen is soft.     Tenderness: There is no abdominal tenderness. There is no guarding.  Musculoskeletal:     Cervical back: Neck supple. No rigidity or tenderness.  Lymphadenopathy:     Cervical: No cervical adenopathy.  Skin:  General: Skin is warm and dry.  Neurological:     General: No focal deficit present.     Mental Status: She is alert and oriented to person, place, and time. Mental status is at baseline.  Psychiatric:        Mood and Affect: Mood normal.        Behavior: Behavior normal.    Data Reviewed: CBC with WBC of 9.7, hemoglobin 13.0, MCV 81, and platelets of 225 BMP with sodium of 127, potassium 5.1, however significant hemolysis, bicarb 27, glucose 646, BUN 11, creatinine 0.80, anion gap of 9 and GFR above 60 BNP elevated at 142 Urinalysis with glucosuria and small hematuria but no ketonuria, or bacteria noted  EKG personally reviewed.  Sinus rhythm with rate of 93.  No ST or T wave changes concerning for acute ischemia.  DG Chest 2 View  Result Date: 05/01/2022 CLINICAL DATA:  Dyspnea. Volume overload. EXAM: CHEST - 2 VIEW COMPARISON:  03/21/2022 FINDINGS: Lung volumes are low. Mild cardiomegaly. There is vascular congestion with interstitial prominence, possible pulmonary edema. No focal airspace disease. No pleural fluid. No acute osseous abnormalities are seen. IMPRESSION: Cardiomegaly with vascular congestion and interstitial prominence, possible pulmonary edema. Electronically Signed   By: Narda Rutherford M.D.   On: 05/01/2022 17:12    Results are pending, will review when  available.  Assessment and Plan:  * Acute on chronic heart failure with preserved ejection fraction (HFpEF) Patient is presenting with several week history of gradually worsening hypervolemia despite reported compliance with her oral Lasix.  She appears markedly hypervolemic on examination, although only mild pulmonary edema noted.  Last echocardiogram in November 2023 with a EF of 60-65% and grade 2 diastolic dysfunction.  - Telemetry monitoring - Start Lasix 40 mg IV twice daily - Strict in and out - Daily weights - No indication at this time for repeat echocardiogram  Severe hypertension Patient is presenting with markedly elevated blood pressure in the setting of medication noncompliance.  She states she has not taken her irbesartan in several weeks now.  She did start a new blood pressure medicine that she may have had an allergic reaction to.  Unable to access her pharmacy records to determine which medication this is.  Previously well-tolerated home irbesartan restarted in the ED.  Of note, patient has symptoms consistent with underlying OSA that is certainly contributing.  - No indication of endorgan damage at this time - Continue irbesartan 75 mg daily and titrate as needed - Continuous pulse oximetry at nighttime to assess for apnea  Uncontrolled type 2 diabetes mellitus with hyperglycemia, with long-term current use of insulin Patient presenting with marked hyperglycemia with no evidence of hyperglycemic crisis in the setting of poor outpatient compliance.  Levemir was last filled in February with a 30-day supply.  She received 15 units in the ED with improvement.  - Start Semglee 20 units at bedtime - SSI, resistant - A1c pending  Sore throat Patient states that she developed perioral erythema and lesions on her tongue after taking a new blood pressure medicine last night.  At this time, she continues to have a sore throat.  On exam, she has some mild erythema, but no  edema.  - Supportive management - Will need to obtain pharmacy records to determine what medication she picked up yesterday  Chronic pain Patient endorses chronic pain for which she states she was previously on Percocet.  PDMP reviewed and last prescription for oxycodone was in November 2023.  -  Conservative management and recommend outpatient follow-up with PCP  Polysubstance abuse - Continue home Suboxone  Advance Care Planning:   Code Status: Full Code verified by patient  Consults: None  Family Communication: No family at bedside  Severity of Illness: The appropriate patient status for this patient is OBSERVATION. Observation status is judged to be reasonable and necessary in order to provide the required intensity of service to ensure the patient's safety. The patient's presenting symptoms, physical exam findings, and initial radiographic and laboratory data in the context of their medical condition is felt to place them at decreased risk for further clinical deterioration. Furthermore, it is anticipated that the patient will be medically stable for discharge from the hospital within 2 midnights of admission.   Author: Verdene Lennert, MD 05/01/2022 7:33 PM  For on call review [email or web address].

## 2022-05-01 NOTE — ED Provider Notes (Signed)
[facility]    Event Date/Time   First MD Initiated Contact with Patient 05/01/22 [phone]     (approximate)   History   Hyperglycemia   HPI  Caitlyn Fuller is a 47 y.o. female history of type 2 diabetes hypertension polysubstance abuse, chronic heart failure with preserved EF   Patient reports that she was told by her doctor's office after going to see [place] [facility] yesterday that she needed to be admitted to get fluid off and get her diabetes under control.  She reports she did not come in yesterday, but has decided to come today.  Her blood sugar has been high.  She saw the doctor.  She reports use of insulin, but does not very clear on the exact names types and doses states she is administering.  She reports she has not run out of her medication and is using Lasix but despite that she feels like she has gained 15 pounds in the last couple weeks she feels swollen in her belly both legs and also a slight sense of shortness of breath.  She reports that [place] [facility] started her on a brand-new blood pressure medicine yesterday and after taking it caused her to have a reaction with a feeling of tingling or a little bit of swelling around her tongue.  She shows me her tongue I do not see any obvious lesions or angioedema, but she reports it is causing a strange sensation.  Unfortunately she does not know where she got the medication filled, and does not know the name of the medication she was given but reports it was brand-new for her and she just started it yesterday.  She does not know if it was something like lisinopril or not  Patient denies pregnancy.  Reports that he has been homosexual for greater than 5 years  Physical Exam   Triage Vital Signs: ED Triage Vitals  Enc Vitals Group     BP 05/01/22 1518 (!) 197/66     Pulse Rate 05/01/22 1518 100     Resp 05/01/22 1518 18     Temp 05/01/22 1518 98.2 F (36.8 C)     Temp Source 05/01/22  1518 Oral     SpO2 05/01/22 1518 98 %     Weight --      Height --      Head Circumference --      Peak Flow --      Pain Score 05/01/22 1516 10     Pain Loc --      Pain Edu? --      Excl. in GC? --     Most recent vital signs: Vitals:   05/01/22 1518 05/01/22 1520  BP: (!) 197/66 (!) 197/166  Pulse: 100   Resp: 18   Temp: 98.2 F (36.8 C)   SpO2: 98%      General: Awake, no distress.  Oropharynx widely patent.  No obvious tongue abnormality but she reports a strange paresthesia over her tongue and the back of her throat after starting her medication yesterday Do not see any obvious swelling.  She does have a very obese body habitus.  Unable to tell if JVD present CV:  Good peripheral perfusion.  Normal tones and rate Resp:  Normal effort.  Clear bilaterally normal oxygen saturation.  No noted crackles Abd:  No distention.  Other:     ED Results / Procedures / Treatments   Labs (all labs ordered are  listed, but only abnormal results are displayed) Labs Reviewed  BASIC METABOLIC PANEL - Abnormal; Notable for the following components:      Result Value   Sodium 127 (*)    Chloride 91 (*)    Glucose, Bld 646 (*)    Calcium 8.0 (*)    All other components within normal limits  CBC - Abnormal; Notable for the following components:   RBC 5.30 (*)    MCH 24.5 (*)    All other components within normal limits  URINALYSIS, ROUTINE W REFLEX MICROSCOPIC - Abnormal; Notable for the following components:   Color, Urine STRAW (*)    APPearance CLEAR (*)    Glucose, UA >=500 (*)    Hgb urine dipstick SMALL (*)    All other components within normal limits  BRAIN NATRIURETIC PEPTIDE - Abnormal; Notable for the following components:   B Natriuretic Peptide 142.2 (*)    All other components within normal limits  CBG MONITORING, ED - Abnormal; Notable for the following components:   Glucose-Capillary 549 (*)    All other components within normal limits  CBG MONITORING, ED -  Abnormal; Notable for the following components:   Glucose-Capillary 338 (*)    All other components within normal limits  POC URINE PREG, ED  CBG MONITORING, ED  CBG MONITORING, ED  CBG MONITORING, ED  CBG MONITORING, ED  CBG MONITORING, ED  CBG MONITORING, ED  CBG MONITORING, ED  CBG MONITORING, ED  CBG MONITORING, ED  CBG MONITORING, ED     EKG  And interpreted by me at 1725 heart rate 90 QRS 90 QTc 440 Normal sinus rhythm, no evidence of acute ischemia   RADIOLOGY  Chest x-ray interpreted by me as vascular congestion, no acute edema to noted.  However, radiologist read denotes possible pulmonary edema.  Noted  DG Chest 2 View  Result Date: 05/01/2022 CLINICAL DATA:  Dyspnea. Volume overload. EXAM: CHEST - 2 VIEW COMPARISON:  03/21/2022 FINDINGS: Lung volumes are low. Mild cardiomegaly. There is vascular congestion with interstitial prominence, possible pulmonary edema. No focal airspace disease. No pleural fluid. No acute osseous abnormalities are seen. IMPRESSION: Cardiomegaly with vascular congestion and interstitial prominence, possible pulmonary edema. Electronically Signed   By: Narda Rutherford M.D.   On: 05/01/2022 17:12      PROCEDURES:  Critical Care performed: No  Procedures   MEDICATIONS ORDERED IN ED: Medications  irbesartan (AVAPRO) tablet 75 mg (75 mg Oral Given 05/01/22 1632)  insulin aspart (novoLOG) injection 5 Units (has no administration in time range)  insulin aspart (novoLOG) injection 10 Units (10 Units Intravenous Given 05/01/22 1635)  furosemide (LASIX) injection 60 mg (60 mg Intravenous Given 05/01/22 1633)     IMPRESSION / MDM / ASSESSMENT AND PLAN / ED COURSE  I reviewed the triage vital signs and the nursing notes.                              Differential diagnosis includes, but is not limited to, unregulated or out-of-control diabetes, severe hyperglycemia, labs reveal pseudohyponatremia, hyperglycemia.  No elevated anion gap or  reduced CO2.  No evidence of DKA noted at this time.  Her mental status is normal.  She reports feeling somewhat fatigued the last couple days in the setting of the elevated blood sugars as well, but shows no signs or symptoms that would be suggestive of acute hyperglycemic hyperosmolar state  She does appear quite volume  overloaded she has peripheral edema bilaterally equivalent, reports mild developing dyspnea.  She has severely uncontrolled hypertension with a diastolic blood pressure greater than 130.  I am somewhat suspect that she is actually compliant with her medication therapy, she reports compliance but at the same time cannot really detail or tell me the names of medications she is taking or how so    Patient's presentation is most consistent with acute complicated illness / injury requiring diagnostic workup.   The patient is on the cardiac monitor to evaluate for evidence of arrhythmia and/or significant heart rate changes.   ----------------------------------------- 5:35 PM on 05/01/2022 ----------------------------------------- Patient eating [place] sandwich.  Additional insulin coverage ordered.  Patient to be admitted to the hospitalist service with the care of Dr. Huel Cote concerns of volume overload, hypertensive urgency, uncontrolled diabetes  Patient understanding agreeable with plan for admission     FINAL CLINICAL IMPRESSION(S) / ED DIAGNOSES   Final diagnoses:  Anasarca  Hypertensive urgency  Hypervolemia, unspecified hypervolemia type  Uncontrolled type 2 diabetes mellitus with hyperglycemia     Rx / DC Orders   ED Discharge Orders     None        Note:  This document was prepared using Dragon voice recognition software and may include unintentional dictation errors.   Sharyn Creamer, MD 05/01/22 1736

## 2022-05-02 ENCOUNTER — Encounter: Payer: Self-pay | Admitting: Internal Medicine

## 2022-05-02 DIAGNOSIS — Z794 Long term (current) use of insulin: Secondary | ICD-10-CM | POA: Diagnosis not present

## 2022-05-02 DIAGNOSIS — E1165 Type 2 diabetes mellitus with hyperglycemia: Secondary | ICD-10-CM | POA: Diagnosis not present

## 2022-05-02 DIAGNOSIS — I129 Hypertensive chronic kidney disease with stage 1 through stage 4 chronic kidney disease, or unspecified chronic kidney disease: Secondary | ICD-10-CM | POA: Diagnosis not present

## 2022-05-02 DIAGNOSIS — I130 Hypertensive heart and chronic kidney disease with heart failure and stage 1 through stage 4 chronic kidney disease, or unspecified chronic kidney disease: Secondary | ICD-10-CM | POA: Diagnosis present

## 2022-05-02 DIAGNOSIS — E876 Hypokalemia: Secondary | ICD-10-CM | POA: Diagnosis not present

## 2022-05-02 DIAGNOSIS — E873 Alkalosis: Secondary | ICD-10-CM | POA: Diagnosis not present

## 2022-05-02 DIAGNOSIS — G894 Chronic pain syndrome: Secondary | ICD-10-CM | POA: Diagnosis present

## 2022-05-02 DIAGNOSIS — I160 Hypertensive urgency: Secondary | ICD-10-CM | POA: Diagnosis not present

## 2022-05-02 DIAGNOSIS — E8770 Fluid overload, unspecified: Secondary | ICD-10-CM | POA: Diagnosis not present

## 2022-05-02 DIAGNOSIS — E1129 Type 2 diabetes mellitus with other diabetic kidney complication: Secondary | ICD-10-CM | POA: Diagnosis not present

## 2022-05-02 DIAGNOSIS — N179 Acute kidney failure, unspecified: Secondary | ICD-10-CM | POA: Diagnosis not present

## 2022-05-02 DIAGNOSIS — I509 Heart failure, unspecified: Secondary | ICD-10-CM | POA: Diagnosis not present

## 2022-05-02 DIAGNOSIS — E1151 Type 2 diabetes mellitus with diabetic peripheral angiopathy without gangrene: Secondary | ICD-10-CM | POA: Diagnosis present

## 2022-05-02 DIAGNOSIS — J45909 Unspecified asthma, uncomplicated: Secondary | ICD-10-CM | POA: Diagnosis present

## 2022-05-02 DIAGNOSIS — E11649 Type 2 diabetes mellitus with hypoglycemia without coma: Secondary | ICD-10-CM | POA: Diagnosis not present

## 2022-05-02 DIAGNOSIS — D631 Anemia in chronic kidney disease: Secondary | ICD-10-CM | POA: Diagnosis not present

## 2022-05-02 DIAGNOSIS — I10 Essential (primary) hypertension: Secondary | ICD-10-CM | POA: Diagnosis not present

## 2022-05-02 DIAGNOSIS — I5033 Acute on chronic diastolic (congestive) heart failure: Secondary | ICD-10-CM | POA: Diagnosis not present

## 2022-05-02 DIAGNOSIS — I259 Chronic ischemic heart disease, unspecified: Secondary | ICD-10-CM | POA: Diagnosis not present

## 2022-05-02 DIAGNOSIS — Z6841 Body Mass Index (BMI) 40.0 and over, adult: Secondary | ICD-10-CM | POA: Diagnosis not present

## 2022-05-02 DIAGNOSIS — E8721 Acute metabolic acidosis: Secondary | ICD-10-CM | POA: Diagnosis not present

## 2022-05-02 DIAGNOSIS — I5082 Biventricular heart failure: Secondary | ICD-10-CM | POA: Diagnosis present

## 2022-05-02 DIAGNOSIS — N182 Chronic kidney disease, stage 2 (mild): Secondary | ICD-10-CM | POA: Diagnosis not present

## 2022-05-02 DIAGNOSIS — E1122 Type 2 diabetes mellitus with diabetic chronic kidney disease: Secondary | ICD-10-CM | POA: Diagnosis present

## 2022-05-02 DIAGNOSIS — E871 Hypo-osmolality and hyponatremia: Secondary | ICD-10-CM | POA: Diagnosis not present

## 2022-05-02 DIAGNOSIS — R601 Generalized edema: Secondary | ICD-10-CM | POA: Diagnosis not present

## 2022-05-02 DIAGNOSIS — I5023 Acute on chronic systolic (congestive) heart failure: Secondary | ICD-10-CM | POA: Diagnosis not present

## 2022-05-02 DIAGNOSIS — F1910 Other psychoactive substance abuse, uncomplicated: Secondary | ICD-10-CM | POA: Diagnosis present

## 2022-05-02 DIAGNOSIS — I2510 Atherosclerotic heart disease of native coronary artery without angina pectoris: Secondary | ICD-10-CM | POA: Diagnosis present

## 2022-05-02 DIAGNOSIS — G4733 Obstructive sleep apnea (adult) (pediatric): Secondary | ICD-10-CM | POA: Diagnosis present

## 2022-05-02 DIAGNOSIS — G40909 Epilepsy, unspecified, not intractable, without status epilepticus: Secondary | ICD-10-CM | POA: Diagnosis present

## 2022-05-02 DIAGNOSIS — I2729 Other secondary pulmonary hypertension: Secondary | ICD-10-CM | POA: Diagnosis present

## 2022-05-02 LAB — CBC WITH DIFFERENTIAL/PLATELET
Abs Immature Granulocytes: 0.03 10*3/uL (ref 0.00–0.07)
Basophils Absolute: 0 10*3/uL (ref 0.0–0.1)
Basophils Relative: 0 %
Eosinophils Absolute: 0.3 10*3/uL (ref 0.0–0.5)
Eosinophils Relative: 3 %
HCT: 43.3 % (ref 36.0–46.0)
Hemoglobin: 13.3 g/dL (ref 12.0–15.0)
Immature Granulocytes: 0 %
Lymphocytes Relative: 34 %
Lymphs Abs: 3.2 10*3/uL (ref 0.7–4.0)
MCH: 24.9 pg — ABNORMAL LOW (ref 26.0–34.0)
MCHC: 30.7 g/dL (ref 30.0–36.0)
MCV: 80.9 fL (ref 80.0–100.0)
Monocytes Absolute: 0.7 10*3/uL (ref 0.1–1.0)
Monocytes Relative: 7 %
Neutro Abs: 5.3 10*3/uL (ref 1.7–7.7)
Neutrophils Relative %: 56 %
Platelets: 222 10*3/uL (ref 150–400)
RBC: 5.35 MIL/uL — ABNORMAL HIGH (ref 3.87–5.11)
RDW: 14.6 % (ref 11.5–15.5)
WBC: 9.4 10*3/uL (ref 4.0–10.5)
nRBC: 0 % (ref 0.0–0.2)

## 2022-05-02 LAB — BASIC METABOLIC PANEL
Anion gap: 7 (ref 5–15)
BUN: 17 mg/dL (ref 6–20)
CO2: 28 mmol/L (ref 22–32)
Calcium: 8.2 mg/dL — ABNORMAL LOW (ref 8.9–10.3)
Chloride: 93 mmol/L — ABNORMAL LOW (ref 98–111)
Creatinine, Ser: 0.8 mg/dL (ref 0.44–1.00)
GFR, Estimated: >60 mL/min (ref >60)
Glucose, Bld: 358 mg/dL — ABNORMAL HIGH (ref 70–99)
Potassium: 3.7 mmol/L (ref 3.5–5.1)
Sodium: 128 mmol/L — ABNORMAL LOW (ref 135–145)

## 2022-05-02 LAB — BLOOD GAS, VENOUS
Acid-Base Excess: 4.8 mmol/L — ABNORMAL HIGH (ref 0.0–2.0)
Bicarbonate: 29.9 mmol/L — ABNORMAL HIGH (ref 20.0–28.0)
O2 Saturation: 86.6 %
Patient temperature: 37
pCO2, Ven: 45 mmHg (ref 44–60)
pH, Ven: 7.43 (ref 7.25–7.43)
pO2, Ven: 53 mmHg — ABNORMAL HIGH (ref 32–45)

## 2022-05-02 LAB — MAGNESIUM: Magnesium: 1.9 mg/dL (ref 1.7–2.4)

## 2022-05-02 LAB — CBG MONITORING, ED
Glucose-Capillary: 316 mg/dL — ABNORMAL HIGH (ref 70–99)
Glucose-Capillary: 388 mg/dL — ABNORMAL HIGH (ref 70–99)
Glucose-Capillary: 327 mg/dL — ABNORMAL HIGH (ref 70–99)
Glucose-Capillary: 321 mg/dL — ABNORMAL HIGH (ref 70–99)

## 2022-05-02 LAB — GLUCOSE, CAPILLARY: Glucose-Capillary: 375 mg/dL — ABNORMAL HIGH (ref 70–99)

## 2022-05-02 LAB — VITAMIN D 25 HYDROXY (VIT D DEFICIENCY, FRACTURES): Vit D, 25-Hydroxy: 8.33 ng/mL — ABNORMAL LOW (ref 30–100)

## 2022-05-02 LAB — HIV ANTIBODY (ROUTINE TESTING W REFLEX): HIV Screen 4th Generation wRfx: NONREACTIVE

## 2022-05-02 LAB — HEMOGLOBIN A1C
Hgb A1c MFr Bld: 14.3 % — ABNORMAL HIGH (ref 4.8–5.6)
Mean Plasma Glucose: 363.71 mg/dL

## 2022-05-02 MED ORDER — INSULIN GLARGINE-YFGN 100 UNIT/ML ~~LOC~~ SOLN
10.0000 [IU] | Freq: Once | SUBCUTANEOUS | Status: AC
  Administered 2022-05-02: 10 [IU] via SUBCUTANEOUS
  Filled 2022-05-02: qty 0.1

## 2022-05-02 MED ORDER — FLUTICASONE PROPIONATE 50 MCG/ACT NA SUSP
2.0000 | Freq: Two times a day (BID) | NASAL | Status: DC | PRN
  Administered 2022-05-07 – 2022-05-11 (×3): 2 via NASAL
  Filled 2022-05-02: qty 16

## 2022-05-02 MED ORDER — INSULIN GLARGINE-YFGN 100 UNIT/ML ~~LOC~~ SOLN
20.0000 [IU] | Freq: Two times a day (BID) | SUBCUTANEOUS | Status: DC
  Administered 2022-05-02: 20 [IU] via SUBCUTANEOUS
  Filled 2022-05-02 (×2): qty 0.2

## 2022-05-02 MED ORDER — INSULIN GLARGINE-YFGN 100 UNIT/ML ~~LOC~~ SOLN
30.0000 [IU] | Freq: Two times a day (BID) | SUBCUTANEOUS | Status: DC
  Administered 2022-05-02: 30 [IU] via SUBCUTANEOUS
  Filled 2022-05-02 (×2): qty 0.3

## 2022-05-02 MED ORDER — HYDRALAZINE HCL 50 MG PO TABS
50.0000 mg | ORAL_TABLET | Freq: Four times a day (QID) | ORAL | Status: DC | PRN
  Filled 2022-05-02: qty 1

## 2022-05-02 MED ORDER — HYDRALAZINE HCL 20 MG/ML IJ SOLN
10.0000 mg | Freq: Four times a day (QID) | INTRAMUSCULAR | Status: DC | PRN
  Administered 2022-05-02: 10 mg via INTRAVENOUS
  Filled 2022-05-02: qty 1

## 2022-05-02 MED ORDER — CLONAZEPAM 0.25 MG PO TBDP
0.2500 mg | ORAL_TABLET | Freq: Once | ORAL | Status: AC
  Administered 2022-05-02: 0.25 mg via ORAL
  Filled 2022-05-02: qty 1

## 2022-05-02 MED ORDER — INSULIN ASPART 100 UNIT/ML IJ SOLN
5.0000 [IU] | Freq: Three times a day (TID) | INTRAMUSCULAR | Status: DC
  Administered 2022-05-02 (×2): 5 [IU] via SUBCUTANEOUS
  Filled 2022-05-02 (×2): qty 1

## 2022-05-02 MED ORDER — FUROSEMIDE 10 MG/ML IJ SOLN
40.0000 mg | Freq: Every day | INTRAMUSCULAR | Status: DC
  Administered 2022-05-03: 40 mg via INTRAVENOUS
  Filled 2022-05-02: qty 4

## 2022-05-02 NOTE — ED Notes (Signed)
Patient resting with eyes closed. NAD noted. Respirations even and unlabored. 

## 2022-05-02 NOTE — ED Notes (Signed)
Fsbs 321

## 2022-05-02 NOTE — Inpatient Diabetes Management (Addendum)
Inpatient Diabetes Program Recommendations  AACE/ADA: New Consensus Statement on Inpatient Glycemic Control (2015)  Target Ranges:  Prepandial:   less than 140 mg/dL      Peak postprandial:   less than 180 mg/dL (1-2 hours)      Critically ill patients:  140 - 180 mg/dL    Latest Reference Range & Units 09/26/21 05:34 11/17/21 00:40 05/02/22 05:53  Hemoglobin A1C 4.8 - 5.6 % 11.8 (H) 12.5 (H) 14.3 (H)  363 mg/dl   (H): Data is abnormally high  Latest Reference Range & Units 05/01/22 15:18  Sodium 135 - 145 mmol/L 127 (L)  Potassium 3.5 - 5.1 mmol/L 5.1  Chloride 98 - 111 mmol/L 91 (L)  CO2 22 - 32 mmol/L 27  Glucose 70 - 99 mg/dL 540 (HH)  BUN 6 - 20 mg/dL 11  Creatinine 9.81 - 1.91 mg/dL 4.78  Calcium 8.9 - 29.5 mg/dL 8.0 (L)  Anion gap 5 - 15  9  (HH): Data is critically high (L): Data is abnormally low  Latest Reference Range & Units 05/01/22 15:17 05/01/22 17:26 05/01/22 18:29 05/01/22 19:35 05/01/22 21:49  Glucose-Capillary 70 - 99 mg/dL 621 (HH)  10 units Novolog  338 (H)  5 units Novolog  270 (H) 402 (H) 496 (H)  10 units Novolog  20 units Semglee  [facility]): Data is critically high (H): Data is abnormally high  Latest Reference Range & Units 05/02/22 07:55  Glucose-Capillary 70 - 99 mg/dL 308 (H)  15 units Novolog   (H): Data is abnormally high  Admit with:  Hyperglycemia Acute on chronic heart failure with preserved ejection fraction  Severe hypertension   History: DM, Polysubstance abuse   Home DM Meds: Levemir 50 units QHS       Novolog 15 units TID   Current Orders: Semglee 20 units BID     Novolog Resistant Correction Scale/ SSI (0-20 units) TID AC    Current A1c Pending  Note Semglee increased to 20 units BID this AM   MD- May also consider starting Novolog Meal Coverage:  Novolog 5 units TID with meals (1/3 total home dose to start) HOLD if pt NPO HOLD if pt eats <50% meals   Addendum 10:45am--Met w/ pt in the ED (partner at  bedside--Partner had brought [employer] food--not sure if the food was for pt or the partner).  Reviewed pt's current A1c of 14.3% with pt and partner--They were both surprised to hear it was so high--Partner asked me what the number should be and we reviewed goal A1c of 7% and goal CBGs for home.  Pt and Partner gave me several excuses for poor CBG control at home: They just moved and couldn't find the insulin, partner and pt don't know which and how much insulin to take b/c the doctors always tell them something different, they couldn't find pt's CBG meter and couldn't check CBGs, they miss MD appts b/c the appts are too early and pt can't get up that early.  Partner and pt told me pt has not taken any of the "Green insulin" (I assume she meant the Levemir given there is green color to the pen) since last hospitalization b/c she couldn't find the Levemir insulin and pt has been taking 15 units of the "Orange Insulin" (the Novolog) but just started back on it b/c she couldn't find the Novolog insulin either b/c they recently moved into a new home.  Pt NOT checking CBGs on regular basis (states she couldn't find  her meter either but they finally found it).  Partner and pt stated to me they would like a list of all the meds pt should be taking.  I questioned them both and asked them why they have not reached out to their PCP to clarify medications and doses and they made more excuses stating all the doctors they see say different things.  I was frank with pt and partner and discussed with them that better CBG control is imperative of pt wishes to have any kind of quality of life moving forward.  I strongly encouraged pt to take her insulin as prescribed and to check CBGs at least TID [facility] at home to know where her numbers are.  Encouraged pt to seek a referral from her PCP for an ENDO, however, if pt is not compliant with her meds and appts, I can't see that an ENDO can help.  Reiterated to pt and partner that  nutrition, exercise, and being consistent with meds is the only way pt will help get her CBGs under better control.  I reviewed with pt and partner the Insulin regimen we have her on currently in the hospital (semglee and novolog) and how we are dosing.  Discussed with them both that we can see how much insulin she is requiring over the next 24 hours to help better determine d/c doses.       --Will follow patient during hospitalization--  Ambrose Finland RN, MSN, CDCES Diabetes Coordinator Inpatient Glycemic Control Team Team Pager: [phone] (8a-5p)

## 2022-05-02 NOTE — Discharge Instructions (Addendum)

## 2022-05-02 NOTE — ED Notes (Signed)
Meds given  pt alert 

## 2022-05-02 NOTE — ED Notes (Addendum)
Pt sitting up in bed on her scratch off cards.  No acute distress.  Pt alert   skin warm and dry.  Pt waiting on admission bed.  Meds given

## 2022-05-02 NOTE — Progress Notes (Signed)
       CROSS COVER NOTE  NAME: Caitlyn Fuller MRN: [id number] DOB : [birth year] ATTENDING PHYSICIAN: Gillis Santa, MD    Date of Service   05/02/2022   HPI/Events of Note   Report/Request Mssage received from RN "Admitted for hyperglycemia and CHF exacerbation. Her evening BP was 189/119. Gave  hydralazine iv push. Pt is saying she is having panic attack afterwards from it. Says she can't breath, pacing back and forth. Recent vitals 154/81, HR 96 Spo2 100% on room air. "   Interventions   Assessment/Plan:  Klonopin IV hydralazine discontinued       To reach the provider On-Call:   7AM- 7PM see care teams to locate the attending and reach out to them via [email or web address]. Password: [email or web address] 7PM-7AM contact night-coverage If you still have difficulty reaching the appropriate provider, please page the [facility] (Director on Call) for [facility] on amion for assistance  This document was prepared using Conservation officer, historic buildings and may include unintentional dictation errors.  Bishop Limbo DNP, MBA, FNP-BC, PMHNP-BC Nurse Practitioner [facility] Pager [phone]

## 2022-05-02 NOTE — ED Notes (Signed)
Patient eating lunch tray.

## 2022-05-02 NOTE — Progress Notes (Signed)
[facility] Progress Note  Patient: Caitlyn Fuller    ZOX:[id number]  DOA: 05/01/2022     Date of Service: the patient was seen and examined on 05/02/2022  Chief Complaint  Patient presents with   Hyperglycemia   Brief hospital course: Caitlyn Fuller is a 47 y.o. female with medical history significant of HFpEF, insulin-dependent type 2 diabetes, hypertension, polysubstance abuse, chronic pain syndrome, morbid obesity, who presents to the ED with complaints of elevated blood sugar.  Patient is noncompliant with her medications   Assessment and Plan:  # Hyperglycemia, uncontrolled diabetes mellitus due to noncompliance IDDM T2 Started Semglee 30 units twice daily NovoLog 5 units 3 times daily and sliding scale Monitor CBG, continue diabetic diet Diabetic coordinator consulted for further management and counseling   # Accelerated hypertension and diastolic CHF Uncontrolled blood pressure, BP 197/66 on admission Continue irbesartan 75 mg p.o. daily home dose Use hydralazine p.o. or IV as needed as per parameters Continue Lasix 40 mg IV daily Monitor BP and titrate medications accordingly  # Chronic pain Patient endorses chronic pain for which she states she was previously on Percocet.  PDMP reviewed and last prescription for oxycodone was in November 2023. - Conservative management and recommend outpatient follow-up with PCP   # Polysubstance abuse - Continue home Suboxone  Sore throat Patient states that she developed perioral erythema and lesions on her tongue after taking a new blood pressure medicine last night.  At this time, she continues to have a sore throat.  On exam, she has some mild erythema, but no edema. - Supportive management  # Obesity Body mass index is 46.89 kg/m.  Interventions: Calorie restricted diet and daily exercise advised to lose body weight.  Lifestyle modification discussed.       Diet: Diabetic diet DVT Prophylaxis:  Subcutaneous Lovenox   Advance goals of care discussion: Full code  Family Communication: family was not present at bedside, at the time of interview.  The pt provided permission to discuss medical plan with the family. Opportunity was given to ask question and all questions were answered satisfactorily.   Disposition:  Pt is from Home, admitted with diabetes and accelerated blood pressure, still has uncontrolled diabetes, which precludes a safe discharge. Discharge to home, when clinically stable.  Subjective: No significant events overnight, patient was sleepy and dozing off, stated that her sugar is running high that while she feels like they are otherwise no new complaints no shortness of breath, no chest pain or palpitations.   Physical Exam: General: NAD, lying comfortably Appear in no distress, affect appropriate Eyes: PERRLA ENT: Oral Mucosa Clear, moist  Neck: no JVD,  Cardiovascular: S1 and S2 Present, no Murmur,  Respiratory: good respiratory effort, Bilateral Air entry equal and Decreased, no Crackles, no wheezes Abdomen: Bowel Sound present, Soft and no tenderness,  Skin: no rashes Extremities: no Pedal edema, no calf tenderness Neurologic: without any new focal findings Gait not checked due to patient safety concerns  Vitals:   05/02/22 0411 05/02/22 0803 05/02/22 0907 05/02/22 1209  BP: (!) 155/80 (!) 160/79 (!) 156/81   Pulse: 91 83 82   Resp: Temp: 98 F (36.7 C) 97.8 F (36.6 C)  98.3 F (36.8 C)  TempSrc: Axillary Oral  Oral  SpO2: 98% 100% 100%   Weight:       No intake or output data in the 24 hours ending 05/02/22 1324 Filed Weights   05/01/22 1800  Weight:  108.9 kg    Data Reviewed: I have personally reviewed and interpreted daily labs, tele strips, imagings as discussed above. I reviewed all nursing notes, pharmacy notes, vitals, pertinent old records I have discussed plan of care as described above with RN and  patient/family.  CBC: Recent Labs  Lab 05/01/22 1518 05/02/22 0553  WBC 9.7 9.4  NEUTROABS  --  5.3  HGB 13.0 13.3  HCT 43.0 43.3  MCV 81.1 80.9  PLT 225 222   Basic Metabolic Panel: Recent Labs  Lab 05/01/22 1518 05/02/22 0553  NA 127* 128*  K 5.1 3.7  CL 91* 93*  CO2 27 28  GLUCOSE 646* 358*  BUN 11 17  CREATININE 0.80 0.80  CALCIUM 8.0* 8.2*  MG  --  1.9    Studies: DG Chest 2 View  Result Date: 05/01/2022 CLINICAL DATA:  Dyspnea. Volume overload. EXAM: CHEST - 2 VIEW COMPARISON:  03/21/2022 FINDINGS: Lung volumes are low. Mild cardiomegaly. There is vascular congestion with interstitial prominence, possible pulmonary edema. No focal airspace disease. No pleural fluid. No acute osseous abnormalities are seen. IMPRESSION: Cardiomegaly with vascular congestion and interstitial prominence, possible pulmonary edema. Electronically Signed   By: Narda Rutherford M.D.   On: 05/01/2022 17:12    Scheduled Meds:  aspirin EC  81 mg Oral Daily   atorvastatin  10 mg Oral Daily   buprenorphine-naloxone  1 tablet Sublingual TID   enoxaparin (LOVENOX) injection  0.5 mg/kg Subcutaneous Q24H   furosemide  40 mg Intravenous BID   hydrALAZINE  10 mg Intravenous Once   insulin aspart  0-20 Units Subcutaneous TID WC   insulin aspart  5 Units Subcutaneous TID WC   insulin glargine-yfgn  10 Units Subcutaneous Once   insulin glargine-yfgn  30 Units Subcutaneous BID   irbesartan  75 mg Oral Daily   sodium chloride flush  3 mL Intravenous Q12H   Continuous Infusions:  sodium chloride     PRN Meds: sodium chloride, acetaminophen, hydrALAZINE **OR** hydrALAZINE, ondansetron (ZOFRAN) IV, sodium chloride flush  Time spent: 55 minutes  Author: Gillis Santa. MD [facility] 05/02/2022 1:24 PM  To reach On-call, see care teams to locate the attending and reach out to them via [email or web address]. If 7PM-7AM, please contact night-coverage If you still have difficulty reaching the  attending provider, please page the [facility] (Director on Call) for [facility] on amion for assistance.

## 2022-05-02 NOTE — ED Notes (Signed)
Breakfast tray at bedside 

## 2022-05-02 NOTE — Consult Note (Addendum)
   Heart Failure Nurse Navigator  HfpEF 60 to 65%.  Moderate LVH.  Grade 2 diastolic dysfunction.  Mild mitral regurgitation.   She presented to the emergency room with elevated blood sugars.  Blood pressure was also noted to be 197/165.  BNP was 142.  She has a BMI of 46.89.  Chest x-ray revealed cardiomegaly with vascular congestion and interstitial prominence.  Comorbidities:  Asthma Diabetes Hypertension Morbid obesity Substance abuse  Medications:  Aspirin 81 mg daily Atorvastatin 10 mg daily Furosemide 40 mg IV 2 times a day Irbesartan 75 mg daily   Labs:  Sodium 128, potassium 3.7, chloride 93, CO2 28, BUN 17, creatinine 0.8, estimated GFR greater than 60, hemoglobin 13.3, hematocrit 43.3.  Globin A1c 14.3 up from 5 months ago which was 12.5. Weight 108.9 kg Intake not documented Output not documented  Initial meeting on this admission with patient in the emergency room.  She is lying in bed in no acute distress, significant other is at the bedside.  Patient and significant other state that she has been following a low-sodium diet and not eating any sweets nor eating restaurant food.  She has also been compliant with her fluid restriction of no more than 64 ounces daily.  She also states that she has been weighing herself daily as her scale is in the kitchen and she had not seen a weight increase.  States that she had been compliant with her medications.  I also discussed her 6 no-shows to the outpatient heart failure clinic and both the patient and her significant other stated it was due to them moving frequently and not having a working vehicle. They state they are now settled in a house.  Patient was reminded that she has located on the back of her medicaid  card there is a  number for transportation but she has to be aware  and that she has to call them 3 days before the appointment to arrange transportation.  Call to patient's pharmacy which is listed as [facility], spoke with Tax inspector.  She looked at patient's profile, she had filled her Irbesartan, Lasix and Suboxone on April 17, prior to that she had filled those on December 12.  States that her NovoLog and Levemir insulins have not been filled since February 15.   Discussed patient no shows in the heart failure clinic, which is now 6 times, that if patient wishes to be seen she will have to call the clinic to make that appointment time.  In the past she has even "confirmed that she was coming but ended up as a "no show.'  Will continue to follow.  Tresa Endo RN [facility]

## 2022-05-03 DIAGNOSIS — I5033 Acute on chronic diastolic (congestive) heart failure: Secondary | ICD-10-CM | POA: Diagnosis not present

## 2022-05-03 LAB — GLUCOSE, CAPILLARY
Glucose-Capillary: 380 mg/dL — ABNORMAL HIGH (ref 70–99)
Glucose-Capillary: 362 mg/dL — ABNORMAL HIGH (ref 70–99)
Glucose-Capillary: 274 mg/dL — ABNORMAL HIGH (ref 70–99)
Glucose-Capillary: 337 mg/dL — ABNORMAL HIGH (ref 70–99)

## 2022-05-03 LAB — BASIC METABOLIC PANEL
Anion gap: 5 (ref 5–15)
BUN: 25 mg/dL — ABNORMAL HIGH (ref 6–20)
CO2: 32 mmol/L (ref 22–32)
Calcium: 8.2 mg/dL — ABNORMAL LOW (ref 8.9–10.3)
Chloride: 92 mmol/L — ABNORMAL LOW (ref 98–111)
Creatinine, Ser: 0.93 mg/dL (ref 0.44–1.00)
GFR, Estimated: >60 mL/min (ref >60)
Glucose, Bld: 436 mg/dL — ABNORMAL HIGH (ref 70–99)
Potassium: 4.1 mmol/L (ref 3.5–5.1)
Sodium: 129 mmol/L — ABNORMAL LOW (ref 135–145)

## 2022-05-03 LAB — MAGNESIUM: Magnesium: 1.9 mg/dL (ref 1.7–2.4)

## 2022-05-03 LAB — CBC
HCT: 39.3 % (ref 36.0–46.0)
Hemoglobin: 12.3 g/dL (ref 12.0–15.0)
MCH: 25 pg — ABNORMAL LOW (ref 26.0–34.0)
MCHC: 31.3 g/dL (ref 30.0–36.0)
MCV: 79.9 fL — ABNORMAL LOW (ref 80.0–100.0)
Platelets: 210 10*3/uL (ref 150–400)
RBC: 4.92 MIL/uL (ref 3.87–5.11)
RDW: 14.6 % (ref 11.5–15.5)
WBC: 11.4 10*3/uL — ABNORMAL HIGH (ref 4.0–10.5)
nRBC: 0 % (ref 0.0–0.2)

## 2022-05-03 LAB — PHOSPHORUS: Phosphorus: 4 mg/dL (ref 2.5–4.6)

## 2022-05-03 MED ORDER — FUROSEMIDE 10 MG/ML IJ SOLN
4.0000 mg/h | INTRAVENOUS | Status: DC
  Administered 2022-05-03 – 2022-05-06 (×3): 4 mg/h via INTRAVENOUS
  Administered 2022-05-08 – 2022-05-10 (×3): 8 mg/h via INTRAVENOUS
  Administered 2022-05-11: 10 mg/h via INTRAVENOUS
  Administered 2022-05-13: 4 mg/h via INTRAVENOUS
  Filled 2022-05-03 (×8): qty 20

## 2022-05-03 MED ORDER — METOPROLOL TARTRATE 50 MG PO TABS
50.0000 mg | ORAL_TABLET | Freq: Two times a day (BID) | ORAL | Status: DC
  Administered 2022-05-03 – 2022-05-04 (×2): 50 mg via ORAL
  Filled 2022-05-03 (×3): qty 1

## 2022-05-03 MED ORDER — INSULIN GLARGINE-YFGN 100 UNIT/ML ~~LOC~~ SOLN
40.0000 [IU] | Freq: Two times a day (BID) | SUBCUTANEOUS | Status: DC
  Administered 2022-05-03 – 2022-05-04 (×3): 40 [IU] via SUBCUTANEOUS
  Filled 2022-05-03 (×3): qty 0.4

## 2022-05-03 MED ORDER — INSULIN ASPART 100 UNIT/ML IJ SOLN
15.0000 [IU] | Freq: Three times a day (TID) | INTRAMUSCULAR | Status: DC
  Administered 2022-05-03 – 2022-05-04 (×6): 15 [IU] via SUBCUTANEOUS
  Filled 2022-05-03 (×6): qty 1

## 2022-05-03 MED ORDER — HYDROXYZINE HCL 25 MG PO TABS
25.0000 mg | ORAL_TABLET | Freq: Three times a day (TID) | ORAL | Status: DC | PRN
  Administered 2022-05-03 – 2022-05-04 (×2): 25 mg via ORAL
  Filled 2022-05-03 (×5): qty 1

## 2022-05-03 NOTE — Progress Notes (Signed)
[facility] Progress Note  Patient: Caitlyn Fuller    ZOX:[id number]  DOA: 05/01/2022     Date of Service: the patient was seen and examined on 05/03/2022  Chief Complaint  Patient presents with   Hyperglycemia   Brief hospital course: HARBOUR NORDMEYER is a 47 y.o. female with medical history significant of HFpEF, insulin-dependent type 2 diabetes, hypertension, polysubstance abuse, chronic pain syndrome, morbid obesity, who presents to the ED with complaints of elevated blood sugar.  Patient is noncompliant with her medications   Assessment and Plan:  # Hyperglycemia, uncontrolled diabetes mellitus due to noncompliance IDDM T2 4/24 increased  Semglee 40 units twice daily 4/24 Increased NovoLog 15 units 3 times daily and sliding scale Monitor CBG, continue diabetic diet Diabetic coordinator consulted for further management and counseling   Anasarca, diastolic CHF patient has significant bilateral lower extremity edema S/p Lasix IV 40 daily 4/24 started Lasix IV infusion to gradually diurese   # Accelerated hypertension  Uncontrolled blood pressure, BP 197/66 on admission Continue irbesartan 75 mg p.o. daily home dose Use hydralazine p.o. or IV as needed as per parameters S/p Lasix 40 mg IV daily Monitor BP and titrate medications accordingly 4/24 started Lopressor 50 mg p.o. twice daily with holding parameters  # Chronic pain Patient endorses chronic pain for which she states she was previously on Percocet.  PDMP reviewed and last prescription for oxycodone was in November 2023. - Conservative management and recommend outpatient follow-up with PCP   # Polysubstance abuse - Continue home Suboxone  Sore throat Patient states that she developed perioral erythema and lesions on her tongue after taking a new blood pressure medicine last night.  At this time, she continues to have a sore throat.  On exam, she has some mild erythema, but no edema. - Supportive  management  # Obesity Body mass index is 46.89 kg/m.  Interventions: Calorie restricted diet and daily exercise advised to lose body weight.  Lifestyle modification discussed.       Diet: Diabetic diet DVT Prophylaxis: Subcutaneous Lovenox   Advance goals of care discussion: Full code  Family Communication: family was not present at bedside, at the time of interview.  The pt provided permission to discuss medical plan with the family. Opportunity was given to ask question and all questions were answered satisfactorily.   Disposition:  Pt is from Home, admitted with diabetes and accelerated blood pressure, still has uncontrolled diabetes, which precludes a safe discharge. Discharge to home, when clinically stable.  Subjective: No significant events overnight, patient was given a dose of Klonopin for anxiety, no any other active issues.  Patient still does not feel good, complaining of bilateral lower extremity edema and volume overload.  Still has uncontrolled blood glucose.  We will continue above management. Patient denies any chest pain or palpitations, no shortness of breath.   Physical Exam: General: NAD, lying comfortably Appear in no distress, affect appropriate Eyes: PERRLA ENT: Oral Mucosa Clear, moist  Neck: no JVD,  Cardiovascular: S1 and S2 Present, no Murmur,  Respiratory: good respiratory effort, Bilateral Air entry equal and Decreased, no Crackles, no wheezes Abdomen: Bowel Sound present, Soft and no tenderness,  Skin: no rashes Extremities: 4+ Pedal edema, no calf tenderness Neurologic: without any new focal findings Gait not checked due to patient safety concerns  Vitals:   05/02/22 2348 05/03/22 0339 05/03/22 0818 05/03/22 1149  BP: (!) 160/101 138/68 (!) 180/101 (!) 159/93  Pulse: 96 97 (!) 101 93  Resp:  Temp: 97.8 F (36.6 C) 97.8 F (36.6 C) 98.2 F (36.8 C) 98.6 F (37 C)  TempSrc:  Oral    SpO2: 98% 93% 98% 100%  Weight:  112.3 kg       Intake/Output Summary (Last 24 hours) at 05/03/2022 1643 Last data filed at 05/03/2022 0716 Gross per 24 hour  Intake 714 ml  Output --  Net 714 ml   Filed Weights   05/01/22 1800 05/03/22 0339  Weight: 108.9 kg 112.3 kg    Data Reviewed: I have personally reviewed and interpreted daily labs, tele strips, imagings as discussed above. I reviewed all nursing notes, pharmacy notes, vitals, pertinent old records I have discussed plan of care as described above with RN and patient/family.  CBC: Recent Labs  Lab 05/01/22 1518 05/02/22 0553 05/03/22 0514  WBC 9.7 9.4 11.4*  NEUTROABS  --  5.3  --   HGB 13.0 13.3 12.3  HCT 43.0 43.3 39.3  MCV 81.1 80.9 79.9*  PLT 225 222 210   Basic Metabolic Panel: Recent Labs  Lab 05/01/22 1518 05/02/22 0553 05/03/22 0514  NA 127* 128* 129*  K 5.1 3.7 4.1  CL 91* 93* 92*  CO2 27 28 32  GLUCOSE 646* 358* 436*  BUN 11 17 25*  CREATININE 0.80 0.80 0.93  CALCIUM 8.0* 8.2* 8.2*  MG  --  1.9 1.9  PHOS  --   --  4.0    Studies: No results found.  Scheduled Meds:  aspirin EC  81 mg Oral Daily   atorvastatin  10 mg Oral Daily   buprenorphine-naloxone  1 tablet Sublingual TID   enoxaparin (LOVENOX) injection  0.5 mg/kg Subcutaneous Q24H   hydrALAZINE  10 mg Intravenous Once   insulin aspart  0-20 Units Subcutaneous TID WC   insulin aspart  15 Units Subcutaneous TID WC   insulin glargine-yfgn  40 Units Subcutaneous BID   irbesartan  75 mg Oral Daily   metoprolol tartrate  50 mg Oral BID   sodium chloride flush  3 mL Intravenous Q12H   Continuous Infusions:  sodium chloride     furosemide (LASIX) 200 mg in dextrose 5 % 100 mL (2 mg/mL) infusion 4 mg/hr (05/03/22 1552)   PRN Meds: sodium chloride, acetaminophen, fluticasone, [DISCONTINUED] hydrALAZINE **OR** hydrALAZINE, hydrOXYzine, ondansetron (ZOFRAN) IV, sodium chloride flush  Time spent: 50 minutes  Author: Gillis Santa. MD [facility] 05/03/2022 4:43 PM  To  reach On-call, see care teams to locate the attending and reach out to them via [email or web address]. If 7PM-7AM, please contact night-coverage If you still have difficulty reaching the attending provider, please page the [facility] (Director on Call) for [facility] on amion for assistance.

## 2022-05-03 NOTE — Inpatient Diabetes Management (Signed)
Inpatient Diabetes Program Recommendations  AACE/ADA: New Consensus Statement on Inpatient Glycemic Control (2015)  Target Ranges:  Prepandial:   less than 140 mg/dL      Peak postprandial:   less than 180 mg/dL (1-2 hours)      Critically ill patients:  140 - 180 mg/dL    Latest Reference Range & Units 05/03/22 05:14  Glucose 70 - 99 mg/dL 161 (H)    Latest Reference Range & Units 05/02/22 07:55 05/02/22 12:07 05/02/22 13:55 05/02/22 16:30 05/02/22 20:48  Glucose-Capillary 70 - 99 mg/dL 096 (H) 045 (H) 409 (H) 321 (H) 375 (H)   Review of Glycemic Control  Diabetes history: DM2 Outpatient Diabetes medications: Levemir 50 units QHS, Novolog 15 units TID with meals Current orders for Inpatient glycemic control: Semglee 30 units BID, Novolog 5 units TID with meals, Novolog 0-20 units TID with meals  Inpatient Diabetes Program Recommendations:    Insulin: Lab glucose 436 mg/dl today.  Please consider increasing Semglee to 40 units BID and meal coverage to Novolog 15 units TID with meals.  Thanks, Orlando Penner, RN, MSN, CDCES Diabetes Coordinator Inpatient Diabetes Program [phone] (Team Pager from 8am to 5pm)

## 2022-05-03 NOTE — Progress Notes (Signed)
   Heart Failure Nurse Navigator Note  Met with patient today, she is sitting up in bed,starting to eat breakfast.  She states she does not feel very good today, is still very swollen. She states at home she had been compliant with daily weights and her meds.  Had not noticed a weight gain.  Discussed her non compliance with no shows to the outpatient heart failure clinic, she has excuses that they do not have a car or her phone does not work, when I questioned why she does not call and cancel her appointments.  Informed that if she wants to be seen in the heart failure clinic she will have to call and make her own appointment she will not be given one at discharge.  She had no further questions.  Tresa Endo RN [facility]

## 2022-05-04 DIAGNOSIS — I5033 Acute on chronic diastolic (congestive) heart failure: Secondary | ICD-10-CM | POA: Diagnosis not present

## 2022-05-04 LAB — GLUCOSE, CAPILLARY
Glucose-Capillary: 445 mg/dL — ABNORMAL HIGH (ref 70–99)
Glucose-Capillary: 198 mg/dL — ABNORMAL HIGH (ref 70–99)
Glucose-Capillary: 174 mg/dL — ABNORMAL HIGH (ref 70–99)
Glucose-Capillary: 318 mg/dL — ABNORMAL HIGH (ref 70–99)
Glucose-Capillary: 337 mg/dL — ABNORMAL HIGH (ref 70–99)

## 2022-05-04 LAB — BASIC METABOLIC PANEL
Anion gap: 7 (ref 5–15)
BUN: 32 mg/dL — ABNORMAL HIGH (ref 6–20)
CO2: 30 mmol/L (ref 22–32)
Calcium: 8.2 mg/dL — ABNORMAL LOW (ref 8.9–10.3)
Chloride: 95 mmol/L — ABNORMAL LOW (ref 98–111)
Creatinine, Ser: 0.95 mg/dL (ref 0.44–1.00)
GFR, Estimated: >60 mL/min (ref >60)
Glucose, Bld: 396 mg/dL — ABNORMAL HIGH (ref 70–99)
Potassium: 4.1 mmol/L (ref 3.5–5.1)
Sodium: 132 mmol/L — ABNORMAL LOW (ref 135–145)

## 2022-05-04 LAB — PHOSPHORUS: Phosphorus: 4.1 mg/dL (ref 2.5–4.6)

## 2022-05-04 LAB — CBC
HCT: 44 % (ref 36.0–46.0)
Hemoglobin: 13.2 g/dL (ref 12.0–15.0)
MCH: 24.9 pg — ABNORMAL LOW (ref 26.0–34.0)
MCHC: 30 g/dL (ref 30.0–36.0)
MCV: 82.9 fL (ref 80.0–100.0)
Platelets: 241 10*3/uL (ref 150–400)
RBC: 5.31 MIL/uL — ABNORMAL HIGH (ref 3.87–5.11)
RDW: 14.9 % (ref 11.5–15.5)
WBC: 11 10*3/uL — ABNORMAL HIGH (ref 4.0–10.5)
nRBC: 0 % (ref 0.0–0.2)

## 2022-05-04 LAB — MAGNESIUM: Magnesium: 1.9 mg/dL (ref 1.7–2.4)

## 2022-05-04 MED ORDER — METOPROLOL TARTRATE 50 MG PO TABS
50.0000 mg | ORAL_TABLET | Freq: Two times a day (BID) | ORAL | Status: DC
  Administered 2022-05-04 – 2022-05-06 (×4): 50 mg via ORAL
  Filled 2022-05-04 (×6): qty 1

## 2022-05-04 MED ORDER — INSULIN GLARGINE-YFGN 100 UNIT/ML ~~LOC~~ SOLN
50.0000 [IU] | Freq: Two times a day (BID) | SUBCUTANEOUS | Status: DC
  Administered 2022-05-04: 50 [IU] via SUBCUTANEOUS
  Filled 2022-05-04 (×2): qty 0.5

## 2022-05-04 MED ORDER — METOPROLOL TARTRATE 50 MG PO TABS: 75.0000 mg | ORAL_TABLET | Freq: Two times a day (BID) | ORAL | Status: DC

## 2022-05-04 MED ORDER — INSULIN ASPART 100 UNIT/ML IJ SOLN
10.0000 [IU] | Freq: Once | INTRAMUSCULAR | Status: AC
  Administered 2022-05-04: 10 [IU] via INTRAVENOUS
  Filled 2022-05-04: qty 1

## 2022-05-04 NOTE — Progress Notes (Addendum)
[facility] Progress Note  Patient: Caitlyn Fuller    ZOX:[id number]  DOA: 05/01/2022     Date of Service: the patient was seen and examined on 05/04/2022  Chief Complaint  Patient presents with   Hyperglycemia   Brief hospital course: Caitlyn Fuller is a 47 y.o. female with medical history significant of HFpEF, insulin-dependent type 2 diabetes, hypertension, polysubstance abuse, chronic pain syndrome, morbid obesity, who presents to the ED with complaints of elevated blood sugar.  Patient is noncompliant with her medications   Assessment and Plan:  # Hyperglycemia, uncontrolled diabetes mellitus due to noncompliance IDDM T2 4/25 increased  Semglee 50 units twice daily 4/24 Increased NovoLog 15 units 3 times daily and sliding scale 4/25 NovoLog 10 units IV given due to high blood sugar Monitor CBG, continue diabetic diet Diabetic coordinator consulted for further management and counseling   # Anasarca, diastolic CHF patient has significant bilateral lower extremity edema S/p Lasix IV 40 daily 4/24 started Lasix IV infusion to gradually diurese   # Accelerated hypertension  Uncontrolled blood pressure, BP 197/66 on admission Continue irbesartan 75 mg p.o. daily home dose Use hydralazine p.o. or IV as needed as per parameters S/p Lasix 40 mg IV daily Monitor BP and titrate medications accordingly 4/24 started Lopressor 50 mg p.o. twice daily with holding parameters  # Chronic pain Patient endorses chronic pain for which she states she was previously on Percocet.  PDMP reviewed and last prescription for oxycodone was in November 2023. - Conservative management and recommend outpatient follow-up with PCP   # Polysubstance abuse - Continue home Suboxone  # Sore throat Patient states that she developed perioral erythema and lesions on her tongue after taking a new blood pressure medicine last night.  At this time, she continues to have a sore throat.  On exam,  she has some mild erythema, but no edema. - Supportive management  # Obesity Body mass index is 46.89 kg/m.  Interventions: Calorie restricted diet and daily exercise advised to lose body weight.  Lifestyle modification discussed.       Diet: Diabetic diet DVT Prophylaxis: Subcutaneous Lovenox   Advance goals of care discussion: Full code  Family Communication: family was not present at bedside, at the time of interview.  The pt provided permission to discuss medical plan with the family. Opportunity was given to ask question and all questions were answered satisfactorily.   Disposition:  Pt is from Home, admitted with diabetes and accelerated blood pressure, still has uncontrolled diabetes, which precludes a safe discharge. Discharge to home, when clinically stable.  Subjective: No significant events overnight, patient was sleepy and dozing off, seems that patient is having sleep apnea.  Patient was unable to offer any complaints, seemed lethargic.  We will continue to monitor vital signs and continue current treatment as above. RN was advised to keep close eye on her and keep you posted.  We may have to do ABG, CPAP order placed to be used while patient is sleeping.   Physical Exam: General: NAD, lying comfortably Appear in no distress, affect appropriate Eyes: PERRLA ENT: Oral Mucosa Clear, moist  Neck: no JVD,  Cardiovascular: S1 and S2 Present, no Murmur,  Respiratory: good respiratory effort, Bilateral Air entry equal and Decreased, no Crackles, no wheezes Abdomen: Bowel Sound present, Soft and no tenderness,  Skin: no rashes Extremities: 4+ Pedal edema, no calf tenderness Neurologic: without any new focal findings Gait not checked due to patient safety concerns  Vitals:  05/03/22 2354 05/04/22 0444 05/04/22 0817 05/04/22 1015  BP: 133/86 (!) 160/82 (!) 157/80 (!) 96/55  Pulse: (!) 104 (!) 104 94 67  Resp: 18 18    Temp: 97.9 F (36.6 C) 98.8 F (37.1 C) 97.7 F  (36.5 C)   TempSrc: Oral Oral    SpO2: 98% 96% 92% 99%  Weight:  112.7 kg      Intake/Output Summary (Last 24 hours) at 05/04/2022 1412 Last data filed at 05/04/2022 1032 Gross per 24 hour  Intake 244.15 ml  Output 1250 ml  Net -1005.85 ml   Filed Weights   05/01/22 1800 05/03/22 0339 05/04/22 0444  Weight: 108.9 kg 112.3 kg 112.7 kg    Data Reviewed: I have personally reviewed and interpreted daily labs, tele strips, imagings as discussed above. I reviewed all nursing notes, pharmacy notes, vitals, pertinent old records I have discussed plan of care as described above with RN and patient/family.  CBC: Recent Labs  Lab 05/01/22 1518 05/02/22 0553 05/03/22 0514 05/04/22 0556  WBC 9.7 9.4 11.4* 11.0*  NEUTROABS  --  5.3  --   --   HGB 13.0 13.3 12.3 13.2  HCT 43.0 43.3 39.3 44.0  MCV 81.1 80.9 79.9* 82.9  PLT 225 222 210 241   Basic Metabolic Panel: Recent Labs  Lab 05/01/22 1518 05/02/22 0553 05/03/22 0514 05/04/22 0556  NA 127* 128* 129* 132*  K 5.1 3.7 4.1 4.1  CL 91* 93* 92* 95*  CO2 27 28 32 30  GLUCOSE 646* 358* 436* 396*  BUN 11 17 25* 32*  CREATININE 0.80 0.80 0.93 0.95  CALCIUM 8.0* 8.2* 8.2* 8.2*  MG  --  1.9 1.9 1.9  PHOS  --   --  4.0 4.1    Studies: No results found.  Scheduled Meds:  aspirin EC  81 mg Oral Daily   atorvastatin  10 mg Oral Daily   buprenorphine-naloxone  1 tablet Sublingual TID   enoxaparin (LOVENOX) injection  0.5 mg/kg Subcutaneous Q24H   hydrALAZINE  10 mg Intravenous Once   insulin aspart  0-20 Units Subcutaneous TID WC   insulin aspart  15 Units Subcutaneous TID WC   insulin glargine-yfgn  50 Units Subcutaneous BID   irbesartan  75 mg Oral Daily   metoprolol tartrate  75 mg Oral BID   sodium chloride flush  3 mL Intravenous Q12H   Continuous Infusions:  sodium chloride     furosemide (LASIX) 200 mg in dextrose 5 % 100 mL (2 mg/mL) infusion 4 mg/hr (05/04/22 1025)   PRN Meds: sodium chloride, acetaminophen,  fluticasone, [DISCONTINUED] hydrALAZINE **OR** hydrALAZINE, hydrOXYzine, ondansetron (ZOFRAN) IV, sodium chloride flush  Time spent: 50 minutes  Author: Gillis Santa. MD [facility] 05/04/2022 2:12 PM  To reach On-call, see care teams to locate the attending and reach out to them via [email or web address]. If 7PM-7AM, please contact night-coverage If you still have difficulty reaching the attending provider, please page the [facility] (Director on Call) for [facility] on amion for assistance.

## 2022-05-05 DIAGNOSIS — I5033 Acute on chronic diastolic (congestive) heart failure: Secondary | ICD-10-CM | POA: Diagnosis not present

## 2022-05-05 LAB — BASIC METABOLIC PANEL
Anion gap: 7 (ref 5–15)
BUN: 43 mg/dL — ABNORMAL HIGH (ref 6–20)
CO2: 29 mmol/L (ref 22–32)
Calcium: 8 mg/dL — ABNORMAL LOW (ref 8.9–10.3)
Chloride: 94 mmol/L — ABNORMAL LOW (ref 98–111)
Creatinine, Ser: 1.03 mg/dL — ABNORMAL HIGH (ref 0.44–1.00)
GFR, Estimated: >60 mL/min (ref >60)
Glucose, Bld: 406 mg/dL — ABNORMAL HIGH (ref 70–99)
Potassium: 4.5 mmol/L (ref 3.5–5.1)
Sodium: 130 mmol/L — ABNORMAL LOW (ref 135–145)

## 2022-05-05 LAB — GLUCOSE, CAPILLARY
Glucose-Capillary: 408 mg/dL — ABNORMAL HIGH (ref 70–99)
Glucose-Capillary: 211 mg/dL — ABNORMAL HIGH (ref 70–99)
Glucose-Capillary: 141 mg/dL — ABNORMAL HIGH (ref 70–99)
Glucose-Capillary: 257 mg/dL — ABNORMAL HIGH (ref 70–99)

## 2022-05-05 LAB — CBC
HCT: 41.4 % (ref 36.0–46.0)
Hemoglobin: 12.5 g/dL (ref 12.0–15.0)
MCH: 25 pg — ABNORMAL LOW (ref 26.0–34.0)
MCHC: 30.2 g/dL (ref 30.0–36.0)
MCV: 82.8 fL (ref 80.0–100.0)
Platelets: 242 10*3/uL (ref 150–400)
RBC: 5 MIL/uL (ref 3.87–5.11)
RDW: 14.9 % (ref 11.5–15.5)
WBC: 12.7 10*3/uL — ABNORMAL HIGH (ref 4.0–10.5)
nRBC: 0 % (ref 0.0–0.2)

## 2022-05-05 LAB — MAGNESIUM: Magnesium: 1.9 mg/dL (ref 1.7–2.4)

## 2022-05-05 LAB — PHOSPHORUS: Phosphorus: 4.7 mg/dL — ABNORMAL HIGH (ref 2.5–4.6)

## 2022-05-05 MED ORDER — INSULIN ASPART 100 UNIT/ML IJ SOLN
20.0000 [IU] | Freq: Three times a day (TID) | INTRAMUSCULAR | Status: DC
  Administered 2022-05-05 – 2022-05-06 (×4): 20 [IU] via SUBCUTANEOUS
  Filled 2022-05-05 (×4): qty 1

## 2022-05-05 MED ORDER — ACETAMINOPHEN 325 MG PO TABS
650.0000 mg | ORAL_TABLET | Freq: Three times a day (TID) | ORAL | Status: DC
  Administered 2022-05-05 – 2022-05-20 (×37): 650 mg via ORAL
  Filled 2022-05-05 (×38): qty 2

## 2022-05-05 MED ORDER — INSULIN GLARGINE-YFGN 100 UNIT/ML ~~LOC~~ SOLN
60.0000 [IU] | Freq: Two times a day (BID) | SUBCUTANEOUS | Status: DC
  Administered 2022-05-05 (×2): 60 [IU] via SUBCUTANEOUS
  Filled 2022-05-05 (×3): qty 0.6

## 2022-05-05 MED ORDER — INSULIN ASPART 100 UNIT/ML IV SOLN
10.0000 [IU] | Freq: Once | INTRAVENOUS | Status: AC
  Administered 2022-05-05: 10 [IU] via INTRAVENOUS
  Filled 2022-05-05: qty 0.1

## 2022-05-05 MED ORDER — INSULIN ASPART 100 UNIT/ML IJ SOLN: 10.0000 [IU] | Freq: Once | INTRAMUSCULAR | Status: DC
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
## 2022-05-05 NOTE — Progress Notes (Signed)
[facility] Progress Note  Patient: Caitlyn Fuller    ZOX:[id number]  DOA: 05/01/2022     Date of Service: the patient was seen and examined on 05/05/2022  Chief Complaint  Patient presents with   Hyperglycemia   Brief hospital course: Caitlyn Fuller is a 47 y.o. female with medical history significant of HFpEF, insulin-dependent type 2 diabetes, hypertension, polysubstance abuse, chronic pain syndrome, morbid obesity, who presents to the ED with complaints of elevated blood sugar.  Patient is noncompliant with her medications   Assessment and Plan:  # Hyperglycemia, uncontrolled diabetes mellitus due to noncompliance IDDM T2 4/26 increased  Semglee 60 units twice daily 4/26 Increased NovoLog 20 units 3 times daily and sliding scale 4/26 NovoLog 10 units IV given due to high blood sugar Monitor CBG, continue diabetic diet Diabetic coordinator consulted for further management and counseling   # Anasarca, diastolic CHF patient has significant bilateral lower extremity edema S/p Lasix IV 40 daily 4/24 started Lasix IV infusion to gradually diurese   # Accelerated hypertension  Uncontrolled blood pressure, BP 197/66 on admission Continue irbesartan 75 mg p.o. daily home dose Use hydralazine p.o. or IV as needed as per parameters S/p Lasix 40 mg IV daily Monitor BP and titrate medications accordingly 4/24 started Lopressor 50 mg p.o. twice daily with holding parameters  # Chronic pain Patient endorses chronic pain for which she states she was previously on Percocet.  PDMP reviewed and last prescription for oxycodone was in November 2023. - Conservative management and recommend outpatient follow-up with PCP   # Polysubstance abuse - Continue home Suboxone  # Sore throat Patient states that she developed perioral erythema and lesions on her tongue after taking a new blood pressure medicine last night.  At this time, she continues to have a sore throat.  On exam,  she has some mild erythema, but no edema. - Supportive management  # Obesity Body mass index is 46.89 kg/m.  Interventions: Calorie restricted diet and daily exercise advised to lose body weight.  Lifestyle modification discussed.       Diet: Diabetic diet DVT Prophylaxis: Subcutaneous Lovenox   Advance goals of care discussion: Full code  Family Communication: family was not present at bedside, at the time of interview.  The pt provided permission to discuss medical plan with the family. Opportunity was given to ask question and all questions were answered satisfactorily.   Disposition:  Pt is from Home, admitted with diabetes and accelerated blood pressure, anasarca, still has uncontrolled diabetes, and on IV Lasix infusion, which precludes a safe discharge. Discharge to home, when clinically stable.  Subjective: No significant events overnight, patient was sitting at the bedside, stated that she is being sleeping a lot, feels weak and does not feel any improvement.  Still has significant edema in the bilateral lower extremities.  Fluid striction and diet restriction advised.  Physical Exam: General: NAD, lying comfortably Appear in no distress, affect appropriate Eyes: PERRLA ENT: Oral Mucosa Clear, moist  Neck: no JVD,  Cardiovascular: S1 and S2 Present, no Murmur,  Respiratory: good respiratory effort, Bilateral Air entry equal and Decreased, no Crackles, no wheezes Abdomen: Bowel Sound present, Soft and no tenderness,  Skin: no rashes Extremities: 4+ Pedal edema, no calf tenderness Neurologic: without any new focal findings Gait not checked due to patient safety concerns  Vitals:   05/04/22 2348 05/05/22 0555 05/05/22 0736 05/05/22 1209  BP: 130/79 130/85 (!) 141/94 112/72  Pulse: 91 81 79 71  Resp: 18 18 20 20   Temp: 98.3 F (36.8 C) 98.7 F (37.1 C) 97.9 F (36.6 C) 98.1 F (36.7 C)  TempSrc:   Oral Oral  SpO2: 96% 95% 99% 99%  Weight:  115.4 kg       Intake/Output Summary (Last 24 hours) at 05/05/2022 1304 Last data filed at 05/05/2022 1047 Gross per 24 hour  Intake 565.05 ml  Output --  Net 565.05 ml   Filed Weights   05/03/22 0339 05/04/22 0444 05/05/22 0555  Weight: 112.3 kg 112.7 kg 115.4 kg    Data Reviewed: I have personally reviewed and interpreted daily labs, tele strips, imagings as discussed above. I reviewed all nursing notes, pharmacy notes, vitals, pertinent old records I have discussed plan of care as described above with RN and patient/family.  CBC: Recent Labs  Lab 05/01/22 1518 05/02/22 0553 05/03/22 0514 05/04/22 0556 05/05/22 0418  WBC 9.7 9.4 11.4* 11.0* 12.7*  NEUTROABS  --  5.3  --   --   --   HGB 13.0 13.3 12.3 13.2 12.5  HCT 43.0 43.3 39.3 44.0 41.4  MCV 81.1 80.9 79.9* 82.9 82.8  PLT 225 222 210 241 242   Basic Metabolic Panel: Recent Labs  Lab 05/01/22 1518 05/02/22 0553 05/03/22 0514 05/04/22 0556 05/05/22 0418  NA 127* 128* 129* 132* 130*  K 5.1 3.7 4.1 4.1 4.5  CL 91* 93* 92* 95* 94*  CO2 27 28 32 30 29  GLUCOSE 646* 358* 436* 396* 406*  BUN 11 17 25* 32* 43*  CREATININE 0.80 0.80 0.93 0.95 1.03*  CALCIUM 8.0* 8.2* 8.2* 8.2* 8.0*  MG  --  1.9 1.9 1.9 1.9  PHOS  --   --  4.0 4.1 4.7*    Studies: No results found.  Scheduled Meds:  aspirin EC  81 mg Oral Daily   atorvastatin  10 mg Oral Daily   buprenorphine-naloxone  1 tablet Sublingual TID   enoxaparin (LOVENOX) injection  0.5 mg/kg Subcutaneous Q24H   insulin aspart  0-20 Units Subcutaneous TID WC   insulin aspart  20 Units Subcutaneous TID WC   insulin glargine-yfgn  60 Units Subcutaneous BID   irbesartan  75 mg Oral Daily   metoprolol tartrate  50 mg Oral BID   sodium chloride flush  3 mL Intravenous Q12H   Continuous Infusions:  sodium chloride     furosemide (LASIX) 200 mg in dextrose 5 % 100 mL (2 mg/mL) infusion 4 mg/hr (05/04/22 1025)   PRN Meds: sodium chloride, acetaminophen, fluticasone,  [DISCONTINUED] hydrALAZINE **OR** hydrALAZINE, hydrOXYzine, ondansetron (ZOFRAN) IV, sodium chloride flush  Time spent: 50 minutes  Author: Gillis Santa. MD [facility] 05/05/2022 1:04 PM  To reach On-call, see care teams to locate the attending and reach out to them via [email or web address]. If 7PM-7AM, please contact night-coverage If you still have difficulty reaching the attending provider, please page the [facility] (Director on Call) for [facility] on amion for assistance.

## 2022-05-05 NOTE — TOC Progression Note (Signed)
Transition of Care [facility]) - Progression Note    Patient Details  Name: Caitlyn Fuller MRN: [id number] Date of Birth: August 14, 1975  [facility] CM/SW Contact  Truddie Hidden, RN Phone Number: 05/05/2022, 3:12 PM  Clinical Narrative:     Admitted for: Heart failure  [facility]  [id number] Transportation: "I'll find somebody."  Current home health/prior home health/DME: none            Expected Discharge Plan and Services                                               Social Determinants of Health (SDOH) Interventions SDOH Screenings   Food Insecurity: No Food Insecurity (05/02/2022)  Housing: Low Risk  (05/02/2022)  Transportation Needs: No Transportation Needs (05/02/2022)  Utilities: Not At Risk (05/02/2022)  Tobacco Use: Medium Risk (05/02/2022)    Readmission Risk Interventions    11/28/2021   11:17 AM 11/21/2021   11:34 AM  Readmission Risk Prevention Plan  Transportation Screening Complete Complete  PCP or Specialist Appt within 3-5 Days Complete Complete  Social Work Consult for Recovery Care Planning/Counseling Complete Complete  Palliative Care Screening  Not Applicable  Medication Review [employer]) Complete Complete

## 2022-05-05 NOTE — Plan of Care (Signed)

## 2022-05-05 NOTE — Inpatient Diabetes Management (Signed)
Inpatient Diabetes Program Recommendations  AACE/ADA: New Consensus Statement on Inpatient Glycemic Control (2015)  Target Ranges:  Prepandial:   less than 140 mg/dL      Peak postprandial:   less than 180 mg/dL (1-2 hours)      Critically ill patients:  140 - 180 mg/dL   Lab Results  Component Value Date   GLUCAP 408 (H) 05/05/2022   HGBA1C 14.3 (H) 05/02/2022    Review of Glycemic Control  Latest Reference Range & Units 05/04/22 08:17 05/04/22 10:14 05/04/22 11:26 05/04/22 15:54 05/04/22 21:30 05/05/22 07:33  Glucose-Capillary 70 - 99 mg/dL 161 (H) 096 (H) 045 (H) 318 (H) 337 (H) 408 (H)  (H): Data is abnormally high  Diabetes history: DM2 Outpatient Diabetes medications: Levemir 50 units QD, Novolog 0-20 TID and Novolog 20 units TID Current orders for Inpatient glycemic control: Semglee 60 units BID (oncreased today), Novolog 0-20 units TID and Novolog 20 units TID (increased today)  Inpatient Diabetes Program Recommendations:    Please consider: Novolog 0-20 units Q4H  Novolog 25 units TID with meals  She would likely benefit from U-500 outpatient.  Suggest she follow up with her PCP.    Will continue to follow while inpatient.  Thank you, Dulce Sellar, MSN, CDCES Diabetes Coordinator Inpatient Diabetes Program [phone] (team pager from 8a-5p)

## 2022-05-05 NOTE — Progress Notes (Signed)
Patient states that it is okay for significant other to come up to her room. Patient advised that security will most likely prevent significant other from returning to the floor after last night's incident. Patient states that if significant other is not allowed in her room, patient will have to leave the hospital. Patient advised of the medical necessity to remain hospitalized.

## 2022-05-06 ENCOUNTER — Inpatient Hospital Stay: Payer: Medicaid Other

## 2022-05-06 DIAGNOSIS — I5033 Acute on chronic diastolic (congestive) heart failure: Secondary | ICD-10-CM | POA: Diagnosis not present

## 2022-05-06 LAB — VITAMIN B12: Vitamin B-12: 205 pg/mL (ref 180–914)

## 2022-05-06 LAB — CBC
HCT: 36.4 % (ref 36.0–46.0)
Hemoglobin: 11.1 g/dL — ABNORMAL LOW (ref 12.0–15.0)
MCH: 24.7 pg — ABNORMAL LOW (ref 26.0–34.0)
MCHC: 30.5 g/dL (ref 30.0–36.0)
MCV: 81.1 fL (ref 80.0–100.0)
Platelets: 212 10*3/uL (ref 150–400)
RBC: 4.49 MIL/uL (ref 3.87–5.11)
RDW: 14.7 % (ref 11.5–15.5)
WBC: 11.8 10*3/uL — ABNORMAL HIGH (ref 4.0–10.5)
nRBC: 0 % (ref 0.0–0.2)

## 2022-05-06 LAB — GLUCOSE, CAPILLARY
Glucose-Capillary: 385 mg/dL — ABNORMAL HIGH (ref 70–99)
Glucose-Capillary: 190 mg/dL — ABNORMAL HIGH (ref 70–99)
Glucose-Capillary: 303 mg/dL — ABNORMAL HIGH (ref 70–99)
Glucose-Capillary: 318 mg/dL — ABNORMAL HIGH (ref 70–99)

## 2022-05-06 LAB — BASIC METABOLIC PANEL
Anion gap: 6 (ref 5–15)
BUN: 50 mg/dL — ABNORMAL HIGH (ref 6–20)
CO2: 28 mmol/L (ref 22–32)
Calcium: 8 mg/dL — ABNORMAL LOW (ref 8.9–10.3)
Chloride: 94 mmol/L — ABNORMAL LOW (ref 98–111)
Creatinine, Ser: 1.07 mg/dL — ABNORMAL HIGH (ref 0.44–1.00)
GFR, Estimated: >60 mL/min (ref >60)
Glucose, Bld: 471 mg/dL — ABNORMAL HIGH (ref 70–99)
Potassium: 4.7 mmol/L (ref 3.5–5.1)
Sodium: 128 mmol/L — ABNORMAL LOW (ref 135–145)

## 2022-05-06 LAB — D-DIMER, QUANTITATIVE: D-Dimer, Quant: 0.45 ug/mL-FEU (ref 0.00–0.50)

## 2022-05-06 MED ORDER — VITAMIN D (ERGOCALCIFEROL) 1.25 MG (50000 UNIT) PO CAPS
50000.0000 [IU] | ORAL_CAPSULE | ORAL | Status: DC
  Administered 2022-05-06 – 2022-05-13 (×2): 50000 [IU] via ORAL
  Filled 2022-05-06 (×3): qty 1

## 2022-05-06 MED ORDER — INSULIN ASPART 100 UNIT/ML IJ SOLN
0.0000 [IU] | Freq: Three times a day (TID) | INTRAMUSCULAR | Status: DC
  Administered 2022-05-06 (×2): 15 [IU] via SUBCUTANEOUS
  Administered 2022-05-07: 7 [IU] via SUBCUTANEOUS
  Administered 2022-05-07 (×2): 3 [IU] via SUBCUTANEOUS
  Administered 2022-05-07: 11 [IU] via SUBCUTANEOUS
  Administered 2022-05-08 – 2022-05-09 (×4): 7 [IU] via SUBCUTANEOUS
  Administered 2022-05-10: 11 [IU] via SUBCUTANEOUS
  Administered 2022-05-10 – 2022-05-12 (×4): 7 [IU] via SUBCUTANEOUS
  Administered 2022-05-12 – 2022-05-13 (×2): 4 [IU] via SUBCUTANEOUS
  Administered 2022-05-13: 3 [IU] via SUBCUTANEOUS
  Administered 2022-05-13: 7 [IU] via SUBCUTANEOUS
  Administered 2022-05-14: 11 [IU] via SUBCUTANEOUS
  Administered 2022-05-14: 7 [IU] via SUBCUTANEOUS
  Administered 2022-05-14 – 2022-05-15 (×2): 4 [IU] via SUBCUTANEOUS
  Administered 2022-05-15: 3 [IU] via SUBCUTANEOUS
  Administered 2022-05-15: 7 [IU] via SUBCUTANEOUS
  Administered 2022-05-15: 11 [IU] via SUBCUTANEOUS
  Administered 2022-05-16: 7 [IU] via SUBCUTANEOUS
  Administered 2022-05-16: 15 [IU] via SUBCUTANEOUS
  Administered 2022-05-16: 7 [IU] via SUBCUTANEOUS
  Administered 2022-05-17: 20 [IU] via SUBCUTANEOUS
  Administered 2022-05-17 (×2): 4 [IU] via SUBCUTANEOUS
  Administered 2022-05-18: 7 [IU] via SUBCUTANEOUS
  Administered 2022-05-18: 3 [IU] via SUBCUTANEOUS
  Administered 2022-05-19: 7 [IU] via SUBCUTANEOUS
  Administered 2022-05-19: 11 [IU] via SUBCUTANEOUS
  Administered 2022-05-19: 7 [IU] via SUBCUTANEOUS
  Administered 2022-05-20: 11 [IU] via SUBCUTANEOUS
  Filled 2022-05-06 (×38): qty 1

## 2022-05-06 MED ORDER — INSULIN ASPART 100 UNIT/ML IJ SOLN
25.0000 [IU] | Freq: Three times a day (TID) | INTRAMUSCULAR | Status: DC
  Administered 2022-05-06 – 2022-05-14 (×14): 25 [IU] via SUBCUTANEOUS
  Filled 2022-05-06 (×17): qty 1

## 2022-05-06 MED ORDER — INSULIN GLARGINE-YFGN 100 UNIT/ML ~~LOC~~ SOLN
65.0000 [IU] | Freq: Two times a day (BID) | SUBCUTANEOUS | Status: DC
  Administered 2022-05-06 – 2022-05-07 (×3): 65 [IU] via SUBCUTANEOUS
  Filled 2022-05-06 (×4): qty 0.65

## 2022-05-06 NOTE — Inpatient Diabetes Management (Signed)
Inpatient Diabetes Program Recommendations  AACE/ADA: New Consensus Statement on Inpatient Glycemic Control   Target Ranges:  Prepandial:   less than 140 mg/dL      Peak postprandial:   less than 180 mg/dL (1-2 hours)      Critically ill patients:  140 - 180 mg/dL    Latest Reference Range & Units 05/05/22 07:33 05/05/22 12:06 05/05/22 16:03 05/05/22 21:14 05/06/22 07:48  Glucose-Capillary 70 - 99 mg/dL 295 (H)  Novolog 50 units  Semglee 60 units 211 (H)  Novolog 27 units 141 (H)  Novolog 20 units 257 (H)     Semglee 60 units 385 (H)  (H): Data is abnormally high  Review of Glycemic Control  Diabetes history: DM2 Outpatient Diabetes medications: Levemir 50 units QD, Novolog 0-20 TID and Novolog 20 units TID Current orders for Inpatient glycemic control: Semglee 60 units BID, Novolog 0-20 units TID with meals, Novolog 20 units TID with meals   Inpatient Diabetes Program Recommendations:    Insulin: CBG 257 mg/dl at bedtime last night (no correction since none ordered) and fasting CBG 385 mg/dl this morning. Please consider increasing Semglee to 65 units BID, changing Novolog 0-20 units to AC&HS (to add HS coverage), and meal coverage to Novolog 25 units TID with meals.  NOTE: Noted consult for Diabetes Coordinator. Diabetes Coordinator is not on campus over the weekend but available by pager from 8am to 5pm for questions or concerns.   Thanks, Orlando Penner, RN, MSN, CDCES Diabetes Coordinator Inpatient Diabetes Program [phone] (Team Pager from 8am to 5pm)

## 2022-05-06 NOTE — Plan of Care (Signed)

## 2022-05-06 NOTE — Progress Notes (Signed)
[facility] Progress Note  Patient: Caitlyn Fuller    ZOX:[id number]  DOA: 05/01/2022     Date of Service: the patient was seen and examined on 05/06/2022  Chief Complaint  Patient presents with   Hyperglycemia   Brief hospital course: Caitlyn Fuller is a 47 y.o. female with medical history significant of HFpEF, insulin-dependent type 2 diabetes, hypertension, polysubstance abuse, chronic pain syndrome, morbid obesity, who presents to the ED with complaints of elevated blood sugar.  Patient is noncompliant with her medications   Assessment and Plan:  # Hyperglycemia, uncontrolled diabetes mellitus due to noncompliance IDDM T2 4/27 increased  Semglee 65 units twice daily 4/27 Increased NovoLog 25 units 3 times daily and sliding scale 4/26 NovoLog 10 units IV given due to high blood sugar Monitor CBG, continue diabetic diet Diabetic coordinator consulted for further management and counseling   # Anasarca, diastolic CHF patient has significant bilateral lower extremity edema S/p Lasix IV 40 daily 4/24 started Lasix IV infusion to gradually diurese D-dimer negative Follow-up venous duplex to rule out DVT  # Accelerated hypertension  Uncontrolled blood pressure, BP 197/66 on admission Continue irbesartan 75 mg p.o. daily home dose Use hydralazine p.o. or IV as needed as per parameters S/p Lasix 40 mg IV daily Monitor BP and titrate medications accordingly 4/24 started Lopressor 50 mg p.o. twice daily with holding parameters  # Chronic pain Patient endorses chronic pain for which she states she was previously on Percocet.  PDMP reviewed and last prescription for oxycodone was in November 2023. - Conservative management and recommend outpatient follow-up with PCP   # Polysubstance abuse - Continue home Suboxone  # Sore throat Patient states that she developed perioral erythema and lesions on her tongue after taking a new blood pressure medicine last night.  At  this time, she continues to have a sore throat.  On exam, she has some mild erythema, but no edema. - Supportive management  # Obesity Body mass index is 46.89 kg/m.  Interventions: Calorie restricted diet and daily exercise advised to lose body weight.  Lifestyle modification discussed.   # Vitamin D deficiency: started vitamin D 50,000 units p.o. weekly, follow with PCP to repeat vitamin D level after 3 to 6 months.     Diet: Diabetic diet DVT Prophylaxis: Subcutaneous Lovenox   Advance goals of care discussion: Full code  Family Communication: family was not present at bedside, at the time of interview.  The pt provided permission to discuss medical plan with the family. Opportunity was given to ask question and all questions were answered satisfactorily.   Disposition:  Pt is from Home, admitted with diabetes and accelerated blood pressure, anasarca, still has uncontrolled diabetes, and on IV Lasix infusion, which precludes a safe discharge. Discharge to home, when clinically stable.  Subjective: No significant events overnight, patient is presently feeling tired and lethargic and sleepy, denies any specific complaints.  Still has significant edema in the bilateral lower extremities does not feel improvement.  Patient stated that she is compliant with fluid restriction.   Physical Exam: General: NAD, lying comfortably Appear in no distress, affect appropriate Eyes: PERRLA ENT: Oral Mucosa Clear, moist  Neck: no JVD,  Cardiovascular: S1 and S2 Present, no Murmur,  Respiratory: good respiratory effort, Bilateral Air entry equal and Decreased, no Crackles, no wheezes Abdomen: Bowel Sound present, Soft and no tenderness,  Skin: no rashes Extremities: 4+ Pedal edema, no calf tenderness Neurologic: without any new focal findings Gait not checked  due to patient safety concerns  Vitals:   05/05/22 2359 05/06/22 0413 05/06/22 0746 05/06/22 1218  BP: (!) 96/54 123/84 131/70 115/72   Pulse: 67 71 73 83  Resp: 18 20 20 20   Temp: 98 F (36.7 C) 98.5 F (36.9 C) 98.4 F (36.9 C) 98.7 F (37.1 C)  TempSrc:  Oral    SpO2: 95% 97% 97% 98%  Weight:  117.1 kg      Intake/Output Summary (Last 24 hours) at 05/06/2022 1445 Last data filed at 05/06/2022 1430 Gross per 24 hour  Intake 1264.97 ml  Output --  Net 1264.97 ml   Filed Weights   05/04/22 0444 05/05/22 0555 05/06/22 0413  Weight: 112.7 kg 115.4 kg 117.1 kg    Data Reviewed: I have personally reviewed and interpreted daily labs, tele strips, imagings as discussed above. I reviewed all nursing notes, pharmacy notes, vitals, pertinent old records I have discussed plan of care as described above with RN and patient/family.  CBC: Recent Labs  Lab 05/02/22 0553 05/03/22 0514 05/04/22 0556 05/05/22 0418 05/06/22 0323  WBC 9.4 11.4* 11.0* 12.7* 11.8*  NEUTROABS 5.3  --   --   --   --   HGB 13.3 12.3 13.2 12.5 11.1*  HCT 43.3 39.3 44.0 41.4 36.4  MCV 80.9 79.9* 82.9 82.8 81.1  PLT 222 210 241 242 212   Basic Metabolic Panel: Recent Labs  Lab 05/02/22 0553 05/03/22 0514 05/04/22 0556 05/05/22 0418 05/06/22 0323  NA 128* 129* 132* 130* 128*  K 3.7 4.1 4.1 4.5 4.7  CL 93* 92* 95* 94* 94*  CO2 28 32 30 29 28   GLUCOSE 358* 436* 396* 406* 471*  BUN 17 25* 32* 43* 50*  CREATININE 0.80 0.93 0.95 1.03* 1.07*  CALCIUM 8.2* 8.2* 8.2* 8.0* 8.0*  MG 1.9 1.9 1.9 1.9  --   PHOS  --  4.0 4.1 4.7*  --     Studies: No results found.  Scheduled Meds:  acetaminophen  650 mg Oral Q8H   aspirin EC  81 mg Oral Daily   atorvastatin  10 mg Oral Daily   buprenorphine-naloxone  1 tablet Sublingual TID   enoxaparin (LOVENOX) injection  0.5 mg/kg Subcutaneous Q24H   insulin aspart  0-20 Units Subcutaneous TID AC & HS   insulin aspart  25 Units Subcutaneous TID WC   insulin glargine-yfgn  65 Units Subcutaneous BID   irbesartan  75 mg Oral Daily   metoprolol tartrate  50 mg Oral BID   sodium chloride flush  3 mL  Intravenous Q12H   Continuous Infusions:  sodium chloride     furosemide (LASIX) 200 mg in dextrose 5 % 100 mL (2 mg/mL) infusion 4 mg/hr (05/06/22 1430)   PRN Meds: sodium chloride, fluticasone, [DISCONTINUED] hydrALAZINE **OR** hydrALAZINE, hydrOXYzine, ondansetron (ZOFRAN) IV, sodium chloride flush  Time spent: 50 minutes  Author: Gillis Santa. MD [facility] 05/06/2022 2:45 PM  To reach On-call, see care teams to locate the attending and reach out to them via [email or web address]. If 7PM-7AM, please contact night-coverage If you still have difficulty reaching the attending provider, please page the [facility] (Director on Call) for [facility] on amion for assistance.

## 2022-05-07 ENCOUNTER — Inpatient Hospital Stay: Payer: Medicaid Other

## 2022-05-07 ENCOUNTER — Other Ambulatory Visit: Payer: Medicaid Other

## 2022-05-07 DIAGNOSIS — I5033 Acute on chronic diastolic (congestive) heart failure: Secondary | ICD-10-CM | POA: Diagnosis not present

## 2022-05-07 LAB — BASIC METABOLIC PANEL
Anion gap: 8 (ref 5–15)
BUN: 50 mg/dL — ABNORMAL HIGH (ref 6–20)
CO2: 28 mmol/L (ref 22–32)
Calcium: 8.3 mg/dL — ABNORMAL LOW (ref 8.9–10.3)
Chloride: 93 mmol/L — ABNORMAL LOW (ref 98–111)
Creatinine, Ser: 0.98 mg/dL (ref 0.44–1.00)
GFR, Estimated: >60 mL/min (ref >60)
Glucose, Bld: 352 mg/dL — ABNORMAL HIGH (ref 70–99)
Potassium: 4.6 mmol/L (ref 3.5–5.1)
Sodium: 129 mmol/L — ABNORMAL LOW (ref 135–145)

## 2022-05-07 LAB — GLUCOSE, CAPILLARY
Glucose-Capillary: 261 mg/dL — ABNORMAL HIGH (ref 70–99)
Glucose-Capillary: 222 mg/dL — ABNORMAL HIGH (ref 70–99)
Glucose-Capillary: 139 mg/dL — ABNORMAL HIGH (ref 70–99)
Glucose-Capillary: 158 mg/dL — ABNORMAL HIGH (ref 70–99)

## 2022-05-07 LAB — CBC
HCT: 37.4 % (ref 36.0–46.0)
Hemoglobin: 11.3 g/dL — ABNORMAL LOW (ref 12.0–15.0)
MCH: 24.6 pg — ABNORMAL LOW (ref 26.0–34.0)
MCHC: 30.2 g/dL (ref 30.0–36.0)
MCV: 81.3 fL (ref 80.0–100.0)
Platelets: 212 10*3/uL (ref 150–400)
RBC: 4.6 MIL/uL (ref 3.87–5.11)
RDW: 14.6 % (ref 11.5–15.5)
WBC: 10.9 10*3/uL — ABNORMAL HIGH (ref 4.0–10.5)
nRBC: 0 % (ref 0.0–0.2)

## 2022-05-07 LAB — PHOSPHORUS: Phosphorus: 5.2 mg/dL — ABNORMAL HIGH (ref 2.5–4.6)

## 2022-05-07 LAB — MAGNESIUM: Magnesium: 2 mg/dL (ref 1.7–2.4)

## 2022-05-07 MED ORDER — VITAMIN B-12 1000 MCG PO TABS
1000.0000 ug | ORAL_TABLET | Freq: Every day | ORAL | Status: DC
  Administered 2022-05-15 – 2022-05-20 (×6): 1000 ug via ORAL
  Filled 2022-05-07 (×7): qty 1

## 2022-05-07 MED ORDER — DICLOFENAC SODIUM 1 % EX GEL
2.0000 g | Freq: Two times a day (BID) | CUTANEOUS | Status: DC | PRN
  Administered 2022-05-17 – 2022-05-19 (×3): 2 g via TOPICAL
  Filled 2022-05-07: qty 100

## 2022-05-07 MED ORDER — CARVEDILOL 6.25 MG PO TABS
6.2500 mg | ORAL_TABLET | Freq: Two times a day (BID) | ORAL | Status: DC
  Filled 2022-05-07: qty 1

## 2022-05-07 MED ORDER — CYANOCOBALAMIN 1000 MCG/ML IJ SOLN
1000.0000 ug | Freq: Every day | INTRAMUSCULAR | Status: AC
  Administered 2022-05-07 – 2022-05-08 (×2): 1000 ug via INTRAMUSCULAR
  Filled 2022-05-07 (×7): qty 1

## 2022-05-07 MED ORDER — ACETAMINOPHEN 325 MG PO TABS
650.0000 mg | ORAL_TABLET | Freq: Four times a day (QID) | ORAL | Status: DC | PRN
  Administered 2022-05-16 – 2022-05-17 (×3): 650 mg via ORAL
  Filled 2022-05-07 (×3): qty 2

## 2022-05-07 MED ORDER — INSULIN GLARGINE-YFGN 100 UNIT/ML ~~LOC~~ SOLN
75.0000 [IU] | Freq: Two times a day (BID) | SUBCUTANEOUS | Status: DC
  Administered 2022-05-07 – 2022-05-12 (×9): 75 [IU] via SUBCUTANEOUS
  Filled 2022-05-07 (×11): qty 0.75

## 2022-05-07 NOTE — Inpatient Diabetes Management (Signed)
Inpatient Diabetes Program Recommendations  AACE/ADA: New Consensus Statement on Inpatient Glycemic Control   Target Ranges:  Prepandial:   less than 140 mg/dL      Peak postprandial:   less than 180 mg/dL (1-2 hours)      Critically ill patients:  140 - 180 mg/dL    Latest Reference Range & Units 05/07/22 04:42  Glucose 70 - 99 mg/dL 161 (H)    Latest Reference Range & Units 05/06/22 07:48 05/06/22 12:25 05/06/22 16:12 05/06/22 20:23  Glucose-Capillary 70 - 99 mg/dL 096 (H) 045 (H) 409 (H) 318 (H)   Review of Glycemic Control  Diabetes history: DM2 Outpatient Diabetes medications: Levemir 50 units QD, Novolog 0-20 TID and Novolog 20 units TID Current orders for Inpatient glycemic control: Semglee 65 units BID, Novolog 0-20 units AC&HS, Novolog 25 units TID with meals   Inpatient Diabetes Program Recommendations:    Insulin: Please consider increasing Semglee to 75 units BID.  NOTE: Noted consult for Diabetes Coordinator. Diabetes Coordinator is not on campus over the weekend but available by pager from 8am to 5pm for questions or concerns. Noted CBG 190 mg/dl at 81:19 on 1/47 and patient refused 12:00 pm Novolog correction and meal coverage (per [facility]).   Thanks, Orlando Penner, RN, MSN, CDCES Diabetes Coordinator Inpatient Diabetes Program [phone] (Team Pager from 8am to 5pm)

## 2022-05-07 NOTE — Progress Notes (Signed)
[facility] Progress Note  Patient: Caitlyn Fuller    WUJ:[id number]  DOA: 05/01/2022     Date of Service: the patient was seen and examined on 05/07/2022  Chief Complaint  Patient presents with   Hyperglycemia   Brief hospital course: LUANA TATRO is a 47 y.o. female with medical history significant of HFpEF, insulin-dependent type 2 diabetes, hypertension, polysubstance abuse, chronic pain syndrome, morbid obesity, who presents to the ED with complaints of elevated blood sugar.  Patient is noncompliant with her medications   Assessment and Plan:  # Hyperglycemia, uncontrolled diabetes mellitus due to noncompliance IDDM T2 4/28 increased  Semglee 75 units twice daily 4/27 Increased NovoLog 25 units 3 times daily and sliding scale 4/26 NovoLog 10 units IV given due to high blood sugar Monitor CBG, continue diabetic diet Diabetic coordinator consulted for further management and counseling   # Anasarca, diastolic CHF patient has significant bilateral lower extremity edema S/p Lasix IV 40 daily 4/24 started Lasix 4 mg/hr  IV infusion  4/28 increased Lasix 8 mg/hr IV infusion due to no improvement in lower extremity edema D-dimer negative venous duplex negative for DVT US renal: The kidneys are unremarkable. A large postvoid residual of 108 cc is identified. The bladder is otherwise unremarkable. Monitor intake output and renal functions, continue fluid restriction 1.5 L/day  # Accelerated hypertension  Uncontrolled blood pressure, BP 197/66 on admission Continue irbesartan 75 mg p.o. daily home dose Use hydralazine p.o. or IV as needed as per parameters S/p Lasix 40 mg IV daily Monitor BP and titrate medications accordingly 4/24 --4/28 s/p Lopressor 50 mg p.o. BID, discontinued, patient was feeling dizziness after taking medication.   4/28 started Coreg 6.25 mg p.o. twice daily   # Chronic pain Patient endorses chronic pain for which she states she was  previously on Percocet.  PDMP reviewed and last prescription for oxycodone was in November 2023. - Conservative management and recommend outpatient follow-up with PCP 4/28 patient stated no allergy to Tylenol, so started Tylenol 650 mg p.o. every 6 hourly as needed  # Polysubstance abuse - Continue home Suboxone  # Sore throat Patient states that she developed perioral erythema and lesions on her tongue after taking a new blood pressure medicine last night.  At this time, she continues to have a sore throat.  On exam, she has some mild erythema, but no edema. - Supportive management  # Obesity Body mass index is 46.89 kg/m.  Interventions: Calorie restricted diet and daily exercise advised to lose body weight.  Lifestyle modification discussed.   # Vitamin D deficiency: started vitamin D 50,000 units p.o. weekly, follow with PCP to repeat vitamin D level after 3 to 6 months. #Vitamin B12 level 205 at lower end, goal >400, started vitamin B12 1000 mcg IM injection daily for 7 days during hospital stay followed by oral supplement at discharge.  Follow with PCP to repeat B12 level after 3 to 6 months.     Diet: Diabetic diet DVT Prophylaxis: Subcutaneous Lovenox   Advance goals of care discussion: Full code  Family Communication: family was not present at bedside, at the time of interview.  The pt provided permission to discuss medical plan with the family. Opportunity was given to ask question and all questions were answered satisfactorily.   Disposition:  Pt is from Home, admitted with diabetes and accelerated blood pressure, anasarca, still has uncontrolled diabetes, and on IV Lasix infusion, which precludes a safe discharge. Discharge to home, when clinically  stable.  Subjective: No significant events overnight, patient was sitting on the edge of the bed, feeling dizzy after taking blood pressure medication, still feels no improvement in the lower extremity edema, patient is voiding  well.  Denies any other complaints.   Physical Exam: General: NAD, lying comfortably Appear in no distress, affect appropriate Eyes: PERRLA ENT: Oral Mucosa Clear, moist  Neck: no JVD,  Cardiovascular: S1 and S2 Present, no Murmur,  Respiratory: good respiratory effort, Bilateral Air entry equal and Decreased, no Crackles, no wheezes Abdomen: Bowel Sound present, Soft and no tenderness,  Skin: no rashes Extremities: 4+ Pedal edema, no calf tenderness Neurologic: without any new focal findings Gait not checked due to patient safety concerns  Vitals:   05/06/22 2025 05/07/22 0500 05/07/22 0832 05/07/22 1346  BP: (!) 156/79  116/63 139/81  Pulse: 84  73 84  Resp: 20  18 16   Temp: 98.7 F (37.1 C)  97.7 F (36.5 C) 98 F (36.7 C)  TempSrc: Oral     SpO2: 98%  100% 92%  Weight:  117.9 kg      Intake/Output Summary (Last 24 hours) at 05/07/2022 1402 Last data filed at 05/07/2022 1221 Gross per 24 hour  Intake 1247.52 ml  Output 800 ml  Net 447.52 ml   Filed Weights   05/05/22 0555 05/06/22 0413 05/07/22 0500  Weight: 115.4 kg 117.1 kg 117.9 kg    Data Reviewed: I have personally reviewed and interpreted daily labs, tele strips, imagings as discussed above. I reviewed all nursing notes, pharmacy notes, vitals, pertinent old records I have discussed plan of care as described above with RN and patient/family.  CBC: Recent Labs  Lab 05/02/22 0553 05/03/22 0514 05/04/22 0556 05/05/22 0418 05/06/22 0323 05/07/22 0442  WBC 9.4 11.4* 11.0* 12.7* 11.8* 10.9*  NEUTROABS 5.3  --   --   --   --   --   HGB 13.3 12.3 13.2 12.5 11.1* 11.3*  HCT 43.3 39.3 44.0 41.4 36.4 37.4  MCV 80.9 79.9* 82.9 82.8 81.1 81.3  PLT 222 210 241 242 212 212   Basic Metabolic Panel: Recent Labs  Lab 05/02/22 0553 05/03/22 0514 05/04/22 0556 05/05/22 0418 05/06/22 0323 05/07/22 0442  NA 128* 129* 132* 130* 128* 129*  K 3.7 4.1 4.1 4.5 4.7 4.6  CL 93* 92* 95* 94* 94* 93*  CO2 28 32 30  29 28 28   GLUCOSE 358* 436* 396* 406* 471* 352*  BUN 17 25* 32* 43* 50* 50*  CREATININE 0.80 0.93 0.95 1.03* 1.07* 0.98  CALCIUM 8.2* 8.2* 8.2* 8.0* 8.0* 8.3*  MG 1.9 1.9 1.9 1.9  --  2.0  PHOS  --  4.0 4.1 4.7*  --  5.2*    Studies: US RENAL  Result Date: 05/07/2022 CLINICAL DATA:  Urinary retention. EXAM: RENAL / URINARY TRACT ULTRASOUND COMPLETE COMPARISON:  182.5 FINDINGS: Right Kidney: Renal measurements: 11.1 x 5.2 x 6 cm = volume: 182.5 mL. Echogenicity within normal limits. No mass or hydronephrosis visualized. Left Kidney: Renal measurements: 11 x 6 x 5.7 cm = volume: 199 mL. Echogenicity within normal limits. No mass or hydronephrosis visualized. Bladder: A large postvoid residual of 108 cc is identified. The bladder is otherwise unremarkable. Other: None. IMPRESSION: 1. The kidneys are unremarkable. 2. A large postvoid residual of 108 cc is identified. The bladder is otherwise unremarkable. Electronically Signed   By: Gerome Sam III M.D.   On: 05/07/2022 14:00   US Venous Img Lower Bilateral (  DVT)  Result Date: 05/06/2022 CLINICAL DATA:  9965 Edema 9965 EXAM: BILATERAL LOWER EXTREMITY VENOUS DOPPLER ULTRASOUND TECHNIQUE: Gray-scale sonography with graded compression, as well as color Doppler and duplex ultrasound were performed to evaluate the lower extremity deep venous systems from the level of the common femoral vein and including the common femoral, femoral, profunda femoral, popliteal and calf veins including the posterior tibial, peroneal and gastrocnemius veins when visible. The superficial great saphenous vein was also interrogated. Spectral Doppler was utilized to evaluate flow at rest and with distal augmentation maneuvers in the common femoral, femoral and popliteal veins. COMPARISON:  Lower extremity XRs, 03/20/2022.  Chest XR, 05/01/2022. FINDINGS: RIGHT LOWER EXTREMITY VENOUS Normal compressibility of the RIGHT common femoral, superficial femoral, and popliteal veins, as  well as the visualized calf veins. Visualized portions of profunda femoral vein and great saphenous vein unremarkable. No filling defects to suggest DVT on grayscale or color Doppler imaging. Doppler waveforms show normal direction of venous flow, normal respiratory plasticity and response to augmentation. OTHER No evidence of superficial thrombophlebitis or abnormal fluid collection. Limitations: none LEFT LOWER EXTREMITY VENOUS Normal compressibility of the LEFT common femoral, superficial femoral, and popliteal veins, as well as the visualized calf veins. Visualized portions of profunda femoral vein and great saphenous vein unremarkable. No filling defects to suggest DVT on grayscale or color Doppler imaging. Doppler waveforms show normal direction of venous flow, normal respiratory plasticity and response to augmentation. OTHER No evidence of superficial thrombophlebitis or abnormal fluid collection. Limitations: none IMPRESSION: No evidence of femoropopliteal DVT or superficial thrombophlebitis within either lower extremity. Roanna Banning, MD [facility] Electronically Signed   By: Roanna Banning M.D.   On: 05/06/2022 15:11    Scheduled Meds:  acetaminophen  650 mg Oral Q8H   aspirin EC  81 mg Oral Daily   atorvastatin  10 mg Oral Daily   buprenorphine-naloxone  1 tablet Sublingual TID   carvedilol  6.25 mg Oral BID WC   cyanocobalamin  1,000 mcg Intramuscular Daily   Followed by   Melene Muller ON 05/14/2022] vitamin B-12  1,000 mcg Oral Daily   enoxaparin (LOVENOX) injection  0.5 mg/kg Subcutaneous Q24H   insulin aspart  0-20 Units Subcutaneous TID AC & HS   insulin aspart  25 Units Subcutaneous TID WC   insulin glargine-yfgn  65 Units Subcutaneous BID   irbesartan  75 mg Oral Daily   sodium chloride flush  3 mL Intravenous Q12H   Vitamin D (Ergocalciferol)  50,000 Units Oral Q7 days   Continuous Infusions:  sodium chloride     furosemide  (LASIX) 200 mg in dextrose 5 % 100 mL (2 mg/mL) infusion 8 mg/hr (05/07/22 1221)   PRN Meds: sodium chloride, diclofenac Sodium, fluticasone, [DISCONTINUED] hydrALAZINE **OR** hydrALAZINE, hydrOXYzine, ondansetron (ZOFRAN) IV, sodium chloride flush  Time spent: 50 minutes  Author: Gillis Santa. MD [facility] 05/07/2022 2:02 PM  To reach On-call, see care teams to locate the attending and reach out to them via [email or web address]. If 7PM-7AM, please contact night-coverage If you still have difficulty reaching the attending provider, please page the [facility] (Director on Call) for [facility] on amion for assistance.

## 2022-05-08 ENCOUNTER — Inpatient Hospital Stay (HOSPITAL_COMMUNITY)
Admit: 2022-05-08 | Discharge: 2022-05-08 | Disposition: A | Discharge status: Home / Self Care | Payer: Medicaid Other | Attending: Student | Admitting: Student

## 2022-05-08 DIAGNOSIS — I5023 Acute on chronic systolic (congestive) heart failure: Secondary | ICD-10-CM

## 2022-05-08 DIAGNOSIS — I5033 Acute on chronic diastolic (congestive) heart failure: Secondary | ICD-10-CM | POA: Diagnosis not present

## 2022-05-08 LAB — CBC
HCT: 37.8 % (ref 36.0–46.0)
Hemoglobin: 11.5 g/dL — ABNORMAL LOW (ref 12.0–15.0)
MCH: 24.6 pg — ABNORMAL LOW (ref 26.0–34.0)
MCHC: 30.4 g/dL (ref 30.0–36.0)
MCV: 80.9 fL (ref 80.0–100.0)
Platelets: 227 10*3/uL (ref 150–400)
RBC: 4.67 MIL/uL (ref 3.87–5.11)
RDW: 15 % (ref 11.5–15.5)
WBC: 11.2 10*3/uL — ABNORMAL HIGH (ref 4.0–10.5)
nRBC: 0 % (ref 0.0–0.2)

## 2022-05-08 LAB — ECHOCARDIOGRAM COMPLETE
AR max vel: 2.08 cm2
AV Area VTI: 1.98 cm2
AV Area mean vel: 1.94 cm2
AV Mean grad: 7 mmHg
AV Peak grad: 11.7 mmHg
Ao pk vel: 1.71 m/s
Area-P 1/2: 3.4 cm2
MV VTI: 2.1 cm2
S' Lateral: 2.8 cm
Weight: 4098.79 oz

## 2022-05-08 LAB — BASIC METABOLIC PANEL
Anion gap: 11 (ref 5–15)
BUN: 49 mg/dL — ABNORMAL HIGH (ref 6–20)
CO2: 32 mmol/L (ref 22–32)
Calcium: 8.4 mg/dL — ABNORMAL LOW (ref 8.9–10.3)
Chloride: 94 mmol/L — ABNORMAL LOW (ref 98–111)
Creatinine, Ser: 0.98 mg/dL (ref 0.44–1.00)
GFR, Estimated: >60 mL/min (ref >60)
Glucose, Bld: 202 mg/dL — ABNORMAL HIGH (ref 70–99)
Potassium: 3.7 mmol/L (ref 3.5–5.1)
Sodium: 135 mmol/L (ref 135–145)

## 2022-05-08 LAB — GLUCOSE, CAPILLARY
Glucose-Capillary: 214 mg/dL — ABNORMAL HIGH (ref 70–99)
Glucose-Capillary: 143 mg/dL — ABNORMAL HIGH (ref 70–99)
Glucose-Capillary: 164 mg/dL — ABNORMAL HIGH (ref 70–99)
Glucose-Capillary: 227 mg/dL — ABNORMAL HIGH (ref 70–99)
Glucose-Capillary: 312 mg/dL — ABNORMAL HIGH (ref 70–99)

## 2022-05-08 MED ORDER — CARVEDILOL 3.125 MG PO TABS: 3.1250 mg | ORAL_TABLET | Freq: Two times a day (BID) | ORAL | Status: DC

## 2022-05-08 MED ORDER — OXYCODONE HCL 5 MG PO TABS
5.0000 mg | ORAL_TABLET | Freq: Four times a day (QID) | ORAL | Status: DC | PRN
  Administered 2022-05-08 – 2022-05-12 (×11): 5 mg via ORAL
  Filled 2022-05-08 (×11): qty 1

## 2022-05-08 NOTE — Progress Notes (Signed)
*  PRELIMINARY RESULTS* Echocardiogram 2D Echocardiogram has been performed.  Carolyne Fiscal 05/08/2022, 11:11 AM

## 2022-05-08 NOTE — Progress Notes (Signed)
[facility] Progress Note  Patient: Caitlyn Fuller    WNU:[id number]  DOA: 05/01/2022     Date of Service: the patient was seen and examined on 05/08/2022  Chief Complaint  Patient presents with   Hyperglycemia   Brief hospital course: Caitlyn Fuller is a 47 y.o. female with medical history significant of HFpEF, insulin-dependent type 2 diabetes, hypertension, polysubstance abuse, chronic pain syndrome, morbid obesity, who presents to the ED with complaints of elevated blood sugar.  Patient is noncompliant with her medications   Assessment and Plan:  # Hyperglycemia, uncontrolled diabetes mellitus due to noncompliance IDDM T2 4/28 increased  Semglee 75 units twice daily 4/27 Increased NovoLog 25 units 3 times daily and sliding scale 4/26 NovoLog 10 units IV given due to high blood sugar Monitor CBG, continue diabetic diet Diabetic coordinator consulted for further management and counseling   # Anasarca, diastolic CHF patient has significant bilateral lower extremity edema S/p Lasix IV 40 daily 4/24 started Lasix 4 mg/hr  IV infusion  4/28 increased Lasix 8 mg/hr IV infusion due to no improvement in lower extremity edema D-dimer negative venous duplex negative for DVT US renal: The kidneys are unremarkable. A large postvoid residual of 108 cc is identified. The bladder is otherwise unremarkable. Monitor intake output and renal functions, continue fluid restriction 1.5 L/day  # Accelerated hypertension  Uncontrolled blood pressure, BP 197/66 on admission Continue irbesartan 75 mg p.o. daily home dose Use hydralazine p.o. or IV as needed as per parameters S/p Lasix 40 mg IV daily Monitor BP and titrate medications accordingly 4/24 --4/28 s/p Lopressor 50 mg p.o. BID, discontinued, patient was feeling dizziness after taking medication.   4/28 started Coreg 6.25 mg p.o. twice daily 4/29 blood pressures soft now, patient refused coreg last night, so  discontinued   # Chronic pain Patient endorses chronic pain for which she states she was previously on Percocet.  PDMP reviewed and last prescription for oxycodone was in November 2023. - Conservative management and recommend outpatient follow-up with PCP 4/28 patient stated no allergy to Tylenol, so started Tylenol 650 mg p.o. every 6 hourly as needed 4/29 started oxycodone 5 mg p.o. every 6 hours as needed  # Polysubstance abuse - Continue home Suboxone  # Sore throat Patient states that she developed perioral erythema and lesions on her tongue after taking a new blood pressure medicine last night.  At this time, she continues to have a sore throat.  On exam, she has some mild erythema, but no edema. - Supportive management  # Obesity Body mass index is 46.89 kg/m.  Interventions: Calorie restricted diet and daily exercise advised to lose body weight.  Lifestyle modification discussed.   # Vitamin D deficiency: started vitamin D 50,000 units p.o. weekly, follow with PCP to repeat vitamin D level after 3 to 6 months. #Vitamin B12 level 205 at lower end, goal >400, started vitamin B12 1000 mcg IM injection daily for 7 days during hospital stay followed by oral supplement at discharge.  Follow with PCP to repeat B12 level after 3 to 6 months.     Diet: Diabetic diet DVT Prophylaxis: Subcutaneous Lovenox   Advance goals of care discussion: Full code  Family Communication: family was not present at bedside, at the time of interview.  The pt provided permission to discuss medical plan with the family. Opportunity was given to ask question and all questions were answered satisfactorily.   Disposition:  Pt is from Home, admitted with diabetes and  accelerated blood pressure, anasarca, still has uncontrolled diabetes, and on IV Lasix infusion, which precludes a safe discharge. Discharge to home, when clinically stable.  Subjective: No significant events overnight, patient is complaining  of significant lower backache and asking for opiates, so started on oxycodone as needed.  Patient is not feeling much improvement in the lower extremity edema.  She is more concerned about pain at this time.  We will continue to follow along and continue same treatment.   Physical Exam: General: NAD, lying comfortably Appear in no distress, affect appropriate Eyes: PERRLA ENT: Oral Mucosa Clear, moist  Neck: no JVD,  Cardiovascular: S1 and S2 Present, no Murmur,  Respiratory: good respiratory effort, Bilateral Air entry equal and Decreased, no Crackles, no wheezes Abdomen: Bowel Sound present, Soft and no tenderness,  Skin: no rashes Extremities: 4+ Pedal edema, no calf tenderness Neurologic: without any new focal findings Gait not checked due to patient safety concerns  Vitals:   05/08/22 0420 05/08/22 0422 05/08/22 0856 05/08/22 1142  BP: 115/71  107/60 127/86  Pulse: 82  77 90  Resp: 18  20 20   Temp: 97.7 F (36.5 C)  98 F (36.7 C) (!) 97.5 F (36.4 C)  TempSrc:   Oral   SpO2: 95%  96% 99%  Weight:  116.2 kg      Intake/Output Summary (Last 24 hours) at 05/08/2022 1328 Last data filed at 05/08/2022 1202 Gross per 24 hour  Intake 1440 ml  Output 4400 ml  Net -2960 ml   Filed Weights   05/06/22 0413 05/07/22 0500 05/08/22 0422  Weight: 117.1 kg 117.9 kg 116.2 kg    Data Reviewed: I have personally reviewed and interpreted daily labs, tele strips, imagings as discussed above. I reviewed all nursing notes, pharmacy notes, vitals, pertinent old records I have discussed plan of care as described above with RN and patient/family.  CBC: Recent Labs  Lab 05/02/22 0553 05/03/22 0514 05/04/22 0556 05/05/22 0418 05/06/22 0323 05/07/22 0442 05/08/22 0526  WBC 9.4   < > 11.0* 12.7* 11.8* 10.9* 11.2*  NEUTROABS 5.3  --   --   --   --   --   --   HGB 13.3   < > 13.2 12.5 11.1* 11.3* 11.5*  HCT 43.3   < > 44.0 41.4 36.4 37.4 37.8  MCV 80.9   < > 82.9 82.8 81.1 81.3 80.9   PLT 222   < > 241 242 212 212 227   < > = values in this interval not displayed.   Basic Metabolic Panel: Recent Labs  Lab 05/02/22 0553 05/03/22 0514 05/04/22 0556 05/05/22 0418 05/06/22 0323 05/07/22 0442 05/08/22 0526  NA 128* 129* 132* 130* 128* 129* 135  K 3.7 4.1 4.1 4.5 4.7 4.6 3.7  CL 93* 92* 95* 94* 94* 93* 94*  CO2 28 32 30 29 28 28  32  GLUCOSE 358* 436* 396* 406* 471* 352* 202*  BUN 17 25* 32* 43* 50* 50* 49*  CREATININE 0.80 0.93 0.95 1.03* 1.07* 0.98 0.98  CALCIUM 8.2* 8.2* 8.2* 8.0* 8.0* 8.3* 8.4*  MG 1.9 1.9 1.9 1.9  --  2.0  --   PHOS  --  4.0 4.1 4.7*  --  5.2*  --     Studies: No results found.  Scheduled Meds:  acetaminophen  650 mg Oral Q8H   aspirin EC  81 mg Oral Daily   atorvastatin  10 mg Oral Daily   buprenorphine-naloxone  1 tablet Sublingual  TID   cyanocobalamin  1,000 mcg Intramuscular Daily   Followed by   Melene Muller ON 05/14/2022] vitamin B-12  1,000 mcg Oral Daily   enoxaparin (LOVENOX) injection  0.5 mg/kg Subcutaneous Q24H   insulin aspart  0-20 Units Subcutaneous TID AC & HS   insulin aspart  25 Units Subcutaneous TID WC   insulin glargine-yfgn  75 Units Subcutaneous BID   irbesartan  75 mg Oral Daily   sodium chloride flush  3 mL Intravenous Q12H   Vitamin D (Ergocalciferol)  50,000 Units Oral Q7 days   Continuous Infusions:  sodium chloride     furosemide (LASIX) 200 mg in dextrose 5 % 100 mL (2 mg/mL) infusion 8 mg/hr (05/08/22 0552)   PRN Meds: sodium chloride, acetaminophen, diclofenac Sodium, fluticasone, [DISCONTINUED] hydrALAZINE **OR** hydrALAZINE, hydrOXYzine, ondansetron (ZOFRAN) IV, oxyCODONE, sodium chloride flush  Time spent: 50 minutes  Author: Gillis Santa. MD [facility] 05/08/2022 1:28 PM  To reach On-call, see care teams to locate the attending and reach out to them via [email or web address]. If 7PM-7AM, please contact night-coverage If you still have difficulty reaching the attending provider, please page the [facility]  (Director on Call) for [facility] on amion for assistance.
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
## 2022-05-09 DIAGNOSIS — I5033 Acute on chronic diastolic (congestive) heart failure: Secondary | ICD-10-CM | POA: Diagnosis not present

## 2022-05-09 LAB — CBC
HCT: 37.6 % (ref 36.0–46.0)
Hemoglobin: 11.4 g/dL — ABNORMAL LOW (ref 12.0–15.0)
MCH: 24.7 pg — ABNORMAL LOW (ref 26.0–34.0)
MCHC: 30.3 g/dL (ref 30.0–36.0)
MCV: 81.6 fL (ref 80.0–100.0)
Platelets: 240 10*3/uL (ref 150–400)
RBC: 4.61 MIL/uL (ref 3.87–5.11)
RDW: 14.7 % (ref 11.5–15.5)
WBC: 9.7 10*3/uL (ref 4.0–10.5)
nRBC: 0 % (ref 0.0–0.2)

## 2022-05-09 LAB — BASIC METABOLIC PANEL
Anion gap: 8 (ref 5–15)
BUN: 44 mg/dL — ABNORMAL HIGH (ref 6–20)
CO2: 33 mmol/L — ABNORMAL HIGH (ref 22–32)
Calcium: 8.2 mg/dL — ABNORMAL LOW (ref 8.9–10.3)
Chloride: 93 mmol/L — ABNORMAL LOW (ref 98–111)
Creatinine, Ser: 0.93 mg/dL (ref 0.44–1.00)
GFR, Estimated: >60 mL/min (ref >60)
Glucose, Bld: 159 mg/dL — ABNORMAL HIGH (ref 70–99)
Potassium: 3.6 mmol/L (ref 3.5–5.1)
Sodium: 134 mmol/L — ABNORMAL LOW (ref 135–145)

## 2022-05-09 LAB — GLUCOSE, CAPILLARY
Glucose-Capillary: 119 mg/dL — ABNORMAL HIGH (ref 70–99)
Glucose-Capillary: 229 mg/dL — ABNORMAL HIGH (ref 70–99)
Glucose-Capillary: 144 mg/dL — ABNORMAL HIGH (ref 70–99)
Glucose-Capillary: 201 mg/dL — ABNORMAL HIGH (ref 70–99)

## 2022-05-09 MED ORDER — METOLAZONE 5 MG PO TABS
5.0000 mg | ORAL_TABLET | Freq: Every day | ORAL | Status: DC
  Administered 2022-05-10: 5 mg via ORAL
  Filled 2022-05-09 (×3): qty 1

## 2022-05-09 NOTE — Progress Notes (Signed)
[facility] Progress Note  Patient: Caitlyn Fuller    ZOX:[id number]  DOA: 05/01/2022     Date of Service: the patient was seen and examined on 05/09/2022  Chief Complaint  Patient presents with   Hyperglycemia   Brief hospital course: DYANNA SEITER is a 47 y.o. female with medical history significant of HFpEF, insulin-dependent type 2 diabetes, hypertension, polysubstance abuse, chronic pain syndrome, morbid obesity, who presents to the ED with complaints of elevated blood sugar.  Patient is noncompliant with her medications   Assessment and Plan:  # Hyperglycemia, uncontrolled diabetes mellitus due to noncompliance IDDM T2 4/28 increased  Semglee 75 units twice daily 4/27 Increased NovoLog 25 units 3 times daily and sliding scale 4/26 NovoLog 10 units IV given due to high blood sugar Monitor CBG, continue diabetic diet Diabetic coordinator consulted for further management and counseling   # Anasarca, diastolic CHF patient has significant bilateral lower extremity edema S/p Lasix IV 40 daily 4/24 started Lasix 4 mg/hr  IV infusion  4/28 increased Lasix 8 mg/hr IV infusion due to no improvement in lower extremity edema 4/30 started metolazone 5 mg p.o. daily for 3 days D-dimer negative venous duplex negative for DVT US renal: The kidneys are unremarkable. A large postvoid residual of 108 cc is identified. The bladder is otherwise unremarkable. Monitor intake output and renal functions, continue fluid restriction 1.5 L/day  # Accelerated hypertension  Uncontrolled blood pressure, BP 197/66 on admission Continue irbesartan 75 mg p.o. daily home dose Use hydralazine p.o. or IV as needed as per parameters S/p Lasix 40 mg IV daily Monitor BP and titrate medications accordingly 4/24 --4/28 s/p Lopressor 50 mg p.o. BID, discontinued, patient was feeling dizziness after taking medication.   4/28 started Coreg 6.25 mg p.o. twice daily 4/29 blood pressures soft now,  patient refused coreg last night, so discontinued   # Chronic pain Patient endorses chronic pain for which she states she was previously on Percocet.  PDMP reviewed and last prescription for oxycodone was in November 2023. - Conservative management and recommend outpatient follow-up with PCP 4/28 patient stated no allergy to Tylenol, so started Tylenol 650 mg p.o. every 6 hourly as needed 4/29 started oxycodone 5 mg p.o. every 6 hours as needed  # Polysubstance abuse - Continue home Suboxone  # Sore throat Patient states that she developed perioral erythema and lesions on her tongue after taking a new blood pressure medicine last night.  At this time, she continues to have a sore throat.  On exam, she has some mild erythema, but no edema. - Supportive management  # Obesity Body mass index is 46.89 kg/m.  Interventions: Calorie restricted diet and daily exercise advised to lose body weight.  Lifestyle modification discussed.   # Vitamin D deficiency: started vitamin D 50,000 units p.o. weekly, follow with PCP to repeat vitamin D level after 3 to 6 months. #Vitamin B12 level 205 at lower end, goal >400, started vitamin B12 1000 mcg IM injection daily for 7 days during hospital stay followed by oral supplement at discharge.  Follow with PCP to repeat B12 level after 3 to 6 months.     Diet: Diabetic diet DVT Prophylaxis: Subcutaneous Lovenox   Advance goals of care discussion: Full code  Family Communication: family was not present at bedside, at the time of interview.  The pt provided permission to discuss medical plan with the family. Opportunity was given to ask question and all questions were answered satisfactorily.  Disposition:  Pt is from Home, admitted with diabetes and accelerated blood pressure, anasarca, still has uncontrolled diabetes, and on IV Lasix infusion, which precludes a safe discharge. Discharge to home, when clinically stable.  Subjective: No significant  events overnight, patient still does not feel improvement in the lower extremity edema, but urine output is good as per her.  We will continue current treatment and added metolazone.   Physical Exam: General: NAD, lying comfortably Appear in no distress, affect appropriate Eyes: PERRLA ENT: Oral Mucosa Clear, moist  Neck: no JVD,  Cardiovascular: S1 and S2 Present, no Murmur,  Respiratory: good respiratory effort, Bilateral Air entry equal and Decreased, no Crackles, no wheezes Abdomen: Bowel Sound present, Soft and no tenderness,  Skin: no rashes Extremities: 4+ Pedal edema, no calf tenderness Neurologic: without any new focal findings Gait not checked due to patient safety concerns  Vitals:   05/09/22 0615 05/09/22 0734 05/09/22 1150 05/09/22 1539  BP:  133/72 (!) 172/80 (!) 144/69  Pulse:  79 99 89  Resp:  18 18 18   Temp:  97.6 F (36.4 C) 97.8 F (36.6 C)   TempSrc:      SpO2:  100% 97% 100%  Weight: 117.4 kg       Intake/Output Summary (Last 24 hours) at 05/09/2022 1614 Last data filed at 05/09/2022 1000 Gross per 24 hour  Intake 959.78 ml  Output 1000 ml  Net -40.22 ml   Filed Weights   05/07/22 0500 05/08/22 0422 05/09/22 0615  Weight: 117.9 kg 116.2 kg 117.4 kg    Data Reviewed: I have personally reviewed and interpreted daily labs, tele strips, imagings as discussed above. I reviewed all nursing notes, pharmacy notes, vitals, pertinent old records I have discussed plan of care as described above with RN and patient/family.  CBC: Recent Labs  Lab 05/05/22 0418 05/06/22 0323 05/07/22 0442 05/08/22 0526 05/09/22 0539  WBC 12.7* 11.8* 10.9* 11.2* 9.7  HGB 12.5 11.1* 11.3* 11.5* 11.4*  HCT 41.4 36.4 37.4 37.8 37.6  MCV 82.8 81.1 81.3 80.9 81.6  PLT 242 212 212 227 240   Basic Metabolic Panel: Recent Labs  Lab 05/03/22 0514 05/04/22 0556 05/05/22 0418 05/06/22 0323 05/07/22 0442 05/08/22 0526 05/09/22 0539  NA 129* 132* 130* 128* 129* 135 134*   K 4.1 4.1 4.5 4.7 4.6 3.7 3.6  CL 92* 95* 94* 94* 93* 94* 93*  CO2 32 30 29 28 28  32 33*  GLUCOSE 436* 396* 406* 471* 352* 202* 159*  BUN 25* 32* 43* 50* 50* 49* 44*  CREATININE 0.93 0.95 1.03* 1.07* 0.98 0.98 0.93  CALCIUM 8.2* 8.2* 8.0* 8.0* 8.3* 8.4* 8.2*  MG 1.9 1.9 1.9  --  2.0  --   --   PHOS 4.0 4.1 4.7*  --  5.2*  --   --     Studies: No results found.  Scheduled Meds:  acetaminophen  650 mg Oral Q8H   aspirin EC  81 mg Oral Daily   atorvastatin  10 mg Oral Daily   buprenorphine-naloxone  1 tablet Sublingual TID   cyanocobalamin  1,000 mcg Intramuscular Daily   Followed by   Melene Muller ON 05/14/2022] vitamin B-12  1,000 mcg Oral Daily   enoxaparin (LOVENOX) injection  0.5 mg/kg Subcutaneous Q24H   insulin aspart  0-20 Units Subcutaneous TID AC & HS   insulin aspart  25 Units Subcutaneous TID WC   insulin glargine-yfgn  75 Units Subcutaneous BID   irbesartan  75 mg Oral  Daily   metolazone  5 mg Oral Daily   sodium chloride flush  3 mL Intravenous Q12H   Vitamin D (Ergocalciferol)  50,000 Units Oral Q7 days   Continuous Infusions:  sodium chloride     furosemide (LASIX) 200 mg in dextrose 5 % 100 mL (2 mg/mL) infusion 8 mg/hr (05/09/22 0836)   PRN Meds: sodium chloride, acetaminophen, diclofenac Sodium, fluticasone, [DISCONTINUED] hydrALAZINE **OR** hydrALAZINE, hydrOXYzine, ondansetron (ZOFRAN) IV, oxyCODONE, sodium chloride flush  Time spent: 50 minutes  Author: Gillis Santa. MD [facility] 05/09/2022 4:14 PM  To reach On-call, see care teams to locate the attending and reach out to them via [email or web address]. If 7PM-7AM, please contact night-coverage If you still have difficulty reaching the attending provider, please page the [facility] (Director on Call) for [facility] on amion for assistance.

## 2022-05-10 DIAGNOSIS — I5033 Acute on chronic diastolic (congestive) heart failure: Secondary | ICD-10-CM | POA: Diagnosis not present

## 2022-05-10 LAB — GLUCOSE, CAPILLARY
Glucose-Capillary: 153 mg/dL — ABNORMAL HIGH (ref 70–99)
Glucose-Capillary: 216 mg/dL — ABNORMAL HIGH (ref 70–99)
Glucose-Capillary: 232 mg/dL — ABNORMAL HIGH (ref 70–99)
Glucose-Capillary: 244 mg/dL — ABNORMAL HIGH (ref 70–99)
Glucose-Capillary: 276 mg/dL — ABNORMAL HIGH (ref 70–99)
Glucose-Capillary: 76 mg/dL (ref 70–99)

## 2022-05-10 LAB — BASIC METABOLIC PANEL
Anion gap: 7 (ref 5–15)
BUN: 42 mg/dL — ABNORMAL HIGH (ref 6–20)
CO2: 35 mmol/L — ABNORMAL HIGH (ref 22–32)
Calcium: 8.2 mg/dL — ABNORMAL LOW (ref 8.9–10.3)
Chloride: 92 mmol/L — ABNORMAL LOW (ref 98–111)
Creatinine, Ser: 0.87 mg/dL (ref 0.44–1.00)
GFR, Estimated: >60 mL/min (ref >60)
Glucose, Bld: 236 mg/dL — ABNORMAL HIGH (ref 70–99)
Potassium: 4 mmol/L (ref 3.5–5.1)
Sodium: 134 mmol/L — ABNORMAL LOW (ref 135–145)

## 2022-05-10 LAB — CBC
HCT: 38.7 % (ref 36.0–46.0)
Hemoglobin: 11.8 g/dL — ABNORMAL LOW (ref 12.0–15.0)
MCH: 24.9 pg — ABNORMAL LOW (ref 26.0–34.0)
MCHC: 30.5 g/dL (ref 30.0–36.0)
MCV: 81.6 fL (ref 80.0–100.0)
Platelets: 260 10*3/uL (ref 150–400)
RBC: 4.74 MIL/uL (ref 3.87–5.11)
RDW: 14.7 % (ref 11.5–15.5)
WBC: 12.2 10*3/uL — ABNORMAL HIGH (ref 4.0–10.5)
nRBC: 0 % (ref 0.0–0.2)

## 2022-05-10 NOTE — Progress Notes (Signed)
Pt found inadvertently off the floor walking to the chapel with S.O./ pt made aware that in the future she needs to notify staff when she wants to leave the unit. Staff had no idea where she was. Pt was frustrated stating " I thought as long as I was on the 2nd floor I was ok". Pt educated on policy. No additional interventions needed at this time. Pt walked back to room by Clinical research associate.

## 2022-05-10 NOTE — TOC Progression Note (Signed)
Transition of Care [facility]) - Progression Note    Patient Details  Name: Caitlyn Fuller MRN: [id number] Date of Birth: 09-28-1975  Transition of Care [facility]) CM/SW Contact  Truddie Hidden, RN Phone Number: 05/10/2022, 1:56 PM  Clinical Narrative:    Case reviewed for DME needs and changes in discharge disposition.    Expected Discharge Plan: Home/Self Care Barriers to Discharge: Continued Medical Work up  Expected Discharge Plan and Services       Living arrangements for the past 2 months: Single Family Home                                       Social Determinants of Health (SDOH) Interventions SDOH Screenings   Food Insecurity: No Food Insecurity (05/02/2022)  Housing: Low Risk  (05/02/2022)  Transportation Needs: No Transportation Needs (05/02/2022)  Utilities: Not At Risk (05/02/2022)  Tobacco Use: Medium Risk (05/02/2022)    Readmission Risk Interventions    11/28/2021   11:17 AM 11/21/2021   11:34 AM  Readmission Risk Prevention Plan  Transportation Screening Complete Complete  PCP or Specialist Appt within 3-5 Days Complete Complete  Social Work Consult for Recovery Care Planning/Counseling Complete Complete  Palliative Care Screening  Not Applicable  Medication Review Oceanographer) Complete Complete

## 2022-05-10 NOTE — Progress Notes (Signed)
[facility] Progress Note  Patient: Caitlyn Fuller    WUJ:[id number]  DOA: 05/01/2022     Date of Service: the patient was seen and examined on 05/10/2022  Chief Complaint  Patient presents with   Hyperglycemia   Brief hospital course: Caitlyn Fuller is a 47 y.o. female with medical history significant of HFpEF, insulin-dependent type 2 diabetes, hypertension, polysubstance abuse, chronic pain syndrome, morbid obesity, who presents to the ED with complaints of elevated blood sugar.  Patient is noncompliant with her medications   Assessment and Plan:  # Hyperglycemia, uncontrolled diabetes mellitus due to noncompliance IDDM T2 4/28 increased  Semglee 75 units twice daily 4/27 Increased NovoLog 25 units 3 times daily and sliding scale 4/26 NovoLog 10 units IV given due to high blood sugar Monitor CBG, continue diabetic diet Diabetic coordinator consulted for further management and counseling   # Anasarca, diastolic CHF patient has significant bilateral lower extremity edema S/p Lasix IV 40 daily 4/24 started Lasix 4 mg/hr  IV infusion  4/28 increased Lasix 8 mg/hr IV infusion due to no improvement in lower extremity edema 4/30 started metolazone 5 mg p.o. daily for 3 days 5/1 increase Lasix 10 mg/h IV infusion D-dimer negative venous duplex negative for DVT US renal: The kidneys are unremarkable. A large postvoid residual of 108 cc is identified. The bladder is otherwise unremarkable. Monitor intake output and renal functions, continue fluid restriction 1.5 L/day Nephrology consulted to help with diuresis  # Accelerated hypertension  Uncontrolled blood pressure, BP 197/66 on admission Continue irbesartan 75 mg p.o. daily home dose Use hydralazine p.o. or IV as needed as per parameters S/p Lasix 40 mg IV daily Monitor BP and titrate medications accordingly 4/24 --4/28 s/p Lopressor 50 mg p.o. BID, discontinued, patient was feeling dizziness after taking  medication.   4/28 started Coreg 6.25 mg p.o. twice daily 4/29 blood pressures soft now, patient refused coreg last night, so discontinued   # Chronic pain Patient endorses chronic pain for which she states she was previously on Percocet.  PDMP reviewed and last prescription for oxycodone was in November 2023. - Conservative management and recommend outpatient follow-up with PCP 4/28 patient stated no allergy to Tylenol, so started Tylenol 650 mg p.o. every 6 hourly as needed 4/29 started oxycodone 5 mg p.o. every 6 hours as needed  # Polysubstance abuse - Continue home Suboxone  # Sore throat Patient states that she developed perioral erythema and lesions on her tongue after taking a new blood pressure medicine last night.  At this time, she continues to have a sore throat.  On exam, she has some mild erythema, but no edema. - Supportive management  # Obesity Body mass index is 46.89 kg/m.  Interventions: Calorie restricted diet and daily exercise advised to lose body weight.  Lifestyle modification discussed.   # Vitamin D deficiency: started vitamin D 50,000 units p.o. weekly, follow with PCP to repeat vitamin D level after 3 to 6 months. #Vitamin B12 level 205 at lower end, goal >400, started vitamin B12 1000 mcg IM injection daily for 7 days during hospital stay followed by oral supplement at discharge.  Follow with PCP to repeat B12 level after 3 to 6 months.     Diet: Diabetic diet DVT Prophylaxis: Subcutaneous Lovenox   Advance goals of care discussion: Full code  Family Communication: family was not present at bedside, at the time of interview.  The pt provided permission to discuss medical plan with the family. Opportunity  was given to ask question and all questions were answered satisfactorily.   Disposition:  Pt is from Home, admitted with diabetes and accelerated blood pressure, anasarca, still has uncontrolled diabetes, and on IV Lasix infusion, which precludes a  safe discharge. Discharge to home, when clinically stable.  Subjective: No significant events overnight, patient stated that she is making good amount of urine, but does not feel much improvement in lower extremity edema.  Denies any complaints, resting well.   Physical Exam: General: NAD, lying comfortably Appear in no distress, affect appropriate Eyes: PERRLA ENT: Oral Mucosa Clear, moist  Neck: no JVD,  Cardiovascular: S1 and S2 Present, no Murmur,  Respiratory: good respiratory effort, Bilateral Air entry equal and Decreased, no Crackles, no wheezes Abdomen: Bowel Sound present, Soft and no tenderness,  Skin: no rashes Extremities: 4+ Pedal edema, no calf tenderness Neurologic: without any new focal findings Gait not checked due to patient safety concerns  Vitals:   05/10/22 0057 05/10/22 0430 05/10/22 0743 05/10/22 1209  BP: (!) 146/77  134/75 (!) 142/98  Pulse: 94  85 85  Resp: 18  20 20   Temp: 98 F (36.7 C)  98.3 F (36.8 C) 98.3 F (36.8 C)  TempSrc: Oral   Oral  SpO2: 96%  100% 98%  Weight:  118.4 kg      Intake/Output Summary (Last 24 hours) at 05/10/2022 1445 Last data filed at 05/10/2022 1158 Gross per 24 hour  Intake 1778.79 ml  Output 1200 ml  Net 578.79 ml   Filed Weights   05/08/22 0422 05/09/22 0615 05/10/22 0430  Weight: 116.2 kg 117.4 kg 118.4 kg    Data Reviewed: I have personally reviewed and interpreted daily labs, tele strips, imagings as discussed above. I reviewed all nursing notes, pharmacy notes, vitals, pertinent old records I have discussed plan of care as described above with RN and patient/family.  CBC: Recent Labs  Lab 05/06/22 0323 05/07/22 0442 05/08/22 0526 05/09/22 0539 05/10/22 0334  WBC 11.8* 10.9* 11.2* 9.7 12.2*  HGB 11.1* 11.3* 11.5* 11.4* 11.8*  HCT 36.4 37.4 37.8 37.6 38.7  MCV 81.1 81.3 80.9 81.6 81.6  PLT 212 212 227 240 260   Basic Metabolic Panel: Recent Labs  Lab 05/04/22 0556 05/05/22 0418  05/06/22 0323 05/07/22 0442 05/08/22 0526 05/09/22 0539 05/10/22 0334  NA 132* 130* 128* 129* 135 134* 134*  K 4.1 4.5 4.7 4.6 3.7 3.6 4.0  CL 95* 94* 94* 93* 94* 93* 92*  CO2 30 29 28 28  32 33* 35*  GLUCOSE 396* 406* 471* 352* 202* 159* 236*  BUN 32* 43* 50* 50* 49* 44* 42*  CREATININE 0.95 1.03* 1.07* 0.98 0.98 0.93 0.87  CALCIUM 8.2* 8.0* 8.0* 8.3* 8.4* 8.2* 8.2*  MG 1.9 1.9  --  2.0  --   --   --   PHOS 4.1 4.7*  --  5.2*  --   --   --     Studies: No results found.  Scheduled Meds:  acetaminophen  650 mg Oral Q8H   aspirin EC  81 mg Oral Daily   atorvastatin  10 mg Oral Daily   buprenorphine-naloxone  1 tablet Sublingual TID   cyanocobalamin  1,000 mcg Intramuscular Daily   Followed by   Melene Muller ON 05/14/2022] vitamin B-12  1,000 mcg Oral Daily   enoxaparin (LOVENOX) injection  0.5 mg/kg Subcutaneous Q24H   insulin aspart  0-20 Units Subcutaneous TID AC & HS   insulin aspart  25 Units Subcutaneous  TID WC   insulin glargine-yfgn  75 Units Subcutaneous BID   irbesartan  75 mg Oral Daily   metolazone  5 mg Oral Daily   sodium chloride flush  3 mL Intravenous Q12H   Vitamin D (Ergocalciferol)  50,000 Units Oral Q7 days   Continuous Infusions:  sodium chloride     furosemide (LASIX) 200 mg in dextrose 5 % 100 mL (2 mg/mL) infusion 8 mg/hr (05/10/22 1118)   PRN Meds: sodium chloride, acetaminophen, diclofenac Sodium, fluticasone, [DISCONTINUED] hydrALAZINE **OR** hydrALAZINE, hydrOXYzine, ondansetron (ZOFRAN) IV, oxyCODONE, sodium chloride flush  Time spent: 50 minutes  Author: Gillis Santa. MD [facility] 05/10/2022 2:45 PM  To reach On-call, see care teams to locate the attending and reach out to them via [email or web address]. If 7PM-7AM, please contact night-coverage If you still have difficulty reaching the attending provider, please page the [facility] (Director on Call) for [facility] on amion for assistance.

## 2022-05-11 DIAGNOSIS — I5033 Acute on chronic diastolic (congestive) heart failure: Secondary | ICD-10-CM | POA: Diagnosis not present

## 2022-05-11 LAB — CBC
HCT: 35.7 % — ABNORMAL LOW (ref 36.0–46.0)
Hemoglobin: 11.2 g/dL — ABNORMAL LOW (ref 12.0–15.0)
MCH: 24.9 pg — ABNORMAL LOW (ref 26.0–34.0)
MCHC: 31.4 g/dL (ref 30.0–36.0)
MCV: 79.5 fL — ABNORMAL LOW (ref 80.0–100.0)
Platelets: 256 10*3/uL (ref 150–400)
RBC: 4.49 MIL/uL (ref 3.87–5.11)
RDW: 14.6 % (ref 11.5–15.5)
WBC: 11.9 10*3/uL — ABNORMAL HIGH (ref 4.0–10.5)
nRBC: 0 % (ref 0.0–0.2)

## 2022-05-11 LAB — GLUCOSE, CAPILLARY
Glucose-Capillary: 195 mg/dL — ABNORMAL HIGH (ref 70–99)
Glucose-Capillary: 242 mg/dL — ABNORMAL HIGH (ref 70–99)
Glucose-Capillary: 232 mg/dL — ABNORMAL HIGH (ref 70–99)
Glucose-Capillary: 119 mg/dL — ABNORMAL HIGH (ref 70–99)
Glucose-Capillary: 154 mg/dL — ABNORMAL HIGH (ref 70–99)

## 2022-05-11 LAB — BASIC METABOLIC PANEL
Anion gap: 11 (ref 5–15)
BUN: 52 mg/dL — ABNORMAL HIGH (ref 6–20)
CO2: 35 mmol/L — ABNORMAL HIGH (ref 22–32)
Calcium: 8.4 mg/dL — ABNORMAL LOW (ref 8.9–10.3)
Chloride: 85 mmol/L — ABNORMAL LOW (ref 98–111)
Creatinine, Ser: 1.02 mg/dL — ABNORMAL HIGH (ref 0.44–1.00)
GFR, Estimated: >60 mL/min (ref >60)
Glucose, Bld: 206 mg/dL — ABNORMAL HIGH (ref 70–99)
Potassium: 3 mmol/L — ABNORMAL LOW (ref 3.5–5.1)
Sodium: 131 mmol/L — ABNORMAL LOW (ref 135–145)

## 2022-05-11 MED ORDER — ALBUMIN HUMAN 25 % IV SOLN
12.5000 g | Freq: Four times a day (QID) | INTRAVENOUS | Status: AC
  Administered 2022-05-11 – 2022-05-13 (×8): 12.5 g via INTRAVENOUS
  Filled 2022-05-11 (×8): qty 50

## 2022-05-11 MED ORDER — POTASSIUM CHLORIDE CRYS ER 20 MEQ PO TBCR
40.0000 meq | EXTENDED_RELEASE_TABLET | Freq: Three times a day (TID) | ORAL | Status: AC
  Administered 2022-05-11 (×2): 40 meq via ORAL
  Filled 2022-05-11 (×2): qty 2

## 2022-05-11 NOTE — Progress Notes (Signed)
[facility] Progress Note  Patient: Caitlyn Fuller    UEA:[id number]  DOA: 05/01/2022     Date of Service: the patient was seen and examined on 05/11/2022  Chief Complaint  Patient presents with   Hyperglycemia   Brief hospital course: Caitlyn Fuller is a 47 y.o. female with medical history significant of HFpEF, insulin-dependent type 2 diabetes, hypertension, polysubstance abuse, chronic pain syndrome, morbid obesity, who presents to the ED with complaints of elevated blood sugar.  Patient is noncompliant with her medications   Assessment and Plan:  # Hyperglycemia, uncontrolled diabetes mellitus due to noncompliance IDDM T2 4/28 increased  Semglee 75 units twice daily 4/27 Increased NovoLog 25 units 3 times daily and sliding scale 4/26 NovoLog 10 units IV given due to high blood sugar Monitor CBG, continue diabetic diet Diabetic coordinator consulted for further management and counseling   # Anasarca, diastolic CHF patient has significant bilateral lower extremity edema S/p Lasix IV 40 daily 4/24 started Lasix 4 mg/hr  IV infusion  4/28 increased Lasix 8 mg/hr IV infusion due to no improvement in lower extremity edema 4/30 s/p metolazone 5 mg p.o. daily for 2 days 5/1 increase Lasix 10 mg/h IV infusion D-dimer negative venous duplex negative for DVT US renal: The kidneys are unremarkable. A large postvoid residual of 108 cc is identified. The bladder is otherwise unremarkable. Monitor intake output and renal functions, continue fluid restriction 1.5 L/day Nephrology consulted, 5/2 decrease Lasix 4 mg/h, discontinued metolazone due to contraction alkalosis 5/2 albumin 12.5 g IV every 6 hourly for 2 days  # Accelerated hypertension  Uncontrolled blood pressure, BP 197/66 on admission Continue irbesartan 75 mg p.o. daily home dose Use hydralazine p.o. or IV as needed as per parameters S/p Lasix 40 mg IV daily Monitor BP and titrate medications  accordingly 4/24 --4/28 s/p Lopressor 50 mg p.o. BID, discontinued, patient was feeling dizziness after taking medication.   4/28 started Coreg 6.25 mg p.o. twice daily 4/29 blood pressures soft now, patient refused coreg last night, so discontinued   # Chronic pain Patient endorses chronic pain for which she states she was previously on Percocet.  PDMP reviewed and last prescription for oxycodone was in November 2023. - Conservative management and recommend outpatient follow-up with PCP 4/28 patient stated no allergy to Tylenol, so started Tylenol 650 mg p.o. every 6 hourly as needed 4/29 started oxycodone 5 mg p.o. every 6 hours as needed  # Polysubstance abuse - Continue home Suboxone  # Sore throat Patient states that she developed perioral erythema and lesions on her tongue after taking a new blood pressure medicine last night.  At this time, she continues to have a sore throat.  On exam, she has some mild erythema, but no edema. - Supportive management  # Obesity Body mass index is 46.89 kg/m.  Interventions: Calorie restricted diet and daily exercise advised to lose body weight.  Lifestyle modification discussed.   # Vitamin D deficiency: started vitamin D 50,000 units p.o. weekly, follow with PCP to repeat vitamin D level after 3 to 6 months. #Vitamin B12 level 205 at lower end, goal >400, started vitamin B12 1000 mcg IM injection daily for 7 days during hospital stay followed by oral supplement at discharge.  Follow with PCP to repeat B12 level after 3 to 6 months.     Diet: Diabetic diet, fluid restriction 1.5 L/day DVT Prophylaxis: Subcutaneous Lovenox   Advance goals of care discussion: Full code  Family Communication: family was  not present at bedside, at the time of interview.  The pt provided permission to discuss medical plan with the family. Opportunity was given to ask question and all questions were answered satisfactorily.   Disposition:  Pt is from Home,  admitted with diabetes,  accelerated blood pressure, and anasarca, still has lower extremity edema, on IV Lasix infusion, which precludes a safe discharge. Discharge to home, when clinically stable.  Subjective: No significant events overnight, patient feels a little bit improvement in the lower extremity edema but is still has significant swelling, pain is under control.  Denied any chest pain or palpitation, no shortness of breath.   Physical Exam: General: NAD, lying comfortably Appear in no distress, affect appropriate Eyes: PERRLA ENT: Oral Mucosa Clear, moist  Neck: no JVD,  Cardiovascular: S1 and S2 Present, no Murmur,  Respiratory: good respiratory effort, Bilateral Air entry equal and Decreased, no Crackles, no wheezes Abdomen: Bowel Sound present, Soft and no tenderness,  Skin: no rashes Extremities: 4+ Pedal edema, no calf tenderness Neurologic: without any new focal findings Gait not checked due to patient safety concerns  Vitals:   05/11/22 0535 05/11/22 0804 05/11/22 1144 05/11/22 1528  BP: 130/81 (!) 139/94 (!) 148/91 120/75  Pulse: 89 85 84 86  Resp: 18 18 16 18   Temp: 98.2 F (36.8 C) 98.2 F (36.8 C) 98.2 F (36.8 C) 97.9 F (36.6 C)  TempSrc: Oral   Oral  SpO2: 93% 98% 98% 94%  Weight: 116.3 kg       Intake/Output Summary (Last 24 hours) at 05/11/2022 1624 Last data filed at 05/10/2022 2353 Gross per 24 hour  Intake 172.53 ml  Output 1000 ml  Net -827.47 ml   Filed Weights   05/09/22 0615 05/10/22 0430 05/11/22 0535  Weight: 117.4 kg 118.4 kg 116.3 kg    Data Reviewed: I have personally reviewed and interpreted daily labs, tele strips, imagings as discussed above. I reviewed all nursing notes, pharmacy notes, vitals, pertinent old records I have discussed plan of care as described above with RN and patient/family.  CBC: Recent Labs  Lab 05/07/22 0442 05/08/22 0526 05/09/22 0539 05/10/22 0334 05/11/22 0711  WBC 10.9* 11.2* 9.7 12.2* 11.9*   HGB 11.3* 11.5* 11.4* 11.8* 11.2*  HCT 37.4 37.8 37.6 38.7 35.7*  MCV 81.3 80.9 81.6 81.6 79.5*  PLT 212 227 240 260 256   Basic Metabolic Panel: Recent Labs  Lab 05/05/22 0418 05/06/22 0323 05/07/22 0442 05/08/22 0526 05/09/22 0539 05/10/22 0334 05/11/22 0711  NA 130*   < > 129* 135 134* 134* 131*  K 4.5   < > 4.6 3.7 3.6 4.0 3.0*  CL 94*   < > 93* 94* 93* 92* 85*  CO2 29   < > 28 32 33* 35* 35*  GLUCOSE 406*   < > 352* 202* 159* 236* 206*  BUN 43*   < > 50* 49* 44* 42* 52*  CREATININE 1.03*   < > 0.98 0.98 0.93 0.87 1.02*  CALCIUM 8.0*   < > 8.3* 8.4* 8.2* 8.2* 8.4*  MG 1.9  --  2.0  --   --   --   --   PHOS 4.7*  --  5.2*  --   --   --   --    < > = values in this interval not displayed.    Studies: No results found.  Scheduled Meds:  acetaminophen  650 mg Oral Q8H   aspirin EC  81 mg Oral  Daily   atorvastatin  10 mg Oral Daily   buprenorphine-naloxone  1 tablet Sublingual TID   cyanocobalamin  1,000 mcg Intramuscular Daily   Followed by   Melene Muller ON 05/14/2022] vitamin B-12  1,000 mcg Oral Daily   enoxaparin (LOVENOX) injection  0.5 mg/kg Subcutaneous Q24H   insulin aspart  0-20 Units Subcutaneous TID AC & HS   insulin aspart  25 Units Subcutaneous TID WC   insulin glargine-yfgn  75 Units Subcutaneous BID   irbesartan  75 mg Oral Daily   potassium chloride  40 mEq Oral TID   sodium chloride flush  3 mL Intravenous Q12H   Vitamin D (Ergocalciferol)  50,000 Units Oral Q7 days   Continuous Infusions:  sodium chloride     albumin human 12.5 g (05/11/22 1206)   furosemide (LASIX) 200 mg in dextrose 5 % 100 mL (2 mg/mL) infusion 4 mg/hr (05/11/22 1412)   PRN Meds: sodium chloride, acetaminophen, diclofenac Sodium, fluticasone, [DISCONTINUED] hydrALAZINE **OR** hydrALAZINE, hydrOXYzine, ondansetron (ZOFRAN) IV, oxyCODONE, sodium chloride flush  Time spent: 50 minutes  Author: Gillis Santa. MD [facility] 05/11/2022 4:24 PM  To reach On-call, see care teams  to locate the attending and reach out to them via [email or web address]. If 7PM-7AM, please contact night-coverage If you still have difficulty reaching the attending provider, please page the [facility] (Director on Call) for [facility] on amion for assistance.

## 2022-05-11 NOTE — Consult Note (Addendum)
[facility]  CONSULT NOTE    Date: 05/11/2022                  Patient Name:  Caitlyn Fuller  MRN: [id number]  DOB: 12/17/1975  Age / Sex: 47 y.o., female         PCP: [facility]                 Service Requesting Consult: TRH                 Reason for Consult: Chronic kidney disease stage II            History of Present Illness: Caitlyn Fuller is a 47 y.o.  female with past medical conditions including uncontrolled type 2 diabetes, hypertension, obesity, heart failure, polysubstance abuse, who was admitted to [facility] on 05/01/2022 for Anasarca [R60.1] Hypertensive urgency [I16.0] Acute on chronic systolic CHF (congestive heart failure) (HCC) [I50.23] Hypervolemia, unspecified hypervolemia type [E87.70] Uncontrolled type 2 diabetes mellitus with hyperglycemia (HCC) [E11.65] Acute on chronic heart failure with preserved ejection fraction (HFpEF) (HCC) [I50.33]  Patient presented to the emergency department with elevated blood sugars.  On exam patient found to be short of breath and volume overloaded.  Patient states she has been experiencing this for over a week.  Not prescribed any outpatient diuretic therapy.  Echo completed on 05/08/2022 shows EF 60 to 65% with a grade 2 diastolic dysfunction.  Serum bicarb has gradually increased over the past few days along with decrease in chloride.  Creatinine has remained stable throughout admission.  Patient now receiving high-dose furosemide drip.   Medications: Outpatient medications: Medications Prior to Admission  Medication Sig Dispense Refill Last Dose   blood glucose meter kit and supplies KIT Dispense based on patient and insurance preference. Check sugar 4 times a day before meals and bedtime 1 each 0    buprenorphine-naloxone (SUBOXONE) 8-2 mg SUBL SL tablet Place 1 tablet under the tongue 3 (three) times daily.      furosemide (LASIX) 40 MG tablet Take 1 tablet (40 mg total)  by mouth daily. 30 tablet 0 Unknown at Unknown   insulin aspart (NOVOLOG) 100 UNIT/ML injection 15 units subcutaneous injection prior to meals (Patient taking differently: Inject 15 Units into the skin 3 (three) times daily with meals.) 10 mL 11 Unknown at Unknown   insulin detemir (LEVEMIR) 100 UNIT/ML FlexPen Inject 50 Units into the skin at bedtime. 15 mL 0 Unknown at Unknown   Insulin Pen Needle 33G X 5 MM MISC 1 Dose by Does not apply route 3 (three) times daily before meals. 200 each 1    irbesartan (AVAPRO) 75 MG tablet Take 1 tablet (75 mg total) by mouth daily. 30 tablet 0 Unknown at Unknown   aspirin EC 81 MG tablet Take 1 tablet (81 mg total) by mouth daily. Swallow whole. (Patient not taking: Reported on 02/09/2022) 30 tablet 0 Not Taking   atorvastatin (LIPITOR) 10 MG tablet Take 1 tablet (10 mg total) by mouth daily. (Patient not taking: Reported on 02/09/2022) 30 tablet 0 Not Taking   potassium chloride SA (KLOR-CON M) 20 MEQ tablet Take 1 tablet (20 mEq total) by mouth daily. 30 tablet 0     Current medications: Current Facility-Administered Medications  Medication Dose Route Frequency Provider Last Rate Last Admin   0.9 %  sodium chloride infusion  250 mL Intravenous PRN Verdene Lennert, MD  acetaminophen (TYLENOL) tablet 650 mg  650 mg Oral Q8H Gillis Santa, MD   650 mg at 05/11/22 1310   acetaminophen (TYLENOL) tablet 650 mg  650 mg Oral Q6H PRN Gillis Santa, MD       albumin human 25 % solution 12.5 g  12.5 g Intravenous Q6H Gillis Santa, MD 60 mL/hr at 05/11/22 1206 12.5 g at 05/11/22 1206   aspirin EC tablet 81 mg  81 mg Oral Daily Verdene Lennert, MD   81 mg at 05/11/22 1011   atorvastatin (LIPITOR) tablet 10 mg  10 mg Oral Daily Verdene Lennert, MD   10 mg at 05/07/22 4098   buprenorphine-naloxone (SUBOXONE) 8-2 mg per SL tablet 1 tablet  1 tablet Sublingual TID Verdene Lennert, MD   1 tablet at 05/11/22 1011   cyanocobalamin (VITAMIN B12) injection 1,000 mcg  1,000  mcg Intramuscular Daily Gillis Santa, MD   1,000 mcg at 05/08/22 1191   Followed by   Melene Muller ON 05/14/2022] cyanocobalamin (VITAMIN B12) tablet 1,000 mcg  1,000 mcg Oral Daily Gillis Santa, MD       diclofenac Sodium (VOLTAREN) 1 % topical gel 2 g  2 g Topical BID PRN Gillis Santa, MD       enoxaparin (LOVENOX) injection 55 mg  0.5 mg/kg Subcutaneous Q24H Selinda Eon, RPH   55 mg at 05/01/22 2151   fluticasone (FLONASE) 50 MCG/ACT nasal spray 2 spray  2 spray Each Nare BID PRN Foust, Orpha Bur L, NP   2 spray at 05/11/22 1016   furosemide (LASIX) 200 mg in dextrose 5 % 100 mL (2 mg/mL) infusion  4 mg/hr Intravenous Continuous Mosetta Pigeon, MD 2 mL/hr at 05/11/22 1412 4 mg/hr at 05/11/22 1412   hydrALAZINE (APRESOLINE) tablet 50 mg  50 mg Oral Q6H PRN Gillis Santa, MD       hydrOXYzine (ATARAX) tablet 25 mg  25 mg Oral TID PRN Gillis Santa, MD   25 mg at 05/04/22 1002   insulin aspart (novoLOG) injection 0-20 Units  0-20 Units Subcutaneous TID AC & HS Gillis Santa, MD   7 Units at 05/11/22 1311   insulin aspart (novoLOG) injection 25 Units  25 Units Subcutaneous TID WC Gillis Santa, MD   25 Units at 05/11/22 1311   insulin glargine-yfgn (SEMGLEE) injection 75 Units  75 Units Subcutaneous BID Gillis Santa, MD   75 Units at 05/11/22 1010   irbesartan (AVAPRO) tablet 75 mg  75 mg Oral Daily Gillis Santa, MD   75 mg at 05/05/22 0848   ondansetron (ZOFRAN) injection 4 mg  4 mg Intravenous Q6H PRN Verdene Lennert, MD       oxyCODONE (Oxy IR/ROXICODONE) immediate release tablet 5 mg  5 mg Oral Q6H PRN Gillis Santa, MD   5 mg at 05/11/22 1319   potassium chloride SA (KLOR-CON M) CR tablet 40 mEq  40 mEq Oral TID Gillis Santa, MD   40 mEq at 05/11/22 1310   sodium chloride flush (NS) 0.9 % injection 3 mL  3 mL Intravenous Q12H Verdene Lennert, MD   3 mL at 05/11/22 1311   sodium chloride flush (NS) 0.9 % injection 3 mL  3 mL Intravenous PRN Verdene Lennert, MD       Vitamin D (Ergocalciferol)  (DRISDOL) 1.25 MG (50000 UNIT) capsule 50,000 Units  50,000 Units Oral Q7 days Gillis Santa, MD   50,000 Units at 05/06/22 1627      Allergies: Allergies  Allergen Reactions   Amlodipine Swelling  Gabapentin Anaphylaxis   Hydralazine Shortness Of Breath    Patient felt she had panic attack after IV hydralazine administered   Morphine And Related Anaphylaxis   Darvocet [Propoxyphene N-Acetaminophen] Hives   Toradol [Ketorolac Tromethamine] Other (See Comments)    migraines   Ultram [Tramadol] Hives   Vancomycin Itching      Past Medical History: Past Medical History:  Diagnosis Date   Asthma    CHF (congestive heart failure) (HCC)    Chronic heart failure with preserved ejection fraction (HFpEF) (HCC)    a. 04/2021 Echo: EF 65-70%, no rwma, GrI DD, mildly dil LA; b. 11/2021 Echo: EF 60-65%, no rwma, mod LVH, GrII DD, nl RV fxn, mildly dil LA, mild MR.   Diabetes mellitus without complication (HCC)    Hernia    Hypertension    Kidney infection    Morbid obesity (HCC)    Shingles      Past Surgical History: Past Surgical History:  Procedure Laterality Date   ABDOMINAL SURGERY     CESAREAN SECTION     CHOLECYSTECTOMY     colonscopy     TUBAL LIGATION       Family History: Family History  Problem Relation Age of Onset   Cancer Mother    Heart failure Father      Social History: Social History   Socioeconomic History   Marital status: Single    Spouse name: Not on file   Number of children: Not on file   Years of education: Not on file   Highest education level: Not on file  Occupational History   Not on file  Tobacco Use   Smoking status: Former    Packs/day: .5    Types: Cigarettes   Smokeless tobacco: Never  Vaping Use   Vaping Use: Never used  Substance and Sexual Activity   Alcohol use: No   Drug use: Yes    Types: "Crack" cocaine   Sexual activity: Not on file  Other Topics Concern   Not on file  Social History Narrative   Not on file    Social Determinants of Health   Financial Resource Strain: Not on file  Food Insecurity: No Food Insecurity (05/02/2022)   Hunger Vital Sign    Worried About Running Out of Food in the Last Year: Never true    Ran Out of Food in the Last Year: Never true  Transportation Needs: No Transportation Needs (05/02/2022)   PRAPARE - Administrator, Civil Service (Medical): No    Lack of Transportation (Non-Medical): No  Physical Activity: Not on file  Stress: Not on file  Social Connections: Not on file  Intimate Partner Violence: Not At Risk (05/02/2022)   Humiliation, Afraid, Rape, and Kick questionnaire    Fear of Current or Ex-Partner: No    Emotionally Abused: No    Physically Abused: No    Sexually Abused: No     Review of Systems: Review of Systems  Cardiovascular:  Positive for leg swelling.    Vital Signs: Blood pressure 120/75, pulse 86, temperature 97.9 F (36.6 C), temperature source Oral, resp. rate 18, weight 116.3 kg, SpO2 94 %.  Weight trends: Filed Weights   05/09/22 0615 05/10/22 0430 05/11/22 0535  Weight: 117.4 kg 118.4 kg 116.3 kg    Physical Exam: General: NAD  Head: Normocephalic, atraumatic. Moist oral mucosal membranes  Eyes: Anicteric  Lungs:  Clear to auscultation, room air  Heart: Regular rate and rhythm  Abdomen:  Soft, nontender, obese  Extremities:  3+ peripheral edema.  Neurologic: Alert and oriented, moving all four extremities  Skin: No lesions  Access: None     Lab results: Basic Metabolic Panel: Recent Labs  Lab 05/05/22 0418 05/06/22 0323 05/07/22 0442 05/08/22 0526 05/09/22 0539 05/10/22 0334 05/11/22 0711  NA 130*   < > 129*   < > 134* 134* 131*  K 4.5   < > 4.6   < > 3.6 4.0 3.0*  CL 94*   < > 93*   < > 93* 92* 85*  CO2 29   < > 28   < > 33* 35* 35*  GLUCOSE 406*   < > 352*   < > 159* 236* 206*  BUN 43*   < > 50*   < > 44* 42* 52*  CREATININE 1.03*   < > 0.98   < > 0.93 0.87 1.02*  CALCIUM 8.0*   < >  8.3*   < > 8.2* 8.2* 8.4*  MG 1.9  --  2.0  --   --   --   --   PHOS 4.7*  --  5.2*  --   --   --   --    < > = values in this interval not displayed.    Liver Function Tests: No results for input(s): "AST", "ALT", "ALKPHOS", "BILITOT", "PROT", "ALBUMIN" in the last 168 hours. No results for input(s): "LIPASE", "AMYLASE" in the last 168 hours. No results for input(s): "AMMONIA" in the last 168 hours.  CBC: Recent Labs  Lab 05/07/22 0442 05/08/22 0526 05/09/22 0539 05/10/22 0334 05/11/22 0711  WBC 10.9* 11.2* 9.7 12.2* 11.9*  HGB 11.3* 11.5* 11.4* 11.8* 11.2*  HCT 37.4 37.8 37.6 38.7 35.7*  MCV 81.3 80.9 81.6 81.6 79.5*  PLT 212 227 240 260 256    Cardiac Enzymes: No results for input(s): "CKTOTAL", "CKMB", "CKMBINDEX", "TROPONINI" in the last 168 hours.  BNP: Invalid input(s): "POCBNP"  CBG: Recent Labs  Lab 05/10/22 2110 05/10/22 2349 05/11/22 0807 05/11/22 1145 05/11/22 1530  GLUCAP 232* 276* 195* 242* 232*    Microbiology: Results for orders placed or performed during the hospital encounter of 11/16/21  SARS Coronavirus 2 by RT PCR (hospital order, performed in [facility] hospital lab) *cepheid single result test* Anterior Nasal Swab     Status: None   Collection Time: 11/16/21  5:13 PM   Specimen: Anterior Nasal Swab  Result Value Ref Range Status   SARS Coronavirus 2 by RT PCR NEGATIVE NEGATIVE Final    Comment: (NOTE) SARS-CoV-2 target nucleic acids are NOT DETECTED.  The SARS-CoV-2 RNA is generally detectable in upper and lower respiratory specimens during the acute phase of infection. The lowest concentration of SARS-CoV-2 viral copies this assay can detect is 250 copies / mL. A negative result does not preclude SARS-CoV-2 infection and should not be used as the sole basis for treatment or other patient management decisions.  A negative result may occur with improper specimen collection / handling, submission of specimen other than nasopharyngeal  swab, presence of viral mutation(s) within the areas targeted by this assay, and inadequate number of viral copies (<250 copies / mL). A negative result must be combined with clinical observations, patient history, and epidemiological information.  Fact Sheet for Patients:   RoadLapTop.co.za  Fact Sheet for Healthcare Providers: http://kim-miller.com/  This test is not yet approved or  cleared by the [place] FDA and has been authorized for detection and/or diagnosis  of SARS-CoV-2 by FDA under an Emergency Use Authorization (EUA).  This EUA will remain in effect (meaning this test can be used) for the duration of the COVID-19 declaration under Section 564(b)(1) of the Act, 21 U.S.C. section 360bbb-3(b)(1), unless the authorization is terminated or revoked sooner.  Performed at [facility], [address] [place], [place] [address]     Coagulation Studies: No results for input(s): "LABPROT", "INR" in the last 72 hours.  Urinalysis: No results for input(s): "COLORURINE", "LABSPEC", "PHURINE", "GLUCOSEU", "HGBUR", "BILIRUBINUR", "KETONESUR", "PROTEINUR", "UROBILINOGEN", "NITRITE", "LEUKOCYTESUR" in the last 72 hours.  Invalid input(s): "APPERANCEUR"    Imaging: No results found.   Assessment & Plan: Caitlyn Fuller is a 47 y.o.  female with past medical conditions including uncontrolled type 2 diabetes, hypertension, obesity, heart failure, polysubstance abuse, who was admitted to [facility] on 05/01/2022 for Anasarca [R60.1] Hypertensive urgency [I16.0] Acute on chronic systolic CHF (congestive heart failure) (HCC) [I50.23] Hypervolemia, unspecified hypervolemia type [E87.70] Uncontrolled type 2 diabetes mellitus with hyperglycemia (HCC) [E11.65] Acute on chronic heart failure with preserved ejection fraction (HFpEF) (HCC) [I50.33]  Chronic kidney disease stage II likely secondary to uncontrolled hypertension and  type 2 diabetes.  Baseline creatinine appears to be 0.88 with GFR 82 on 12/12/2021.  Renal ultrasound negative for obstruction or echogenicity.  Creatinine slightly elevated in setting of high-dose furosemide drip.  Adequate urine output noted yesterday.  Due to elevation in creatinine and BUN, will decrease furosemide drip to 4 mg/h.  2.  Acute on chronic diastolic heart failure with volume overload.  Echo from 05/08/2022 shows EF 60 to 65% with grade 2 diastolic dysfunction.  Patient currently prescribed furosemide drip with fluid restriction.  Cardiology following.  3.  Acute metabolic alkalosis, serum bicarb increased to 35.  Likely due to aggressive diuresis.  Will decrease furosemide drip as above.  4.  Hyponatremia/hypokalemia.  Oral supplementation of potassium ordered by primary team.  Decreased sodium likely due to diuresis.  Will monitor.  5. Anemia of chronic kidney disease Lab Results  Component Value Date   HGB 11.2 (L) 05/11/2022    Hemoglobin within desired range at this time.  Will continue to monitor.  6. 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 Home regimen includes NovoLog and Levemir. Most recent hemoglobin A1c is 14.3 on 05/02/22.   7.  Hypertension with chronic kidney disease.  Home regimen includes irbesartan and furosemide.  Currently receiving these medications.    LOS: 9 Pama Roskos 5/2/20243:37 PM

## 2022-05-12 DIAGNOSIS — I10 Essential (primary) hypertension: Secondary | ICD-10-CM | POA: Diagnosis not present

## 2022-05-12 DIAGNOSIS — I5023 Acute on chronic systolic (congestive) heart failure: Secondary | ICD-10-CM | POA: Diagnosis not present

## 2022-05-12 DIAGNOSIS — E1165 Type 2 diabetes mellitus with hyperglycemia: Secondary | ICD-10-CM | POA: Diagnosis not present

## 2022-05-12 DIAGNOSIS — I5033 Acute on chronic diastolic (congestive) heart failure: Secondary | ICD-10-CM | POA: Diagnosis not present

## 2022-05-12 LAB — GLUCOSE, CAPILLARY
Glucose-Capillary: 263 mg/dL — ABNORMAL HIGH (ref 70–99)
Glucose-Capillary: 271 mg/dL — ABNORMAL HIGH (ref 70–99)
Glucose-Capillary: 162 mg/dL — ABNORMAL HIGH (ref 70–99)
Glucose-Capillary: 232 mg/dL — ABNORMAL HIGH (ref 70–99)
Glucose-Capillary: 164 mg/dL — ABNORMAL HIGH (ref 70–99)

## 2022-05-12 LAB — CBC
HCT: 38.9 % (ref 36.0–46.0)
Hemoglobin: 12 g/dL (ref 12.0–15.0)
MCH: 25 pg — ABNORMAL LOW (ref 26.0–34.0)
MCHC: 30.8 g/dL (ref 30.0–36.0)
MCV: 81 fL (ref 80.0–100.0)
Platelets: 281 10*3/uL (ref 150–400)
RBC: 4.8 MIL/uL (ref 3.87–5.11)
RDW: 14.8 % (ref 11.5–15.5)
WBC: 11.8 10*3/uL — ABNORMAL HIGH (ref 4.0–10.5)
nRBC: 0 % (ref 0.0–0.2)

## 2022-05-12 LAB — BASIC METABOLIC PANEL
Anion gap: 10 (ref 5–15)
BUN: 64 mg/dL — ABNORMAL HIGH (ref 6–20)
CO2: 37 mmol/L — ABNORMAL HIGH (ref 22–32)
Calcium: 9.3 mg/dL (ref 8.9–10.3)
Chloride: 88 mmol/L — ABNORMAL LOW (ref 98–111)
Creatinine, Ser: 1.04 mg/dL — ABNORMAL HIGH (ref 0.44–1.00)
GFR, Estimated: >60 mL/min (ref >60)
Glucose, Bld: 243 mg/dL — ABNORMAL HIGH (ref 70–99)
Potassium: 4.2 mmol/L (ref 3.5–5.1)
Sodium: 135 mmol/L (ref 135–145)

## 2022-05-12 MED ORDER — INSULIN GLARGINE-YFGN 100 UNIT/ML ~~LOC~~ SOLN
80.0000 [IU] | Freq: Two times a day (BID) | SUBCUTANEOUS | Status: DC
  Administered 2022-05-12 – 2022-05-19 (×7): 80 [IU] via SUBCUTANEOUS
  Filled 2022-05-12 (×18): qty 0.8

## 2022-05-12 MED ORDER — OXYCODONE HCL 5 MG PO TABS
10.0000 mg | ORAL_TABLET | Freq: Four times a day (QID) | ORAL | Status: DC | PRN
  Administered 2022-05-12 – 2022-05-20 (×27): 10 mg via ORAL
  Filled 2022-05-12 (×29): qty 2

## 2022-05-12 NOTE — Progress Notes (Signed)
[facility] Progress Note  Patient: Caitlyn Fuller    GNF:[id number]  DOA: 05/01/2022     Date of Service: the patient was seen and examined on 05/12/2022  Chief Complaint  Patient presents with   Hyperglycemia   Brief hospital course: IREM AUTERY is a 47 y.o. female with medical history significant of HFpEF, insulin-dependent type 2 diabetes, hypertension, polysubstance abuse, chronic pain syndrome, morbid obesity, who presents to the ED with complaints of elevated blood sugar.  Patient is noncompliant with her medications  5/3: Vitals with elevated blood pressure at 157/83.  Renal function and alkalosis seems stable.  Most likely had pseudohyponatremia as CBG remained elevated.  No urinary output recorded but according to patient she is having good urinary output.  Nephrology is on board.  Volume status improving but still appears volume up.  Remained on Lasix infusion. Patient was on oxycodone and Suboxone both.  Became little irritable when asked why she is taking both, stating that she was taking Suboxone for other reasons.  She was requesting to increase the dose of oxycodone due to uncontrolled pain, stating that she was taking 15 mg at home and here she was getting only 5 mg.  Dose increased to 10 mg.  Need to discontinue Suboxone if she remained on oxycodone.  Assessment and Plan:  # Hyperglycemia, uncontrolled diabetes mellitus due to noncompliance IDDM T2 5/3: Increase Semglee to 80 units twice daily and continue rest 4/28 increased  Semglee 75 units twice daily 4/27 Increased NovoLog 25 units 3 times daily and sliding scale 4/26 NovoLog 10 units IV given due to high blood sugar Monitor CBG, continue diabetic diet Diabetic coordinator consulted for further management and counseling   # Anasarca, diastolic CHF patient has significant bilateral lower extremity edema S/p Lasix IV 40 daily 4/24 started Lasix 4 mg/hr  IV infusion  4/28 increased Lasix 8 mg/hr IV  infusion due to no improvement in lower extremity edema 4/30 s/p metolazone 5 mg p.o. daily for 2 days 5/1 increase Lasix 10 mg/h IV infusion D-dimer negative venous duplex negative for DVT US renal: The kidneys are unremarkable. A large postvoid residual of 108 cc is identified. The bladder is otherwise unremarkable. Monitor intake output and renal functions, continue fluid restriction 1.5 L/day Nephrology consulted, 5/2 decrease Lasix 4 mg/h, discontinued metolazone due to contraction alkalosis 5/2 albumin 12.5 g IV every 6 hourly for 2 days 5/3: Still having significant lower extremity edema but overall improving.  Remained on Lasix infusion  # Accelerated hypertension  Uncontrolled blood pressure, BP 197/66 on admission Continue irbesartan 75 mg p.o. daily home dose Use hydralazine p.o. or IV as needed as per parameters S/p Lasix 40 mg IV daily Monitor BP and titrate medications accordingly 4/24 --4/28 s/p Lopressor 50 mg p.o. BID, discontinued, patient was feeling dizziness after taking medication.   4/28 started Coreg 6.25 mg p.o. twice daily 4/29 blood pressures soft now, patient refused coreg last night, so discontinued 5/3: Blood pressure remained mildly elevated.  We will continue irbesartan and monitor  # Chronic pain Patient endorses chronic pain for which she states she was previously on Percocet.  PDMP reviewed and last prescription for oxycodone was in November 2023. - Conservative management and recommend outpatient follow-up with PCP 4/28 patient stated no allergy to Tylenol, so started Tylenol 650 mg p.o. every 6 hourly as needed 4/29 started oxycodone 5 mg p.o. every 6 hours as needed 5/2: Oxycodone dose increased to 10 mg p.o. every 6 hourly  as needed.  Suboxone needs to be discontinued, patient became very irritable stating that she is using Suboxone for other reasons.  # Polysubstance abuse - Continue home Suboxone  # Sore throat Patient states that she  developed perioral erythema and lesions on her tongue after taking a new blood pressure medicine last night.  At this time, she continues to have a sore throat.  On exam, she has some mild erythema, but no edema. - Supportive management  # Obesity Body mass index is 46.89 kg/m.  Interventions: Calorie restricted diet and daily exercise advised to lose body weight.  Lifestyle modification discussed.   # Vitamin D deficiency: started vitamin D 50,000 units p.o. weekly, follow with PCP to repeat vitamin D level after 3 to 6 months. #Vitamin B12 level 205 at lower end, goal >400, started vitamin B12 1000 mcg IM injection daily for 7 days during hospital stay followed by oral supplement at discharge.  Follow with PCP to repeat B12 level after 3 to 6 months.     Diet: Diabetic diet, fluid restriction 1.5 L/day DVT Prophylaxis: Subcutaneous Lovenox   Advance goals of care discussion: Full code  Family Communication: Discussed with patient  Disposition:  Pt is from Home, admitted with diabetes,  accelerated blood pressure, and anasarca, still has lower extremity edema, on IV Lasix infusion, which precludes a safe discharge. Discharge to home, when clinically stable.  Subjective: Patient was complaining of a lot of pain in her legs and back and requesting to increase her dose of oxycodone so she can move around.  Per patient she was on 15 mg of oxycodone before.  Physical Exam: General.  Morbidly obese lady, in no acute distress. Pulmonary.  Lungs clear bilaterally, normal respiratory effort. CV.  Regular rate and rhythm, no JVD, rub or murmur. Abdomen.  Soft, nontender, nondistended, BS positive. CNS.  Alert and oriented .  No focal neurologic deficit. Extremities.  2+ LE edema, no cyanosis, pulses intact and symmetrical. Psychiatry.  Judgment and insight appears normal.   Vitals:   05/12/22 0337 05/12/22 0343 05/12/22 0812 05/12/22 1135  BP:  (!) 166/81 (!) 157/83 (!) 145/93  Pulse:  82  77 99  Resp:  18 16 16   Temp:  97.6 F (36.4 C) 97.7 F (36.5 C) 98 F (36.7 C)  TempSrc:      SpO2:  95% 99% 97%  Weight: 119.6 kg       Intake/Output Summary (Last 24 hours) at 05/12/2022 1513 Last data filed at 05/12/2022 1139 Gross per 24 hour  Intake 740.22 ml  Output 2400 ml  Net -1659.78 ml    Filed Weights   05/10/22 0430 05/11/22 0535 05/12/22 0337  Weight: 118.4 kg 116.3 kg 119.6 kg    Data Reviewed: I have personally reviewed and interpreted daily labs, tele strips, imagings as discussed above. I reviewed all nursing notes, pharmacy notes, vitals, pertinent old records I have discussed plan of care as described above with RN and patient/family.  CBC: Recent Labs  Lab 05/08/22 0526 05/09/22 0539 05/10/22 0334 05/11/22 0711 05/12/22 0524  WBC 11.2* 9.7 12.2* 11.9* 11.8*  HGB 11.5* 11.4* 11.8* 11.2* 12.0  HCT 37.8 37.6 38.7 35.7* 38.9  MCV 80.9 81.6 81.6 79.5* 81.0  PLT 227 240 260 256 281    Basic Metabolic Panel: Recent Labs  Lab 05/07/22 0442 05/08/22 0526 05/09/22 0539 05/10/22 0334 05/11/22 0711 05/12/22 0524  NA 129* 135 134* 134* 131* 135  K 4.6 3.7 3.6 4.0 3.0*  4.2  CL 93* 94* 93* 92* 85* 88*  CO2 28 32 33* 35* 35* 37*  GLUCOSE 352* 202* 159* 236* 206* 243*  BUN 50* 49* 44* 42* 52* 64*  CREATININE 0.98 0.98 0.93 0.87 1.02* 1.04*  CALCIUM 8.3* 8.4* 8.2* 8.2* 8.4* 9.3  MG 2.0  --   --   --   --   --   PHOS 5.2*  --   --   --   --   --      Studies: No results found.  Scheduled Meds:  acetaminophen  650 mg Oral Q8H   aspirin EC  81 mg Oral Daily   atorvastatin  10 mg Oral Daily   buprenorphine-naloxone  1 tablet Sublingual TID   cyanocobalamin  1,000 mcg Intramuscular Daily   Followed by   Melene Muller ON 05/14/2022] vitamin B-12  1,000 mcg Oral Daily   enoxaparin (LOVENOX) injection  0.5 mg/kg Subcutaneous Q24H   insulin aspart  0-20 Units Subcutaneous TID AC & HS   insulin aspart  25 Units Subcutaneous TID WC   insulin glargine-yfgn   75 Units Subcutaneous BID   irbesartan  75 mg Oral Daily   sodium chloride flush  3 mL Intravenous Q12H   Vitamin D (Ergocalciferol)  50,000 Units Oral Q7 days   Continuous Infusions:  sodium chloride     albumin human 12.5 g (05/12/22 1134)   furosemide (LASIX) 200 mg in dextrose 5 % 100 mL (2 mg/mL) infusion 4 mg/hr (05/12/22 0634)   PRN Meds: sodium chloride, acetaminophen, diclofenac Sodium, fluticasone, [DISCONTINUED] hydrALAZINE **OR** hydrALAZINE, hydrOXYzine, ondansetron (ZOFRAN) IV, oxyCODONE, sodium chloride flush  Time spent: 50 minutes  Author: Arnetha Courser. MD [facility] 05/12/2022 3:13 PM  To reach On-call, see care teams to locate the attending and reach out to them via [email or web address]. If 7PM-7AM, please contact night-coverage If you still have difficulty reaching the attending provider, please page the [facility] (Director on Call) for [facility] on amion for assistance.

## 2022-05-12 NOTE — Inpatient Diabetes Management (Addendum)
Inpatient Diabetes Program Recommendations  AACE/ADA: New Consensus Statement on Inpatient Glycemic Control (2015)  Target Ranges:  Prepandial:   less than 140 mg/dL      Peak postprandial:   less than 180 mg/dL (1-2 hours)      Critically ill patients:  140 - 180 mg/dL    Latest Reference Range & Units 05/11/22 08:07 05/11/22 11:45 05/11/22 15:30 05/11/22 20:48 05/11/22 21:10  Glucose-Capillary 70 - 99 mg/dL 161 (H) 096 (H)  32 units Novolog @1311   75 units Semglee @1010  232 (H)  32 units Novolog @1725  154 (H) 119 (H)   Patient REFUSED Semglee at Bedtime    Latest Reference Range & Units 05/12/22 08:13 05/12/22 11:36  Glucose-Capillary 70 - 99 mg/dL 045 (H) 409 (H)  32 units Novolog @1113   75 units Semglee @1109   (H): Data is abnormally high  Home DM Meds: Levemir 50 units QHS       Novolog 15 units TID    Current Orders: Semglee 75 units BID                           Novolog Resistant Correction Scale/ SSI (0-20 units) TID AC + HS     Novolog 25 units TID with meals    MD- Note CBG elevated this AM.    Please note that pt refused her Semglee dose last PM  Please also note 8am Novolog doses NOT given until 11am today    --Will follow patient during hospitalization--  Ambrose Finland RN, MSN, CDCES Diabetes Coordinator Inpatient Glycemic Control Team Team Pager: [phone] (8a-5p)

## 2022-05-12 NOTE — Progress Notes (Signed)
[facility]  ROUNDING NOTE   Subjective:   Patient sitting up in bed Alert and oriented States back and abd pain has decreased.  Edema remains Patient reports adequate urine output  Objective:  Vital signs in last 24 hours:  Temp:  [97.6 F (36.4 C)-98.7 F (37.1 C)] 98 F (36.7 C) (05/03 1135) Pulse Rate:  [77-99] 99 (05/03 1135) Resp:  [16-18] 16 (05/03 1135) BP: (120-193)/(71-93) 145/93 (05/03 1135) SpO2:  [89 %-99 %] 97 % (05/03 1135) Weight:  [119.6 kg] 119.6 kg (05/03 0337)  Weight change: 3.3 kg Filed Weights   05/10/22 0430 05/11/22 0535 05/12/22 0337  Weight: 118.4 kg 116.3 kg 119.6 kg    Intake/Output: I/O last 3 completed shifts: In: 432.8 [P.O.:120; I.V.:156.9; IV Piggyback:155.9] Out: 800 [Urine:800]   Intake/Output this shift:  Total I/O In: 480 [P.O.:480] Out: 1600 [Urine:1600]  Physical Exam: General: NAD  Head: Normocephalic, atraumatic. Moist oral mucosal membranes  Eyes: Anicteric  Lungs:  Clear to auscultation, normal effort  Heart: Regular rate and rhythm  Abdomen:  Soft, nontender  Extremities:  3+ peripheral edema  Neurologic: Alert and oriented, moving all four extremities  Skin: No lesions  Access: None    Basic Metabolic Panel: Recent Labs  Lab 05/07/22 0442 05/08/22 0526 05/09/22 0539 05/10/22 0334 05/11/22 0711 05/12/22 0524  NA 129* 135 134* 134* 131* 135  K 4.6 3.7 3.6 4.0 3.0* 4.2  CL 93* 94* 93* 92* 85* 88*  CO2 28 32 33* 35* 35* 37*  GLUCOSE 352* 202* 159* 236* 206* 243*  BUN 50* 49* 44* 42* 52* 64*  CREATININE 0.98 0.98 0.93 0.87 1.02* 1.04*  CALCIUM 8.3* 8.4* 8.2* 8.2* 8.4* 9.3  MG 2.0  --   --   --   --   --   PHOS 5.2*  --   --   --   --   --     Liver Function Tests: No results for input(s): "AST", "ALT", "ALKPHOS", "BILITOT", "PROT", "ALBUMIN" in the last 168 hours. No results for input(s): "LIPASE", "AMYLASE" in the last 168 hours. No results for input(s): "AMMONIA" in the last 168  hours.  CBC: Recent Labs  Lab 05/08/22 0526 05/09/22 0539 05/10/22 0334 05/11/22 0711 05/12/22 0524  WBC 11.2* 9.7 12.2* 11.9* 11.8*  HGB 11.5* 11.4* 11.8* 11.2* 12.0  HCT 37.8 37.6 38.7 35.7* 38.9  MCV 80.9 81.6 81.6 79.5* 81.0  PLT 227 240 260 256 281    Cardiac Enzymes: No results for input(s): "CKTOTAL", "CKMB", "CKMBINDEX", "TROPONINI" in the last 168 hours.  BNP: Invalid input(s): "POCBNP"  CBG: Recent Labs  Lab 05/11/22 1530 05/11/22 2048 05/11/22 2110 05/12/22 0813 05/12/22 1136  GLUCAP 232* 154* 119* 263* 271*    Microbiology: Results for orders placed or performed during the hospital encounter of 11/16/21  SARS Coronavirus 2 by RT PCR (hospital order, performed in [facility] hospital lab) *cepheid single result test* Anterior Nasal Swab     Status: None   Collection Time: 11/16/21  5:13 PM   Specimen: Anterior Nasal Swab  Result Value Ref Range Status   SARS Coronavirus 2 by RT PCR NEGATIVE NEGATIVE Final    Comment: (NOTE) SARS-CoV-2 target nucleic acids are NOT DETECTED.  The SARS-CoV-2 RNA is generally detectable in upper and lower respiratory specimens during the acute phase of infection. The lowest concentration of SARS-CoV-2 viral copies this assay can detect is 250 copies / mL. A negative result does not preclude SARS-CoV-2 infection and should  not be used as the sole basis for treatment or other patient management decisions.  A negative result may occur with improper specimen collection / handling, submission of specimen other than nasopharyngeal swab, presence of viral mutation(s) within the areas targeted by this assay, and inadequate number of viral copies (<250 copies / mL). A negative result must be combined with clinical observations, patient history, and epidemiological information.  Fact Sheet for Patients:   RoadLapTop.co.za  Fact Sheet for Healthcare  Providers: http://kim-miller.com/  This test is not yet approved or  cleared by the [place] FDA and has been authorized for detection and/or diagnosis of SARS-CoV-2 by FDA under an Emergency Use Authorization (EUA).  This EUA will remain in effect (meaning this test can be used) for the duration of the COVID-19 declaration under Section 564(b)(1) of the Act, 21 U.S.C. section 360bbb-3(b)(1), unless the authorization is terminated or revoked sooner.  Performed at [facility], [address] [place], [place] [address]     Coagulation Studies: No results for input(s): "LABPROT", "INR" in the last 72 hours.  Urinalysis: No results for input(s): "COLORURINE", "LABSPEC", "PHURINE", "GLUCOSEU", "HGBUR", "BILIRUBINUR", "KETONESUR", "PROTEINUR", "UROBILINOGEN", "NITRITE", "LEUKOCYTESUR" in the last 72 hours.  Invalid input(s): "APPERANCEUR"    Imaging: No results found.   Medications:    sodium chloride     albumin human 12.5 g (05/12/22 1134)   furosemide (LASIX) 200 mg in dextrose 5 % 100 mL (2 mg/mL) infusion 4 mg/hr (05/12/22 6045)    acetaminophen  650 mg Oral Q8H   aspirin EC  81 mg Oral Daily   atorvastatin  10 mg Oral Daily   buprenorphine-naloxone  1 tablet Sublingual TID   cyanocobalamin  1,000 mcg Intramuscular Daily   Followed by   Melene Muller ON 05/14/2022] vitamin B-12  1,000 mcg Oral Daily   enoxaparin (LOVENOX) injection  0.5 mg/kg Subcutaneous Q24H   insulin aspart  0-20 Units Subcutaneous TID AC & HS   insulin aspart  25 Units Subcutaneous TID WC   insulin glargine-yfgn  75 Units Subcutaneous BID   irbesartan  75 mg Oral Daily   sodium chloride flush  3 mL Intravenous Q12H   Vitamin D (Ergocalciferol)  50,000 Units Oral Q7 days   sodium chloride, acetaminophen, diclofenac Sodium, fluticasone, [DISCONTINUED] hydrALAZINE **OR** hydrALAZINE, hydrOXYzine, ondansetron (ZOFRAN) IV, oxyCODONE, sodium chloride flush  Assessment/ Plan:   Ms. Caitlyn Fuller is a 47 y.o.  female  with past medical conditions including uncontrolled type 2 diabetes, hypertension, obesity, heart failure, polysubstance abuse, who was admitted to [facility] on 05/01/2022 for Anasarca [R60.1] Hypertensive urgency [I16.0] Acute on chronic systolic CHF (congestive heart failure) (HCC) [I50.23] Hypervolemia, unspecified hypervolemia type [E87.70] Uncontrolled type 2 diabetes mellitus with hyperglycemia (HCC) [E11.65] Acute on chronic heart failure with preserved ejection fraction (HFpEF) (HCC) [I50.33]   Chronic kidney disease stage II likely secondary to uncontrolled hypertension and type 2 diabetes.  Baseline creatinine appears to be 0.88 with GFR 82 on 12/12/2021.  Renal ultrasound negative for obstruction or echogenicity.  Creatinine slightly elevated in setting of high-dose furosemide drip.   Creatinine remains stable, tolerating Furosemide drip at decreased rate.   2. Acute on chronic diastolic heart failure with volume overload.  Echo from 05/08/2022 shows EF 60 to 65% with grade 2 diastolic dysfunction.  Continue furosemide drip with fluid restriction.  Cardiology following.   3.  Acute metabolic alkalosis, serum bicarb increased to 35.  Likely due to aggressive diuresis.  Stable today   4.  Hyponatremia/hypokalemia.  Oral supplementation by primary team yesterday.  Decreased sodium likely due to diuresis.  Will monitor.  5. Anemia of chronic kidney disease Lab Results  Component Value Date   HGB 12.0 05/12/2022    Hgb within desired range.   6. 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 Home regimen includes NovoLog and Levemir. Most recent hemoglobin A1c is 14.3 on 05/02/22.   Glucose elevated   7.  Hypertension with chronic kidney disease.  Home regimen includes irbesartan and furosemide.  Currently receiving these medications.    LOS: 10 Tahani Potier 5/3/20241:36 PM

## 2022-05-13 DIAGNOSIS — E1165 Type 2 diabetes mellitus with hyperglycemia: Secondary | ICD-10-CM | POA: Diagnosis not present

## 2022-05-13 DIAGNOSIS — I5033 Acute on chronic diastolic (congestive) heart failure: Secondary | ICD-10-CM | POA: Diagnosis not present

## 2022-05-13 DIAGNOSIS — I10 Essential (primary) hypertension: Secondary | ICD-10-CM | POA: Diagnosis not present

## 2022-05-13 DIAGNOSIS — I5023 Acute on chronic systolic (congestive) heart failure: Secondary | ICD-10-CM | POA: Diagnosis not present

## 2022-05-13 LAB — GLUCOSE, CAPILLARY
Glucose-Capillary: 209 mg/dL — ABNORMAL HIGH (ref 70–99)
Glucose-Capillary: 159 mg/dL — ABNORMAL HIGH (ref 70–99)
Glucose-Capillary: 164 mg/dL — ABNORMAL HIGH (ref 70–99)
Glucose-Capillary: 134 mg/dL — ABNORMAL HIGH (ref 70–99)
Glucose-Capillary: 181 mg/dL — ABNORMAL HIGH (ref 70–99)

## 2022-05-13 LAB — BASIC METABOLIC PANEL
Anion gap: 8 (ref 5–15)
BUN: 63 mg/dL — ABNORMAL HIGH (ref 6–20)
CO2: 35 mmol/L — ABNORMAL HIGH (ref 22–32)
Calcium: 9.1 mg/dL (ref 8.9–10.3)
Chloride: 90 mmol/L — ABNORMAL LOW (ref 98–111)
Creatinine, Ser: 1.19 mg/dL — ABNORMAL HIGH (ref 0.44–1.00)
GFR, Estimated: 57 mL/min — ABNORMAL LOW (ref >60)
Glucose, Bld: 271 mg/dL — ABNORMAL HIGH (ref 70–99)
Potassium: 4.2 mmol/L (ref 3.5–5.1)
Sodium: 133 mmol/L — ABNORMAL LOW (ref 135–145)

## 2022-05-13 MED ORDER — TORSEMIDE 20 MG PO TABS
40.0000 mg | ORAL_TABLET | Freq: Every day | ORAL | Status: DC
  Administered 2022-05-14: 40 mg via ORAL
  Filled 2022-05-13: qty 2

## 2022-05-13 MED ORDER — METOLAZONE 5 MG PO TABS
5.0000 mg | ORAL_TABLET | Freq: Once | ORAL | Status: AC
  Administered 2022-05-13: 5 mg via ORAL
  Filled 2022-05-13: qty 1

## 2022-05-13 MED ORDER — FUROSEMIDE 10 MG/ML IJ SOLN
40.0000 mg | Freq: Once | INTRAMUSCULAR | Status: AC
  Administered 2022-05-13: 40 mg via INTRAVENOUS
  Filled 2022-05-13: qty 4

## 2022-05-13 MED ORDER — SPIRONOLACTONE 25 MG PO TABS
25.0000 mg | ORAL_TABLET | Freq: Every day | ORAL | Status: DC
  Administered 2022-05-13 – 2022-05-18 (×6): 25 mg via ORAL
  Filled 2022-05-13 (×6): qty 1

## 2022-05-13 NOTE — Progress Notes (Signed)
[facility]  ROUNDING NOTE   Subjective:   Patient seen ambulating from bathroom Continues to complain of abd pain Improved lower extremity edema  Objective:  Vital signs in last 24 hours:  Temp:  [97.7 F (36.5 C)-98.5 F (36.9 C)] 98 F (36.7 C) (05/04 0744) Pulse Rate:  [86-99] 88 (05/04 0744) Resp:  [16-18] 16 (05/04 0744) BP: (132-158)/(73-93) 134/85 (05/04 0744) SpO2:  [96 %-100 %] 100 % (05/04 0744) Weight:  [116.9 kg] 116.9 kg (05/04 0326)  Weight change: -2.663 kg Filed Weights   05/11/22 0535 05/12/22 0337 05/13/22 0326  Weight: 116.3 kg 119.6 kg 116.9 kg    Intake/Output: I/O last 3 completed shifts: In: 1119.2 [P.O.:960; I.V.:40.1; IV Piggyback:119.1] Out: 2400 [Urine:2400]   Intake/Output this shift:  No intake/output data recorded.  Physical Exam: General: NAD  Head: Normocephalic, atraumatic. Moist oral mucosal membranes  Eyes: Anicteric  Lungs:  Clear to auscultation, normal effort  Heart: Regular rate and rhythm  Abdomen:  Soft, nontender  Extremities:  2+ peripheral edema  Neurologic: Alert and oriented, moving all four extremities  Skin: No lesions  Access: None    Basic Metabolic Panel: Recent Labs  Lab 05/07/22 0442 05/08/22 0526 05/09/22 0539 05/10/22 0334 05/11/22 0711 05/12/22 0524 05/13/22 0422  NA 129*   < > 134* 134* 131* 135 133*  K 4.6   < > 3.6 4.0 3.0* 4.2 4.2  CL 93*   < > 93* 92* 85* 88* 90*  CO2 28   < > 33* 35* 35* 37* 35*  GLUCOSE 352*   < > 159* 236* 206* 243* 271*  BUN 50*   < > 44* 42* 52* 64* 63*  CREATININE 0.98   < > 0.93 0.87 1.02* 1.04* 1.19*  CALCIUM 8.3*   < > 8.2* 8.2* 8.4* 9.3 9.1  MG 2.0  --   --   --   --   --   --   PHOS 5.2*  --   --   --   --   --   --    < > = values in this interval not displayed.     Liver Function Tests: No results for input(s): "AST", "ALT", "ALKPHOS", "BILITOT", "PROT", "ALBUMIN" in the last 168 hours. No results for input(s): "LIPASE", "AMYLASE" in the  last 168 hours. No results for input(s): "AMMONIA" in the last 168 hours.  CBC: Recent Labs  Lab 05/08/22 0526 05/09/22 0539 05/10/22 0334 05/11/22 0711 05/12/22 0524  WBC 11.2* 9.7 12.2* 11.9* 11.8*  HGB 11.5* 11.4* 11.8* 11.2* 12.0  HCT 37.8 37.6 38.7 35.7* 38.9  MCV 80.9 81.6 81.6 79.5* 81.0  PLT 227 240 260 256 281     Cardiac Enzymes: No results for input(s): "CKTOTAL", "CKMB", "CKMBINDEX", "TROPONINI" in the last 168 hours.  BNP: Invalid input(s): "POCBNP"  CBG: Recent Labs  Lab 05/12/22 1136 05/12/22 1608 05/12/22 1819 05/12/22 2119 05/13/22 0744  GLUCAP 271* 162* 232* 164* 209*     Microbiology: Results for orders placed or performed during the hospital encounter of 11/16/21  SARS Coronavirus 2 by RT PCR (hospital order, performed in [facility] hospital lab) *cepheid single result test* Anterior Nasal Swab     Status: None   Collection Time: 11/16/21  5:13 PM   Specimen: Anterior Nasal Swab  Result Value Ref Range Status   SARS Coronavirus 2 by RT PCR NEGATIVE NEGATIVE Final    Comment: (NOTE) SARS-CoV-2 target nucleic acids are NOT DETECTED.  The SARS-CoV-2  RNA is generally detectable in upper and lower respiratory specimens during the acute phase of infection. The lowest concentration of SARS-CoV-2 viral copies this assay can detect is 250 copies / mL. A negative result does not preclude SARS-CoV-2 infection and should not be used as the sole basis for treatment or other patient management decisions.  A negative result may occur with improper specimen collection / handling, submission of specimen other than nasopharyngeal swab, presence of viral mutation(s) within the areas targeted by this assay, and inadequate number of viral copies (<250 copies / mL). A negative result must be combined with clinical observations, patient history, and epidemiological information.  Fact Sheet for Patients:   RoadLapTop.co.za  Fact  Sheet for Healthcare Providers: http://kim-miller.com/  This test is not yet approved or  cleared by the [place] FDA and has been authorized for detection and/or diagnosis of SARS-CoV-2 by FDA under an Emergency Use Authorization (EUA).  This EUA will remain in effect (meaning this test can be used) for the duration of the COVID-19 declaration under Section 564(b)(1) of the Act, 21 U.S.C. section 360bbb-3(b)(1), unless the authorization is terminated or revoked sooner.  Performed at [facility], [address] [place], [place] [address]     Coagulation Studies: No results for input(s): "LABPROT", "INR" in the last 72 hours.  Urinalysis: No results for input(s): "COLORURINE", "LABSPEC", "PHURINE", "GLUCOSEU", "HGBUR", "BILIRUBINUR", "KETONESUR", "PROTEINUR", "UROBILINOGEN", "NITRITE", "LEUKOCYTESUR" in the last 72 hours.  Invalid input(s): "APPERANCEUR"    Imaging: No results found.   Medications:    sodium chloride     furosemide (LASIX) 200 mg in dextrose 5 % 100 mL (2 mg/mL) infusion 4 mg/hr (05/13/22 0056)    acetaminophen  650 mg Oral Q8H   aspirin EC  81 mg Oral Daily   atorvastatin  10 mg Oral Daily   buprenorphine-naloxone  1 tablet Sublingual TID   cyanocobalamin  1,000 mcg Intramuscular Daily   Followed by   Melene Muller ON 05/14/2022] vitamin B-12  1,000 mcg Oral Daily   enoxaparin (LOVENOX) injection  0.5 mg/kg Subcutaneous Q24H   insulin aspart  0-20 Units Subcutaneous TID AC & HS   insulin aspart  25 Units Subcutaneous TID WC   insulin glargine-yfgn  80 Units Subcutaneous BID   irbesartan  75 mg Oral Daily   sodium chloride flush  3 mL Intravenous Q12H   Vitamin D (Ergocalciferol)  50,000 Units Oral Q7 days   sodium chloride, acetaminophen, diclofenac Sodium, fluticasone, [DISCONTINUED] hydrALAZINE **OR** hydrALAZINE, hydrOXYzine, ondansetron (ZOFRAN) IV, oxyCODONE, sodium chloride flush  Assessment/ Plan:  Ms. Caitlyn Fuller is a 47 y.o.  female  with past medical conditions including uncontrolled type 2 diabetes, hypertension, obesity, heart failure, polysubstance abuse, who was admitted to [facility] on 05/01/2022 for Anasarca [R60.1] Hypertensive urgency [I16.0] Acute on chronic systolic CHF (congestive heart failure) (HCC) [I50.23] Hypervolemia, unspecified hypervolemia type [E87.70] Uncontrolled type 2 diabetes mellitus with hyperglycemia (HCC) [E11.65] Acute on chronic heart failure with preserved ejection fraction (HFpEF) (HCC) [I50.33]   Chronic kidney disease stage II likely secondary to uncontrolled hypertension and type 2 diabetes.  Baseline creatinine appears to be 0.88 with GFR 82 on 12/12/2021.  Renal ultrasound negative for obstruction or echogenicity.  Creatinine slightly elevated in setting of high-dose furosemide drip.   Creatinine remains stable. Urine output 1.6L recorded on day shift yesterday.   2. Acute on chronic diastolic heart failure with volume overload.  Echo from 05/08/2022 shows EF 60 to 65% with grade 2  diastolic dysfunction.  Cardiology following. Continue furosemide drip with fluid restriction.     3.  Acute metabolic alkalosis, serum bicarb increased to 35.  Likely due to aggressive diuresis.  Will continue to monitor.    4.  Hyponatremia/hypokalemia.  Oral supplementation by primary team yesterday.  Decreased sodium likely due to diuresis.  Will monitor.  5. Anemia of chronic kidney disease Lab Results  Component Value Date   HGB 12.0 05/12/2022    Hgb within desired range.   6. 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 Home regimen includes NovoLog and Levemir. Most recent hemoglobin A1c is 14.3 on 05/02/22.   Glucose elevated at times. Primary team to manage SSI   7.  Hypertension with chronic kidney disease.  Home regimen includes irbesartan and furosemide.  Currently receiving these medications.    LOS:  11 Caitlyn Fuller 5/4/202410:04 AM

## 2022-05-13 NOTE — Progress Notes (Signed)
[facility] Progress Note  Patient: Caitlyn Fuller    ZOX:[id number]  DOA: 05/01/2022     Date of Service: the patient was seen and examined on 05/13/2022  Chief Complaint  Patient presents with   Hyperglycemia   Brief hospital course: Caitlyn Fuller is a 47 y.o. female with medical history significant of HFpEF, insulin-dependent type 2 diabetes, hypertension, polysubstance abuse, chronic pain syndrome, morbid obesity, who presents to the ED with complaints of elevated blood sugar.  Patient is noncompliant with her medications  5/3: Vitals with elevated blood pressure at 157/83.  Renal function and alkalosis seems stable.  Most likely had pseudohyponatremia as CBG remained elevated.  No urinary output recorded but according to patient she is having good urinary output.  Nephrology is on board.  Volume status improving but still appears volume up.  Remained on Lasix infusion. Patient was on oxycodone and Suboxone both.  Became little irritable when asked why she is taking both, stating that she was taking Suboxone for other reasons.  She was requesting to increase the dose of oxycodone due to uncontrolled pain, stating that she was taking 15 mg at home and here she was getting only 5 mg.  Dose increased to 10 mg.  Need to discontinue Suboxone if she remained on oxycodone.  5/4: Vital stable.  Slight worsening of creatinine so Lasix infusion was discontinued.  Patient became very upset and rude when told that she might be going home soon.  Continue to have significant lower extremity edema.  Patient is being noncompliant with her fluid restrictions while in the hospital, apparently family members are bringing different drinks.  She was not saving most of her urine so no strict intake and output recorded.  She was very threatening with the potential discharge and rudely saying that she wanted a different physician and second opinion and asking me to get out of the room.  Charge nurse  notified.   Cardiology was also consulted-she is a known to the practice and never showed up for her follow-up appointments. Cardiology is recommending trying 1 dose of IV Lasix with metolazone with close monitoring of renal function for 1 day.  Assessment and Plan:  # Hyperglycemia, uncontrolled diabetes mellitus due to noncompliance IDDM T2 5/3: Increase Semglee to 80 units twice daily and continue rest 4/28 increased  Semglee 75 units twice daily 4/27 Increased NovoLog 25 units 3 times daily and sliding scale 4/26 NovoLog 10 units IV given due to high blood sugar Monitor CBG, continue diabetic diet Diabetic coordinator consulted for further management and counseling   # Anasarca, diastolic CHF patient has significant bilateral lower extremity edema D-dimer negative, venous duplex negative for DVT US renal: The kidneys are unremarkable. A large postvoid residual of 108 cc is identified. The bladder is otherwise unremarkable. Monitor intake output and renal functions, continue fluid restriction 1.5 L/day-patient is noncompliant She was initially started on IV Lasix followed by Lasix infusion.  Did develop contraction alkalosis due to high rates and nephrology was consulted.  Rate was decreased.  Per patient she is peeing good but not providing all the urine for measurement.  Not following the directions for fluid restriction. Patient remained on Lasix infusion for many days and discontinued today due to mild hyponatremia and some worsening of creatinine. Cardiology was also consulted due to her threatening behavior. They ordered 1 dose of IV Lasix with metolazone. Apparently she was noncompliant and not following as outpatient. -Continue with strict intake and output if she  cooperates. -Daily weight and BMP -Will decide about more IV Lasix tomorrow, -Spironolactone was also added  # Accelerated hypertension  Uncontrolled blood pressure, BP 197/66 on admission Continue irbesartan 75  mg p.o. daily home dose Use hydralazine p.o. or IV as needed as per parameters S/p Lasix 40 mg IV daily Monitor BP and titrate medications accordingly 4/24 --4/28 s/p Lopressor 50 mg p.o. BID, discontinued, patient was feeling dizziness after taking medication.   4/28 started Coreg 6.25 mg p.o. twice daily 4/29 blood pressures soft now, patient refused coreg last night, so discontinued 5/3: Blood pressure remained mildly elevated.  We will continue irbesartan and monitor  # Chronic pain Patient endorses chronic pain for which she states she was previously on Percocet.  PDMP reviewed and last prescription for oxycodone was in November 2023. - Conservative management and recommend outpatient follow-up with PCP 4/28 patient stated no allergy to Tylenol, so started Tylenol 650 mg p.o. every 6 hourly as needed 4/29 started oxycodone 5 mg p.o. every 6 hours as needed 5/2: Oxycodone dose increased to 10 mg p.o. every 6 hourly as needed.  Suboxone needs to be discontinued, patient became very irritable stating that she is using Suboxone for other reasons.  # Polysubstance abuse - Continue home Suboxone  # Sore throat Patient states that she developed perioral erythema and lesions on her tongue after taking a new blood pressure medicine last night.  At this time, she continues to have a sore throat.  On exam, she has some mild erythema, but no edema. - Supportive management  # Obesity Body mass index is 46.89 kg/m.  Interventions: Calorie restricted diet and daily exercise advised to lose body weight.  Lifestyle modification discussed.   # Vitamin D deficiency: started vitamin D 50,000 units p.o. weekly, follow with PCP to repeat vitamin D level after 3 to 6 months. #Vitamin B12 level 205 at lower end, goal >400, started vitamin B12 1000 mcg IM injection daily for 7 days during hospital stay followed by oral supplement at discharge.  Follow with PCP to repeat B12 level after 3 to 6 months.      Diet: Diabetic diet, fluid restriction 1.5 L/day DVT Prophylaxis: Subcutaneous Lovenox   Advance goals of care discussion: Full code  Family Communication: Discussed with patient  Disposition:  Pt is from Home, admitted with diabetes,  accelerated blood pressure, and anasarca, still has lower extremity edema, on IV Lasix infusion, which precludes a safe discharge. Discharge to home, when clinically stable.  Subjective: Patient got very upset when told that we are stopping Lasix infusion due to slight worsening of renal function and she has to stay compliant with outpatient medication and will be ready for discharge soon, stating that she still is volume overload and will not leave the hospital until that issue completely resolved.  Physical Exam: General.  Morbidly obese lady, in no acute distress. Pulmonary.  Lungs clear bilaterally, normal respiratory effort. CV.  Regular rate and rhythm, no JVD, rub or murmur. Abdomen.  Soft, nontender, nondistended, BS positive. CNS.  Alert and oriented .  No focal neurologic deficit. Extremities.  2+ LE edema, no cyanosis, pulses intact and symmetrical. Psychiatry.  Judgment and insight appears normal.   Vitals:   05/12/22 2323 05/13/22 0326 05/13/22 0744 05/13/22 1136  BP: 133/73 (!) 143/87 134/85 130/66  Pulse: 94 88 88 89  Resp: 18 18 16 16   Temp: 98.5 F (36.9 C) 97.7 F (36.5 C) 98 F (36.7 C) 97.9 F (36.6 C)  TempSrc:      SpO2: 98% 96% 100% 98%  Weight:  116.9 kg      Intake/Output Summary (Last 24 hours) at 05/13/2022 1539 Last data filed at 05/13/2022 1421 Gross per 24 hour  Intake 220 ml  Output --  Net 220 ml    Filed Weights   05/11/22 0535 05/12/22 0337 05/13/22 0326  Weight: 116.3 kg 119.6 kg 116.9 kg    Data Reviewed: I have personally reviewed and interpreted daily labs, tele strips, imagings as discussed above. I reviewed all nursing notes, pharmacy notes, vitals, pertinent old records I have discussed plan of  care as described above with RN and patient/family.  CBC: Recent Labs  Lab 05/08/22 0526 05/09/22 0539 05/10/22 0334 05/11/22 0711 05/12/22 0524  WBC 11.2* 9.7 12.2* 11.9* 11.8*  HGB 11.5* 11.4* 11.8* 11.2* 12.0  HCT 37.8 37.6 38.7 35.7* 38.9  MCV 80.9 81.6 81.6 79.5* 81.0  PLT 227 240 260 256 281    Basic Metabolic Panel: Recent Labs  Lab 05/07/22 0442 05/08/22 0526 05/09/22 0539 05/10/22 0334 05/11/22 0711 05/12/22 0524 05/13/22 0422  NA 129*   < > 134* 134* 131* 135 133*  K 4.6   < > 3.6 4.0 3.0* 4.2 4.2  CL 93*   < > 93* 92* 85* 88* 90*  CO2 28   < > 33* 35* 35* 37* 35*  GLUCOSE 352*   < > 159* 236* 206* 243* 271*  BUN 50*   < > 44* 42* 52* 64* 63*  CREATININE 0.98   < > 0.93 0.87 1.02* 1.04* 1.19*  CALCIUM 8.3*   < > 8.2* 8.2* 8.4* 9.3 9.1  MG 2.0  --   --   --   --   --   --   PHOS 5.2*  --   --   --   --   --   --    < > = values in this interval not displayed.     Studies: No results found.  Scheduled Meds:  acetaminophen  650 mg Oral Q8H   aspirin EC  81 mg Oral Daily   atorvastatin  10 mg Oral Daily   buprenorphine-naloxone  1 tablet Sublingual TID   cyanocobalamin  1,000 mcg Intramuscular Daily   Followed by   Melene Muller ON 05/14/2022] vitamin B-12  1,000 mcg Oral Daily   enoxaparin (LOVENOX) injection  0.5 mg/kg Subcutaneous Q24H   furosemide  40 mg Intravenous Once   insulin aspart  0-20 Units Subcutaneous TID AC & HS   insulin aspart  25 Units Subcutaneous TID WC   insulin glargine-yfgn  80 Units Subcutaneous BID   irbesartan  75 mg Oral Daily   metolazone  5 mg Oral Once   sodium chloride flush  3 mL Intravenous Q12H   spironolactone  25 mg Oral Daily   [START ON 05/14/2022] torsemide  40 mg Oral Daily   Vitamin D (Ergocalciferol)  50,000 Units Oral Q7 days   Continuous Infusions:  sodium chloride     PRN Meds: sodium chloride, acetaminophen, diclofenac Sodium, fluticasone, [DISCONTINUED] hydrALAZINE **OR** hydrALAZINE, hydrOXYzine, ondansetron  (ZOFRAN) IV, oxyCODONE, sodium chloride flush  Time spent: 50 minutes  Author: Arnetha Courser. MD [facility] 05/13/2022 3:39 PM  To reach On-call, see care teams to locate the attending and reach out to them via [email or web address]. If 7PM-7AM, please contact night-coverage If you still have difficulty reaching the attending provider, please page the [facility] (Director on Call) for [facility]  on amion for assistance.

## 2022-05-13 NOTE — Consult Note (Addendum)
Cardiology Consultation   Patient ID: Caitlyn Fuller MRN: [id number]; DOB: 1975-06-14  Admit date: 05/01/2022 Date of Consult: 05/13/2022  PCP:  [facility]   [facility] Providers Cardiologist:  None        Patient Profile:   Caitlyn Fuller is a 47 y.o. female with a hx of HFpEF, morbid obesity who is being seen 05/13/2022 for the evaluation of leg edema at the request of Dr. Nelson Chimes.  History of Present Illness:   Caitlyn Fuller is a 47 year old female with history of hypertension, HFpEF, morbid obesity, presenting with worsening shortness of breath and leg edema.  Patient admitted over a week ago due to volume overload.  Has been manage with Lasix drip so far.  Known to our service after being admitted about 6 months ago for similar reasons.  She was managed with IV Lasix and metolazone.  Did not follow-up with cardiology as outpatient.  She states her shortness of breath is improved compared to admission, although she still complains of leg tightness and swelling.  EKG on admission showed sinus rhythm.  Net 583 cc over the past 24 hours.  Lasix drip held due to hyponatremia and elevated BUN.   Past Medical History:  Diagnosis Date   Asthma    CHF (congestive heart failure) (HCC)    Chronic heart failure with preserved ejection fraction (HFpEF) (HCC)    a. 04/2021 Echo: EF 65-70%, no rwma, GrI DD, mildly dil LA; b. 11/2021 Echo: EF 60-65%, no rwma, mod LVH, GrII DD, nl RV fxn, mildly dil LA, mild MR.   Diabetes mellitus without complication (HCC)    Hernia    Hypertension    Kidney infection    Morbid obesity (HCC)    Shingles     Past Surgical History:  Procedure Laterality Date   ABDOMINAL SURGERY     CESAREAN SECTION     CHOLECYSTECTOMY     colonscopy     TUBAL LIGATION       Home Medications:  Prior to Admission medications   Medication Sig Start Date End Date Taking? Authorizing Provider  blood glucose meter kit and  supplies KIT Dispense based on patient and insurance preference. Check sugar 4 times a day before meals and bedtime 11/22/21  Yes Wieting, Richard, MD  buprenorphine-naloxone (SUBOXONE) 8-2 mg SUBL SL tablet Place 1 tablet under the tongue 3 (three) times daily.   Yes [provider]  furosemide (LASIX) 40 MG tablet Take 1 tablet (40 mg total) by mouth daily. 11/22/21  Yes Wieting, Richard, MD  insulin aspart (NOVOLOG) 100 UNIT/ML injection 15 units subcutaneous injection prior to meals Patient taking differently: Inject 15 Units into the skin 3 (three) times daily with meals. 11/22/21  Yes Wieting, Richard, MD  insulin detemir (LEVEMIR) 100 UNIT/ML FlexPen Inject 50 Units into the skin at bedtime. 11/22/21  Yes Wieting, Richard, MD  Insulin Pen Needle 33G X 5 MM MISC 1 Dose by Does not apply route 3 (three) times daily before meals. 11/22/21  Yes Wieting, Richard, MD  irbesartan (AVAPRO) 75 MG tablet Take 1 tablet (75 mg total) by mouth daily. 02/12/22  Yes Kirby Crigler, Mir M, MD  aspirin EC 81 MG tablet Take 1 tablet (81 mg total) by mouth daily. Swallow whole. Patient not taking: Reported on 02/09/2022 11/22/21   Alford Highland, MD  atorvastatin (LIPITOR) 10 MG tablet Take 1 tablet (10 mg total) by mouth daily. Patient not taking: Reported on 02/09/2022 11/22/21  Wieting, Richard, MD  potassium chloride SA (KLOR-CON M) 20 MEQ tablet Take 1 tablet (20 mEq total) by mouth daily. 02/11/22 03/13/22  Maryln Gottron, MD    Inpatient Medications: Scheduled Meds:  acetaminophen  650 mg Oral Q8H   aspirin EC  81 mg Oral Daily   atorvastatin  10 mg Oral Daily   buprenorphine-naloxone  1 tablet Sublingual TID   cyanocobalamin  1,000 mcg Intramuscular Daily   Followed by   Melene Muller ON 05/14/2022] vitamin B-12  1,000 mcg Oral Daily   enoxaparin (LOVENOX) injection  0.5 mg/kg Subcutaneous Q24H   furosemide  40 mg Intravenous Once   insulin aspart  0-20 Units Subcutaneous TID AC & HS   insulin aspart   25 Units Subcutaneous TID WC   insulin glargine-yfgn  80 Units Subcutaneous BID   irbesartan  75 mg Oral Daily   metolazone  5 mg Oral Once   sodium chloride flush  3 mL Intravenous Q12H   spironolactone  25 mg Oral Daily   Vitamin D (Ergocalciferol)  50,000 Units Oral Q7 days   Continuous Infusions:  sodium chloride     PRN Meds: sodium chloride, acetaminophen, diclofenac Sodium, fluticasone, [DISCONTINUED] hydrALAZINE **OR** hydrALAZINE, hydrOXYzine, ondansetron (ZOFRAN) IV, oxyCODONE, sodium chloride flush  Allergies:    Allergies  Allergen Reactions   Amlodipine Swelling   Gabapentin Anaphylaxis   Hydralazine Shortness Of Breath    Patient felt she had panic attack after IV hydralazine administered   Morphine And Related Anaphylaxis   Darvocet [Propoxyphene N-Acetaminophen] Hives   Toradol [Ketorolac Tromethamine] Other (See Comments)    migraines   Ultram [Tramadol] Hives   Vancomycin Itching    Social History:   Social History   Socioeconomic History   Marital status: Single    Spouse name: Not on file   Number of children: Not on file   Years of education: Not on file   Highest education level: Not on file  Occupational History   Not on file  Tobacco Use   Smoking status: Former    Packs/day: .5    Types: Cigarettes   Smokeless tobacco: Never  Vaping Use   Vaping Use: Never used  Substance and Sexual Activity   Alcohol use: No   Drug use: Yes    Types: "Crack" cocaine   Sexual activity: Not on file  Other Topics Concern   Not on file  Social History Narrative   Not on file   Social Determinants of Health   Financial Resource Strain: Not on file  Food Insecurity: No Food Insecurity (05/02/2022)   Hunger Vital Sign    Worried About Running Out of Food in the Last Year: Never true    Ran Out of Food in the Last Year: Never true  Transportation Needs: No Transportation Needs (05/02/2022)   PRAPARE - Administrator, Civil Service (Medical):  No    Lack of Transportation (Non-Medical): No  Physical Activity: Not on file  Stress: Not on file  Social Connections: Not on file  Intimate Partner Violence: Not At Risk (05/02/2022)   Humiliation, Afraid, Rape, and Kick questionnaire    Fear of Current or Ex-Partner: No    Emotionally Abused: No    Physically Abused: No    Sexually Abused: No    Family History:    Family History  Problem Relation Age of Onset   Cancer Mother    Heart failure Father      ROS:  Please see  the history of present illness.   All other ROS reviewed and negative.     Physical Exam/Data:   Vitals:   05/12/22 2323 05/13/22 0326 05/13/22 0744 05/13/22 1136  BP: 133/73 (!) 143/87 134/85 130/66  Pulse: 94 88 88 89  Resp: 18 18 16 16   Temp: 98.5 F (36.9 C) 97.7 F (36.5 C) 98 F (36.7 C) 97.9 F (36.6 C)  TempSrc:      SpO2: 98% 96% 100% 98%  Weight:  116.9 kg      Intake/Output Summary (Last 24 hours) at 05/13/2022 1439 Last data filed at 05/13/2022 1421 Gross per 24 hour  Intake 508.09 ml  Output --  Net 508.09 ml      05/13/2022    3:26 AM 05/12/2022    3:37 AM 05/11/2022    5:35 AM  Last 3 Weights  Weight (lbs) 257 lb 12.8 oz 263 lb 10.7 oz 256 lb 6.3 oz  Weight (kg) 116.937 kg 119.6 kg 116.3 kg     Body mass index is 50.35 kg/m.  General:  Well nourished, well developed, in no acute distress HEENT: normal Neck: no JVD Vascular: No carotid bruits; Distal pulses 2+ bilaterally Cardiac:  normal S1, S2; RRR; no murmur  Lungs: Diminished breath sounds at bases Abd: soft, nontender, no hepatomegaly  Ext: 2-3+ pitting edema Musculoskeletal:  No deformities, BUE and BLE strength normal and equal Skin: warm and dry  Neuro:  CNs 2-12 intact, no focal abnormalities noted Psych:  Normal affect   EKG:  The EKG was personally reviewed and demonstrates: Sinus rhythm Telemetry:  Telemetry was personally reviewed and demonstrates: Not currently on telemetry  Relevant CV Studies: Echo  04/2022 EF 60 to 65%, grade 2 diastolic dysfunction  Laboratory Data:  High Sensitivity Troponin:  No results for input(s): "TROPONINIHS" in the last 720 hours.   Chemistry Recent Labs  Lab 05/07/22 0442 05/08/22 0526 05/11/22 0711 05/12/22 0524 05/13/22 0422  NA 129*   < > 131* 135 133*  K 4.6   < > 3.0* 4.2 4.2  CL 93*   < > 85* 88* 90*  CO2 28   < > 35* 37* 35*  GLUCOSE 352*   < > 206* 243* 271*  BUN 50*   < > 52* 64* 63*  CREATININE 0.98   < > 1.02* 1.04* 1.19*  CALCIUM 8.3*   < > 8.4* 9.3 9.1  MG 2.0  --   --   --   --   GFRNONAA >60   < > >60 >60 57*  ANIONGAP 8   < > 11 10 8    < > = values in this interval not displayed.    No results for input(s): "PROT", "ALBUMIN", "AST", "ALT", "ALKPHOS", "BILITOT" in the last 168 hours. Lipids No results for input(s): "CHOL", "TRIG", "HDL", "LABVLDL", "LDLCALC", "CHOLHDL" in the last 168 hours.  Hematology Recent Labs  Lab 05/10/22 0334 05/11/22 0711 05/12/22 0524  WBC 12.2* 11.9* 11.8*  RBC 4.74 4.49 4.80  HGB 11.8* 11.2* 12.0  HCT 38.7 35.7* 38.9  MCV 81.6 79.5* 81.0  MCH 24.9* 24.9* 25.0*  MCHC 30.5 31.4 30.8  RDW 14.7 14.6 14.8  PLT 260 256 281   Thyroid No results for input(s): "TSH", "FREET4" in the last 168 hours.  BNPNo results for input(s): "BNP", "PROBNP" in the last 168 hours.  DDimer No results for input(s): "DDIMER" in the last 168 hours.   Radiology/Studies:  No results found.   Assessment and  Plan:   HFpEF -Still volume overloaded with 2-3+ pitting leg edema -Net -500 cc over the past 24 hours -Previously adequately diuresed on IV Lasix and metolazone -Will dose IV Lasix 40 mg x 1 with metolazone 5 mg. -Monitor ins and out, creatinine, potassium, BUN  2.  Hypertension -BP controlled -Aldactone, avapro  3.  Morbid obesity, possible sleep apnea -Will need outpatient evaluation   Total encounter time more than 85 minutes  Greater than 50% was spent in counseling and coordination of care with  the patient   Signed, Debbe Odea, MD  05/13/2022 2:39 PM

## 2022-05-14 DIAGNOSIS — I10 Essential (primary) hypertension: Secondary | ICD-10-CM | POA: Diagnosis not present

## 2022-05-14 DIAGNOSIS — I5033 Acute on chronic diastolic (congestive) heart failure: Secondary | ICD-10-CM | POA: Diagnosis not present

## 2022-05-14 DIAGNOSIS — E1165 Type 2 diabetes mellitus with hyperglycemia: Secondary | ICD-10-CM | POA: Diagnosis not present

## 2022-05-14 DIAGNOSIS — I5023 Acute on chronic systolic (congestive) heart failure: Secondary | ICD-10-CM | POA: Diagnosis not present

## 2022-05-14 LAB — BASIC METABOLIC PANEL
Anion gap: 12 (ref 5–15)
BUN: 61 mg/dL — ABNORMAL HIGH (ref 6–20)
CO2: 30 mmol/L (ref 22–32)
Calcium: 8.9 mg/dL (ref 8.9–10.3)
Chloride: 94 mmol/L — ABNORMAL LOW (ref 98–111)
Creatinine, Ser: 0.96 mg/dL (ref 0.44–1.00)
GFR, Estimated: >60 mL/min (ref >60)
Glucose, Bld: 223 mg/dL — ABNORMAL HIGH (ref 70–99)
Potassium: 4.1 mmol/L (ref 3.5–5.1)
Sodium: 136 mmol/L (ref 135–145)

## 2022-05-14 LAB — GLUCOSE, CAPILLARY
Glucose-Capillary: 199 mg/dL — ABNORMAL HIGH (ref 70–99)
Glucose-Capillary: 57 mg/dL — ABNORMAL LOW (ref 70–99)
Glucose-Capillary: 110 mg/dL — ABNORMAL HIGH (ref 70–99)
Glucose-Capillary: 244 mg/dL — ABNORMAL HIGH (ref 70–99)
Glucose-Capillary: 213 mg/dL — ABNORMAL HIGH (ref 70–99)
Glucose-Capillary: 256 mg/dL — ABNORMAL HIGH (ref 70–99)

## 2022-05-14 MED ORDER — METOLAZONE 5 MG PO TABS
5.0000 mg | ORAL_TABLET | Freq: Every day | ORAL | Status: DC
  Administered 2022-05-14: 5 mg via ORAL
  Filled 2022-05-14 (×2): qty 1

## 2022-05-14 NOTE — Progress Notes (Addendum)
[facility]  ROUNDING NOTE   Subjective:   Patient seen sitting up in bed, completed breakfast tray at bedside Patient states she feels that edema has not improved Continues to complain of abdominal distention and chest discomfort.  She feels this is from fluid Remains on room air  Objective:  Vital signs in last 24 hours:  Temp:  [97.6 F (36.4 C)-98.3 F (36.8 C)] 97.6 F (36.4 C) (05/05 1129) Pulse Rate:  [86-97] 94 (05/05 1129) Resp:  [15-18] 16 (05/05 1129) BP: (115-186)/(65-98) 162/65 (05/05 1129) SpO2:  [94 %-100 %] 95 % (05/05 1129) Weight:  [116.8 kg-117.5 kg] 116.8 kg (05/05 0945)  Weight change: 0.59 kg Filed Weights   05/13/22 0326 05/14/22 0433 05/14/22 0945  Weight: 116.9 kg 117.5 kg 116.8 kg    Intake/Output: I/O last 3 completed shifts: In: 856 [P.O.:820; I.V.:36] Out: 800 [Urine:800]   Intake/Output this shift:  No intake/output data recorded.  Physical Exam: General: NAD  Head: Normocephalic, atraumatic. Moist oral mucosal membranes  Eyes: Anicteric  Lungs:  Clear to auscultation, normal effort  Heart: Regular rate and rhythm  Abdomen:  Soft, nontender  Extremities:  1+ peripheral edema  Neurologic: Alert and oriented, moving all four extremities  Skin: No lesions  Access: None    Basic Metabolic Panel: Recent Labs  Lab 05/10/22 0334 05/11/22 0711 05/12/22 0524 05/13/22 0422 05/14/22 0516  NA 134* 131* 135 133* 136  K 4.0 3.0* 4.2 4.2 4.1  CL 92* 85* 88* 90* 94*  CO2 35* 35* 37* 35* 30  GLUCOSE 236* 206* 243* 271* 223*  BUN 42* 52* 64* 63* 61*  CREATININE 0.87 1.02* 1.04* 1.19* 0.96  CALCIUM 8.2* 8.4* 9.3 9.1 8.9     Liver Function Tests: No results for input(s): "AST", "ALT", "ALKPHOS", "BILITOT", "PROT", "ALBUMIN" in the last 168 hours. No results for input(s): "LIPASE", "AMYLASE" in the last 168 hours. No results for input(s): "AMMONIA" in the last 168 hours.  CBC: Recent Labs  Lab 05/08/22 0526  05/09/22 0539 05/10/22 0334 05/11/22 0711 05/12/22 0524  WBC 11.2* 9.7 12.2* 11.9* 11.8*  HGB 11.5* 11.4* 11.8* 11.2* 12.0  HCT 37.8 37.6 38.7 35.7* 38.9  MCV 80.9 81.6 81.6 79.5* 81.0  PLT 227 240 260 256 281     Cardiac Enzymes: No results for input(s): "CKTOTAL", "CKMB", "CKMBINDEX", "TROPONINI" in the last 168 hours.  BNP: Invalid input(s): "POCBNP"  CBG: Recent Labs  Lab 05/13/22 1638 05/13/22 2128 05/14/22 0737 05/14/22 1130 05/14/22 1234  GLUCAP 134* 181* 199* 57* 110*     Microbiology: Results for orders placed or performed during the hospital encounter of 11/16/21  SARS Coronavirus 2 by RT PCR (hospital order, performed in [facility] hospital lab) *cepheid single result test* Anterior Nasal Swab     Status: None   Collection Time: 11/16/21  5:13 PM   Specimen: Anterior Nasal Swab  Result Value Ref Range Status   SARS Coronavirus 2 by RT PCR NEGATIVE NEGATIVE Final    Comment: (NOTE) SARS-CoV-2 target nucleic acids are NOT DETECTED.  The SARS-CoV-2 RNA is generally detectable in upper and lower respiratory specimens during the acute phase of infection. The lowest concentration of SARS-CoV-2 viral copies this assay can detect is 250 copies / mL. A negative result does not preclude SARS-CoV-2 infection and should not be used as the sole basis for treatment or other patient management decisions.  A negative result may occur with improper specimen collection / handling, submission of specimen other than  nasopharyngeal swab, presence of viral mutation(s) within the areas targeted by this assay, and inadequate number of viral copies (<250 copies / mL). A negative result must be combined with clinical observations, patient history, and epidemiological information.  Fact Sheet for Patients:   RoadLapTop.co.za  Fact Sheet for Healthcare Providers: http://kim-miller.com/  This test is not yet approved or  cleared by  the [place] FDA and has been authorized for detection and/or diagnosis of SARS-CoV-2 by FDA under an Emergency Use Authorization (EUA).  This EUA will remain in effect (meaning this test can be used) for the duration of the COVID-19 declaration under Section 564(b)(1) of the Act, 21 U.S.C. section 360bbb-3(b)(1), unless the authorization is terminated or revoked sooner.  Performed at [facility], [address] [place], [place] [address]     Coagulation Studies: No results for input(s): "LABPROT", "INR" in the last 72 hours.  Urinalysis: No results for input(s): "COLORURINE", "LABSPEC", "PHURINE", "GLUCOSEU", "HGBUR", "BILIRUBINUR", "KETONESUR", "PROTEINUR", "UROBILINOGEN", "NITRITE", "LEUKOCYTESUR" in the last 72 hours.  Invalid input(s): "APPERANCEUR"    Imaging: No results found.   Medications:    sodium chloride      acetaminophen  650 mg Oral Q8H   aspirin EC  81 mg Oral Daily   atorvastatin  10 mg Oral Daily   buprenorphine-naloxone  1 tablet Sublingual TID   vitamin B-12  1,000 mcg Oral Daily   enoxaparin (LOVENOX) injection  0.5 mg/kg Subcutaneous Q24H   insulin aspart  0-20 Units Subcutaneous TID AC & HS   insulin aspart  25 Units Subcutaneous TID WC   insulin glargine-yfgn  80 Units Subcutaneous BID   irbesartan  75 mg Oral Daily   metolazone  5 mg Oral Daily   sodium chloride flush  3 mL Intravenous Q12H   spironolactone  25 mg Oral Daily   torsemide  40 mg Oral Daily   Vitamin D (Ergocalciferol)  50,000 Units Oral Q7 days   sodium chloride, acetaminophen, diclofenac Sodium, fluticasone, [DISCONTINUED] hydrALAZINE **OR** hydrALAZINE, hydrOXYzine, ondansetron (ZOFRAN) IV, oxyCODONE, sodium chloride flush  Assessment/ Plan:  Ms. GURNAAZ SCHWAKE is a 47 y.o.  female  with past medical conditions including uncontrolled type 2 diabetes, hypertension, obesity, heart failure, polysubstance abuse, who was admitted to [facility] on 05/01/2022 for  Anasarca [R60.1] Hypertensive urgency [I16.0] Acute on chronic systolic CHF (congestive heart failure) (HCC) [I50.23] Hypervolemia, unspecified hypervolemia type [E87.70] Uncontrolled type 2 diabetes mellitus with hyperglycemia (HCC) [E11.65] Acute on chronic heart failure with preserved ejection fraction (HFpEF) (HCC) [I50.33]   Chronic kidney disease stage II likely secondary to uncontrolled hypertension and type 2 diabetes.  Baseline creatinine appears to be 0.88 with GFR 82 on 12/12/2021.  Renal ultrasound negative for obstruction or echogenicity.    Creatinine has returned to baseline.  Patient transition to oral diuresis, torsemide 40 mg daily with spironolactone 25 mg daily.  Overall edema has improved since admission.  Inadequate recording of urine output and questionable weights contradict. Reported that patient not adhering to fluid restrictions with outside drinks.  Recommend standing weights for this patient.  Recommend rechecking BMP in a.m.  Will schedule follow-up for this patient at discharge.  2. Acute on chronic diastolic heart failure with volume overload.  Echo from 05/08/2022 shows EF 60 to 65% with grade 2 diastolic dysfunction.  Cardiology following.   Transitioned to oral diuretics.    3.  Acute metabolic alkalosis, serum bicarb increased to 35.  Likely due to aggressive diuresis. Corrected  4.  Hyponatremia/hypokalemia.  Oral supplementation by primary team yesterday.  Decreased sodium likely due to diuresis.  Stable  5. Anemia of chronic kidney disease Lab Results  Component Value Date   HGB 12.0 05/12/2022    Hgb at goal  6. 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 Home regimen includes NovoLog and Levemir. Most recent hemoglobin A1c is 14.3 on 05/02/22.   Glucose elevated at times. Primary team to manage SSI   7.  Hypertension with chronic kidney disease.  Home regimen includes irbesartan and furosemide.   Currently receiving these medications.    LOS: 12 Domanick Cuccia 5/5/202412:53 PM

## 2022-05-14 NOTE — Progress Notes (Signed)
Rounding Note    Patient Name: Caitlyn Fuller Date of Encounter: 05/14/2022  [facility] Cardiologist: None   Subjective   No acute events overnight, ins and outs not accurately measured.  Was given metolazone 5 mg x 1 yesterday, IV Lasix 40 mg x 1.  Apparently patient not adherent to fluid restriction.  Drinks what ever is brought in per family.  Creatinine better this morning.  Started on torsemide per nephrology.  Inpatient Medications    Scheduled Meds:  acetaminophen  650 mg Oral Q8H   aspirin EC  81 mg Oral Daily   atorvastatin  10 mg Oral Daily   buprenorphine-naloxone  1 tablet Sublingual TID   vitamin B-12  1,000 mcg Oral Daily   enoxaparin (LOVENOX) injection  0.5 mg/kg Subcutaneous Q24H   insulin aspart  0-20 Units Subcutaneous TID AC & HS   insulin aspart  25 Units Subcutaneous TID WC   insulin glargine-yfgn  80 Units Subcutaneous BID   irbesartan  75 mg Oral Daily   metolazone  5 mg Oral Daily   sodium chloride flush  3 mL Intravenous Q12H   spironolactone  25 mg Oral Daily   torsemide  40 mg Oral Daily   Vitamin D (Ergocalciferol)  50,000 Units Oral Q7 days   Continuous Infusions:  sodium chloride     PRN Meds: sodium chloride, acetaminophen, diclofenac Sodium, fluticasone, [DISCONTINUED] hydrALAZINE **OR** hydrALAZINE, hydrOXYzine, ondansetron (ZOFRAN) IV, oxyCODONE, sodium chloride flush   Vital Signs    Vitals:   05/14/22 0737 05/14/22 0737 05/14/22 0945 05/14/22 1129  BP: 115/68 115/68  (!) 162/65  Pulse: 88 88  94  Resp: 18 18  16   Temp: 98.2 F (36.8 C) 98.2 F (36.8 C)  97.6 F (36.4 C)  TempSrc: Oral   Oral  SpO2: 94% 94%  95%  Weight:   116.8 kg     Intake/Output Summary (Last 24 hours) at 05/14/2022 1302 Last data filed at 05/13/2022 2204 Gross per 24 hour  Intake 583 ml  Output 800 ml  Net -217 ml      05/14/2022    9:45 AM 05/14/2022    4:33 AM 05/13/2022    3:26 AM  Last 3 Weights  Weight (lbs) 257 lb 9.6 oz 259 lb  1.6 oz 257 lb 12.8 oz  Weight (kg) 116.847 kg 117.527 kg 116.937 kg      Telemetry    Currently off telemetry- Personally Reviewed  ECG     - Personally Reviewed  Physical Exam   GEN: No acute distress.  Obese. Neck: No JVD Cardiac: RRR, no murmurs, rubs, or gallops.  Respiratory: Diminished breath sounds at bases, GI: Soft, nontender, distended  MS: 2+ pitting edema; No deformity. Neuro:  Nonfocal  Psych: Normal affect   Labs    High Sensitivity Troponin:  No results for input(s): "TROPONINIHS" in the last 720 hours.   Chemistry Recent Labs  Lab 05/12/22 0524 05/13/22 0422 05/14/22 0516  NA 135 133* 136  K 4.2 4.2 4.1  CL 88* 90* 94*  CO2 37* 35* 30  GLUCOSE 243* 271* 223*  BUN 64* 63* 61*  CREATININE 1.04* 1.19* 0.96  CALCIUM 9.3 9.1 8.9  GFRNONAA >60 57* >60  ANIONGAP 10 8 12     Lipids No results for input(s): "CHOL", "TRIG", "HDL", "LABVLDL", "LDLCALC", "CHOLHDL" in the last 168 hours.  Hematology Recent Labs  Lab 05/10/22 0334 05/11/22 0711 05/12/22 0524  WBC 12.2* 11.9* 11.8*  RBC 4.74  4.49 4.80  HGB 11.8* 11.2* 12.0  HCT 38.7 35.7* 38.9  MCV 81.6 79.5* 81.0  MCH 24.9* 24.9* 25.0*  MCHC 30.5 31.4 30.8  RDW 14.7 14.6 14.8  PLT 260 256 281   Thyroid No results for input(s): "TSH", "FREET4" in the last 168 hours.  BNPNo results for input(s): "BNP", "PROBNP" in the last 168 hours.  DDimer No results for input(s): "DDIMER" in the last 168 hours.   Radiology    No results found.  Cardiac Studies   TTE 05/08/2022  1. Left ventricular ejection fraction, by estimation, is 60 to 65%. The  left ventricle has normal function. The left ventricle has no regional  wall motion abnormalities. There is mild left ventricular hypertrophy.  Left ventricular diastolic parameters  are consistent with Grade II diastolic dysfunction (pseudonormalization).   2. Right ventricular systolic function is normal. The right ventricular  size is normal. Tricuspid  regurgitation signal is inadequate for assessing  PA pressure.   3. Left atrial size was mildly dilated.   4. The mitral valve is normal in structure. No evidence of mitral valve  regurgitation. No evidence of mitral stenosis.   5. The aortic valve is normal in structure. Aortic valve regurgitation is  not visualized. No aortic stenosis is present.   6. The inferior vena cava is normal in size with greater than 50%  respiratory variability, suggesting right atrial pressure of 3 mmHg.   Patient Profile     47 y.o. female with history of HFpEF, morbid obesity presenting with leg edema being seen for volume overload.  Assessment & Plan    HFpEF -Still volume overloaded with 2+ pitting leg edema -Started on oral torsemide per nephrology. -Start metolazone 5 mg daily -Monitor ins and out, creatinine   2.  Hypertension -BP controlled -Aldactone, avapro   3.  Morbid obesity, possible sleep apnea -Will need outpatient evaluation     Total encounter time more than 50 minutes  Greater than 50% was spent in counseling and coordination of care with the patient     Signed, Debbe Odea, MD  05/14/2022, 1:02 PM

## 2022-05-14 NOTE — Progress Notes (Signed)
[facility] Progress Note  Patient: Caitlyn Fuller    ZOX:[id number]  DOA: 05/01/2022     Date of Service: the patient was seen and examined on 05/14/2022  Chief Complaint  Patient presents with   Hyperglycemia   Brief hospital course: Caitlyn Fuller is a 47 y.o. female with medical history significant of HFpEF, insulin-dependent type 2 diabetes, hypertension, polysubstance abuse, chronic pain syndrome, morbid obesity, who presents to the ED with complaints of elevated blood sugar.  Patient is noncompliant with her medications  5/3: Vitals with elevated blood pressure at 157/83.  Renal function and alkalosis seems stable.  Most likely had pseudohyponatremia as CBG remained elevated.  No urinary output recorded but according to patient she is having good urinary output.  Nephrology is on board.  Volume status improving but still appears volume up.  Remained on Lasix infusion. Patient was on oxycodone and Suboxone both.  Became little irritable when asked why she is taking both, stating that she was taking Suboxone for other reasons.  She was requesting to increase the dose of oxycodone due to uncontrolled pain, stating that she was taking 15 mg at home and here she was getting only 5 mg.  Dose increased to 10 mg.  Need to discontinue Suboxone if she remained on oxycodone.  5/4: Vital stable.  Slight worsening of creatinine so Lasix infusion was discontinued.  Patient became very upset and rude when told that she might be going home soon.  Continue to have significant lower extremity edema.  Patient is being noncompliant with her fluid restrictions while in the hospital, apparently family members are bringing different drinks.  She was not saving most of her urine so no strict intake and output recorded.  She was very threatening with the potential discharge and rudely saying that she wanted a different physician and second opinion and asking me to get out of the room.  Charge nurse  notified.   Cardiology was also consulted-she is a known to the practice and never showed up for her follow-up appointments. Cardiology is recommending trying 1 dose of IV Lasix with metolazone with close monitoring of renal function for 1 day.  5/5: Hemodynamically stable.  No strict in and out recorded. Nephrology started her on torsemide and metolazone was also added.  Improving lower extremity edema but continued to have 2+.  She was having excessive daytime sleepiness -likely some element of sleep apnea due to her weight, she will need outpatient sleep study.  Assessment and Plan:  # Hyperglycemia, uncontrolled diabetes mellitus due to noncompliance IDDM T2 5/5.  1 episode of hypoglycemia requiring p.o. juice, overall CBGs stable and improving.  Continuing current regimen with close monitoring. 5/3: Increase Semglee to 80 units twice daily and continue rest 4/28 increased  Semglee 75 units twice daily 4/27 Increased NovoLog 25 units 3 times daily and sliding scale 4/26 NovoLog 10 units IV given due to high blood sugar Monitor CBG, continue diabetic diet Diabetic coordinator consulted for further management and counseling   # Anasarca, diastolic CHF patient has significant bilateral lower extremity edema D-dimer negative, venous duplex negative for DVT US renal: The kidneys are unremarkable. A large postvoid residual of 108 cc is identified. The bladder is otherwise unremarkable. Monitor intake output and renal functions, continue fluid restriction 1.5 L/day-patient is noncompliant She was initially started on IV Lasix followed by Lasix infusion.  Did develop contraction alkalosis due to high rates and nephrology was consulted.  Rate was decreased.  Per patient  she is peeing good but not providing all the urine for measurement.  Not following the directions for fluid restriction. Patient remained on Lasix infusion for many days and discontinued today due to mild hyponatremia and some  worsening of creatinine. Cardiology was also consulted due to her threatening behavior. Apparently she was noncompliant and not following as outpatient. -She was started on torsemide and metolazone -Continue with strict intake and output if she cooperates. -Daily weight and BMP -Will decide about more IV Lasix tomorrow, -Spironolactone was also added  # Accelerated hypertension  Uncontrolled blood pressure, BP 197/66 on admission Continue irbesartan 75 mg p.o. daily home dose Use hydralazine p.o. or IV as needed as per parameters S/p Lasix 40 mg IV daily Monitor BP and titrate medications accordingly 4/24 --4/28 s/p Lopressor 50 mg p.o. BID, discontinued, patient was feeling dizziness after taking medication.   4/28 started Coreg 6.25 mg p.o. twice daily 4/29 blood pressures soft now, patient refused coreg last night, so discontinued 5/3: Blood pressure remained mildly elevated.  We will continue irbesartan and monitor  # Chronic pain Patient endorses chronic pain for which she states she was previously on Percocet.  PDMP reviewed and last prescription for oxycodone was in November 2023. - Conservative management and recommend outpatient follow-up with PCP 4/28 patient stated no allergy to Tylenol, so started Tylenol 650 mg p.o. every 6 hourly as needed 4/29 started oxycodone 5 mg p.o. every 6 hours as needed 5/2: Oxycodone dose increased to 10 mg p.o. every 6 hourly as needed.  Suboxone needs to be discontinued, patient became very irritable stating that she is using Suboxone for other reasons.  # Polysubstance abuse - Continue home Suboxone  # Sore throat Patient states that she developed perioral erythema and lesions on her tongue after taking a new blood pressure medicine last night.  At this time, she continues to have a sore throat.  On exam, she has some mild erythema, but no edema. - Supportive management  # Obesity Body mass index is 46.89 kg/m.  Interventions: Calorie  restricted diet and daily exercise advised to lose body weight.  Lifestyle modification discussed.   # Vitamin D deficiency: started vitamin D 50,000 units p.o. weekly, follow with PCP to repeat vitamin D level after 3 to 6 months. #Vitamin B12 level 205 at lower end, goal >400, started vitamin B12 1000 mcg IM injection daily for 7 days during hospital stay followed by oral supplement at discharge.  Follow with PCP to repeat B12 level after 3 to 6 months.     Diet: Diabetic diet, fluid restriction 1.5 L/day DVT Prophylaxis: Subcutaneous Lovenox   Advance goals of care discussion: Full code  Family Communication: Discussed with patient  Disposition:  Pt is from Home, admitted with diabetes,  accelerated blood pressure, and anasarca, still has lower extremity edema, on IV Lasix infusion, which precludes a safe discharge. Discharge to home, when clinically stable.  Subjective: Patient was complaining of excessive daytime sleepiness when seen today.  No chest pain or shortness of breath.  Likely some element of sleep apnea.  Physical Exam: General.  Morbidly obese lady, in no acute distress. Pulmonary.  Lungs clear bilaterally, normal respiratory effort. CV.  Regular rate and rhythm, no JVD, rub or murmur. Abdomen.  Soft, nontender, nondistended, BS positive. CNS.  Alert and oriented .  No focal neurologic deficit. Extremities.  2+ LE edema, no cyanosis, pulses intact and symmetrical. Psychiatry.  Judgment and insight appears normal.   Vitals:  05/14/22 0737 05/14/22 0737 05/14/22 0945 05/14/22 1129  BP: 115/68 115/68  (!) 162/65  Pulse: 88 88  94  Resp: 18 18  16   Temp: 98.2 F (36.8 C) 98.2 F (36.8 C)  97.6 F (36.4 C)  TempSrc: Oral   Oral  SpO2: 94% 94%  95%  Weight:   116.8 kg     Intake/Output Summary (Last 24 hours) at 05/14/2022 1507 Last data filed at 05/13/2022 2204 Gross per 24 hour  Intake 363 ml  Output 800 ml  Net -437 ml    Filed Weights   05/13/22 0326  05/14/22 0433 05/14/22 0945  Weight: 116.9 kg 117.5 kg 116.8 kg    Data Reviewed: I have personally reviewed and interpreted daily labs, tele strips, imagings as discussed above. I reviewed all nursing notes, pharmacy notes, vitals, pertinent old records I have discussed plan of care as described above with RN and patient/family.  CBC: Recent Labs  Lab 05/08/22 0526 05/09/22 0539 05/10/22 0334 05/11/22 0711 05/12/22 0524  WBC 11.2* 9.7 12.2* 11.9* 11.8*  HGB 11.5* 11.4* 11.8* 11.2* 12.0  HCT 37.8 37.6 38.7 35.7* 38.9  MCV 80.9 81.6 81.6 79.5* 81.0  PLT 227 240 260 256 281    Basic Metabolic Panel: Recent Labs  Lab 05/10/22 0334 05/11/22 0711 05/12/22 0524 05/13/22 0422 05/14/22 0516  NA 134* 131* 135 133* 136  K 4.0 3.0* 4.2 4.2 4.1  CL 92* 85* 88* 90* 94*  CO2 35* 35* 37* 35* 30  GLUCOSE 236* 206* 243* 271* 223*  BUN 42* 52* 64* 63* 61*  CREATININE 0.87 1.02* 1.04* 1.19* 0.96  CALCIUM 8.2* 8.4* 9.3 9.1 8.9     Studies: No results found.  Scheduled Meds:  acetaminophen  650 mg Oral Q8H   aspirin EC  81 mg Oral Daily   atorvastatin  10 mg Oral Daily   buprenorphine-naloxone  1 tablet Sublingual TID   vitamin B-12  1,000 mcg Oral Daily   enoxaparin (LOVENOX) injection  0.5 mg/kg Subcutaneous Q24H   insulin aspart  0-20 Units Subcutaneous TID AC & HS   insulin aspart  25 Units Subcutaneous TID WC   insulin glargine-yfgn  80 Units Subcutaneous BID   irbesartan  75 mg Oral Daily   metolazone  5 mg Oral Daily   sodium chloride flush  3 mL Intravenous Q12H   spironolactone  25 mg Oral Daily   torsemide  40 mg Oral Daily   Vitamin D (Ergocalciferol)  50,000 Units Oral Q7 days   Continuous Infusions:  sodium chloride     PRN Meds: sodium chloride, acetaminophen, diclofenac Sodium, fluticasone, [DISCONTINUED] hydrALAZINE **OR** hydrALAZINE, hydrOXYzine, ondansetron (ZOFRAN) IV, oxyCODONE, sodium chloride flush  Time spent: 45 minutes  Author: Arnetha Courser.  MD [facility] 05/14/2022 3:07 PM  To reach On-call, see care teams to locate the attending and reach out to them via [email or web address]. If 7PM-7AM, please contact night-coverage If you still have difficulty reaching the attending provider, please page the [facility] (Director on Call) for [facility] on amion for assistance.

## 2022-05-15 ENCOUNTER — Other Ambulatory Visit (HOSPITAL_COMMUNITY): Payer: Self-pay

## 2022-05-15 ENCOUNTER — Telehealth (HOSPITAL_COMMUNITY): Payer: Self-pay | Admitting: Pharmacy Technician

## 2022-05-15 DIAGNOSIS — E1165 Type 2 diabetes mellitus with hyperglycemia: Secondary | ICD-10-CM | POA: Diagnosis not present

## 2022-05-15 DIAGNOSIS — N179 Acute kidney failure, unspecified: Secondary | ICD-10-CM

## 2022-05-15 DIAGNOSIS — G4733 Obstructive sleep apnea (adult) (pediatric): Secondary | ICD-10-CM

## 2022-05-15 DIAGNOSIS — I5023 Acute on chronic systolic (congestive) heart failure: Secondary | ICD-10-CM | POA: Diagnosis not present

## 2022-05-15 DIAGNOSIS — I10 Essential (primary) hypertension: Secondary | ICD-10-CM | POA: Diagnosis not present

## 2022-05-15 DIAGNOSIS — I5033 Acute on chronic diastolic (congestive) heart failure: Secondary | ICD-10-CM | POA: Diagnosis not present

## 2022-05-15 LAB — GLUCOSE, CAPILLARY
Glucose-Capillary: 129 mg/dL — ABNORMAL HIGH (ref 70–99)
Glucose-Capillary: 185 mg/dL — ABNORMAL HIGH (ref 70–99)
Glucose-Capillary: 197 mg/dL — ABNORMAL HIGH (ref 70–99)
Glucose-Capillary: 221 mg/dL — ABNORMAL HIGH (ref 70–99)
Glucose-Capillary: 223 mg/dL — ABNORMAL HIGH (ref 70–99)
Glucose-Capillary: 266 mg/dL — ABNORMAL HIGH (ref 70–99)

## 2022-05-15 LAB — BASIC METABOLIC PANEL
Anion gap: 8 (ref 5–15)
BUN: 65 mg/dL — ABNORMAL HIGH (ref 6–20)
CO2: 34 mmol/L — ABNORMAL HIGH (ref 22–32)
Calcium: 8.9 mg/dL (ref 8.9–10.3)
Chloride: 89 mmol/L — ABNORMAL LOW (ref 98–111)
Creatinine, Ser: 1.11 mg/dL — ABNORMAL HIGH (ref 0.44–1.00)
GFR, Estimated: >60 mL/min (ref >60)
Glucose, Bld: 332 mg/dL — ABNORMAL HIGH (ref 70–99)
Potassium: 4.4 mmol/L (ref 3.5–5.1)
Sodium: 131 mmol/L — ABNORMAL LOW (ref 135–145)

## 2022-05-15 LAB — BRAIN NATRIURETIC PEPTIDE: B Natriuretic Peptide: 86.5 pg/mL (ref 0.0–100.0)

## 2022-05-15 MED ORDER — INSULIN ASPART 100 UNIT/ML IJ SOLN
30.0000 [IU] | Freq: Three times a day (TID) | INTRAMUSCULAR | Status: DC
  Administered 2022-05-16 – 2022-05-20 (×4): 30 [IU] via SUBCUTANEOUS
  Filled 2022-05-15 (×6): qty 1

## 2022-05-15 MED ORDER — FUROSEMIDE 10 MG/ML IJ SOLN
80.0000 mg | Freq: Two times a day (BID) | INTRAMUSCULAR | Status: DC
  Administered 2022-05-15 – 2022-05-16 (×4): 80 mg via INTRAVENOUS
  Filled 2022-05-15 (×4): qty 8

## 2022-05-15 MED ORDER — DAPAGLIFLOZIN PROPANEDIOL 10 MG PO TABS
10.0000 mg | ORAL_TABLET | Freq: Every day | ORAL | Status: DC
  Administered 2022-05-15 – 2022-05-20 (×6): 10 mg via ORAL
  Filled 2022-05-15 (×6): qty 1

## 2022-05-15 MED ORDER — ACETAZOLAMIDE 250 MG PO TABS
250.0000 mg | ORAL_TABLET | Freq: Two times a day (BID) | ORAL | Status: DC
  Administered 2022-05-15 – 2022-05-16 (×4): 250 mg via ORAL
  Filled 2022-05-15 (×5): qty 1

## 2022-05-15 NOTE — Progress Notes (Signed)
ReDS Vest / Clip - 05/15/22 0900       ReDS Vest / Clip   Station Marker B    Ruler Value 42    ReDS Value Range Moderate volume overload    ReDS Actual Value 38

## 2022-05-15 NOTE — TOC Benefit Eligibility Note (Signed)
Patient Product/process development scientist completed.    The patient is currently admitted and upon discharge could be taking Farxiga 10 mg.  Prior Authorization Required  The patient is insured through [employer] [place] [employer]   This test claim was processed through [facility]- copay amounts may vary at other pharmacies due to pharmacy/plan contracts, or as the patient moves through the different stages of their insurance plan.  Roland Earl, CPHT Pharmacy Patient Advocate Specialist [facility] Patient Advocate Team Direct Number: [phone]  Fax: [phone]

## 2022-05-15 NOTE — Consult Note (Signed)
Advanced Heart Failure Team Consult Note   Primary Physician: [facility] [employer] [facility] PCP-Cardiologist:  None  Reason for Consultation: CHF  HPI:    Caitlyn Fuller is seen today for evaluation of CHF at the request of Dr. Azucena Cecil.   47 y.o. with history of HFpEF, likely OSA, HTN, DM, chronic pain on suboxone, h/o substance abuse was admitted with CHF exacerbation.  Patient had prior CHF exacerbation admissions in 8/23 and 11/23.  Was referred to CHF clinic but apparently no-showed 6 times.  Most recent echo in 4/24 showed E 60-65%, mild LVH, normal RV.  At home, she is on Lasix 40 mg daily, insulin, and irbesartan.  She has a substance abuse history, apparently cocaine and opiates.   She was admitted after several weeks and dyspnea and swelling, says her baseline weight is around 220.  She was 240 at admission, today weighing in at 260.  She has been here since 4/22. She has been on Lasix boluses, then on Lasix gtt up to 8 mg/hr.  Currently ordered for torsemide and metolazone.  She still feels swollen. She has some pleuritic chest soreness.  Feels like she still has a lot of fluid on her, legs remain swollen.  She has significant daytime sleepiness and snoring.  BUN has been elevated, creatinine 1.11 this morning.   Review of Systems: All systems reviewed and negative except as per HPI.   Home Medications Prior to Admission medications   Medication Sig Start Date End Date Taking? Authorizing Provider  blood glucose meter kit and supplies KIT Dispense based on patient and insurance preference. Check sugar 4 times a day before meals and bedtime 11/22/21  Yes Wieting, Richard, MD  buprenorphine-naloxone (SUBOXONE) 8-2 mg SUBL SL tablet Place 1 tablet under the tongue 3 (three) times daily.   Yes [provider]  furosemide (LASIX) 40 MG tablet Take 1 tablet (40 mg total) by mouth daily. 11/22/21  Yes Wieting, Richard, MD  insulin aspart (NOVOLOG) 100 UNIT/ML  injection 15 units subcutaneous injection prior to meals Patient taking differently: Inject 15 Units into the skin 3 (three) times daily with meals. 11/22/21  Yes Wieting, Richard, MD  insulin detemir (LEVEMIR) 100 UNIT/ML FlexPen Inject 50 Units into the skin at bedtime. 11/22/21  Yes Wieting, Richard, MD  Insulin Pen Needle 33G X 5 MM MISC 1 Dose by Does not apply route 3 (three) times daily before meals. 11/22/21  Yes Wieting, Richard, MD  irbesartan (AVAPRO) 75 MG tablet Take 1 tablet (75 mg total) by mouth daily. 02/12/22  Yes Kirby Crigler, Mir M, MD  aspirin EC 81 MG tablet Take 1 tablet (81 mg total) by mouth daily. Swallow whole. Patient not taking: Reported on 02/09/2022 11/22/21   Alford Highland, MD  atorvastatin (LIPITOR) 10 MG tablet Take 1 tablet (10 mg total) by mouth daily. Patient not taking: Reported on 02/09/2022 11/22/21   Alford Highland, MD  potassium chloride SA (KLOR-CON M) 20 MEQ tablet Take 1 tablet (20 mEq total) by mouth daily. 02/11/22 03/13/22  Maryln Gottron, MD    Past Medical History: Past Medical History:  Diagnosis Date   Asthma    CHF (congestive heart failure) (HCC)    Chronic heart failure with preserved ejection fraction (HFpEF) (HCC)    a. 04/2021 Echo: EF 65-70%, no rwma, GrI DD, mildly dil LA; b. 11/2021 Echo: EF 60-65%, no rwma, mod LVH, GrII DD, nl RV fxn, mildly dil LA, mild MR.   Diabetes mellitus without  complication (HCC)    Hernia    Hypertension    Kidney infection    Morbid obesity (HCC)    Shingles     Past Surgical History: Past Surgical History:  Procedure Laterality Date   ABDOMINAL SURGERY     CESAREAN SECTION     CHOLECYSTECTOMY     colonscopy     TUBAL LIGATION      Family History: Family History  Problem Relation Age of Onset   Cancer Mother    Heart failure Father     Social History: Social History   Socioeconomic History   Marital status: Single    Spouse name: Not on file   Number of children: Not on file   Years  of education: Not on file   Highest education level: Not on file  Occupational History   Not on file  Tobacco Use   Smoking status: Former    Packs/day: .5    Types: Cigarettes   Smokeless tobacco: Never  Vaping Use   Vaping Use: Never used  Substance and Sexual Activity   Alcohol use: No   Drug use: Yes    Types: "Crack" cocaine   Sexual activity: Not on file  Other Topics Concern   Not on file  Social History Narrative   Not on file   Social Determinants of Health   Financial Resource Strain: Not on file  Food Insecurity: No Food Insecurity (05/02/2022)   Hunger Vital Sign    Worried About Running Out of Food in the Last Year: Never true    Ran Out of Food in the Last Year: Never true  Transportation Needs: No Transportation Needs (05/02/2022)   PRAPARE - Administrator, Civil Service (Medical): No    Lack of Transportation (Non-Medical): No  Physical Activity: Not on file  Stress: Not on file  Social Connections: Not on file    Allergies:  Allergies  Allergen Reactions   Amlodipine Swelling   Gabapentin Anaphylaxis   Hydralazine Shortness Of Breath    Patient felt she had panic attack after IV hydralazine administered   Morphine And Related Anaphylaxis   Darvocet [Propoxyphene N-Acetaminophen] Hives   Toradol [Ketorolac Tromethamine] Other (See Comments)    migraines   Ultram [Tramadol] Hives   Vancomycin Itching    Objective:    Vital Signs:   Temp:  [97.6 F (36.4 C)-98.9 F (37.2 C)] 98 F (36.7 C) (05/06 0718) Pulse Rate:  [85-94] 85 (05/06 0718) Resp:  [16-18] 18 (05/06 0718) BP: (135-172)/(65-88) 147/88 (05/06 0718) SpO2:  [95 %-100 %] 95 % (05/06 0718) Weight:  [116.8 kg-118 kg] 118 kg (05/06 0410) Last BM Date : 05/14/22  Weight change: Filed Weights   05/14/22 0433 05/14/22 0945 05/15/22 0410  Weight: 117.5 kg 116.8 kg 118 kg    Intake/Output:   Intake/Output Summary (Last 24 hours) at 05/15/2022 0901 Last data filed at  05/15/2022 0419 Gross per 24 hour  Intake 243 ml  Output 700 ml  Net -457 ml      Physical Exam    General:  Well appearing. No resp difficulty HEENT: normal Neck: Thick.  JVP very difficult, suspect elevated. Carotids 2+ bilat; no bruits. No lymphadenopathy or thyromegaly appreciated. Cor: PMI nondisplaced. Regular rate & rhythm. No rubs, gallops or murmurs. Lungs: clear Abdomen: soft, nontender, mildly distended. No hepatosplenomegaly. No bruits or masses. Good bowel sounds. Extremities: no cyanosis, clubbing, rash. 1+ edema to knees.  Neuro: alert & orientedx3, cranial nerves  grossly intact. moves all 4 extremities w/o difficulty. Affect pleasant   Telemetry   NSR (personally reviewed)  EKG    NSR, anteroseptal Qs (personally reviewed)  Labs   Basic Metabolic Panel: Recent Labs  Lab 05/11/22 0711 05/12/22 0524 05/13/22 0422 05/14/22 0516 05/15/22 0415  NA 131* 135 133* 136 131*  K 3.0* 4.2 4.2 4.1 4.4  CL 85* 88* 90* 94* 89*  CO2 35* 37* 35* 30 34*  GLUCOSE 206* 243* 271* 223* 332*  BUN 52* 64* 63* 61* 65*  CREATININE 1.02* 1.04* 1.19* 0.96 1.11*  CALCIUM 8.4* 9.3 9.1 8.9 8.9    Liver Function Tests: No results for input(s): "AST", "ALT", "ALKPHOS", "BILITOT", "PROT", "ALBUMIN" in the last 168 hours. No results for input(s): "LIPASE", "AMYLASE" in the last 168 hours. No results for input(s): "AMMONIA" in the last 168 hours.  CBC: Recent Labs  Lab 05/09/22 0539 05/10/22 0334 05/11/22 0711 05/12/22 0524  WBC 9.7 12.2* 11.9* 11.8*  HGB 11.4* 11.8* 11.2* 12.0  HCT 37.6 38.7 35.7* 38.9  MCV 81.6 81.6 79.5* 81.0  PLT 240 260 256 281    Cardiac Enzymes: No results for input(s): "CKTOTAL", "CKMB", "CKMBINDEX", "TROPONINI" in the last 168 hours.  BNP: BNP (last 3 results) Recent Labs    11/25/21 1631 12/01/21 0532 05/01/22 1518  BNP 192.6* 26.8 142.2*    ProBNP (last 3 results) No results for input(s): "PROBNP" in the last 8760  hours.   CBG: Recent Labs  Lab 05/14/22 1754 05/14/22 2103 05/14/22 2119 05/15/22 0020 05/15/22 0719  GLUCAP 244* 213* 256* 221* 266*    Coagulation Studies: No results for input(s): "LABPROT", "INR" in the last 72 hours.   Imaging   No results found.   Medications:     Current Medications:  acetaminophen  650 mg Oral Q8H   acetaZOLAMIDE  250 mg Oral BID   aspirin EC  81 mg Oral Daily   atorvastatin  10 mg Oral Daily   buprenorphine-naloxone  1 tablet Sublingual TID   vitamin B-12  1,000 mcg Oral Daily   dapagliflozin propanediol  10 mg Oral Daily   enoxaparin (LOVENOX) injection  0.5 mg/kg Subcutaneous Q24H   furosemide  80 mg Intravenous BID   insulin aspart  0-20 Units Subcutaneous TID AC & HS   insulin aspart  25 Units Subcutaneous TID WC   insulin glargine-yfgn  80 Units Subcutaneous BID   irbesartan  75 mg Oral Daily   sodium chloride flush  3 mL Intravenous Q12H   spironolactone  25 mg Oral Daily   Vitamin D (Ergocalciferol)  50,000 Units Oral Q7 days    Infusions:  sodium chloride       Assessment/Plan   1. Acute on chronic diastolic CHF: Echo in 4/24 with EF 60-65%, mild LVH, normal RV.  She was admitted about 2 wks ago with weight gain and exertional dyspnea.  Her weight has not changed appreciably it does not look like with diuresis so far.  She still feels "swollen."  Exam is difficult for volume though I suspect that she remains volume overloaded.  BUN is high though creatinine only 1.11.  I think this is significant RV failure, possibly with pulmonary hypertension.  She has very poor understanding of CHF/current health status.  - She needs right heart catheterization at this point.  I discussed this with her.  She is very reluctant to undergo a procedure (has been recommended and refused at prior admission as well). I told her we  will adjust medications today to try for more diuresis and I will readdress RHC with her tomorrow.  - I will get a REDS  reading for her.  - Lasix 80 mg IV bid today with acetazolamide 250 mg bid.   - Start Farxiga 10 mg daily.  Suspect benefit will outweigh GU risk.  - Continue spironolactone 25 mg daily.  - Need to get accurate I/Os and standing weights.  - She needs close outpatient followup, will see if we can get her help with transportation (no car).  2. AKI: Primarily elevated BUN.  Suspect cardiorenal syndrome with significant RV failure.  - Follow BMET carefully with diuresis.  3. Morbid obesity: Would suggest GLP-1 agonist as outpatient.  4. HTN: Continue spironolactone 25 and irbesartan 75.  5. OSA: Strong suspicion for this.  Needs sleep study as outpatient.   Length of Stay: 56  Marca Ancona, MD  05/15/2022, 9:01 AM  Advanced Heart Failure Team Pager [phone] (M-F; 7a - 5p)  Please contact [facility] for night-coverage after hours (4p -7a ) and weekends on [email or web address]

## 2022-05-15 NOTE — Progress Notes (Signed)
[facility]  ROUNDING NOTE   Subjective:   Patient seen sitting up in bed Continues to have discomfort in abd and chest Reports no discomfort in lower back  Lower extremity edema improved Creatinine 1.11   Objective:  Vital signs in last 24 hours:  Temp:  [97.6 F (36.4 C)-98.9 F (37.2 C)] 98 F (36.7 C) (05/06 0718) Pulse Rate:  [85-94] 85 (05/06 0718) Resp:  [16-18] 18 (05/06 0718) BP: (135-172)/(65-88) 147/88 (05/06 0718) SpO2:  [95 %-100 %] 95 % (05/06 0718) Weight:  [161 kg-118.2 kg] 118.2 kg (05/06 0900)  Weight change: -0.68 kg Filed Weights   05/14/22 0945 05/15/22 0410 05/15/22 0900  Weight: 116.8 kg 118 kg 118.2 kg    Intake/Output: I/O last 3 completed shifts: In: 246 [P.O.:240; I.V.:6] Out: 700 [Urine:700]   Intake/Output this shift:  No intake/output data recorded.  Physical Exam: General: NAD  Head: Normocephalic, atraumatic. Moist oral mucosal membranes  Eyes: Anicteric  Lungs:  Clear to auscultation, normal effort  Heart: Regular rate and rhythm  Abdomen:  Soft, nontender  Extremities:  1+ peripheral edema  Neurologic: Alert and oriented, moving all four extremities  Skin: No lesions  Access: None    Basic Metabolic Panel: Recent Labs  Lab 05/11/22 0711 05/12/22 0524 05/13/22 0422 05/14/22 0516 05/15/22 0415  NA 131* 135 133* 136 131*  K 3.0* 4.2 4.2 4.1 4.4  CL 85* 88* 90* 94* 89*  CO2 35* 37* 35* 30 34*  GLUCOSE 206* 243* 271* 223* 332*  BUN 52* 64* 63* 61* 65*  CREATININE 1.02* 1.04* 1.19* 0.96 1.11*  CALCIUM 8.4* 9.3 9.1 8.9 8.9     Liver Function Tests: No results for input(s): "AST", "ALT", "ALKPHOS", "BILITOT", "PROT", "ALBUMIN" in the last 168 hours. No results for input(s): "LIPASE", "AMYLASE" in the last 168 hours. No results for input(s): "AMMONIA" in the last 168 hours.  CBC: Recent Labs  Lab 05/09/22 0539 05/10/22 0334 05/11/22 0711 05/12/22 0524  WBC 9.7 12.2* 11.9* 11.8*  HGB 11.4* 11.8*  11.2* 12.0  HCT 37.6 38.7 35.7* 38.9  MCV 81.6 81.6 79.5* 81.0  PLT 240 260 256 281     Cardiac Enzymes: No results for input(s): "CKTOTAL", "CKMB", "CKMBINDEX", "TROPONINI" in the last 168 hours.  BNP: Invalid input(s): "POCBNP"  CBG: Recent Labs  Lab 05/14/22 1754 05/14/22 2103 05/14/22 2119 05/15/22 0020 05/15/22 0719  GLUCAP 244* 213* 256* 221* 266*     Microbiology: Results for orders placed or performed during the hospital encounter of 11/16/21  SARS Coronavirus 2 by RT PCR (hospital order, performed in [facility] hospital lab) *cepheid single result test* Anterior Nasal Swab     Status: None   Collection Time: 11/16/21  5:13 PM   Specimen: Anterior Nasal Swab  Result Value Ref Range Status   SARS Coronavirus 2 by RT PCR NEGATIVE NEGATIVE Final    Comment: (NOTE) SARS-CoV-2 target nucleic acids are NOT DETECTED.  The SARS-CoV-2 RNA is generally detectable in upper and lower respiratory specimens during the acute phase of infection. The lowest concentration of SARS-CoV-2 viral copies this assay can detect is 250 copies / mL. A negative result does not preclude SARS-CoV-2 infection and should not be used as the sole basis for treatment or other patient management decisions.  A negative result may occur with improper specimen collection / handling, submission of specimen other than nasopharyngeal swab, presence of viral mutation(s) within the areas targeted by this assay, and inadequate number of viral copies (<  250 copies / mL). A negative result must be combined with clinical observations, patient history, and epidemiological information.  Fact Sheet for Patients:   RoadLapTop.co.za  Fact Sheet for Healthcare Providers: http://kim-miller.com/  This test is not yet approved or  cleared by the [place] FDA and has been authorized for detection and/or diagnosis of SARS-CoV-2 by FDA under an Emergency Use  Authorization (EUA).  This EUA will remain in effect (meaning this test can be used) for the duration of the COVID-19 declaration under Section 564(b)(1) of the Act, 21 U.S.C. section 360bbb-3(b)(1), unless the authorization is terminated or revoked sooner.  Performed at [facility], [address] [place], [place] [address]     Coagulation Studies: No results for input(s): "LABPROT", "INR" in the last 72 hours.  Urinalysis: No results for input(s): "COLORURINE", "LABSPEC", "PHURINE", "GLUCOSEU", "HGBUR", "BILIRUBINUR", "KETONESUR", "PROTEINUR", "UROBILINOGEN", "NITRITE", "LEUKOCYTESUR" in the last 72 hours.  Invalid input(s): "APPERANCEUR"    Imaging: No results found.   Medications:    sodium chloride      acetaminophen  650 mg Oral Q8H   acetaZOLAMIDE  250 mg Oral BID   aspirin EC  81 mg Oral Daily   atorvastatin  10 mg Oral Daily   buprenorphine-naloxone  1 tablet Sublingual TID   vitamin B-12  1,000 mcg Oral Daily   dapagliflozin propanediol  10 mg Oral Daily   enoxaparin (LOVENOX) injection  0.5 mg/kg Subcutaneous Q24H   furosemide  80 mg Intravenous BID   insulin aspart  0-20 Units Subcutaneous TID AC & HS   insulin aspart  30 Units Subcutaneous TID WC   insulin glargine-yfgn  80 Units Subcutaneous BID   irbesartan  75 mg Oral Daily   sodium chloride flush  3 mL Intravenous Q12H   spironolactone  25 mg Oral Daily   Vitamin D (Ergocalciferol)  50,000 Units Oral Q7 days   sodium chloride, acetaminophen, diclofenac Sodium, fluticasone, [DISCONTINUED] hydrALAZINE **OR** hydrALAZINE, hydrOXYzine, ondansetron (ZOFRAN) IV, oxyCODONE, sodium chloride flush  Assessment/ Plan:  Ms. SHELETHA FONVILLE is a 47 y.o.  female  with past medical conditions including uncontrolled type 2 diabetes, hypertension, obesity, heart failure, polysubstance abuse, who was admitted to [facility] on 05/01/2022 for Anasarca [R60.1] Hypertensive urgency [I16.0] Acute on chronic  systolic CHF (congestive heart failure) (HCC) [I50.23] Hypervolemia, unspecified hypervolemia type [E87.70] Uncontrolled type 2 diabetes mellitus with hyperglycemia (HCC) [E11.65] Acute on chronic heart failure with preserved ejection fraction (HFpEF) (HCC) [I50.33]   Chronic kidney disease stage II likely secondary to uncontrolled hypertension and type 2 diabetes.  Baseline creatinine appears to be 0.88 with GFR 82 on 12/12/2021.  Renal ultrasound negative for obstruction or echogenicity.    Creatinine slightly elevated today, reflective of metolazone dosing yesterday. Explained to patient that kidney function will fluctuate with diuretic therapy. Will schedule appt with our office at discharge to monitor.   2. Acute on chronic diastolic heart failure with volume overload.  Echo from 05/08/2022 shows EF 60 to 65% with grade 2 diastolic dysfunction.  Cardiology following.   Remains on Torsemide and metolazone.    3.  Acute metabolic alkalosis, serum bicarb increased to 35.  Likely due to aggressive diuresis.   4.  Hyponatremia/hypokalemia.  Oral supplementation by primary team yesterday.  Decreased sodium likely due to diuresis.  Sodium 131 today  5. Anemia of chronic kidney disease Lab Results  Component Value Date   HGB 12.0 05/12/2022    Hgb within desired range.   6. Diabetes  mellitus type II with chronic kidney disease/renal manifestations: insulin dependent. Home regimen includes NovoLog and Levemir. Most recent hemoglobin A1c is 14.3 on 05/02/22.   Glucose elevated at times. Primary team to manage SSI   7.  Hypertension with chronic kidney disease.  Home regimen includes irbesartan and furosemide.  Currently receiving these medications.    LOS: 13 Caitlyn Fuller 5/6/202410:56 AM

## 2022-05-15 NOTE — Telephone Encounter (Signed)
Pharmacy Patient Advocate Encounter  Insurance verification completed.    The patient is insured through [employer] [place] [employer]   The patient is currently admitted and ran test claims for the following: Farxiga.  Copays and coinsurance results were relayed to Inpatient clinical team.

## 2022-05-15 NOTE — Progress Notes (Signed)
[facility] Progress Note  Patient: Caitlyn Fuller    WGN:[id number]  DOA: 05/01/2022     Date of Service: the patient was seen and examined on 05/15/2022  Chief Complaint  Patient presents with   Hyperglycemia   Brief hospital course: Caitlyn Fuller is a 47 y.o. female with medical history significant of HFpEF, insulin-dependent type 2 diabetes, hypertension, polysubstance abuse, chronic pain syndrome, morbid obesity, who presents to the ED with complaints of elevated blood sugar.  Patient is noncompliant with her medications  5/3: Vitals with elevated blood pressure at 157/83.  Renal function and alkalosis seems stable.  Most likely had pseudohyponatremia as CBG remained elevated.  No urinary output recorded but according to patient she is having good urinary output.  Nephrology is on board.  Volume status improving but still appears volume up.  Remained on Lasix infusion. Patient was on oxycodone and Suboxone both.  Became little irritable when asked why she is taking both, stating that she was taking Suboxone for other reasons.  She was requesting to increase the dose of oxycodone due to uncontrolled pain, stating that she was taking 15 mg at home and here she was getting only 5 mg.  Dose increased to 10 mg.  Need to discontinue Suboxone if she remained on oxycodone.  5/4: Vital stable.  Slight worsening of creatinine so Lasix infusion was discontinued.  Patient became very upset and rude when told that she might be going home soon.  Continue to have significant lower extremity edema.  Patient is being noncompliant with her fluid restrictions while in the hospital, apparently family members are bringing different drinks.  She was not saving most of her urine so no strict intake and output recorded.  She was very threatening with the potential discharge and rudely saying that she wanted a different physician and second opinion and asking me to get out of the room.  Charge nurse  notified.   Cardiology was also consulted-she is a known to the practice and never showed up for her follow-up appointments. Cardiology is recommending trying 1 dose of IV Lasix with metolazone with close monitoring of renal function for 1 day.  5/5: Hemodynamically stable.  No strict in and out recorded. Nephrology started her on torsemide and metolazone was also added.  Improving lower extremity edema but continued to have 2+.  She was having excessive daytime sleepiness -likely some element of sleep apnea due to her weight, she will need outpatient sleep study.  5/6: Remained hemodynamically stable.  Weight with some minor increase which does not make sense.  Heart failure team is not recommending right heart catheterization for measurements of her pressure but she refused.  They are going to try little higher doses of Lasix with metolazone to see her response.  Continue to have lower extremity edema.  Assessment and Plan:  # Hyperglycemia, uncontrolled diabetes mellitus due to noncompliance IDDM T2 5/6: CBG again elevated, increasing mealtime coverage to 30 units. 5/5.  1 episode of hypoglycemia requiring p.o. juice, overall CBGs stable and improving.  Continuing current regimen with close monitoring. 5/3: Increase Semglee to 80 units twice daily and continue rest 4/28 increased  Semglee 75 units twice daily 4/27 Increased NovoLog 25 units 3 times daily and sliding scale 4/26 NovoLog 10 units IV given due to high blood sugar Monitor CBG, continue diabetic diet Diabetic coordinator consulted for further management and counseling   # Anasarca, diastolic CHF patient has significant bilateral lower extremity edema D-dimer negative, venous  duplex negative for DVT US renal: The kidneys are unremarkable. A large postvoid residual of 108 cc is identified. The bladder is otherwise unremarkable. Monitor intake output and renal functions, continue fluid restriction 1.5 L/day-patient is  noncompliant She was initially started on IV Lasix followed by Lasix infusion.  Did develop contraction alkalosis due to high rates and nephrology was consulted.  Rate was decreased.  Per patient she is peeing good but not providing all the urine for measurement.  Not following the directions for fluid restriction. Patient remained on Lasix infusion for many days and discontinued today due to mild hyponatremia and some worsening of creatinine. Cardiology was also consulted due to her threatening behavior. Apparently she was noncompliant and not following as outpatient.  Heart failure team is not recommending right heart cath but she refused. -Trial of high-dose of IV Lasix and metolazone -Continue with strict intake and output if she cooperates. -Daily weight and BMP -Spironolactone was also added  # Accelerated hypertension  Uncontrolled blood pressure, BP 197/66 on admission Continue irbesartan 75 mg p.o. daily home dose Use hydralazine p.o. or IV as needed as per parameters S/p Lasix 40 mg IV daily Monitor BP and titrate medications accordingly 4/24 --4/28 s/p Lopressor 50 mg p.o. BID, discontinued, patient was feeling dizziness after taking medication.   4/28 started Coreg 6.25 mg p.o. twice daily 4/29 blood pressures soft now, patient refused coreg last night, so discontinued 5/3: Blood pressure remained mildly elevated.  We will continue irbesartan and monitor  # Chronic pain Patient endorses chronic pain for which she states she was previously on Percocet.  PDMP reviewed and last prescription for oxycodone was in November 2023. - Conservative management and recommend outpatient follow-up with PCP 4/28 patient stated no allergy to Tylenol, so started Tylenol 650 mg p.o. every 6 hourly as needed 4/29 started oxycodone 5 mg p.o. every 6 hours as needed 5/2: Oxycodone dose increased to 10 mg p.o. every 6 hourly as needed.  Suboxone needs to be discontinued, patient became very irritable  stating that she is using Suboxone for other reasons.  # Polysubstance abuse - Continue home Suboxone  # Sore throat Patient states that she developed perioral erythema and lesions on her tongue after taking a new blood pressure medicine last night.  At this time, she continues to have a sore throat.  On exam, she has some mild erythema, but no edema. - Supportive management  # Obesity Body mass index is 46.89 kg/m.  Interventions: Calorie restricted diet and daily exercise advised to lose body weight.  Lifestyle modification discussed.   # Vitamin D deficiency: started vitamin D 50,000 units p.o. weekly, follow with PCP to repeat vitamin D level after 3 to 6 months. #Vitamin B12 level 205 at lower end, goal >400, started vitamin B12 1000 mcg IM injection daily for 7 days during hospital stay followed by oral supplement at discharge.  Follow with PCP to repeat B12 level after 3 to 6 months.     Diet: Diabetic diet, fluid restriction 1.5 L/day DVT Prophylaxis: Subcutaneous Lovenox   Advance goals of care discussion: Full code  Family Communication: Discussed with patient and daughter was present at bedside  Disposition:  Pt is from Home, admitted with diabetes,  accelerated blood pressure, and anasarca, still has lower extremity edema, on IV Lasix infusion, which precludes a safe discharge. Discharge to home, when clinically stable.  Subjective: Patient was seen and examined today.  Stating that she does not feel cold and still  feels she is significantly volume up.  Continue to refuse RHC.  Physical Exam: General.  Morbidly obese lady, in no acute distress. Pulmonary.  Lungs clear bilaterally, normal respiratory effort. CV.  Regular rate and rhythm, no JVD, rub or murmur. Abdomen.  Soft, nontender, nondistended, BS positive. CNS.  Alert and oriented .  No focal neurologic deficit. Extremities.  2+ LE edema, no cyanosis, pulses intact and symmetrical. Psychiatry.  Judgment and  insight appears normal.   Vitals:   05/15/22 0718 05/15/22 0900 05/15/22 1135 05/15/22 1521  BP: (!) 147/88  (!) 150/83 (!) 150/91  Pulse: 85  83 91  Resp: 18  20 18   Temp: 98 F (36.7 C)  97.9 F (36.6 C) 97.7 F (36.5 C)  TempSrc: Oral   Oral  SpO2: 95%  100% 97%  Weight:  118.2 kg      Intake/Output Summary (Last 24 hours) at 05/15/2022 1613 Last data filed at 05/15/2022 1518 Gross per 24 hour  Intake 1743 ml  Output 1400 ml  Net 343 ml    Filed Weights   05/14/22 0945 05/15/22 0410 05/15/22 0900  Weight: 116.8 kg 118 kg 118.2 kg    Data Reviewed: I have personally reviewed and interpreted daily labs, tele strips, imagings as discussed above. I reviewed all nursing notes, pharmacy notes, vitals, pertinent old records I have discussed plan of care as described above with RN and patient/family.  CBC: Recent Labs  Lab 05/09/22 0539 05/10/22 0334 05/11/22 0711 05/12/22 0524  WBC 9.7 12.2* 11.9* 11.8*  HGB 11.4* 11.8* 11.2* 12.0  HCT 37.6 38.7 35.7* 38.9  MCV 81.6 81.6 79.5* 81.0  PLT 240 260 256 281    Basic Metabolic Panel: Recent Labs  Lab 05/11/22 0711 05/12/22 0524 05/13/22 0422 05/14/22 0516 05/15/22 0415  NA 131* 135 133* 136 131*  K 3.0* 4.2 4.2 4.1 4.4  CL 85* 88* 90* 94* 89*  CO2 35* 37* 35* 30 34*  GLUCOSE 206* 243* 271* 223* 332*  BUN 52* 64* 63* 61* 65*  CREATININE 1.02* 1.04* 1.19* 0.96 1.11*  CALCIUM 8.4* 9.3 9.1 8.9 8.9     Studies: No results found.  Scheduled Meds:  acetaminophen  650 mg Oral Q8H   acetaZOLAMIDE  250 mg Oral BID   aspirin EC  81 mg Oral Daily   atorvastatin  10 mg Oral Daily   buprenorphine-naloxone  1 tablet Sublingual TID   vitamin B-12  1,000 mcg Oral Daily   dapagliflozin propanediol  10 mg Oral Daily   enoxaparin (LOVENOX) injection  0.5 mg/kg Subcutaneous Q24H   furosemide  80 mg Intravenous BID   insulin aspart  0-20 Units Subcutaneous TID AC & HS   insulin aspart  30 Units Subcutaneous TID WC    insulin glargine-yfgn  80 Units Subcutaneous BID   irbesartan  75 mg Oral Daily   sodium chloride flush  3 mL Intravenous Q12H   spironolactone  25 mg Oral Daily   Vitamin D (Ergocalciferol)  50,000 Units Oral Q7 days   Continuous Infusions:  sodium chloride     PRN Meds: sodium chloride, acetaminophen, diclofenac Sodium, fluticasone, [DISCONTINUED] hydrALAZINE **OR** hydrALAZINE, hydrOXYzine, ondansetron (ZOFRAN) IV, oxyCODONE, sodium chloride flush  Time spent: 44 minutes  Author: Arnetha Courser. MD [facility] 05/15/2022 4:13 PM  To reach On-call, see care teams to locate the attending and reach out to them via [email or web address]. If 7PM-7AM, please contact night-coverage If you still have difficulty reaching the attending provider, please page  the [facility] (Director on Call) for [facility] on amion for assistance.

## 2022-05-16 DIAGNOSIS — E1165 Type 2 diabetes mellitus with hyperglycemia: Secondary | ICD-10-CM | POA: Diagnosis not present

## 2022-05-16 DIAGNOSIS — I5023 Acute on chronic systolic (congestive) heart failure: Secondary | ICD-10-CM | POA: Diagnosis not present

## 2022-05-16 DIAGNOSIS — I10 Essential (primary) hypertension: Secondary | ICD-10-CM | POA: Diagnosis not present

## 2022-05-16 DIAGNOSIS — I5033 Acute on chronic diastolic (congestive) heart failure: Secondary | ICD-10-CM | POA: Diagnosis not present

## 2022-05-16 LAB — BASIC METABOLIC PANEL
Anion gap: 12 (ref 5–15)
BUN: 78 mg/dL — ABNORMAL HIGH (ref 6–20)
CO2: 32 mmol/L (ref 22–32)
Calcium: 9.2 mg/dL (ref 8.9–10.3)
Chloride: 87 mmol/L — ABNORMAL LOW (ref 98–111)
Creatinine, Ser: 1.2 mg/dL — ABNORMAL HIGH (ref 0.44–1.00)
GFR, Estimated: 56 mL/min — ABNORMAL LOW (ref >60)
Glucose, Bld: 274 mg/dL — ABNORMAL HIGH (ref 70–99)
Potassium: 3.8 mmol/L (ref 3.5–5.1)
Sodium: 131 mmol/L — ABNORMAL LOW (ref 135–145)

## 2022-05-16 LAB — GLUCOSE, CAPILLARY
Glucose-Capillary: 165 mg/dL — ABNORMAL HIGH (ref 70–99)
Glucose-Capillary: 246 mg/dL — ABNORMAL HIGH (ref 70–99)
Glucose-Capillary: 271 mg/dL — ABNORMAL HIGH (ref 70–99)
Glucose-Capillary: 215 mg/dL — ABNORMAL HIGH (ref 70–99)
Glucose-Capillary: 160 mg/dL — ABNORMAL HIGH (ref 70–99)
Glucose-Capillary: 316 mg/dL — ABNORMAL HIGH (ref 70–99)

## 2022-05-16 MED ORDER — POTASSIUM CHLORIDE CRYS ER 20 MEQ PO TBCR
40.0000 meq | EXTENDED_RELEASE_TABLET | Freq: Once | ORAL | Status: AC
  Administered 2022-05-16: 40 meq via ORAL
  Filled 2022-05-16: qty 2

## 2022-05-16 MED ORDER — LIDOCAINE 5 % EX PTCH
1.0000 | MEDICATED_PATCH | Freq: Once | CUTANEOUS | Status: AC
  Administered 2022-05-17: 1 via TRANSDERMAL
  Filled 2022-05-16: qty 1

## 2022-05-16 MED ORDER — POLYETHYLENE GLYCOL 3350 17 G PO PACK
17.0000 g | PACK | Freq: Once | ORAL | Status: AC
  Administered 2022-05-16: 17 g via ORAL
  Filled 2022-05-16: qty 1

## 2022-05-16 NOTE — Progress Notes (Signed)
ReDS Vest / Clip - 05/16/22 0900       ReDS Vest / Clip   Station Marker B    Ruler Value 42    ReDS Value Range Moderate volume overload    ReDS Actual Value 37

## 2022-05-16 NOTE — TOC Progression Note (Signed)
Transition of Care [facility]) - Progression Note    Patient Details  Name: Caitlyn Fuller MRN: [id number] Date of Birth: Oct 07, 1975  [facility] of [facility]) CM/SW Contact  Truddie Hidden, RN Phone Number: 05/16/2022, 3:37 PM  Clinical Narrative:    Case reviewed for DME needs and changes in discharge disposition.    Expected Discharge Plan: Home/Self Care Barriers to Discharge: Continued Medical Work up  Expected Discharge Plan and Services       Living arrangements for the past 2 months: Single Family Home                                       Social Determinants of Health (SDOH) Interventions SDOH Screenings   Food Insecurity: No Food Insecurity (05/02/2022)  Housing: Low Risk  (05/02/2022)  Transportation Needs: No Transportation Needs (05/02/2022)  Utilities: Not At Risk (05/02/2022)  Tobacco Use: Medium Risk (05/02/2022)    Readmission Risk Interventions    11/28/2021   11:17 AM 11/21/2021   11:34 AM  Readmission Risk Prevention Plan  Transportation Screening Complete Complete  PCP or Specialist Appt within 3-5 Days Complete Complete  Social Work Consult for Recovery Care Planning/Counseling Complete Complete  Palliative Care Screening  Not Applicable  Medication Review Oceanographer) Complete Complete

## 2022-05-16 NOTE — Progress Notes (Addendum)
Patient ID: Caitlyn Fuller, female   DOB: [birth year], 47 y.o.   MRN: [id number]     Advanced Heart Failure Rounding Note  PCP-Cardiologist: None   Subjective:    She diuresed better yesterday, weight down. I/Os net negative 2800 cc.  BUN higher at 78.    Still with significant peripheral edema and short of breath walking.    Objective:   Weight Range: 111.6 kg Body mass index is 48.05 kg/m.   Vital Signs:   Temp:  [97.7 F (36.5 C)-98.1 F (36.7 C)] 97.9 F (36.6 C) (05/07 0813) Pulse Rate:  [77-91] 77 (05/07 0813) Resp:  [18-20] 18 (05/07 0813) BP: (94-150)/(70-91) 94/70 (05/07 0813) SpO2:  [96 %-100 %] 96 % (05/07 0813) Weight:  [111.6 kg] 111.6 kg (05/07 0421) Last BM Date : 05/14/22  Weight change: Filed Weights   05/15/22 0410 05/15/22 0900 05/16/22 0421  Weight: 118 kg 118.2 kg 111.6 kg    Intake/Output:   Intake/Output Summary (Last 24 hours) at 05/16/2022 0929 Last data filed at 05/16/2022 0528 Gross per 24 hour  Intake 2000 ml  Output 4800 ml  Net -2800 ml      Physical Exam    General:  Well appearing. No resp difficulty HEENT: Normal Neck: Thick. JVP difficult. Carotids 2+ bilat; no bruits. No lymphadenopathy or thyromegaly appreciated. Cor: PMI nondisplaced. Regular rate & rhythm. No rubs, gallops or murmurs. Lungs: Clear Abdomen: Soft, nontender, nondistended. No hepatosplenomegaly. No bruits or masses. Good bowel sounds. Extremities: No cyanosis, clubbing, rash. 1+ edema to knees.  Neuro: Alert & orientedx3, cranial nerves grossly intact. moves all 4 extremities w/o difficulty. Affect pleasant   Telemetry   NSR (personally reviewed)   Labs    CBC No results for input(s): "WBC", "NEUTROABS", "HGB", "HCT", "MCV", "PLT" in the last 72 hours. Basic Metabolic Panel Recent Labs    84/69/62 0415 05/16/22 0534  NA 131* 131*  K 4.4 3.8  CL 89* 87*  CO2 34* 32  GLUCOSE 332* 274*  BUN 65* 78*  CREATININE 1.11* 1.20*  CALCIUM 8.9 9.2    Liver Function Tests No results for input(s): "AST", "ALT", "ALKPHOS", "BILITOT", "PROT", "ALBUMIN" in the last 72 hours. No results for input(s): "LIPASE", "AMYLASE" in the last 72 hours. Cardiac Enzymes No results for input(s): "CKTOTAL", "CKMB", "CKMBINDEX", "TROPONINI" in the last 72 hours.  BNP: BNP (last 3 results) Recent Labs    12/01/21 0532 05/01/22 1518 05/15/22 1131  BNP 26.8 142.2* 86.5    ProBNP (last 3 results) No results for input(s): "PROBNP" in the last 8760 hours.   D-Dimer No results for input(s): "DDIMER" in the last 72 hours. Hemoglobin A1C No results for input(s): "HGBA1C" in the last 72 hours. Fasting Lipid Panel No results for input(s): "CHOL", "HDL", "LDLCALC", "TRIG", "CHOLHDL", "LDLDIRECT" in the last 72 hours. Thyroid Function Tests No results for input(s): "TSH", "T4TOTAL", "T3FREE", "THYROIDAB" in the last 72 hours.  Invalid input(s): "FREET3"  Other results:   Imaging    No results found.   Medications:     Scheduled Medications:  acetaminophen  650 mg Oral Q8H   acetaZOLAMIDE  250 mg Oral BID   aspirin EC  81 mg Oral Daily   atorvastatin  10 mg Oral Daily   buprenorphine-naloxone  1 tablet Sublingual TID   vitamin B-12  1,000 mcg Oral Daily   dapagliflozin propanediol  10 mg Oral Daily   enoxaparin (LOVENOX) injection  0.5 mg/kg Subcutaneous Q24H   furosemide  80  mg Intravenous BID   insulin aspart  0-20 Units Subcutaneous TID AC & HS   insulin aspart  30 Units Subcutaneous TID WC   insulin glargine-yfgn  80 Units Subcutaneous BID   sodium chloride flush  3 mL Intravenous Q12H   spironolactone  25 mg Oral Daily   Vitamin D (Ergocalciferol)  50,000 Units Oral Q7 days    Infusions:  sodium chloride      PRN Medications: sodium chloride, acetaminophen, diclofenac Sodium, fluticasone, [DISCONTINUED] hydrALAZINE **OR** hydrALAZINE, hydrOXYzine, ondansetron (ZOFRAN) IV, oxyCODONE, sodium chloride  flush     Assessment/Plan   1. Acute on chronic diastolic CHF: Echo in 4/24 with EF 60-65%, mild LVH, normal RV.  She was admitted about 2 wks ago with weight gain and exertional dyspnea.  Diuresis has been difficult.  Exam is difficult for volume though I suspect that she remains volume overloaded, REDS reading was 38% yesterday.  BUN is high with creatinine 1.2.  I think she has significant RV failure, possibly with pulmonary hypertension.  She has very poor understanding of CHF/current health status.  She diuresed well yesterday with Lasix 80 mg IV bid and Diamox.  - She needs right heart catheterization at this point for better assessment of filling pressures and PA pressure.  I again discussed this with her but she again refuses.  - I will get a REDS reading again today => mildly elevated at 37%.  - Lasix 80 mg IV bid today with acetazolamide 250 mg bid.  Will aim for 1 more day of this regimen, then renal function will probably require [place] to back off the diuresis.  - Continue Farxiga 10 mg daily.  Suspect benefit will outweigh GU risk.  - Continue spironolactone 25 mg daily.  - Need to get accurate I/Os and standing weights.  - She needs close outpatient followup, will see if we can get her help with transportation (no car).  2. AKI: Primarily elevated BUN.  Suspect cardiorenal syndrome with significant RV failure.  - Would like to get 1 more day of aggressive diuresis.   3. Morbid obesity: Would suggest GLP-1 agonist as outpatient.  4. HTN: Continue spironolactone 25, stop irbesartan with rise in creatinine.  5. OSA: Strong suspicion for this.  Needs sleep study as outpatient.   Length of Stay: 96  Marca Ancona, MD  05/16/2022, 9:29 AM  Advanced Heart Failure Team Pager [phone] (M-F; 7a - 5p)  Please contact [facility] for night-coverage after hours (5p -7a ) and weekends on [email or web address]

## 2022-05-16 NOTE — Plan of Care (Signed)
  Problem: Skin Integrity: Goal: Risk for impaired skin integrity will decrease Outcome: Progressing   Problem: Elimination: Goal: Will not experience complications related to bowel motility Outcome: Progressing   Problem: Skin Integrity: Goal: Risk for impaired skin integrity will decrease Outcome: Progressing   Problem: Education: Goal: Ability to describe self-care measures that may prevent or decrease complications (Diabetes Survival Skills Education) will improve Outcome: Not Progressing Goal: Individualized Educational Video(s) Outcome: Not Progressing   Problem: Coping: Goal: Ability to adjust to condition or change in health will improve Outcome: Not Progressing   Problem: Fluid Volume: Goal: Ability to maintain a balanced intake and output will improve Outcome: Not Progressing   Problem: Health Behavior/Discharge Planning: Goal: Ability to identify and utilize available resources and services will improve Outcome: Not Progressing Goal: Ability to manage health-related needs will improve Outcome: Not Progressing   Problem: Metabolic: Goal: Ability to maintain appropriate glucose levels will improve Outcome: Not Progressing   Problem: Nutritional: Goal: Maintenance of adequate nutrition will improve Outcome: Not Progressing Goal: Progress toward achieving an optimal weight will improve Outcome: Not Progressing   Problem: Tissue Perfusion: Goal: Adequacy of tissue perfusion will improve Outcome: Not Progressing   Problem: Clinical Measurements: Goal: Diagnostic test results will improve Outcome: Not Progressing Goal: Cardiovascular complication will be avoided Outcome: Not Progressing   Problem: Pain Managment: Goal: General experience of comfort will improve Outcome: Not Progressing   Problem: Safety: Goal: Ability to remain free from injury will improve Outcome: Not Progressing

## 2022-05-16 NOTE — Progress Notes (Signed)
[facility] Progress Note  Patient: Caitlyn Fuller    QQV:[id number]  DOA: 05/01/2022     Date of Service: the patient was seen and examined on 05/16/2022  Chief Complaint  Patient presents with   Hyperglycemia   Brief hospital course: Caitlyn Fuller is a 47 y.o. female with medical history significant of HFpEF, insulin-dependent type 2 diabetes, hypertension, polysubstance abuse, chronic pain syndrome, morbid obesity, who presents to the ED with complaints of elevated blood sugar.  Patient is noncompliant with her medications  5/3: Vitals with elevated blood pressure at 157/83.  Renal function and alkalosis seems stable.  Most likely had pseudohyponatremia as CBG remained elevated.  No urinary output recorded but according to patient she is having good urinary output.  Nephrology is on board.  Volume status improving but still appears volume up.  Remained on Lasix infusion. Patient was on oxycodone and Suboxone both.  Became little irritable when asked why she is taking both, stating that she was taking Suboxone for other reasons.  She was requesting to increase the dose of oxycodone due to uncontrolled pain, stating that she was taking 15 mg at home and here she was getting only 5 mg.  Dose increased to 10 mg.  Need to discontinue Suboxone if she remained on oxycodone.  5/4: Vital stable.  Slight worsening of creatinine so Lasix infusion was discontinued.  Patient became very upset and rude when told that she might be going home soon.  Continue to have significant lower extremity edema.  Patient is being noncompliant with her fluid restrictions while in the hospital, apparently family members are bringing different drinks.  She was not saving most of her urine so no strict intake and output recorded.  She was very threatening with the potential discharge and rudely saying that she wanted a different physician and second opinion and asking me to get out of the room.  Charge nurse  notified.   Cardiology was also consulted-she is a known to the practice and never showed up for her follow-up appointments. Cardiology is recommending trying 1 dose of IV Lasix with metolazone with close monitoring of renal function for 1 day.  5/5: Hemodynamically stable.  No strict in and out recorded. Nephrology started her on torsemide and metolazone was also added.  Improving lower extremity edema but continued to have 2+.  She was having excessive daytime sleepiness -likely some element of sleep apnea due to her weight, she will need outpatient sleep study.  5/6: Remained hemodynamically stable.  Weight with some minor increase which does not make sense.  Heart failure team is not recommending right heart catheterization for measurements of her pressure but she refused.  They are going to try little higher doses of Lasix with Diamox to see her response.  Continue to have lower extremity edema.  5/7: Hemodynamically stable.  Refusing some care including insulin.  Slight increase in creatinine but cardiology would like to continue current dose of Lasix for another day.  Diuresing well  Assessment and Plan:  # Hyperglycemia, uncontrolled diabetes mellitus due to noncompliance IDDM T2 5/7: Patient refusing most of the insulin so diabetes coordinator does not want [place] to go up on dosing at this time 5/6: CBG again elevated, increasing mealtime coverage to 30 units. 5/5.  1 episode of hypoglycemia requiring p.o. juice, overall CBGs stable and improving.  Continuing current regimen with close monitoring. 5/3: Increase Semglee to 80 units twice daily and continue rest 4/28 increased  Semglee 75 units  twice daily 4/27 Increased NovoLog 25 units 3 times daily and sliding scale 4/26 NovoLog 10 units IV given due to high blood sugar Monitor CBG, continue diabetic diet Diabetic coordinator consulted for further management and counseling   # Anasarca, diastolic CHF patient has significant  bilateral lower extremity edema D-dimer negative, venous duplex negative for DVT US renal: The kidneys are unremarkable. A large postvoid residual of 108 cc is identified. The bladder is otherwise unremarkable. Monitor intake output and renal functions, continue fluid restriction 1.5 L/day-patient is noncompliant She was initially started on IV Lasix followed by Lasix infusion.  Did develop contraction alkalosis due to high rates and nephrology was consulted.  Rate was decreased.  Per patient she is peeing good but not providing all the urine for measurement.  Not following the directions for fluid restriction. Patient remained on Lasix infusion for many days and discontinued today due to mild hyponatremia and some worsening of creatinine. Cardiology was also consulted due to her threatening behavior. Apparently she was noncompliant and not following as outpatient.  Heart failure team is not recommending right heart cath but she refused. -Trial of high-dose of IV Lasix and Diamox -Continue with strict intake and output if she cooperates. -Daily weight and BMP -Spironolactone was also added  # Accelerated hypertension  Uncontrolled blood pressure, BP 197/66 on admission Continue irbesartan 75 mg p.o. daily home dose Use hydralazine p.o. or IV as needed as per parameters S/p Lasix 40 mg IV daily Monitor BP and titrate medications accordingly 4/24 --4/28 s/p Lopressor 50 mg p.o. BID, discontinued, patient was feeling dizziness after taking medication.   4/28 started Coreg 6.25 mg p.o. twice daily 4/29 blood pressures soft now, patient refused coreg last night, so discontinued 5/3: Blood pressure remained mildly elevated.  We will continue irbesartan and monitor  # Chronic pain Patient endorses chronic pain for which she states she was previously on Percocet.  PDMP reviewed and last prescription for oxycodone was in November 2023. - Conservative management and recommend outpatient follow-up  with PCP 4/28 patient stated no allergy to Tylenol, so started Tylenol 650 mg p.o. every 6 hourly as needed 4/29 started oxycodone 5 mg p.o. every 6 hours as needed 5/2: Oxycodone dose increased to 10 mg p.o. every 6 hourly as needed.  Suboxone needs to be discontinued, patient became very irritable stating that she is using Suboxone for other reasons.  # Polysubstance abuse - Continue home Suboxone  # Sore throat Patient states that she developed perioral erythema and lesions on her tongue after taking a new blood pressure medicine last night.  At this time, she continues to have a sore throat.  On exam, she has some mild erythema, but no edema. - Supportive management  # Obesity Body mass index is 46.89 kg/m.  Interventions: Calorie restricted diet and daily exercise advised to lose body weight.  Lifestyle modification discussed.   # Vitamin D deficiency: started vitamin D 50,000 units p.o. weekly, follow with PCP to repeat vitamin D level after 3 to 6 months. #Vitamin B12 level 205 at lower end, goal >400, started vitamin B12 1000 mcg IM injection daily for 7 days during hospital stay followed by oral supplement at discharge.  Follow with PCP to repeat B12 level after 3 to 6 months.     Diet: Diabetic diet, fluid restriction 1.5 L/day DVT Prophylaxis: Subcutaneous Lovenox   Advance goals of care discussion: Full code  Family Communication: Discussed with patient   Disposition:  Pt is from  Home, admitted with diabetes,  accelerated blood pressure, and anasarca, still has lower extremity edema, on IV Lasix infusion, which precludes a safe discharge. Discharge to home, when clinically stable.  Subjective: Patient was seen and examined today.  Saying that she does not feel well.  Unable to explain any symptoms.  Physical Exam: General.  Morbidly obese lady, in no acute distress. Pulmonary.  Lungs clear bilaterally, normal respiratory effort. CV.  Regular rate and rhythm, no JVD,  rub or murmur. Abdomen.  Soft, nontender, nondistended, BS positive. CNS.  Alert and oriented .  No focal neurologic deficit. Extremities.  2+ LE edema, no cyanosis, pulses intact and symmetrical. Psychiatry.  Judgment and insight appears normal.   Vitals:   05/15/22 2313 05/16/22 0346 05/16/22 0421 05/16/22 0813  BP: 128/77 114/71  94/70  Pulse: 80 77  77  Resp: 18 18  18   Temp: 98.1 F (36.7 C) 97.8 F (36.6 C)  97.9 F (36.6 C)  TempSrc: Oral Oral  Oral  SpO2: 98% 96%  96%  Weight:   111.6 kg     Intake/Output Summary (Last 24 hours) at 05/16/2022 1538 Last data filed at 05/16/2022 1433 Gross per 24 hour  Intake 500 ml  Output 5100 ml  Net -4600 ml    Filed Weights   05/15/22 0410 05/15/22 0900 05/16/22 0421  Weight: 118 kg 118.2 kg 111.6 kg    Data Reviewed: I have personally reviewed and interpreted daily labs, tele strips, imagings as discussed above. I reviewed all nursing notes, pharmacy notes, vitals, pertinent old records I have discussed plan of care as described above with RN and patient/family.  CBC: Recent Labs  Lab 05/10/22 0334 05/11/22 0711 05/12/22 0524  WBC 12.2* 11.9* 11.8*  HGB 11.8* 11.2* 12.0  HCT 38.7 35.7* 38.9  MCV 81.6 79.5* 81.0  PLT 260 256 281    Basic Metabolic Panel: Recent Labs  Lab 05/12/22 0524 05/13/22 0422 05/14/22 0516 05/15/22 0415 05/16/22 0534  NA 135 133* 136 131* 131*  K 4.2 4.2 4.1 4.4 3.8  CL 88* 90* 94* 89* 87*  CO2 37* 35* 30 34* 32  GLUCOSE 243* 271* 223* 332* 274*  BUN 64* 63* 61* 65* 78*  CREATININE 1.04* 1.19* 0.96 1.11* 1.20*  CALCIUM 9.3 9.1 8.9 8.9 9.2     Studies: No results found.  Scheduled Meds:  acetaminophen  650 mg Oral Q8H   acetaZOLAMIDE  250 mg Oral BID   aspirin EC  81 mg Oral Daily   atorvastatin  10 mg Oral Daily   buprenorphine-naloxone  1 tablet Sublingual TID   vitamin B-12  1,000 mcg Oral Daily   dapagliflozin propanediol  10 mg Oral Daily   enoxaparin (LOVENOX) injection   0.5 mg/kg Subcutaneous Q24H   furosemide  80 mg Intravenous BID   insulin aspart  0-20 Units Subcutaneous TID AC & HS   insulin aspart  30 Units Subcutaneous TID WC   insulin glargine-yfgn  80 Units Subcutaneous BID   sodium chloride flush  3 mL Intravenous Q12H   spironolactone  25 mg Oral Daily   Vitamin D (Ergocalciferol)  50,000 Units Oral Q7 days   Continuous Infusions:  sodium chloride     PRN Meds: sodium chloride, acetaminophen, diclofenac Sodium, fluticasone, [DISCONTINUED] hydrALAZINE **OR** hydrALAZINE, hydrOXYzine, ondansetron (ZOFRAN) IV, oxyCODONE, sodium chloride flush  Time spent: 40 minutes  Author: Arnetha Courser. MD [facility] 05/16/2022 3:38 PM  To reach On-call, see care teams to locate the attending  and reach out to them via [email or web address]. If 7PM-7AM, please contact night-coverage If you still have difficulty reaching the attending provider, please page the [facility] (Director on Call) for [facility] on amion for assistance.

## 2022-05-16 NOTE — Progress Notes (Signed)
[facility]  ROUNDING NOTE   Subjective:   Patient sitting up in bed No family at bedside Edema improved Complains of soreness  Creatinine 1.2   Objective:  Vital signs in last 24 hours:  Temp:  [97.7 F (36.5 C)-98.1 F (36.7 C)] 97.9 F (36.6 C) (05/07 0813) Pulse Rate:  [77-91] 77 (05/07 0813) Resp:  [18] 18 (05/07 0813) BP: (94-150)/(70-91) 94/70 (05/07 0813) SpO2:  [96 %-98 %] 96 % (05/07 0813) Weight:  [111.6 kg] 111.6 kg (05/07 0421)  Weight change: 1.315 kg Filed Weights   05/15/22 0410 05/15/22 0900 05/16/22 0421  Weight: 118 kg 118.2 kg 111.6 kg    Intake/Output: I/O last 3 completed shifts: In: 2243 [P.O.:2240; I.V.:3] Out: 5500 [Urine:5500]   Intake/Output this shift:  Total I/O In: -  Out: 1000 [Urine:1000]  Physical Exam: General: NAD  Head: Normocephalic, atraumatic. Moist oral mucosal membranes  Eyes: Anicteric  Lungs:  Clear to auscultation, normal effort  Heart: Regular rate and rhythm  Abdomen:  Soft, nontender  Extremities:  1+ peripheral edema  Neurologic: Alert and oriented, moving all four extremities  Skin: No lesions  Access: None    Basic Metabolic Panel: Recent Labs  Lab 05/12/22 0524 05/13/22 0422 05/14/22 0516 05/15/22 0415 05/16/22 0534  NA 135 133* 136 131* 131*  K 4.2 4.2 4.1 4.4 3.8  CL 88* 90* 94* 89* 87*  CO2 37* 35* 30 34* 32  GLUCOSE 243* 271* 223* 332* 274*  BUN 64* 63* 61* 65* 78*  CREATININE 1.04* 1.19* 0.96 1.11* 1.20*  CALCIUM 9.3 9.1 8.9 8.9 9.2     Liver Function Tests: No results for input(s): "AST", "ALT", "ALKPHOS", "BILITOT", "PROT", "ALBUMIN" in the last 168 hours. No results for input(s): "LIPASE", "AMYLASE" in the last 168 hours. No results for input(s): "AMMONIA" in the last 168 hours.  CBC: Recent Labs  Lab 05/10/22 0334 05/11/22 0711 05/12/22 0524  WBC 12.2* 11.9* 11.8*  HGB 11.8* 11.2* 12.0  HCT 38.7 35.7* 38.9  MCV 81.6 79.5* 81.0  PLT 260 256 281      Cardiac Enzymes: No results for input(s): "CKTOTAL", "CKMB", "CKMBINDEX", "TROPONINI" in the last 168 hours.  BNP: Invalid input(s): "POCBNP"  CBG: Recent Labs  Lab 05/15/22 2120 05/16/22 0309 05/16/22 0816 05/16/22 1119 05/16/22 1220  GLUCAP 223* 165* 246* 271* 215*     Microbiology: Results for orders placed or performed during the hospital encounter of 11/16/21  SARS Coronavirus 2 by RT PCR (hospital order, performed in [facility] hospital lab) *cepheid single result test* Anterior Nasal Swab     Status: None   Collection Time: 11/16/21  5:13 PM   Specimen: Anterior Nasal Swab  Result Value Ref Range Status   SARS Coronavirus 2 by RT PCR NEGATIVE NEGATIVE Final    Comment: (NOTE) SARS-CoV-2 target nucleic acids are NOT DETECTED.  The SARS-CoV-2 RNA is generally detectable in upper and lower respiratory specimens during the acute phase of infection. The lowest concentration of SARS-CoV-2 viral copies this assay can detect is 250 copies / mL. A negative result does not preclude SARS-CoV-2 infection and should not be used as the sole basis for treatment or other patient management decisions.  A negative result may occur with improper specimen collection / handling, submission of specimen other than nasopharyngeal swab, presence of viral mutation(s) within the areas targeted by this assay, and inadequate number of viral copies (<250 copies / mL). A negative result must be combined with clinical observations, patient  history, and epidemiological information.  Fact Sheet for Patients:   RoadLapTop.co.za  Fact Sheet for Healthcare Providers: http://kim-miller.com/  This test is not yet approved or  cleared by the [place] FDA and has been authorized for detection and/or diagnosis of SARS-CoV-2 by FDA under an Emergency Use Authorization (EUA).  This EUA will remain in effect (meaning this test can be used) for the  duration of the COVID-19 declaration under Section 564(b)(1) of the Act, 21 U.S.C. section 360bbb-3(b)(1), unless the authorization is terminated or revoked sooner.  Performed at [facility], [address] [place], [place] [address]     Coagulation Studies: No results for input(s): "LABPROT", "INR" in the last 72 hours.  Urinalysis: No results for input(s): "COLORURINE", "LABSPEC", "PHURINE", "GLUCOSEU", "HGBUR", "BILIRUBINUR", "KETONESUR", "PROTEINUR", "UROBILINOGEN", "NITRITE", "LEUKOCYTESUR" in the last 72 hours.  Invalid input(s): "APPERANCEUR"    Imaging: No results found.   Medications:    sodium chloride      acetaminophen  650 mg Oral Q8H   acetaZOLAMIDE  250 mg Oral BID   aspirin EC  81 mg Oral Daily   atorvastatin  10 mg Oral Daily   buprenorphine-naloxone  1 tablet Sublingual TID   vitamin B-12  1,000 mcg Oral Daily   dapagliflozin propanediol  10 mg Oral Daily   enoxaparin (LOVENOX) injection  0.5 mg/kg Subcutaneous Q24H   furosemide  80 mg Intravenous BID   insulin aspart  0-20 Units Subcutaneous TID AC & HS   insulin aspart  30 Units Subcutaneous TID WC   insulin glargine-yfgn  80 Units Subcutaneous BID   sodium chloride flush  3 mL Intravenous Q12H   spironolactone  25 mg Oral Daily   Vitamin D (Ergocalciferol)  50,000 Units Oral Q7 days   sodium chloride, acetaminophen, diclofenac Sodium, fluticasone, [DISCONTINUED] hydrALAZINE **OR** hydrALAZINE, hydrOXYzine, ondansetron (ZOFRAN) IV, oxyCODONE, sodium chloride flush  Assessment/ Plan:  Caitlyn Fuller is a 47 y.o.  female  with past medical conditions including uncontrolled type 2 diabetes, hypertension, obesity, heart failure, polysubstance abuse, who was admitted to [facility] on 05/01/2022 for Anasarca [R60.1] Hypertensive urgency [I16.0] Acute on chronic systolic CHF (congestive heart failure) (HCC) [I50.23] Hypervolemia, unspecified hypervolemia type [E87.70] Uncontrolled type 2  diabetes mellitus with hyperglycemia (HCC) [E11.65] Acute on chronic heart failure with preserved ejection fraction (HFpEF) (HCC) [I50.33]   Chronic kidney disease stage II likely secondary to uncontrolled hypertension and type 2 diabetes.  Baseline creatinine appears to be 0.88 with GFR 82 on 12/12/2021.  Renal ultrasound negative for obstruction or echogenicity.    Creatinine slightly elevated with IV furosemide.  2. Acute on chronic diastolic heart failure with volume overload.  Echo from 05/08/2022 shows EF 60 to 65% with grade 2 diastolic dysfunction.  Cardiology following.   Metolazone given during this admission. Was placed on Torsemide but returned to IV furosemide.    3.  Acute metabolic alkalosis, serum bicarb increased to 35.  Likely due to aggressive diuresis.has corrected to 32   4.  Hyponatremia/hypokalemia.  Oral supplementation by primary team yesterday.  Decreased sodium likely due to diuresis.  Sodium remains  131   5. Anemia of chronic kidney disease Lab Results  Component Value Date   HGB 12.0 05/12/2022    Hgb within desired range.   6. 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 Home regimen includes NovoLog and Levemir. Most recent hemoglobin A1c is 14.3 on 05/02/22.   Glucose remains elevated. Primary team to manage  SSI   7.  Hypertension with chronic kidney disease.  Home regimen includes irbesartan and furosemide.  Currently receiving these medications.    LOS: 14 Joelle Flessner 5/7/202412:58 PM

## 2022-05-16 NOTE — Progress Notes (Signed)
Patient refusing NPO after midnight and does not want to proceed with right heart cath tomorrow. States she is scared of procedure.

## 2022-05-16 NOTE — Progress Notes (Signed)
   Heart Failure Nurse Navigator Note  Met with patient today, she appears in no acute distress, on room air.  Again stressed the importance of daily weight, strict adherence to the fluid and sodium restriction.  Also to be compliant with medications.  She states that she does all this but still takes on fluid.  When questioned on eating fast food, she denies it, but daughters has told me different.  Will continue to follow.  Tresa Endo RN

## 2022-05-16 NOTE — Inpatient Diabetes Management (Signed)
Inpatient Diabetes Program Recommendations  AACE/ADA: New Consensus Statement on Inpatient Glycemic Control Target Ranges:  Prepandial:   less than 140 mg/dL      Peak postprandial:   less than 180 mg/dL (1-2 hours)      Critically ill patients:  140 - 180 mg/dL    Latest Reference Range & Units 05/16/22 03:09 05/16/22 08:16 05/16/22 11:19  Glucose-Capillary 70 - 99 mg/dL 161 (H) 096 (H)  Novolog 37 units 271 (H)    Latest Reference Range & Units 05/15/22 07:19 05/15/22 11:36 05/15/22 13:13 05/15/22 15:22 05/15/22 21:20  Glucose-Capillary 70 - 99 mg/dL 045 (H)  Novolog 11 units 129 (H)  Novolog 3 units 197 (H) 185 (H)  Novolog 4 units 223 (H)  Novolog 7 units   Review of Glycemic Control  Diabetes history: DM2 Outpatient Diabetes medications: Levemir 50 units QD, Novolog 0-20 TID and Novolog 20 units TID Current orders for Inpatient glycemic control: Semglee 80 units BID, Novolog 0-20 units AC&HS, Novolog 30 units TID with meals, Farxiga 10 mg daily  Inpatient Diabetes Program Recommendations:    Insulin: In reviewing chart, noted Semglee was NOT given at all on 05/15/22 (charted as patient refused both times) and refused today as well. Also refused meal coverage each time on 05/15/22 (just received Novolog correction insulin on 05/15/22).   Thanks, Orlando Penner, RN, MSN, CDCES Diabetes Coordinator Inpatient Diabetes Program [phone] (Team Pager from 8am to 5pm)

## 2022-05-17 DIAGNOSIS — E1165 Type 2 diabetes mellitus with hyperglycemia: Secondary | ICD-10-CM | POA: Diagnosis not present

## 2022-05-17 DIAGNOSIS — I5033 Acute on chronic diastolic (congestive) heart failure: Secondary | ICD-10-CM | POA: Diagnosis not present

## 2022-05-17 DIAGNOSIS — I5023 Acute on chronic systolic (congestive) heart failure: Secondary | ICD-10-CM | POA: Diagnosis not present

## 2022-05-17 DIAGNOSIS — I10 Essential (primary) hypertension: Secondary | ICD-10-CM | POA: Diagnosis not present

## 2022-05-17 LAB — BASIC METABOLIC PANEL
Anion gap: 14 (ref 5–15)
BUN: 87 mg/dL — ABNORMAL HIGH (ref 6–20)
CO2: 29 mmol/L (ref 22–32)
Calcium: 8.9 mg/dL (ref 8.9–10.3)
Chloride: 88 mmol/L — ABNORMAL LOW (ref 98–111)
Creatinine, Ser: 1.31 mg/dL — ABNORMAL HIGH (ref 0.44–1.00)
GFR, Estimated: 51 mL/min — ABNORMAL LOW (ref >60)
Glucose, Bld: 288 mg/dL — ABNORMAL HIGH (ref 70–99)
Potassium: 3.7 mmol/L (ref 3.5–5.1)
Sodium: 131 mmol/L — ABNORMAL LOW (ref 135–145)

## 2022-05-17 LAB — GLUCOSE, CAPILLARY
Glucose-Capillary: 260 mg/dL — ABNORMAL HIGH (ref 70–99)
Glucose-Capillary: 356 mg/dL — ABNORMAL HIGH (ref 70–99)
Glucose-Capillary: 142 mg/dL — ABNORMAL HIGH (ref 70–99)
Glucose-Capillary: 176 mg/dL — ABNORMAL HIGH (ref 70–99)
Glucose-Capillary: 195 mg/dL — ABNORMAL HIGH (ref 70–99)

## 2022-05-17 MED ORDER — ALPRAZOLAM 0.25 MG PO TABS
0.2500 mg | ORAL_TABLET | Freq: Two times a day (BID) | ORAL | Status: DC | PRN
  Administered 2022-05-17 – 2022-05-20 (×5): 0.25 mg via ORAL
  Filled 2022-05-17 (×5): qty 1

## 2022-05-17 MED ORDER — SENNOSIDES-DOCUSATE SODIUM 8.6-50 MG PO TABS: 1.0000 | ORAL_TABLET | Freq: Two times a day (BID) | ORAL | Status: DC

## 2022-05-17 MED ORDER — SODIUM CHLORIDE 0.9% FLUSH
3.0000 mL | Freq: Two times a day (BID) | INTRAVENOUS | Status: DC
  Administered 2022-05-18: 3 mL via INTRAVENOUS

## 2022-05-17 MED ORDER — TORSEMIDE 20 MG PO TABS
60.0000 mg | ORAL_TABLET | Freq: Every day | ORAL | Status: DC
  Administered 2022-05-17: 60 mg via ORAL
  Filled 2022-05-17: qty 3

## 2022-05-17 MED ORDER — SENNOSIDES-DOCUSATE SODIUM 8.6-50 MG PO TABS
2.0000 | ORAL_TABLET | Freq: Once | ORAL | Status: AC
  Administered 2022-05-18: 2 via ORAL
  Filled 2022-05-17: qty 2

## 2022-05-17 MED ORDER — POTASSIUM CHLORIDE CRYS ER 20 MEQ PO TBCR
40.0000 meq | EXTENDED_RELEASE_TABLET | Freq: Once | ORAL | Status: AC
  Administered 2022-05-17: 40 meq via ORAL
  Filled 2022-05-17: qty 2

## 2022-05-17 NOTE — Progress Notes (Signed)
[facility]  ROUNDING NOTE   Subjective:   Patient seen laying in bed, daughter at bedside Patient continues to complain of abdominal soreness and lower back pain Appetite remains appropriate, denies nausea or vomiting Lower extremity edema greatly improved since admission  Creatinine 1.31 Urine output 1 L recorded on day shift yesterday  Objective:  Vital signs in last 24 hours:  Temp:  [97.5 F (36.4 C)-98.1 F (36.7 C)] 98 F (36.7 C) (05/08 0738) Pulse Rate:  [81-87] 83 (05/08 1232) Resp:  [18-20] 18 (05/08 0738) BP: (92-159)/(61-98) 110/79 (05/08 1232) SpO2:  [94 %-100 %] 100 % (05/08 1232) Weight:  [116.3 kg] 116.3 kg (05/08 0336)  Weight change: -1.862 kg Filed Weights   05/15/22 0900 05/16/22 0421 05/17/22 0336  Weight: 118.2 kg 111.6 kg 116.3 kg    Intake/Output: I/O last 3 completed shifts: In: 720 [P.O.:720] Out: 4500 [Urine:4500]   Intake/Output this shift:  Total I/O In: 360 [P.O.:360] Out: -   Physical Exam: General: NAD  Head: Normocephalic, atraumatic. Moist oral mucosal membranes  Eyes: Anicteric  Lungs:  Clear to auscultation, normal effort  Heart: Regular rate and rhythm  Abdomen:  Soft, nontender  Extremities: Trace peripheral edema  Neurologic: Alert and oriented, moving all four extremities  Skin: No lesions  Access: None    Basic Metabolic Panel: Recent Labs  Lab 05/13/22 0422 05/14/22 0516 05/15/22 0415 05/16/22 0534 05/17/22 0436  NA 133* 136 131* 131* 131*  K 4.2 4.1 4.4 3.8 3.7  CL 90* 94* 89* 87* 88*  CO2 35* 30 34* 32 29  GLUCOSE 271* 223* 332* 274* 288*  BUN 63* 61* 65* 78* 87*  CREATININE 1.19* 0.96 1.11* 1.20* 1.31*  CALCIUM 9.1 8.9 8.9 9.2 8.9     Liver Function Tests: No results for input(s): "AST", "ALT", "ALKPHOS", "BILITOT", "PROT", "ALBUMIN" in the last 168 hours. No results for input(s): "LIPASE", "AMYLASE" in the last 168 hours. No results for input(s): "AMMONIA" in the last 168  hours.  CBC: Recent Labs  Lab 05/11/22 0711 05/12/22 0524  WBC 11.9* 11.8*  HGB 11.2* 12.0  HCT 35.7* 38.9  MCV 79.5* 81.0  PLT 256 281     Cardiac Enzymes: No results for input(s): "CKTOTAL", "CKMB", "CKMBINDEX", "TROPONINI" in the last 168 hours.  BNP: Invalid input(s): "POCBNP"  CBG: Recent Labs  Lab 05/16/22 1653 05/16/22 2037 05/17/22 0341 05/17/22 0736 05/17/22 1230  GLUCAP 160* 316* 260* 356* 142*     Microbiology: Results for orders placed or performed during the hospital encounter of 11/16/21  SARS Coronavirus 2 by RT PCR (hospital order, performed in [facility] hospital lab) *cepheid single result test* Anterior Nasal Swab     Status: None   Collection Time: 11/16/21  5:13 PM   Specimen: Anterior Nasal Swab  Result Value Ref Range Status   SARS Coronavirus 2 by RT PCR NEGATIVE NEGATIVE Final    Comment: (NOTE) SARS-CoV-2 target nucleic acids are NOT DETECTED.  The SARS-CoV-2 RNA is generally detectable in upper and lower respiratory specimens during the acute phase of infection. The lowest concentration of SARS-CoV-2 viral copies this assay can detect is 250 copies / mL. A negative result does not preclude SARS-CoV-2 infection and should not be used as the sole basis for treatment or other patient management decisions.  A negative result may occur with improper specimen collection / handling, submission of specimen other than nasopharyngeal swab, presence of viral mutation(s) within the areas targeted by this assay, and inadequate  number of viral copies (<250 copies / mL). A negative result must be combined with clinical observations, patient history, and epidemiological information.  Fact Sheet for Patients:   RoadLapTop.co.za  Fact Sheet for Healthcare Providers: http://kim-miller.com/  This test is not yet approved or  cleared by the [place] FDA and has been authorized for detection and/or  diagnosis of SARS-CoV-2 by FDA under an Emergency Use Authorization (EUA).  This EUA will remain in effect (meaning this test can be used) for the duration of the COVID-19 declaration under Section 564(b)(1) of the Act, 21 U.S.C. section 360bbb-3(b)(1), unless the authorization is terminated or revoked sooner.  Performed at [facility], [address] [place], [place] [address]     Coagulation Studies: No results for input(s): "LABPROT", "INR" in the last 72 hours.  Urinalysis: No results for input(s): "COLORURINE", "LABSPEC", "PHURINE", "GLUCOSEU", "HGBUR", "BILIRUBINUR", "KETONESUR", "PROTEINUR", "UROBILINOGEN", "NITRITE", "LEUKOCYTESUR" in the last 72 hours.  Invalid input(s): "APPERANCEUR"    Imaging: No results found.   Medications:    sodium chloride      acetaminophen  650 mg Oral Q8H   aspirin EC  81 mg Oral Daily   atorvastatin  10 mg Oral Daily   buprenorphine-naloxone  1 tablet Sublingual TID   vitamin B-12  1,000 mcg Oral Daily   dapagliflozin propanediol  10 mg Oral Daily   enoxaparin (LOVENOX) injection  0.5 mg/kg Subcutaneous Q24H   insulin aspart  0-20 Units Subcutaneous TID AC & HS   insulin aspart  30 Units Subcutaneous TID WC   insulin glargine-yfgn  80 Units Subcutaneous BID   lidocaine  1 patch Transdermal Once   sodium chloride flush  3 mL Intravenous Q12H   spironolactone  25 mg Oral Daily   torsemide  60 mg Oral Daily   Vitamin D (Ergocalciferol)  50,000 Units Oral Q7 days   sodium chloride, acetaminophen, diclofenac Sodium, fluticasone, [DISCONTINUED] hydrALAZINE **OR** hydrALAZINE, hydrOXYzine, ondansetron (ZOFRAN) IV, oxyCODONE, sodium chloride flush  Assessment/ Plan:  Ms. Caitlyn Fuller is a 47 y.o.  female  with past medical conditions including uncontrolled type 2 diabetes, hypertension, obesity, heart failure, polysubstance abuse, who was admitted to [facility] on 05/01/2022 for Anasarca [R60.1] Hypertensive urgency  [I16.0] Acute on chronic systolic CHF (congestive heart failure) (HCC) [I50.23] Hypervolemia, unspecified hypervolemia type [E87.70] Uncontrolled type 2 diabetes mellitus with hyperglycemia (HCC) [E11.65] Acute on chronic heart failure with preserved ejection fraction (HFpEF) (HCC) [I50.33]   Chronic kidney disease stage II likely secondary to uncontrolled hypertension and type 2 diabetes.  Baseline creatinine appears to be 0.88 with GFR 82 on 12/12/2021.  Renal ultrasound negative for obstruction or echogenicity.    Creatinine continues to slowly rise, patient transition to oral diuretics.  Currently prescribed torsemide 60 mg with spironolactone 25 mg daily.  Adequate urine output noted  2. Acute on chronic diastolic heart failure with volume overload.  Echo from 05/08/2022 shows EF 60 to 65% with grade 2 diastolic dysfunction.  Cardiology following.   Metolazone given during this admission.  Transition to oral torsemide 60 mg with spironolactone 25 mg yesterday.  Appears to be adequately diuresed at the moment   3.  Acute metabolic alkalosis, serum bicarb increased to 35.  Likely due to aggressive diuresis.serum bicarb 29   4.  Hyponatremia/hypokalemia.  Oral supplementation by primary team yesterday.  Decreased sodium likely due to diuresis.  Sodium appears stable at 131   5. Anemia of chronic kidney disease Lab Results  Component Value Date  HGB 12.0 05/12/2022    Hgb within desired range.   6. 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 Home regimen includes NovoLog and Levemir. Most recent hemoglobin A1c is 14.3 on 05/02/22.   Glucose control appears challenging.  Primary team to manage SSI   7.  Hypertension with chronic kidney disease.  Home regimen includes irbesartan and furosemide.  Now receiving torsemide and spironolactone.   LOS: 15 Wenceslao Loper 5/8/20241:50 PM

## 2022-05-17 NOTE — Plan of Care (Signed)
  Problem: Education: Goal: Ability to describe self-care measures that may prevent or decrease complications (Diabetes Survival Skills Education) will improve Outcome: Progressing Goal: Individualized Educational Video(s) Outcome: Progressing   Problem: Coping: Goal: Ability to adjust to condition or change in health will improve Outcome: Progressing   Problem: Fluid Volume: Goal: Ability to maintain a balanced intake and output will improve Outcome: Progressing   Problem: Health Behavior/Discharge Planning: Goal: Ability to identify and utilize available resources and services will improve Outcome: Progressing Goal: Ability to manage health-related needs will improve Outcome: Progressing   Problem: Metabolic: Goal: Ability to maintain appropriate glucose levels will improve Outcome: Progressing   Problem: Nutritional: Goal: Maintenance of adequate nutrition will improve Outcome: Progressing Goal: Progress toward achieving an optimal weight will improve Outcome: Progressing   Problem: Skin Integrity: Goal: Risk for impaired skin integrity will decrease Outcome: Progressing   Problem: Tissue Perfusion: Goal: Adequacy of tissue perfusion will improve Outcome: Progressing   Problem: Clinical Measurements: Goal: Diagnostic test results will improve Outcome: Progressing Goal: Cardiovascular complication will be avoided Outcome: Progressing   Problem: Elimination: Goal: Will not experience complications related to bowel motility Outcome: Progressing   Problem: Pain Managment: Goal: General experience of comfort will improve Outcome: Progressing   Problem: Safety: Goal: Ability to remain free from injury will improve Outcome: Progressing   Problem: Skin Integrity: Goal: Risk for impaired skin integrity will decrease Outcome: Progressing

## 2022-05-17 NOTE — Progress Notes (Signed)
ReDS Vest / Clip - 05/17/22 0900       ReDS Vest / Clip   Station Marker B    ReDS Value Range Moderate volume overload    ReDS Actual Value 38

## 2022-05-17 NOTE — Progress Notes (Signed)
Progress Note   Patient: Caitlyn Fuller JXB:[id number] DOB: [birth year] DOA: 05/01/2022     15 DOS: the patient was seen and examined on 05/17/2022   Brief hospital course: Assessment and Plan:  Caitlyn Fuller is a 47 y.o. female with medical history significant of HFpEF, insulin-dependent type 2 diabetes, hypertension, polysubstance abuse, chronic pain syndrome, morbid obesity, who presents to the ED with complaints of elevated blood sugar.  Patient is noncompliant with her medications   5/3: Vitals with elevated blood pressure at 157/83.  Renal function and alkalosis seems stable.  Most likely had pseudohyponatremia as CBG remained elevated.  No urinary output recorded but according to patient she is having good urinary output.  Nephrology is on board.  Volume status improving but still appears volume up.  Remained on Lasix infusion. Patient was on oxycodone and Suboxone both.  Became little irritable when asked why she is taking both, stating that she was taking Suboxone for other reasons.  She was requesting to increase the dose of oxycodone due to uncontrolled pain, stating that she was taking 15 mg at home and here she was getting only 5 mg.  Dose increased to 10 mg.  Need to discontinue Suboxone if she remained on oxycodone.   5/4: Vital stable.  Slight worsening of creatinine so Lasix infusion was discontinued.  Patient became very upset and rude when told that she might be going home soon.  Continue to have significant lower extremity edema.  Patient is being noncompliant with her fluid restrictions while in the hospital, apparently family members are bringing different drinks.  She was not saving most of her urine so no strict intake and output recorded.  She was very threatening with the potential discharge and rudely saying that she wanted a different physician and second opinion and asking me to get out of the room.  Charge nurse notified.   Cardiology was also consulted-she is a  known to the practice and never showed up for her follow-up appointments. Cardiology is recommending trying 1 dose of IV Lasix with metolazone with close monitoring of renal function for 1 day.   5/5: Hemodynamically stable.  No strict in and out recorded. Nephrology started her on torsemide and metolazone was also added.  Improving lower extremity edema but continued to have 2+.  She was having excessive daytime sleepiness -likely some element of sleep apnea due to her weight, she will need outpatient sleep study.   5/6: Remained hemodynamically stable.  Weight with some minor increase which does not make sense.  Heart failure team is not recommending right heart catheterization for measurements of her pressure but she refused.  They are going to try little higher doses of Lasix with Diamox to see her response.  Continue to have lower extremity edema.   5/7: Hemodynamically stable.  Refusing some care including insulin.  Slight increase in creatinine but cardiology would like to continue current dose of Lasix for another day.  Diuresing well  5/8: She refused right heart cath.  Wanting higher dose of oxycodone which I have refused as I am concerned about narcotic abuse   Assessment and Plan:   # Hyperglycemia, uncontrolled diabetes mellitus due to noncompliance IDDM T2 5/7: Patient refusing most of the insulin so diabetes coordinator does not want [place] to go up on dosing at this time 5/6: CBG again elevated, increasing mealtime coverage to 30 units. 5/5.  1 episode of hypoglycemia requiring p.o. juice, overall CBGs stable and improving.  Continuing current  regimen with close monitoring. 5/3: Increase Semglee to 80 units twice daily and continue rest 4/28 increased  Semglee 75 units twice daily 4/27 Increased NovoLog 25 units 3 times daily and sliding scale 4/26 NovoLog 10 units IV given due to high blood sugar Monitor CBG, continue diabetic diet Diabetic coordinator following   # Anasarca,  acute on chronic diastolic CHF patient has significant bilateral lower extremity edema D-dimer negative, venous duplex negative for DVT US renal: The kidneys are unremarkable. A large postvoid residual of 108 cc is identified. The bladder is otherwise unremarkable. Monitor intake output and renal functions, continue fluid restriction 1.5 L/day-patient is noncompliant She was initially started on IV Lasix followed by Lasix infusion.  Did develop contraction alkalosis due to high rates and nephrology was consulted.  Rate was decreased.  Per patient she is peeing good but not providing all the urine for measurement.  Not following the directions for fluid restriction. Patient remained on Lasix infusion for many days and discontinued today due to mild hyponatremia and some worsening of creatinine. Cardiology was also consulted due to her threatening behavior. Apparently she was noncompliant and not following as outpatient.  Heart failure team is not recommending right heart cath but she refused. -As the kidney functions are slowly trending up.  Transition her to oral torsemide 60 mg once daily per cardiology -Continue with strict intake and output if she cooperates. -Daily weight and BMP -Spironolactone 25 mg p.o. daily   # Accelerated hypertension  Uncontrolled blood pressure, BP 197/66 on admission Use hydralazine p.o. or IV as needed as per parameters Monitor BP and titrate medications accordingly 4/24 --4/28 s/p Lopressor 50 mg p.o. BID, discontinued, patient was feeling dizziness after taking medication.   4/28 started Coreg 6.25 mg p.o. twice daily 4/29 blood pressures soft now, patient refused coreg last night, so discontinued 5/3: Blood pressure remained mildly elevated.  We will continue irbesartan and monitor 5/8: Continue Aldactone, added torsemide   # Chronic pain Patient endorses chronic pain for which she states she was previously on Percocet.  PDMP reviewed and last prescription  for oxycodone was in November 2023 and 1 before was in April 2023.  Per pharmacist review - Conservative management and recommend outpatient follow-up with PCP 4/28 patient stated no allergy to Tylenol, so started Tylenol 650 mg p.o. every 6 hourly as needed 4/29 started oxycodone 5 mg p.o. every 6 hours as needed 5/2: Oxycodone dose increased to 10 mg p.o. every 6 hourly as needed.  5/8: Patient requested to go up on her oxycodone at 15 mg which I have refused as I am concerned about abuse.  She gets her Suboxone filled by 2 different providers per pharmacist review although they are on time and not too early.    # Polysubstance abuse - Continue home Suboxone   # Sore throat Patient states that she developed perioral erythema and lesions on her tongue after taking a new blood pressure medicine last night.  At this time, she continues to have a sore throat.  On exam, she has some mild erythema, but no edema. - Supportive management   # Obesity Body mass index is 46.89 kg/m.  Interventions: Calorie restricted diet and daily exercise advised to lose body weight.  Lifestyle modification discussed.   # Vitamin D deficiency: started vitamin D 50,000 units p.o. weekly, follow with PCP to repeat vitamin D level after 3 to 6 months. #Vitamin B12 level 205 at lower end, goal >400, started vitamin B12 1000  mcg IM injection daily for 7 days during hospital stay followed by oral supplement at discharge.  Follow with PCP to repeat B12 level after 3 to 6 months.   Acute on CKD stage II Acute worsening likely due to diuresis     Subjective: Patient is requesting higher dose of oxycodone for her back pain which I have declined.  She may change her mind for right heart cath according to her daughter who is at bedside  Physical Exam: Vitals:   05/16/22 2356 05/17/22 0336 05/17/22 0738 05/17/22 1232  BP: 117/61 120/68 137/73 110/79  Pulse: 84 85 81 83  Resp: 19 19 18    Temp: (!) 97.5 F (36.4 C)  98.1 F (36.7 C) 98 F (36.7 C)   TempSrc: Oral Oral    SpO2: 100% 100% 94% 100%  Weight:  116.3 kg     General.  Morbidly obese lady, in no acute distress. Pulmonary.  Lungs clear bilaterally, normal respiratory effort. CV.  Regular rate and rhythm, no JVD, rub or murmur. Abdomen.  Soft, nontender, nondistended, BS positive. CNS.  Alert and oriented .  No focal neurologic deficit. Extremities.  2+ LE edema, no cyanosis, pulses intact and symmetrical. Psychiatry.  Judgment and insight appears normal.  Data Reviewed:  Sodium 131, creatinine 1.31  Family Communication: Daughter updated at bedside  Disposition: Status is: Inpatient Remains inpatient appropriate because: Management of heart failure  Planned Discharge Destination: Home   DVT prophylaxis-Lovenox Time spent: 35 minutes  Author: Delfino Lovett, MD 05/17/2022 2:48 PM  For on call review [email or web address].

## 2022-05-17 NOTE — Progress Notes (Signed)
Patient ID: Caitlyn Fuller, female   DOB: 03-14-1975, 47 y.o.   MRN: [id number]     Advanced Heart Failure Rounding Note  PCP-Cardiologist: None   Subjective:    I/Os incomplete and suspect weights are not accurate (wild daily fluctuations).  Complains of back pain. Creatinine/BUN rising.    Objective:   Weight Range: 116.3 kg Body mass index is 50.07 kg/m.   Vital Signs:   Temp:  [97.5 F (36.4 C)-98.1 F (36.7 C)] 98 F (36.7 C) (05/08 0738) Pulse Rate:  [77-87] 81 (05/08 0738) Resp:  [18-20] 18 (05/08 0738) BP: (92-159)/(61-98) 137/73 (05/08 0738) SpO2:  [94 %-100 %] 94 % (05/08 0738) Weight:  [116.3 kg] 116.3 kg (05/08 0336) Last BM Date : 05/14/22  Weight change: Filed Weights   05/15/22 0900 05/16/22 0421 05/17/22 0336  Weight: 118.2 kg 111.6 kg 116.3 kg    Intake/Output:   Intake/Output Summary (Last 24 hours) at 05/17/2022 0805 Last data filed at 05/16/2022 1919 Gross per 24 hour  Intake 220 ml  Output 1000 ml  Net -780 ml      Physical Exam    General: NAD Neck: Thick, JVP difficult, no thyromegaly or thyroid nodule.  Lungs: Clear to auscultation bilaterally with normal respiratory effort. CV: Nondisplaced PMI.  Heart regular S1/S2, no S3/S4, no murmur.  1+ ankle edema, improved.  Abdomen: Soft, nontender, no hepatosplenomegaly, no distention.  Skin: Intact without lesions or rashes.  Neurologic: Alert and oriented x 3.  Psych: Normal affect. Extremities: No clubbing or cyanosis.  HEENT: Normal.    Telemetry   NSR (personally reviewed)   Labs    CBC No results for input(s): "WBC", "NEUTROABS", "HGB", "HCT", "MCV", "PLT" in the last 72 hours. Basic Metabolic Panel Recent Labs    40/98/11 0534 05/17/22 0436  NA 131* 131*  K 3.8 3.7  CL 87* 88*  CO2 32 29  GLUCOSE 274* 288*  BUN 78* 87*  CREATININE 1.20* 1.31*  CALCIUM 9.2 8.9   Liver Function Tests No results for input(s): "AST", "ALT", "ALKPHOS", "BILITOT", "PROT", "ALBUMIN"  in the last 72 hours. No results for input(s): "LIPASE", "AMYLASE" in the last 72 hours. Cardiac Enzymes No results for input(s): "CKTOTAL", "CKMB", "CKMBINDEX", "TROPONINI" in the last 72 hours.  BNP: BNP (last 3 results) Recent Labs    12/01/21 0532 05/01/22 1518 05/15/22 1131  BNP 26.8 142.2* 86.5    ProBNP (last 3 results) No results for input(s): "PROBNP" in the last 8760 hours.   D-Dimer No results for input(s): "DDIMER" in the last 72 hours. Hemoglobin A1C No results for input(s): "HGBA1C" in the last 72 hours. Fasting Lipid Panel No results for input(s): "CHOL", "HDL", "LDLCALC", "TRIG", "CHOLHDL", "LDLDIRECT" in the last 72 hours. Thyroid Function Tests No results for input(s): "TSH", "T4TOTAL", "T3FREE", "THYROIDAB" in the last 72 hours.  Invalid input(s): "FREET3"  Other results:   Imaging    No results found.   Medications:     Scheduled Medications:  acetaminophen  650 mg Oral Q8H   aspirin EC  81 mg Oral Daily   atorvastatin  10 mg Oral Daily   buprenorphine-naloxone  1 tablet Sublingual TID   vitamin B-12  1,000 mcg Oral Daily   dapagliflozin propanediol  10 mg Oral Daily   enoxaparin (LOVENOX) injection  0.5 mg/kg Subcutaneous Q24H   insulin aspart  0-20 Units Subcutaneous TID AC & HS   insulin aspart  30 Units Subcutaneous TID WC   insulin glargine-yfgn  80  Units Subcutaneous BID   lidocaine  1 patch Transdermal Once   potassium chloride  40 mEq Oral Once   sodium chloride flush  3 mL Intravenous Q12H   spironolactone  25 mg Oral Daily   torsemide  60 mg Oral Daily   Vitamin D (Ergocalciferol)  50,000 Units Oral Q7 days    Infusions:  sodium chloride      PRN Medications: sodium chloride, acetaminophen, diclofenac Sodium, fluticasone, [DISCONTINUED] hydrALAZINE **OR** hydrALAZINE, hydrOXYzine, ondansetron (ZOFRAN) IV, oxyCODONE, sodium chloride flush     Assessment/Plan   1. Acute on chronic diastolic CHF: Echo in 4/24 with EF  60-65%, mild LVH, normal RV.  She was admitted about 2 wks ago with weight gain and exertional dyspnea.  Diuresis has been difficult.  Exam is difficult for volume though I suspect that she remains volume overloaded, REDS reading was 37% yesterday.  BUN is high with creatinine 1.3.  I think she has significant RV failure, possibly with pulmonary hypertension.  She has very poor understanding of CHF/current health status.  I/Os incomplete though she says UOP was vigorous yesterday on Lasix 80 mg IV bid and Diamox.  - She needs right heart catheterization at this point for better assessment of filling pressures and PA pressure.  I again discussed this with her but she again refuses.  - I will get a REDS reading again today  - With rise in creatinine, will transition over to po diuretic today.  Start torsemide 60 mg daily for home.  - Continue Farxiga 10 mg daily.  Suspect benefit will outweigh GU risk.  - Continue spironolactone 25 mg daily.  - She needs close outpatient followup, will see if we can get her help with transportation (no car).  2. AKI: Primarily elevated BUN.  Suspect cardiorenal syndrome with significant RV failure. BUN/creatinine higher today.  - Transition to po torsemide, do not think she will tolerate further aggressive diuresis.  3. Morbid obesity: Would suggest GLP-1 agonist as outpatient.  4. HTN: Continue spironolactone 25, stopped irbesartan with rise in creatinine.  5. OSA: Strong suspicion for this.  Needs sleep study as outpatient.   Length of Stay: 15  Marca Ancona, MD  05/17/2022, 8:05 AM  Advanced Heart Failure Team Pager [phone] (M-F; 7a - 5p)  Please contact [facility] for night-coverage after hours (5p -7a ) and weekends on [email or web address]

## 2022-05-18 ENCOUNTER — Other Ambulatory Visit: Payer: Self-pay

## 2022-05-18 ENCOUNTER — Encounter
Admission: EM | Disposition: A | Discharge status: Home / Self Care | Payer: Self-pay | Source: Home / Self Care | Attending: Student

## 2022-05-18 DIAGNOSIS — I10 Essential (primary) hypertension: Secondary | ICD-10-CM | POA: Diagnosis not present

## 2022-05-18 DIAGNOSIS — I5033 Acute on chronic diastolic (congestive) heart failure: Secondary | ICD-10-CM | POA: Diagnosis not present

## 2022-05-18 DIAGNOSIS — I5023 Acute on chronic systolic (congestive) heart failure: Secondary | ICD-10-CM | POA: Diagnosis not present

## 2022-05-18 DIAGNOSIS — E1165 Type 2 diabetes mellitus with hyperglycemia: Secondary | ICD-10-CM | POA: Diagnosis not present

## 2022-05-18 HISTORY — PX: RIGHT HEART CATH: CATH118263

## 2022-05-18 LAB — BASIC METABOLIC PANEL
Anion gap: 12 (ref 5–15)
BUN: 93 mg/dL — ABNORMAL HIGH (ref 6–20)
CO2: 26 mmol/L (ref 22–32)
Calcium: 8.5 mg/dL — ABNORMAL LOW (ref 8.9–10.3)
Chloride: 93 mmol/L — ABNORMAL LOW (ref 98–111)
Creatinine, Ser: 1.38 mg/dL — ABNORMAL HIGH (ref 0.44–1.00)
GFR, Estimated: 48 mL/min — ABNORMAL LOW (ref >60)
Glucose, Bld: 246 mg/dL — ABNORMAL HIGH (ref 70–99)
Potassium: 3.8 mmol/L (ref 3.5–5.1)
Sodium: 131 mmol/L — ABNORMAL LOW (ref 135–145)

## 2022-05-18 LAB — CBC
HCT: 37.7 % (ref 36.0–46.0)
Hemoglobin: 11.8 g/dL — ABNORMAL LOW (ref 12.0–15.0)
MCH: 25.4 pg — ABNORMAL LOW (ref 26.0–34.0)
MCHC: 31.3 g/dL (ref 30.0–36.0)
MCV: 81.3 fL (ref 80.0–100.0)
Platelets: 311 10*3/uL (ref 150–400)
RBC: 4.64 MIL/uL (ref 3.87–5.11)
RDW: 14.6 % (ref 11.5–15.5)
WBC: 13.7 10*3/uL — ABNORMAL HIGH (ref 4.0–10.5)
nRBC: 0 % (ref 0.0–0.2)

## 2022-05-18 LAB — POCT I-STAT EG7
Acid-Base Excess: 0 mmol/L (ref 0.0–2.0)
Bicarbonate: 27.7 mmol/L (ref 20.0–28.0)
Calcium, Ion: 1.2 mmol/L (ref 1.15–1.40)
HCT: 36 % (ref 36.0–46.0)
Hemoglobin: 12.2 g/dL (ref 12.0–15.0)
O2 Saturation: 64 %
Potassium: 3.8 mmol/L (ref 3.5–5.1)
Sodium: 135 mmol/L (ref 135–145)
TCO2: 29 mmol/L (ref 22–32)
pCO2, Ven: 55.6 mmHg (ref 44–60)
pH, Ven: 7.306 (ref 7.25–7.43)
pO2, Ven: 37 mmHg (ref 32–45)

## 2022-05-18 LAB — GLUCOSE, CAPILLARY
Glucose-Capillary: 273 mg/dL — ABNORMAL HIGH (ref 70–99)
Glucose-Capillary: 210 mg/dL — ABNORMAL HIGH (ref 70–99)
Glucose-Capillary: 152 mg/dL — ABNORMAL HIGH (ref 70–99)
Glucose-Capillary: 138 mg/dL — ABNORMAL HIGH (ref 70–99)
Glucose-Capillary: 211 mg/dL — ABNORMAL HIGH (ref 70–99)

## 2022-05-18 LAB — MRSA NEXT GEN BY PCR, NASAL: MRSA by PCR Next Gen: NOT DETECTED

## 2022-05-18 SURGERY — RIGHT HEART CATH
Anesthesia: Moderate Sedation

## 2022-05-18 MED ORDER — FENTANYL CITRATE (PF) 100 MCG/2ML IJ SOLN
INTRAMUSCULAR | Status: AC
  Filled 2022-05-18: qty 2

## 2022-05-18 MED ORDER — SODIUM CHLORIDE 0.9 % IV SOLN
250.0000 mL | INTRAVENOUS | Status: DC | PRN
  Administered 2022-05-18: 250 mL via INTRAVENOUS

## 2022-05-18 MED ORDER — LABETALOL HCL 5 MG/ML IV SOLN: 10.0000 mg | INTRAVENOUS | Status: AC | PRN

## 2022-05-18 MED ORDER — SODIUM CHLORIDE 0.9% FLUSH: 10.0000 mL | INTRAVENOUS | Status: DC | PRN

## 2022-05-18 MED ORDER — SODIUM CHLORIDE 0.9 % IV SOLN: 250.0000 mL | INTRAVENOUS | Status: DC | PRN

## 2022-05-18 MED ORDER — CHLORHEXIDINE GLUCONATE CLOTH 2 % EX PADS
6.0000 | MEDICATED_PAD | Freq: Every day | CUTANEOUS | Status: DC
  Administered 2022-05-18 – 2022-05-20 (×3): 6 via TOPICAL

## 2022-05-18 MED ORDER — TORSEMIDE 20 MG PO TABS: 60.0000 mg | ORAL_TABLET | Freq: Every day | ORAL | Status: DC

## 2022-05-18 MED ORDER — MILRINONE LACTATE IN DEXTROSE 20-5 MG/100ML-% IV SOLN
0.1250 ug/kg/min | INTRAVENOUS | Status: DC
  Administered 2022-05-18: 0.125 ug/kg/min via INTRAVENOUS
  Filled 2022-05-18: qty 100

## 2022-05-18 MED ORDER — LIDOCAINE HCL (PF) 1 % IJ SOLN
INTRAMUSCULAR | Status: DC | PRN
  Administered 2022-05-18: 2 mL

## 2022-05-18 MED ORDER — SODIUM CHLORIDE 0.9% FLUSH
10.0000 mL | Freq: Two times a day (BID) | INTRAVENOUS | Status: DC
  Administered 2022-05-19 (×2): 10 mL

## 2022-05-18 MED ORDER — SENNOSIDES-DOCUSATE SODIUM 8.6-50 MG PO TABS
2.0000 | ORAL_TABLET | Freq: Two times a day (BID) | ORAL | Status: DC
  Administered 2022-05-18 – 2022-05-20 (×5): 2 via ORAL
  Filled 2022-05-18 (×5): qty 2

## 2022-05-18 MED ORDER — POLYETHYLENE GLYCOL 3350 17 G PO PACK
17.0000 g | PACK | Freq: Every day | ORAL | Status: DC
  Administered 2022-05-18 – 2022-05-20 (×3): 17 g via ORAL
  Filled 2022-05-18 (×3): qty 1

## 2022-05-18 MED ORDER — HYDRALAZINE HCL 20 MG/ML IJ SOLN: 10.0000 mg | INTRAMUSCULAR | Status: AC | PRN

## 2022-05-18 MED ORDER — FUROSEMIDE 10 MG/ML IJ SOLN
12.0000 mg/h | INTRAVENOUS | Status: DC
  Administered 2022-05-18: 12 mg/h via INTRAVENOUS
  Filled 2022-05-18: qty 20

## 2022-05-18 MED ORDER — FUROSEMIDE 10 MG/ML IJ SOLN
60.0000 mg | Freq: Once | INTRAMUSCULAR | Status: AC
  Administered 2022-05-18: 60 mg via INTRAVENOUS
  Filled 2022-05-18: qty 6

## 2022-05-18 MED ORDER — SODIUM CHLORIDE 0.9% FLUSH: 3.0000 mL | INTRAVENOUS | Status: DC | PRN

## 2022-05-18 MED ORDER — ACETAMINOPHEN 325 MG PO TABS: 650.0000 mg | ORAL_TABLET | ORAL | Status: DC | PRN

## 2022-05-18 MED ORDER — MIDAZOLAM HCL 2 MG/2ML IJ SOLN
INTRAMUSCULAR | Status: AC
  Filled 2022-05-18: qty 2

## 2022-05-18 MED ORDER — ALPRAZOLAM 0.5 MG PO TABS
ORAL_TABLET | ORAL | Status: AC
  Filled 2022-05-18: qty 1

## 2022-05-18 MED ORDER — SODIUM CHLORIDE 0.9% FLUSH
3.0000 mL | Freq: Two times a day (BID) | INTRAVENOUS | Status: DC
  Administered 2022-05-18: 3 mL via INTRAVENOUS

## 2022-05-18 MED ORDER — HEPARIN (PORCINE) IN NACL 1000-0.9 UT/500ML-% IV SOLN
INTRAVENOUS | Status: DC | PRN
  Administered 2022-05-18 (×2): 500 mL

## 2022-05-18 MED ORDER — SODIUM CHLORIDE 0.9 % IV SOLN: INTRAVENOUS | Status: DC

## 2022-05-18 MED ORDER — FENTANYL CITRATE (PF) 100 MCG/2ML IJ SOLN
INTRAMUSCULAR | Status: DC | PRN
  Administered 2022-05-18: 25 ug via INTRAVENOUS

## 2022-05-18 MED ORDER — ONDANSETRON HCL 4 MG/2ML IJ SOLN: 4.0000 mg | Freq: Four times a day (QID) | INTRAMUSCULAR | Status: DC | PRN

## 2022-05-18 MED ORDER — ENOXAPARIN SODIUM 60 MG/0.6ML IJ SOSY
60.0000 mg | PREFILLED_SYRINGE | INTRAMUSCULAR | Status: DC
  Filled 2022-05-18 (×2): qty 0.6

## 2022-05-18 SURGICAL SUPPLY — 6 items
CATH BALLN WEDGE 5F 110CM (CATHETERS) IMPLANT
DRAPE BRACHIAL (DRAPES) IMPLANT
GUIDEWIRE .025 260CM (WIRE) IMPLANT
PACK CARDIAC CATH (CUSTOM PROCEDURE TRAY) IMPLANT
SET ATX SIMPLICITY (MISCELLANEOUS) IMPLANT
SHEATH GLIDE SLENDER 4/5FR (SHEATH) IMPLANT

## 2022-05-18 NOTE — Progress Notes (Signed)
Pt getting PICC Line place now.

## 2022-05-18 NOTE — Progress Notes (Signed)
Patient ID: Caitlyn Fuller, female   DOB: 03/06/1975, 47 y.o.   MRN: [id number]     Advanced Heart Failure Rounding Note  PCP-Cardiologist: None   Subjective:    I/Os -1640, now on po torsemide.  BUN/creatinine mildly higher.  Complains of diffuse pain, no dyspnea in bed.    Objective:   Weight Range: 116.1 kg Body mass index is 49.98 kg/m.   Vital Signs:   Temp:  [97.7 F (36.5 C)-98.6 F (37 C)] 98.6 F (37 C) (05/09 0824) Pulse Rate:  [75-83] 78 (05/09 0824) Resp:  [16-20] 16 (05/09 0824) BP: (108-136)/(60-79) 116/71 (05/09 0824) SpO2:  [97 %-100 %] 97 % (05/09 0824) Weight:  [116.1 kg] 116.1 kg (05/09 0411) Last BM Date : 05/14/22  Weight change: Filed Weights   05/16/22 0421 05/17/22 0336 05/18/22 0411  Weight: 111.6 kg 116.3 kg 116.1 kg    Intake/Output:   Intake/Output Summary (Last 24 hours) at 05/18/2022 0840 Last data filed at 05/18/2022 0610 Gross per 24 hour  Intake 630 ml  Output 2275 ml  Net -1645 ml      Physical Exam    General: NAD Neck: Thick, JVP difficult, no thyromegaly or thyroid nodule.  Lungs: Clear to auscultation bilaterally with normal respiratory effort. CV: Nondisplaced PMI.  Heart regular S1/S2, no S3/S4, no murmur.  Trace ankle edema.   Abdomen: Soft, nontender, no hepatosplenomegaly, no distention.  Skin: Intact without lesions or rashes.  Neurologic: Alert and oriented x 3.  Psych: Normal affect. Extremities: No clubbing or cyanosis.  HEENT: Normal.    Telemetry   NSR (personally reviewed)   Labs    CBC Recent Labs    05/18/22 0342  WBC 13.7*  HGB 11.8*  HCT 37.7  MCV 81.3  PLT 311   Basic Metabolic Panel Recent Labs    05/17/22 0436 05/18/22 0342  NA 131* 131*  K 3.7 3.8  CL 88* 93*  CO2 29 26  GLUCOSE 288* 246*  BUN 87* 93*  CREATININE 1.31* 1.38*  CALCIUM 8.9 8.5*   Liver Function Tests No results for input(s): "AST", "ALT", "ALKPHOS", "BILITOT", "PROT", "ALBUMIN" in the last 72 hours. No  results for input(s): "LIPASE", "AMYLASE" in the last 72 hours. Cardiac Enzymes No results for input(s): "CKTOTAL", "CKMB", "CKMBINDEX", "TROPONINI" in the last 72 hours.  BNP: BNP (last 3 results) Recent Labs    12/01/21 0532 05/01/22 1518 05/15/22 1131  BNP 26.8 142.2* 86.5    ProBNP (last 3 results) No results for input(s): "PROBNP" in the last 8760 hours.   D-Dimer No results for input(s): "DDIMER" in the last 72 hours. Hemoglobin A1C No results for input(s): "HGBA1C" in the last 72 hours. Fasting Lipid Panel No results for input(s): "CHOL", "HDL", "LDLCALC", "TRIG", "CHOLHDL", "LDLDIRECT" in the last 72 hours. Thyroid Function Tests No results for input(s): "TSH", "T4TOTAL", "T3FREE", "THYROIDAB" in the last 72 hours.  Invalid input(s): "FREET3"  Other results:   Imaging    No results found.   Medications:     Scheduled Medications:  acetaminophen  650 mg Oral Q8H   aspirin EC  81 mg Oral Daily   atorvastatin  10 mg Oral Daily   buprenorphine-naloxone  1 tablet Sublingual TID   vitamin B-12  1,000 mcg Oral Daily   dapagliflozin propanediol  10 mg Oral Daily   enoxaparin (LOVENOX) injection  0.5 mg/kg Subcutaneous Q24H   insulin aspart  0-20 Units Subcutaneous TID AC & HS   insulin aspart  30   Units Subcutaneous TID WC   insulin glargine-yfgn  80 Units Subcutaneous BID   polyethylene glycol  17 g Oral Daily   senna-docusate  2 tablet Oral BID   sodium chloride flush  3 mL Intravenous Q12H   sodium chloride flush  3 mL Intravenous Q12H   spironolactone  25 mg Oral Daily   [START ON 05/19/2022] torsemide  60 mg Oral Daily   Vitamin D (Ergocalciferol)  50,000 Units Oral Q7 days    Infusions:  sodium chloride     [START ON 05/19/2022] sodium chloride      PRN Medications: sodium chloride, acetaminophen, ALPRAZolam, diclofenac Sodium, fluticasone, [DISCONTINUED] hydrALAZINE **OR** hydrALAZINE, hydrOXYzine, ondansetron (ZOFRAN) IV, oxyCODONE, sodium  chloride flush     Assessment/Plan   1. Acute on chronic diastolic CHF: Echo in 4/24 with EF 60-65%, mild LVH, normal RV.  She was admitted about 2 wks ago with weight gain and exertional dyspnea.  Diuresis has been difficult.  Exam is difficult for volume though I suspect that she remains volume overloaded, REDS reading was 37% yesterday.  BUN is high with creatinine 1.3.  I think she has significant RV failure, possibly with pulmonary hypertension.  She has very poor understanding of CHF/current health status.  She has been transitioned to po torsemide, BUN/cr up mildly today to 93/1.38.  - She needs right heart catheterization at this point for better assessment of filling pressures and PA pressure.  She is now agreeable to procedure.  We discussed risks/benefits of procedure and will plan for this afternoon.  She can have sips of clears early am.  - With rise again in BUN/creatinine, will hold torsemide today. Will plan to restart tomorrow at 60 mg daily.  - Continue Farxiga 10 mg daily.  Suspect benefit will outweigh GU risk.  - Continue spironolactone 25 mg daily.  - She needs close outpatient followup, will see if we can get her help with transportation (no car).  2. AKI: Primarily elevated BUN.  Suspect cardiorenal syndrome with significant RV failure. BUN/creatinine higher today.  - Hold po torsemide today, can restart tomorrow.  3. Morbid obesity: Would suggest GLP-1 agonist as outpatient.  4. HTN: Continue spironolactone 25, stopped irbesartan with rise in creatinine. BP stable.  5. OSA: Strong suspicion for this.  Needs sleep study as outpatient.   Length of Stay: 16  Naydeline Morace, MD  05/18/2022, 8:40 AM  Advanced Heart Failure Team Pager [phone] (M-F; 7a - 5p)  Please contact [facility] for night-coverage after hours (5p -7a ) and weekends on [email or web address]   

## 2022-05-18 NOTE — Progress Notes (Signed)
PIV site RAFA with large pustule present and redness around site.  PIV not in place at this time.  LAFA removed, noted possible slight infiltrate with bruising and redness present.  [facility]ulate RN aware of both sites prior to PICC placement.

## 2022-05-18 NOTE — Plan of Care (Signed)
  Problem: Fluid Volume: Goal: Ability to maintain a balanced intake and output will improve Outcome: Progressing   Problem: Education: Goal: Ability to describe self-care measures that may prevent or decrease complications (Diabetes Survival Skills Education) will improve Outcome: Not Progressing   Problem: Coping: Goal: Ability to adjust to condition or change in health will improve Outcome: Not Progressing   Problem: Health Behavior/Discharge Planning: Goal: Ability to safely manage health-related needs after discharge will improve Outcome: Not Progressing

## 2022-05-18 NOTE — Progress Notes (Signed)
Peripherally Inserted Central Catheter Placement  The IV Nurse has discussed with the patient and/or persons authorized to consent for the patient, the purpose of this procedure and the potential benefits and risks involved with this procedure.  The benefits include less needle sticks, lab draws from the catheter, and the patient may be discharged home with the catheter. Risks include, but not limited to, infection, bleeding, blood clot (thrombus formation), and puncture of an artery; nerve damage and irregular heartbeat and possibility to perform a PICC exchange if needed/ordered by physician.  Alternatives to this procedure were also discussed.  Bard Power PICC patient education guide, fact sheet on infection prevention and patient information card has been provided to patient /or left at bedside.  Daughter at bedside signed consent due to sedation post procedure.  PICC Placement Documentation  PICC Double Lumen 05/18/22 Left Basilic 46 cm 1 cm (Active)  Indication for Insertion or Continuance of Line Vasoactive infusions 05/18/22 1713  Exposed Catheter (cm) 1 cm 05/18/22 1713  Site Assessment Clean, Dry, Intact 05/18/22 1713  Lumen #1 Status Flushed;Saline locked;Blood return noted 05/18/22 1713  Lumen #2 Status Flushed;Saline locked;Blood return noted 05/18/22 1713  Dressing Type Transparent;Securing device 05/18/22 1713  Dressing Status Antimicrobial disc in place;Clean, Dry, Intact 05/18/22 1713  Safety Lock Not Applicable 05/18/22 1713  Line Care Connections checked and tightened 05/18/22 1713  Line Adjustment (NICU/IV Team Only) No 05/18/22 1713  Dressing Intervention New dressing 05/18/22 1713  Dressing Change Due 05/25/22 05/18/22 1713       Elliot Dally 05/18/2022, 5:13 PM

## 2022-05-18 NOTE — H&P (View-Only) (Signed)
Patient ID: Caitlyn Fuller, female   DOB: 1975-10-20, 47 y.o.   MRN: [id number]     Advanced Heart Failure Rounding Note  PCP-Cardiologist: None   Subjective:    I/Os -1640, now on po torsemide.  BUN/creatinine mildly higher.  Complains of diffuse pain, no dyspnea in bed.    Objective:   Weight Range: 116.1 kg Body mass index is 49.98 kg/m.   Vital Signs:   Temp:  [97.7 F (36.5 C)-98.6 F (37 C)] 98.6 F (37 C) (05/09 0824) Pulse Rate:  [75-83] 78 (05/09 0824) Resp:  [16-20] 16 (05/09 0824) BP: (108-136)/(60-79) 116/71 (05/09 0824) SpO2:  [97 %-100 %] 97 % (05/09 0824) Weight:  [116.1 kg] 116.1 kg (05/09 0411) Last BM Date : 05/14/22  Weight change: Filed Weights   05/16/22 0421 05/17/22 0336 05/18/22 0411  Weight: 111.6 kg 116.3 kg 116.1 kg    Intake/Output:   Intake/Output Summary (Last 24 hours) at 05/18/2022 0840 Last data filed at 05/18/2022 0610 Gross per 24 hour  Intake 630 ml  Output 2275 ml  Net -1645 ml      Physical Exam    General: NAD Neck: Thick, JVP difficult, no thyromegaly or thyroid nodule.  Lungs: Clear to auscultation bilaterally with normal respiratory effort. CV: Nondisplaced PMI.  Heart regular S1/S2, no S3/S4, no murmur.  Trace ankle edema.   Abdomen: Soft, nontender, no hepatosplenomegaly, no distention.  Skin: Intact without lesions or rashes.  Neurologic: Alert and oriented x 3.  Psych: Normal affect. Extremities: No clubbing or cyanosis.  HEENT: Normal.    Telemetry   NSR (personally reviewed)   Labs    CBC Recent Labs    05/18/22 0342  WBC 13.7*  HGB 11.8*  HCT 37.7  MCV 81.3  PLT 311   Basic Metabolic Panel Recent Labs    30/86/57 0436 05/18/22 0342  NA 131* 131*  K 3.7 3.8  CL 88* 93*  CO2 29 26  GLUCOSE 288* 246*  BUN 87* 93*  CREATININE 1.31* 1.38*  CALCIUM 8.9 8.5*   Liver Function Tests No results for input(s): "AST", "ALT", "ALKPHOS", "BILITOT", "PROT", "ALBUMIN" in the last 72 hours. No  results for input(s): "LIPASE", "AMYLASE" in the last 72 hours. Cardiac Enzymes No results for input(s): "CKTOTAL", "CKMB", "CKMBINDEX", "TROPONINI" in the last 72 hours.  BNP: BNP (last 3 results) Recent Labs    12/01/21 0532 05/01/22 1518 05/15/22 1131  BNP 26.8 142.2* 86.5    ProBNP (last 3 results) No results for input(s): "PROBNP" in the last 8760 hours.   D-Dimer No results for input(s): "DDIMER" in the last 72 hours. Hemoglobin A1C No results for input(s): "HGBA1C" in the last 72 hours. Fasting Lipid Panel No results for input(s): "CHOL", "HDL", "LDLCALC", "TRIG", "CHOLHDL", "LDLDIRECT" in the last 72 hours. Thyroid Function Tests No results for input(s): "TSH", "T4TOTAL", "T3FREE", "THYROIDAB" in the last 72 hours.  Invalid input(s): "FREET3"  Other results:   Imaging    No results found.   Medications:     Scheduled Medications:  acetaminophen  650 mg Oral Q8H   aspirin EC  81 mg Oral Daily   atorvastatin  10 mg Oral Daily   buprenorphine-naloxone  1 tablet Sublingual TID   vitamin B-12  1,000 mcg Oral Daily   dapagliflozin propanediol  10 mg Oral Daily   enoxaparin (LOVENOX) injection  0.5 mg/kg Subcutaneous Q24H   insulin aspart  0-20 Units Subcutaneous TID AC & HS   insulin aspart  30  Units Subcutaneous TID WC   insulin glargine-yfgn  80 Units Subcutaneous BID   polyethylene glycol  17 g Oral Daily   senna-docusate  2 tablet Oral BID   sodium chloride flush  3 mL Intravenous Q12H   sodium chloride flush  3 mL Intravenous Q12H   spironolactone  25 mg Oral Daily   [START ON 05/19/2022] torsemide  60 mg Oral Daily   Vitamin D (Ergocalciferol)  50,000 Units Oral Q7 days    Infusions:  sodium chloride     [START ON 05/19/2022] sodium chloride      PRN Medications: sodium chloride, acetaminophen, ALPRAZolam, diclofenac Sodium, fluticasone, [DISCONTINUED] hydrALAZINE **OR** hydrALAZINE, hydrOXYzine, ondansetron (ZOFRAN) IV, oxyCODONE, sodium  chloride flush     Assessment/Plan   1. Acute on chronic diastolic CHF: Echo in 4/24 with EF 60-65%, mild LVH, normal RV.  She was admitted about 2 wks ago with weight gain and exertional dyspnea.  Diuresis has been difficult.  Exam is difficult for volume though I suspect that she remains volume overloaded, REDS reading was 37% yesterday.  BUN is high with creatinine 1.3.  I think she has significant RV failure, possibly with pulmonary hypertension.  She has very poor understanding of CHF/current health status.  She has been transitioned to po torsemide, BUN/cr up mildly today to 93/1.38.  - She needs right heart catheterization at this point for better assessment of filling pressures and PA pressure.  She is now agreeable to procedure.  We discussed risks/benefits of procedure and will plan for this afternoon.  She can have sips of clears early am.  - With rise again in BUN/creatinine, will hold torsemide today. Will plan to restart tomorrow at 60 mg daily.  - Continue Farxiga 10 mg daily.  Suspect benefit will outweigh GU risk.  - Continue spironolactone 25 mg daily.  - She needs close outpatient followup, will see if we can get her help with transportation (no car).  2. AKI: Primarily elevated BUN.  Suspect cardiorenal syndrome with significant RV failure. BUN/creatinine higher today.  - Hold po torsemide today, can restart tomorrow.  3. Morbid obesity: Would suggest GLP-1 agonist as outpatient.  4. HTN: Continue spironolactone 25, stopped irbesartan with rise in creatinine. BP stable.  5. OSA: Strong suspicion for this.  Needs sleep study as outpatient.   Length of Stay: 40  Marca Ancona, MD  05/18/2022, 8:40 AM  Advanced Heart Failure Team Pager [phone] (M-F; 7a - 5p)  Please contact [facility] for night-coverage after hours (5p -7a ) and weekends on [email or web address]

## 2022-05-18 NOTE — Plan of Care (Signed)
  Problem: Education: Goal: Ability to describe self-care measures that may prevent or decrease complications (Diabetes Survival Skills Education) will improve Outcome: Not Progressing Goal: Individualized Educational Video(s) Outcome: Not Progressing   Problem: Coping: Goal: Ability to adjust to condition or change in health will improve Outcome: Not Progressing   Problem: Fluid Volume: Goal: Ability to maintain a balanced intake and output will improve Outcome: Not Progressing   Problem: Health Behavior/Discharge Planning: Goal: Ability to identify and utilize available resources and services will improve Outcome: Not Progressing Goal: Ability to manage health-related needs will improve Outcome: Not Progressing   Problem: Metabolic: Goal: Ability to maintain appropriate glucose levels will improve Outcome: Not Progressing   Problem: Nutritional: Goal: Maintenance of adequate nutrition will improve Outcome: Not Progressing Goal: Progress toward achieving an optimal weight will improve Outcome: Not Progressing   Problem: Skin Integrity: Goal: Risk for impaired skin integrity will decrease Outcome: Not Progressing   Problem: Tissue Perfusion: Goal: Adequacy of tissue perfusion will improve Outcome: Not Progressing   Problem: Clinical Measurements: Goal: Diagnostic test results will improve Outcome: Not Progressing Goal: Cardiovascular complication will be avoided Outcome: Not Progressing   Problem: Elimination: Goal: Will not experience complications related to bowel motility Outcome: Not Progressing   Problem: Pain Managment: Goal: General experience of comfort will improve Outcome: Not Progressing   Problem: Safety: Goal: Ability to remain free from injury will improve Outcome: Not Progressing   Problem: Skin Integrity: Goal: Risk for impaired skin integrity will decrease Outcome: Not Progressing   Problem: Education: Goal: Understanding of CV disease, CV  risk reduction, and recovery process will improve Outcome: Not Progressing Goal: Individualized Educational Video(s) Outcome: Not Progressing   Problem: Activity: Goal: Ability to return to baseline activity level will improve Outcome: Not Progressing   Problem: Cardiovascular: Goal: Ability to achieve and maintain adequate cardiovascular perfusion will improve Outcome: Not Progressing Goal: Vascular access site(s) Level 0-1 will be maintained Outcome: Not Progressing   Problem: Health Behavior/Discharge Planning: Goal: Ability to safely manage health-related needs after discharge will improve Outcome: Not Progressing

## 2022-05-18 NOTE — Progress Notes (Signed)
Received pt from cath lab, S/P RIGHT HEART CATH (N/A) as a surgical intervention.  Pt have right upper arm transparent dressing in place. Pt is A/O x 4, pt ambulate to the bathroom to void . Denies any need at this time oriented to room and call light. Pt will start on Lasix and milrinone gtt after PICC line is placed. Family called back and updated.

## 2022-05-18 NOTE — Progress Notes (Signed)
[facility]  ROUNDING NOTE   Subjective:   Patient seen resting quietly, no family at bedside Patient will little interaction this morning Currently n.p.o. for scheduled right heart cath  Creatinine 1.38 Urine output 2275 mL recorded   Objective:  Vital signs in last 24 hours:  Temp:  [97.7 F (36.5 C)-98.6 F (37 C)] 98 F (36.7 C) (05/09 1329) Pulse Rate:  [72-83] 77 (05/09 1545) Resp:  [8-20] 11 (05/09 1545) BP: (106-140)/(51-85) 133/85 (05/09 1545) SpO2:  [95 %-99 %] 98 % (05/09 1545) Weight:  [116.1 kg] 116.1 kg (05/09 0411)  Weight change: -0.225 kg Filed Weights   05/16/22 0421 05/17/22 0336 05/18/22 0411  Weight: 111.6 kg 116.3 kg 116.1 kg    Intake/Output: I/O last 3 completed shifts: In: 850 [P.O.:850] Out: 2275 [Urine:2275]   Intake/Output this shift:  No intake/output data recorded.  Physical Exam: General: NAD  Head: Normocephalic, atraumatic. Moist oral mucosal membranes  Eyes: Anicteric  Lungs:  Clear to auscultation, normal effort  Heart: Regular rate and rhythm  Abdomen:  Soft, nontender  Extremities: Trace peripheral edema  Neurologic: Alert and oriented, moving all four extremities  Skin: No lesions  Access: None    Basic Metabolic Panel: Recent Labs  Lab 05/14/22 0516 05/15/22 0415 05/16/22 0534 05/17/22 0436 05/18/22 0342 05/18/22 1449  NA 136 131* 131* 131* 131* 135  K 4.1 4.4 3.8 3.7 3.8 3.8  CL 94* 89* 87* 88* 93*  --   CO2 30 34* 32 29 26  --   GLUCOSE 223* 332* 274* 288* 246*  --   BUN 61* 65* 78* 87* 93*  --   CREATININE 0.96 1.11* 1.20* 1.31* 1.38*  --   CALCIUM 8.9 8.9 9.2 8.9 8.5*  --      Liver Function Tests: No results for input(s): "AST", "ALT", "ALKPHOS", "BILITOT", "PROT", "ALBUMIN" in the last 168 hours. No results for input(s): "LIPASE", "AMYLASE" in the last 168 hours. No results for input(s): "AMMONIA" in the last 168 hours.  CBC: Recent Labs  Lab 05/12/22 0524 05/18/22 0342  05/18/22 1449  WBC 11.8* 13.7*  --   HGB 12.0 11.8* 12.2  HCT 38.9 37.7 36.0  MCV 81.0 81.3  --   PLT 281 311  --      Cardiac Enzymes: No results for input(s): "CKTOTAL", "CKMB", "CKMBINDEX", "TROPONINI" in the last 168 hours.  BNP: Invalid input(s): "POCBNP"  CBG: Recent Labs  Lab 05/17/22 2033 05/18/22 0832 05/18/22 1131 05/18/22 1340 05/18/22 1607  GLUCAP 195* 273* 210* 152* 138*     Microbiology: Results for orders placed or performed during the hospital encounter of 11/16/21  SARS Coronavirus 2 by RT PCR (hospital order, performed in [facility] hospital lab) *cepheid single result test* Anterior Nasal Swab     Status: None   Collection Time: 11/16/21  5:13 PM   Specimen: Anterior Nasal Swab  Result Value Ref Range Status   SARS Coronavirus 2 by RT PCR NEGATIVE NEGATIVE Final    Comment: (NOTE) SARS-CoV-2 target nucleic acids are NOT DETECTED.  The SARS-CoV-2 RNA is generally detectable in upper and lower respiratory specimens during the acute phase of infection. The lowest concentration of SARS-CoV-2 viral copies this assay can detect is 250 copies / mL. A negative result does not preclude SARS-CoV-2 infection and should not be used as the sole basis for treatment or other patient management decisions.  A negative result may occur with improper specimen collection / handling, submission of specimen other  than nasopharyngeal swab, presence of viral mutation(s) within the areas targeted by this assay, and inadequate number of viral copies (<250 copies / mL). A negative result must be combined with clinical observations, patient history, and epidemiological information.  Fact Sheet for Patients:   RoadLapTop.co.za  Fact Sheet for Healthcare Providers: http://kim-miller.com/  This test is not yet approved or  cleared by the [place] FDA and has been authorized for detection and/or diagnosis of SARS-CoV-2  by FDA under an Emergency Use Authorization (EUA).  This EUA will remain in effect (meaning this test can be used) for the duration of the COVID-19 declaration under Section 564(b)(1) of the Act, 21 U.S.C. section 360bbb-3(b)(1), unless the authorization is terminated or revoked sooner.  Performed at [facility], [address] [place], [place] [address]     Coagulation Studies: No results for input(s): "LABPROT", "INR" in the last 72 hours.  Urinalysis: No results for input(s): "COLORURINE", "LABSPEC", "PHURINE", "GLUCOSEU", "HGBUR", "BILIRUBINUR", "KETONESUR", "PROTEINUR", "UROBILINOGEN", "NITRITE", "LEUKOCYTESUR" in the last 72 hours.  Invalid input(s): "APPERANCEUR"    Imaging: CARDIAC CATHETERIZATION  Result Date: 05/18/2022 1. Elevated RA pressure > PCWP.  2. Severe RV failure with low PAPi 0.9 3. Mild pulmonary venous hypertension. 4. CI 2.3 Difficult situation, creatinine rising but still with significantly elevated right-sided filling pressures.  I will place PICC line.  Will start milrinone 0.125 for RV support.  Lasix 60 mg IV x 1 then gtt at 12 mg/hr. Follow CVP off PICC, exam very difficult.  Step-down bed.   [place] EKG SITE RITE  Result Date: 05/18/2022 If Site Rite image not attached, placement could not be confirmed due to current cardiac rhythm.    Medications:    sodium chloride     sodium chloride     furosemide (LASIX) 200 mg in dextrose 5 % 100 mL (2 mg/mL) infusion     milrinone      acetaminophen  650 mg Oral Q8H   aspirin EC  81 mg Oral Daily   atorvastatin  10 mg Oral Daily   buprenorphine-naloxone  1 tablet Sublingual TID   Chlorhexidine Gluconate Cloth  6 each Topical Daily   vitamin B-12  1,000 mcg Oral Daily   dapagliflozin propanediol  10 mg Oral Daily   [START ON 05/19/2022] enoxaparin (LOVENOX) injection  60 mg Subcutaneous Q24H   furosemide  60 mg Intravenous Once   insulin aspart  0-20 Units Subcutaneous TID AC & HS   insulin  aspart  30 Units Subcutaneous TID WC   insulin glargine-yfgn  80 Units Subcutaneous BID   polyethylene glycol  17 g Oral Daily   senna-docusate  2 tablet Oral BID   sodium chloride flush  3 mL Intravenous Q12H   sodium chloride flush  3 mL Intravenous Q12H   sodium chloride flush  3 mL Intravenous Q12H   Vitamin D (Ergocalciferol)  50,000 Units Oral Q7 days   sodium chloride, sodium chloride, acetaminophen, acetaminophen, ALPRAZolam, diclofenac Sodium, fluticasone, hydrALAZINE, [DISCONTINUED] hydrALAZINE **OR** hydrALAZINE, hydrOXYzine, labetalol, ondansetron (ZOFRAN) IV, ondansetron (ZOFRAN) IV, oxyCODONE, sodium chloride flush, sodium chloride flush  Assessment/ Plan:  Caitlyn Fuller is a 47 y.o.  female  with past medical conditions including uncontrolled type 2 diabetes, hypertension, obesity, heart failure, polysubstance abuse, who was admitted to [facility] on 05/01/2022 for Anasarca [R60.1] Hypertensive urgency [I16.0] Acute on chronic systolic CHF (congestive heart failure) (HCC) [I50.23] Hypervolemia, unspecified hypervolemia type [E87.70] Uncontrolled type 2 diabetes mellitus with hyperglycemia (HCC) [E11.65] Acute  on chronic heart failure with preserved ejection fraction (HFpEF) (HCC) [I50.33]   Chronic kidney disease stage II likely secondary to uncontrolled hypertension and type 2 diabetes.  Baseline creatinine appears to be 0.88 with GFR 82 on 12/12/2021.  Renal ultrasound negative for obstruction or echogenicity.    Creatinine continues to slowly increase despite transition to oral diuretics.  Will continue to monitor.  2. Acute on chronic diastolic heart failure with volume overload.  Echo from 05/08/2022 shows EF 60 to 65% with grade 2 diastolic dysfunction.  Cardiology following.   Metolazone given during this admission.  Patient receiving torsemide 60 mg and spironolactone 25 mg.  Scheduled for right heart cath with cardiology later today.   3.  Acute metabolic alkalosis,  serum bicarb increased to 35.  Likely due to aggressive diuresis.  Serum bicarb corrected to 26.   4.  Hyponatremia/hypokalemia.  Oral supplementation by primary team yesterday.  Decreased sodium likely due to diuresis.  Sodium remains 131   5. Anemia of chronic kidney disease Lab Results  Component Value Date   HGB 12.2 05/18/2022    Hemoglobin within optimal range  6. 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 Home regimen includes NovoLog and Levemir. Most recent hemoglobin A1c is 14.3 on 05/02/22.    Primary team to manage SSI   7.  Hypertension with chronic kidney disease.  Home regimen includes irbesartan and furosemide.  Now receiving torsemide and spironolactone.   LOS: 16 Zamari Bonsall 5/9/20244:47 PM

## 2022-05-18 NOTE — Progress Notes (Signed)
Progress Note   Patient: Caitlyn Fuller:[id number] DOB: 05-24-1975 DOA: 05/01/2022     16 DOS: the patient was seen and examined on 05/18/2022   Brief hospital course: Assessment and Plan:  Caitlyn Fuller is a 47 y.o. female with medical history significant of HFpEF, insulin-dependent type 2 diabetes, hypertension, polysubstance abuse, chronic pain syndrome, morbid obesity, who presents to the ED with complaints of elevated blood sugar.  Patient is noncompliant with her medications   5/3: Vitals with elevated blood pressure at 157/83.  Renal function and alkalosis seems stable.  Most likely had pseudohyponatremia as CBG remained elevated.  No urinary output recorded but according to patient she is having good urinary output.  Nephrology is on board.  Volume status improving but still appears volume up.  Remained on Lasix infusion. Patient was on oxycodone and Suboxone both.  Became little irritable when asked why she is taking both, stating that she was taking Suboxone for other reasons.  She was requesting to increase the dose of oxycodone due to uncontrolled pain, stating that she was taking 15 mg at home and here she was getting only 5 mg.  Dose increased to 10 mg.  Need to discontinue Suboxone if she remained on oxycodone.   5/4: Vital stable.  Slight worsening of creatinine so Lasix infusion was discontinued.  Patient became very upset and rude when told that she might be going home soon.  Continue to have significant lower extremity edema.  Patient is being noncompliant with her fluid restrictions while in the hospital, apparently family members are bringing different drinks.  She was not saving most of her urine so no strict intake and output recorded.  She was very threatening with the potential discharge and rudely saying that she wanted a different physician and second opinion and asking me to get out of the room.  Charge nurse notified.   Cardiology was also consulted-she is a  known to the practice and never showed up for her follow-up appointments. Cardiology is recommending trying 1 dose of IV Lasix with metolazone with close monitoring of renal function for 1 day.   5/5: Hemodynamically stable.  No strict in and out recorded. Nephrology started her on torsemide and metolazone was also added.  Improving lower extremity edema but continued to have 2+.  She was having excessive daytime sleepiness -likely some element of sleep apnea due to her weight, she will need outpatient sleep study.   5/6: Remained hemodynamically stable.  Weight with some minor increase which does not make sense.  Heart failure team is not recommending right heart catheterization for measurements of her pressure but she refused.  They are going to try little higher doses of Lasix with Diamox to see her response.  Continue to have lower extremity edema.   5/7: Hemodynamically stable.  Refusing some care including insulin.  Slight increase in creatinine but cardiology would like to continue current dose of Lasix for another day.  Diuresing well   5/8: She refused right heart cath at first but later changed her mind wanting higher dose of oxycodone which I have refused as I am concerned about narcotic abuse  5/9: Right heart cath showing elevated RA pressure and severe RV failure.  Transferred to stepdown for milrinone and Lasix drip with PICC line per Dr. Shirlee Latch     Assessment and Plan:   # Hyperglycemia, uncontrolled diabetes mellitus due to noncompliance IDDM T2 Controlled for now   # Anasarca, acute on chronic diastolic CHF  patient has significant bilateral lower extremity edema D-dimer negative, venous duplex negative for DVT US renal: The kidneys are unremarkable. A large postvoid residual of 108 cc is identified. The bladder is otherwise unremarkable. She was initially started on IV Lasix followed by Lasix infusion.  Did develop contraction alkalosis due to high rates and nephrology was  consulted.  Rate was decreased.  Per patient she is peeing good but not providing all the urine for measurement.  Not following the directions for fluid restriction. Patient remained on Lasix infusion for many days and discontinued in the interim due to mild hyponatremia and some worsening of creatinine. Cardiology/advanced CHF team and nephrology managing her fluid balance Apparently she was noncompliant and not following as outpatient.   -Right heart cath earlier today showing elevated RA pressure, severe RV failure, mild pulmonary venous hypertension -Considering rising creatinine and elevated right sided filling pressure Dr. Shirlee Latch recommended aggressive management in the stepdown with PICC line -Lasix drip and milrinone for RV support Continue spironolactone and Farxiga   # Accelerated hypertension  Continue spironolactone   # Chronic pain Patient endorses chronic pain for which she states she was previously on Percocet.  PDMP reviewed and last prescription for oxycodone was in November 2023 and 1 before was in April 2023.  Per pharmacist review - Conservative management and recommend outpatient follow-up with PCP 4/28 patient stated no allergy to Tylenol, so started Tylenol 650 mg p.o. every 6 hourly as needed 4/29 started oxycodone 5 mg p.o. every 6 hours as needed 5/2: Oxycodone dose increased to 10 mg p.o. every 6 hourly as needed.  5/8: Patient requested to go up on her oxycodone at 15 mg which I have refused as I am concerned about abuse.  She gets her Suboxone filled by 2 different providers per pharmacist review although they are on time and not too early.    # Polysubstance abuse - Continue home Suboxone   # Sore throat Patient states that she developed perioral erythema and lesions on her tongue after taking a new blood pressure medicine last night.  At this time, she continues to have a sore throat.  On exam, she has some mild erythema, but no edema. - Supportive management    # Obesity Body mass index is 46.89 kg/m.  Interventions: Calorie restricted diet and daily exercise advised to lose body weight.  Lifestyle modification discussed.   # Vitamin D deficiency: started vitamin D 50,000 units p.o. weekly, follow with PCP to repeat vitamin D level after 3 to 6 months. #Vitamin B12 level 205 at lower end, goal >400, started vitamin B12 1000 mcg IM injection daily for 7 days during hospital stay followed by oral supplement at discharge.  Follow with PCP to repeat B12 level after 3 to 6 months.   Acute on CKD stage II Acute worsening likely due to diuresis.  Nephrology team following  Constipation Last recorded bowel movement on 5/5.  Started on Senokot and MiraLAX     Subjective: She was sleepy.  No new complaints overnight from nursing  Physical Exam: Vitals:   05/18/22 1545 05/18/22 1630 05/18/22 1700 05/18/22 1709  BP: 133/85 (!) 148/67 138/70 138/70  Pulse: 77 81 74 74  Resp: 11 15 11 14   Temp:  98.1 F (36.7 C)  98.1 F (36.7 C)  TempSrc:  Oral  Oral  SpO2: 98% 99% 97%   Weight:    116 kg  Height:    5' (1.524 m)   General.  Morbidly  obese lady, in no acute distress. Pulmonary.  Lungs clear bilaterally, normal respiratory effort. CV.  Regular rate and rhythm, no JVD, rub or murmur. Abdomen.  Soft, nontender, nondistended, BS positive. CNS.  Sleepy today.  No focal neurologic deficit. Extremities.  2+ LE edema, no cyanosis, pulses intact and symmetrical. Psychiatry.  Judgment and insight appears normal.  Data Reviewed:  Creatinine 1.38, right heart cath with elevated right-sided pressure  Family Communication: None at bedside  Disposition: Status is: Inpatient Remains inpatient appropriate because: Management of CHF exacerbation  Planned Discharge Destination: Home   DVT prophylaxis-SCDs Time spent: 35 minutes  Author: Delfino Lovett, MD 05/18/2022 5:16 PM  For on call review [email or web address].

## 2022-05-18 NOTE — Interval H&P Note (Signed)
History and Physical Interval Note:  05/18/2022 1:22 PM  Caitlyn Fuller  has presented today for surgery, with the diagnosis of pulmonary hypertension.  The various methods of treatment have been discussed with the patient and family. After consideration of risks, benefits and other options for treatment, the patient has consented to  Procedure(s): RIGHT HEART CATH (N/A) as a surgical intervention.  The patient's history has been reviewed, patient examined, no change in status, stable for surgery.  I have reviewed the patient's chart and labs.  Questions were answered to the patient's satisfaction.     Aisea Bouldin [employer]

## 2022-05-18 NOTE — Progress Notes (Signed)
       CROSS COVER NOTE  NAME: Caitlyn Fuller MRN: [id number] DOB : [birth year]    HPI/Events of Note   Report:constipation unrelieved with miralax dose given 5/7  On review of chart:history significant of HFpEF, insulin-dependent type 2 diabetes, hypertension, polysubstance abuse, chronic pain syndrome, morbid obesity, chronic opioid therapy    Assessment and  Interventions   Assessment:  Plan: Sena S 2 tabs tonight followed by 1 tab BID starting tomorrow       Donnie Mesa NP [facility]

## 2022-05-19 ENCOUNTER — Encounter: Payer: Self-pay | Admitting: Cardiology

## 2022-05-19 DIAGNOSIS — I5033 Acute on chronic diastolic (congestive) heart failure: Secondary | ICD-10-CM | POA: Diagnosis not present

## 2022-05-19 DIAGNOSIS — E1165 Type 2 diabetes mellitus with hyperglycemia: Secondary | ICD-10-CM | POA: Diagnosis not present

## 2022-05-19 DIAGNOSIS — I10 Essential (primary) hypertension: Secondary | ICD-10-CM | POA: Diagnosis not present

## 2022-05-19 DIAGNOSIS — G894 Chronic pain syndrome: Secondary | ICD-10-CM | POA: Diagnosis not present

## 2022-05-19 LAB — POCT I-STAT EG7
Acid-Base Excess: 1 mmol/L (ref 0.0–2.0)
Bicarbonate: 28.3 mmol/L — ABNORMAL HIGH (ref 20.0–28.0)
Calcium, Ion: 1.19 mmol/L (ref 1.15–1.40)
HCT: 35 % — ABNORMAL LOW (ref 36.0–46.0)
Hemoglobin: 11.9 g/dL — ABNORMAL LOW (ref 12.0–15.0)
O2 Saturation: 64 %
Potassium: 3.7 mmol/L (ref 3.5–5.1)
Sodium: 135 mmol/L (ref 135–145)
TCO2: 30 mmol/L (ref 22–32)
pCO2, Ven: 56 mmHg (ref 44–60)
pH, Ven: 7.311 (ref 7.25–7.43)
pO2, Ven: 37 mmHg (ref 32–45)

## 2022-05-19 LAB — GLUCOSE, CAPILLARY
Glucose-Capillary: 213 mg/dL — ABNORMAL HIGH (ref 70–99)
Glucose-Capillary: 127 mg/dL — ABNORMAL HIGH (ref 70–99)
Glucose-Capillary: 229 mg/dL — ABNORMAL HIGH (ref 70–99)
Glucose-Capillary: 282 mg/dL — ABNORMAL HIGH (ref 70–99)
Glucose-Capillary: 264 mg/dL — ABNORMAL HIGH (ref 70–99)

## 2022-05-19 LAB — BASIC METABOLIC PANEL
Anion gap: 9 (ref 5–15)
BUN: 74 mg/dL — ABNORMAL HIGH (ref 6–20)
CO2: 27 mmol/L (ref 22–32)
Calcium: 7.6 mg/dL — ABNORMAL LOW (ref 8.9–10.3)
Chloride: 97 mmol/L — ABNORMAL LOW (ref 98–111)
Creatinine, Ser: 1.18 mg/dL — ABNORMAL HIGH (ref 0.44–1.00)
GFR, Estimated: 57 mL/min — ABNORMAL LOW (ref >60)
Glucose, Bld: 282 mg/dL — ABNORMAL HIGH (ref 70–99)
Potassium: 3.3 mmol/L — ABNORMAL LOW (ref 3.5–5.1)
Sodium: 133 mmol/L — ABNORMAL LOW (ref 135–145)

## 2022-05-19 LAB — CBC
HCT: 34.3 % — ABNORMAL LOW (ref 36.0–46.0)
Hemoglobin: 10.3 g/dL — ABNORMAL LOW (ref 12.0–15.0)
MCH: 24.8 pg — ABNORMAL LOW (ref 26.0–34.0)
MCHC: 30 g/dL (ref 30.0–36.0)
MCV: 82.5 fL (ref 80.0–100.0)
Platelets: 291 10*3/uL (ref 150–400)
RBC: 4.16 MIL/uL (ref 3.87–5.11)
RDW: 14.9 % (ref 11.5–15.5)
WBC: 11.9 10*3/uL — ABNORMAL HIGH (ref 4.0–10.5)
nRBC: 0 % (ref 0.0–0.2)

## 2022-05-19 LAB — COOXEMETRY PANEL
Carboxyhemoglobin: 1.4 % (ref 0.5–1.5)
Methemoglobin: <0.7 % (ref 0.0–1.5)
O2 Saturation: 82.7 %
Total hemoglobin: 10.7 g/dL — ABNORMAL LOW (ref 12.0–16.0)
Total oxygen content: 81.5 %

## 2022-05-19 MED ORDER — SPIRONOLACTONE 25 MG PO TABS
25.0000 mg | ORAL_TABLET | Freq: Every day | ORAL | Status: DC
  Administered 2022-05-19 – 2022-05-20 (×2): 25 mg via ORAL
  Filled 2022-05-19 (×2): qty 1

## 2022-05-19 MED ORDER — CEPHALEXIN 500 MG PO CAPS
500.0000 mg | ORAL_CAPSULE | Freq: Two times a day (BID) | ORAL | Status: DC
  Administered 2022-05-19 – 2022-05-20 (×3): 500 mg via ORAL
  Filled 2022-05-19 (×3): qty 1

## 2022-05-19 MED ORDER — POTASSIUM CHLORIDE CRYS ER 20 MEQ PO TBCR
40.0000 meq | EXTENDED_RELEASE_TABLET | Freq: Two times a day (BID) | ORAL | Status: AC
  Administered 2022-05-19 (×2): 40 meq via ORAL
  Filled 2022-05-19 (×2): qty 2

## 2022-05-19 MED ORDER — TORSEMIDE 20 MG PO TABS
60.0000 mg | ORAL_TABLET | Freq: Every day | ORAL | Status: DC
  Administered 2022-05-19 – 2022-05-20 (×2): 60 mg via ORAL
  Filled 2022-05-19 (×2): qty 3

## 2022-05-19 MED ORDER — BISACODYL 10 MG RE SUPP
10.0000 mg | Freq: Every day | RECTAL | Status: DC
  Administered 2022-05-19 – 2022-05-20 (×2): 10 mg via RECTAL
  Filled 2022-05-19 (×2): qty 1

## 2022-05-19 NOTE — Progress Notes (Signed)
Patient ID: Caitlyn Fuller, female   DOB: 1975/12/08, 47 y.o.   MRN: [id number]     Advanced Heart Failure Rounding Note  PCP-Cardiologist: None   Subjective:    Creatinine lower today at 1.18. She is on milrinone 0.125 and Lasix gtt 12 mg/hr. CVP is now 7-8, improved from yesterday.  I/Os not markedly negative but probably not fully recorded.   Main complaint remains back pain.   RHC Procedural Findings: Hemodynamics (mmHg) Prominent respiratory variation RA mean 20 RV 45/24 PA 45/27, mean 30 PCWP mean 24 Oxygen saturations: PA 64% AO 100% Cardiac Output (Fick) 4.77  Cardiac Index (Fick) 2.3 PVR 1.25  PAPi 0.9 CVP/PCWP 0.83   Objective:   Weight Range: 116 kg Body mass index is 49.94 kg/m.   Vital Signs:   Temp:  [97.8 F (36.6 C)-98.6 F (37 C)] 97.9 F (36.6 C) (05/10 0400) Pulse Rate:  [72-104] 86 (05/10 0400) Resp:  [8-20] 18 (05/10 0400) BP: (94-218)/(51-195) 125/70 (05/10 0700) SpO2:  [94 %-100 %] 94 % (05/10 0400) Weight:  [213 kg] 116 kg (05/09 1709) Last BM Date : 05/18/22 (BM very small - pt c/o constipation)  Weight change: Filed Weights   05/17/22 0336 05/18/22 0411 05/18/22 1709  Weight: 116.3 kg 116.1 kg 116 kg    Intake/Output:   Intake/Output Summary (Last 24 hours) at 05/19/2022 0805 Last data filed at 05/19/2022 0800 Gross per 24 hour  Intake 1283.2 ml  Output 3551 ml  Net -2267.8 ml      Physical Exam    General: NAD Neck: Thick, JVP difficult, no thyromegaly or thyroid nodule.  Lungs: Clear to auscultation bilaterally with normal respiratory effort. CV: Nondisplaced PMI.  Heart regular S1/S2, no S3/S4, no murmur.  Trace ankle edema.  Abdomen: Soft, nontender, no hepatosplenomegaly, no distention.  Skin: Intact without lesions or rashes.  Neurologic: Alert and oriented x 3.  Psych: Normal affect. Extremities: No clubbing or cyanosis.  HEENT: Normal.     Telemetry   NSR (personally reviewed)   Labs     CBC Recent Labs    05/18/22 0342 05/18/22 1449 05/19/22 0551  WBC 13.7*  --  11.9*  HGB 11.8* 12.2 10.3*  HCT 37.7 36.0 34.3*  MCV 81.3  --  82.5  PLT 311  --  291   Basic Metabolic Panel Recent Labs    08/65/78 0342 05/18/22 1449 05/19/22 0551  NA 131* 135 133*  K 3.8 3.8 3.3*  CL 93*  --  97*  CO2 26  --  27  GLUCOSE 246*  --  282*  BUN 93*  --  74*  CREATININE 1.38*  --  1.18*  CALCIUM 8.5*  --  7.6*   Liver Function Tests No results for input(s): "AST", "ALT", "ALKPHOS", "BILITOT", "PROT", "ALBUMIN" in the last 72 hours. No results for input(s): "LIPASE", "AMYLASE" in the last 72 hours. Cardiac Enzymes No results for input(s): "CKTOTAL", "CKMB", "CKMBINDEX", "TROPONINI" in the last 72 hours.  BNP: BNP (last 3 results) Recent Labs    12/01/21 0532 05/01/22 1518 05/15/22 1131  BNP 26.8 142.2* 86.5    ProBNP (last 3 results) No results for input(s): "PROBNP" in the last 8760 hours.   D-Dimer No results for input(s): "DDIMER" in the last 72 hours. Hemoglobin A1C No results for input(s): "HGBA1C" in the last 72 hours. Fasting Lipid Panel No results for input(s): "CHOL", "HDL", "LDLCALC", "TRIG", "CHOLHDL", "LDLDIRECT" in the last 72 hours. Thyroid Function Tests No results for input(s): "  TSH", "T4TOTAL", "T3FREE", "THYROIDAB" in the last 72 hours.  Invalid input(s): "FREET3"  Other results:   Imaging    CARDIAC CATHETERIZATION  Result Date: 05/18/2022 1. Elevated RA pressure > PCWP.  2. Severe RV failure with low PAPi 0.9 3. Mild pulmonary venous hypertension. 4. CI 2.3 Difficult situation, creatinine rising but still with significantly elevated right-sided filling pressures.  I will place PICC line.  Will start milrinone 0.125 for RV support.  Lasix 60 mg IV x 1 then gtt at 12 mg/hr. Follow CVP off PICC, exam very difficult.  Step-down bed.   [place] EKG SITE RITE  Result Date: 05/18/2022 If Site Rite image not attached, placement could not be  confirmed due to current cardiac rhythm.    Medications:     Scheduled Medications:  acetaminophen  650 mg Oral Q8H   aspirin EC  81 mg Oral Daily   atorvastatin  10 mg Oral Daily   buprenorphine-naloxone  1 tablet Sublingual TID   Chlorhexidine Gluconate Cloth  6 each Topical Daily   vitamin B-12  1,000 mcg Oral Daily   dapagliflozin propanediol  10 mg Oral Daily   enoxaparin (LOVENOX) injection  60 mg Subcutaneous Q24H   insulin aspart  0-20 Units Subcutaneous TID AC & HS   insulin aspart  30 Units Subcutaneous TID WC   insulin glargine-yfgn  80 Units Subcutaneous BID   polyethylene glycol  17 g Oral Daily   potassium chloride  40 mEq Oral BID   senna-docusate  2 tablet Oral BID   sodium chloride flush  10-40 mL Intracatheter Q12H   sodium chloride flush  3 mL Intravenous Q12H   sodium chloride flush  3 mL Intravenous Q12H   sodium chloride flush  3 mL Intravenous Q12H   spironolactone  25 mg Oral Daily   torsemide  60 mg Oral Daily   Vitamin D (Ergocalciferol)  50,000 Units Oral Q7 days    Infusions:  sodium chloride     sodium chloride      PRN Medications: sodium chloride, sodium chloride, acetaminophen, acetaminophen, ALPRAZolam, diclofenac Sodium, fluticasone, [DISCONTINUED] hydrALAZINE **OR** hydrALAZINE, hydrOXYzine, ondansetron (ZOFRAN) IV, ondansetron (ZOFRAN) IV, oxyCODONE, sodium chloride flush, sodium chloride flush, sodium chloride flush     Assessment/Plan   1. Acute on chronic diastolic CHF: Echo in 4/24 with EF 60-65%, mild LVH, normal RV.  She was admitted about 2 wks ago with weight gain and exertional dyspnea.  Diuresis has been difficult.  Exam is difficult for volume.  BUN has been high. Based on RHC on 5/9, she has prominent RV failure along with HFpEF (mean PCWP 24, PA 45/27, mean RA 20, PAPi 0.9, CI 2.3).  She has very poor understanding of CHF/current health status.  Milrinone 0.125 was started for RV support and she has been on Lasix 12 mg/hr.   This morning, CVP down to 7-8 with co-ox 82%. BUN/creatinine lower.  - Stop milrinone and Lasix gtt this morning.  - Start torsemide 60 mg daily and replace K.   - Continue Farxiga 10 mg daily.  Suspect benefit will outweigh GU risk.  - Continue spironolactone 25 mg daily.  - She needs close outpatient followup, will see if we can get her help with transportation (no car).  2. AKI: Primarily elevated BUN.  Suspect cardiorenal syndrome with significant RV failure. BUN/creatinine lower today with milrinone.   - Follow closely.  3. Morbid obesity: Would suggest GLP-1 agonist as outpatient.  4. HTN: Continue spironolactone 25, stopped irbesartan  with rise in creatinine. BP stable.  5. OSA: Strong suspicion for this.  Needs sleep study as outpatient.   If BUN/creatinine remain stable on po meds, possible home tomorrow.   Length of Stay: 5  Marca Ancona, MD  05/19/2022, 8:05 AM  Advanced Heart Failure Team Pager [phone] (M-F; 7a - 5p)  Please contact [facility] for night-coverage after hours (5p -7a ) and weekends on [email or web address]

## 2022-05-19 NOTE — Progress Notes (Signed)
[facility]  ROUNDING NOTE   Subjective:   Patient underwent right heart cath yesterday. Right heart cath showed elevated RA pressure and severe RV failure. She was transitioned to stepdown for milrinone and Lasix drip. These were both stopped this a.m.  Objective:  Vital signs in last 24 hours:  Temp:  [97.8 F (36.6 C)-98.4 F (36.9 C)] 98.4 F (36.9 C) (05/10 0830) Pulse Rate:  [72-104] 86 (05/10 0400) Resp:  [8-20] 18 (05/10 0400) BP: (94-218)/(51-195) 125/70 (05/10 0700) SpO2:  [94 %-100 %] 94 % (05/10 0400) Weight:  [161 kg] 116 kg (05/09 1709)  Weight change: -0.075 kg Filed Weights   05/17/22 0336 05/18/22 0411 05/18/22 1709  Weight: 116.3 kg 116.1 kg 116 kg    Intake/Output: I/O last 3 completed shifts: In: 1542.9 [P.O.:1350; I.V.:192.9] Out: 4301 [Urine:4301]   Intake/Output this shift:  Total I/O In: 10.4 [I.V.:10.4] Out: 1525 [Urine:1525]  Physical Exam: General: NAD  Head: Normocephalic, atraumatic. Moist oral mucosal membranes  Eyes: Anicteric  Lungs:  Clear to auscultation, normal effort  Heart: Regular rate and rhythm  Abdomen:  Soft, nontender  Extremities: Trace peripheral edema  Neurologic: Alert and oriented, moving all four extremities  Skin: No lesions  Access: None    Basic Metabolic Panel: Recent Labs  Lab 05/15/22 0415 05/16/22 0534 05/17/22 0436 05/18/22 0342 05/18/22 1449 05/19/22 0551  NA 131* 131* 131* 131* 135 133*  K 4.4 3.8 3.7 3.8 3.8 3.3*  CL 89* 87* 88* 93*  --  97*  CO2 34* 32 29 26  --  27  GLUCOSE 332* 274* 288* 246*  --  282*  BUN 65* 78* 87* 93*  --  74*  CREATININE 1.11* 1.20* 1.31* 1.38*  --  1.18*  CALCIUM 8.9 9.2 8.9 8.5*  --  7.6*     Liver Function Tests: No results for input(s): "AST", "ALT", "ALKPHOS", "BILITOT", "PROT", "ALBUMIN" in the last 168 hours. No results for input(s): "LIPASE", "AMYLASE" in the last 168 hours. No results for input(s): "AMMONIA" in the last 168  hours.  CBC: Recent Labs  Lab 05/18/22 0342 05/18/22 1449 05/19/22 0551  WBC 13.7*  --  11.9*  HGB 11.8* 12.2 10.3*  HCT 37.7 36.0 34.3*  MCV 81.3  --  82.5  PLT 311  --  291     Cardiac Enzymes: No results for input(s): "CKTOTAL", "CKMB", "CKMBINDEX", "TROPONINI" in the last 168 hours.  BNP: Invalid input(s): "POCBNP"  CBG: Recent Labs  Lab 05/18/22 0832 05/18/22 1131 05/18/22 1340 05/18/22 1607 05/18/22 2134  GLUCAP 273* 210* 152* 138* 211*     Microbiology: Results for orders placed or performed during the hospital encounter of 05/01/22  MRSA Next Gen by PCR, Nasal     Status: None   Collection Time: 05/18/22  4:26 PM   Specimen: Nasal Mucosa; Nasal Swab  Result Value Ref Range Status   MRSA by PCR Next Gen NOT DETECTED NOT DETECTED Final    Comment: (NOTE) The GeneXpert MRSA Assay (FDA approved for NASAL specimens only), is one component of a comprehensive MRSA colonization surveillance program. It is not intended to diagnose MRSA infection nor to guide or monitor treatment for MRSA infections. Test performance is not FDA approved in patients less than 64 years old. Performed at [facility], [address] [place], [place] [address]     Coagulation Studies: No results for input(s): "LABPROT", "INR" in the last 72 hours.  Urinalysis: No results for input(s): "COLORURINE", "LABSPEC", "  PHURINE", "GLUCOSEU", "HGBUR", "BILIRUBINUR", "KETONESUR", "PROTEINUR", "UROBILINOGEN", "NITRITE", "LEUKOCYTESUR" in the last 72 hours.  Invalid input(s): "APPERANCEUR"    Imaging: CARDIAC CATHETERIZATION  Result Date: 05/18/2022 1. Elevated RA pressure > PCWP.  2. Severe RV failure with low PAPi 0.9 3. Mild pulmonary venous hypertension. 4. CI 2.3 Difficult situation, creatinine rising but still with significantly elevated right-sided filling pressures.  I will place PICC line.  Will start milrinone 0.125 for RV support.  Lasix 60 mg IV x 1 then gtt at 12  mg/hr. Follow CVP off PICC, exam very difficult.  Step-down bed.   [place] EKG SITE RITE  Result Date: 05/18/2022 If Site Rite image not attached, placement could not be confirmed due to current cardiac rhythm.    Medications:    sodium chloride     sodium chloride      acetaminophen  650 mg Oral Q8H   aspirin EC  81 mg Oral Daily   atorvastatin  10 mg Oral Daily   buprenorphine-naloxone  1 tablet Sublingual TID   Chlorhexidine Gluconate Cloth  6 each Topical Daily   vitamin B-12  1,000 mcg Oral Daily   dapagliflozin propanediol  10 mg Oral Daily   enoxaparin (LOVENOX) injection  60 mg Subcutaneous Q24H   insulin aspart  0-20 Units Subcutaneous TID AC & HS   insulin aspart  30 Units Subcutaneous TID WC   insulin glargine-yfgn  80 Units Subcutaneous BID   polyethylene glycol  17 g Oral Daily   potassium chloride  40 mEq Oral BID   senna-docusate  2 tablet Oral BID   sodium chloride flush  10-40 mL Intracatheter Q12H   sodium chloride flush  3 mL Intravenous Q12H   sodium chloride flush  3 mL Intravenous Q12H   sodium chloride flush  3 mL Intravenous Q12H   spironolactone  25 mg Oral Daily   torsemide  60 mg Oral Daily   Vitamin D (Ergocalciferol)  50,000 Units Oral Q7 days   sodium chloride, sodium chloride, acetaminophen, acetaminophen, ALPRAZolam, diclofenac Sodium, fluticasone, [DISCONTINUED] hydrALAZINE **OR** hydrALAZINE, hydrOXYzine, ondansetron (ZOFRAN) IV, ondansetron (ZOFRAN) IV, oxyCODONE, sodium chloride flush, sodium chloride flush, sodium chloride flush  Assessment/ Plan:  Caitlyn Fuller is a 47 y.o.  female  with past medical conditions including uncontrolled type 2 diabetes, hypertension, obesity, heart failure, polysubstance abuse, who was admitted to [facility] on 05/01/2022 for Anasarca [R60.1] Hypertensive urgency [I16.0] Acute on chronic systolic CHF (congestive heart failure) (HCC) [I50.23] Hypervolemia, unspecified hypervolemia type [E87.70] Uncontrolled type  2 diabetes mellitus with hyperglycemia (HCC) [E11.65] Acute on chronic heart failure with preserved ejection fraction (HFpEF) (HCC) [I50.33]    Acute kidney injury/chronic kidney disease stage II likely secondary to uncontrolled hypertension and type 2 diabetes.  Baseline creatinine appears to be 0.88 with GFR 82 on 12/12/2021.  Renal ultrasound negative for obstruction or echogenicity.    BUN down to 74 with a creatinine down to 1.18.  Lasix and milrinone drips have been stopped this a.m.  We will continue to monitor renal parameters closely.  Continue Farxiga at this time.  2. Acute on chronic diastolic heart failure with volume overload.  Echo from 05/08/2022 shows EF 60 to 65% with grade 2 diastolic dysfunction.  Cardiology following.   Patient transitioned off of Lasix and milrinone drip.  She remains on torsemide and spironolactone.  6   3.  Acute metabolic alkalosis, serum bicarbonate down to 27.  Continue to monitor.   4.  Hyponatremia/hypokalemia.  Serum sodium  improved to 133.  Potassium still slightly low at 3.3.  Patient on potassium chloride repletion.   5. Anemia of chronic kidney disease Lab Results  Component Value Date   HGB 10.3 (L) 05/19/2022    Hemoglobin within optimal range  6. 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 Home regimen includes NovoLog and Levemir. Most recent hemoglobin A1c is 14.3 on 05/02/22.    Primary team to manage SSI   7.  Hypertension with chronic kidney disease.  Home regimen includes irbesartan and furosemide.  Now receiving torsemide and spironolactone.   LOS: 17 Caitlyn Fuller 5/10/202410:37 AM

## 2022-05-19 NOTE — Progress Notes (Signed)
Pt states she is missing clothing that did not transfer with her after she was transferred to ICU post cath.  This RN spoke with ICU, RN and Licensed conveyancer who said there were no belongings left behind.  Pt also states that she checked with her daughter, who said she did not take the belongings home.

## 2022-05-19 NOTE — Progress Notes (Addendum)
   Heart Failure Nurse Navigator Note  Met with patient who was in the stepdown unit.  She states that she is being transferred out to the floor.  By teach back method went over importance of sticking with her fluid restriction of 64 ounces, her sodium restriction and daily weights.   Discussed  ways to keep track of her p.o. intake.  Needs some reinforcement.  Stressed being compliant with her medications.  She was given the education materials of living with heart failure teaching booklet, zone magnet, info on low-sodium and heart failure along with weight chart.  Discussed follow-up in the outpatient heart failure clinic for which she has an appointment with Dr. Shirlee Latch on Wednesday, 29 May at 1:20 in the afternoon.  Discussed transportation.  Patient states that she is not going to be living with her significant other anymore as they have broken up.  she is going to be living with her daughter and she will be bringing her to her appointments.  She had no further questions.  Tresa Endo RN [facility]

## 2022-05-19 NOTE — Progress Notes (Signed)
Progress Note   Patient: Caitlyn Fuller:[id number] DOB: Jan 23, 1975 DOA: 05/01/2022     17 DOS: the patient was seen and examined on 05/19/2022   Brief hospital course: No notes on file  Assessment and Plan: * Acute on chronic heart failure with preserved ejection fraction (HFpEF) (HCC) Patient is presenting with several week history of gradually worsening hypervolemia despite reported compliance with her oral Lasix.  She appears markedly hypervolemic on examination, although only mild pulmonary edema noted.  Last echocardiogram in November 2023 with a EF of 60-65% and grade 2 diastolic dysfunction.  - Telemetry monitoring - Start Lasix 40 mg IV twice daily - Strict in and out - Daily weights - No indication at this time for repeat echocardiogram  Primary hypertension Patient is presenting with markedly elevated blood pressure in the setting of medication noncompliance.  She states she has not taken her irbesartan in several weeks now.  She did start a new blood pressure medicine that she may have had an allergic reaction to.  Unable to access her pharmacy records to determine which medication this is.  Previously well-tolerated home irbesartan restarted in the ED.  Of note, patient has symptoms consistent with underlying OSA that is certainly contributing.  - No indication of endorgan damage at this time - Continue irbesartan 75 mg daily and titrate as needed - Continuous pulse oximetry at nighttime to assess for apnea  Uncontrolled type 2 diabetes mellitus with hyperglycemia, with long-term current use of insulin (HCC) Patient presenting with marked hyperglycemia with no evidence of hyperglycemic crisis in the setting of poor outpatient compliance.  Levemir was last filled in February with a 30-day supply.  She received 15 units in the ED with improvement.  - Start Semglee 20 units at bedtime - SSI, resistant - A1c pending  Sore throat Patient states that she developed  perioral erythema and lesions on her tongue after taking a new blood pressure medicine last night.  At this time, she continues to have a sore throat.  On exam, she has some mild erythema, but no edema.  - Supportive management - Will need to obtain pharmacy records to determine what medication she picked up yesterday  Chronic pain Patient endorses chronic pain for which she states she was previously on Percocet.  PDMP reviewed and last prescription for oxycodone was in November 2023.  - Conservative management and recommend outpatient follow-up with PCP  Polysubstance abuse (HCC) - Continue home Suboxone        Subjective: requesting transfer out of ICU as she's somewhat annoyed. Feels better. Thankful that she decided to go for heart cath  Physical Exam: Vitals:   05/19/22 1200 05/19/22 1524 05/19/22 1528 05/19/22 1622  BP:  125/77  (!) 153/60  Pulse:   91 (!) 44  Resp: (!) 21 11 17 18   Temp:  98.4 F (36.9 C)  (!) 97.3 F (36.3 C)  TempSrc:  Oral    SpO2:   97% 97%  Weight:      Height:       General.  Morbidly obese lady, in no acute distress. Pulmonary.  Lungs clear bilaterally, normal respiratory effort. CV.  Regular rate and rhythm, no JVD, rub or murmur. Abdomen.  Soft, nontender, nondistended, BS positive. CNS.  Sleepy today.  No focal neurologic deficit. Extremities.  1+ LE edema, no cyanosis, pulses intact and symmetrical. Psychiatry.  Judgment and insight appears normal.  Data Reviewed:  K 3.3  Family Communication: None at bedside  Disposition:  Status is: Inpatient Remains inpatient appropriate because: mgmt of CHF  Planned Discharge Destination: Home   DVT prophylaxis - SCDs Time spent: 35 minutes  Author: Delfino Lovett, MD 05/19/2022 7:08 PM  For on call review [email or web address].

## 2022-05-19 NOTE — Progress Notes (Signed)
Pt transported to room 235 A accompanied by charge RN, and student.

## 2022-05-19 NOTE — Consult Note (Signed)
PHARMACY CONSULT NOTE - FOLLOW UP  Pharmacy Consult for Electrolyte Monitoring and Replacement   Recent Labs: Potassium (mmol/L)  Date Value  05/19/2022 3.3 (L)  08/02/2013 4.0   Magnesium (mg/dL)  Date Value  16/10/9602 2.0   Calcium (mg/dL)  Date Value  54/09/8117 7.6 (L)   Calcium, Total (mg/dL)  Date Value  14/78/2956 7.7 (L)   Albumin (g/dL)  Date Value  21/30/8657 3.0 (L)  08/01/2013 2.7 (L)   Phosphorus (mg/dL)  Date Value  84/69/6295 5.2 (H)   Sodium (mmol/L)  Date Value  05/19/2022 133 (L)  08/02/2013 132 (L)     Assessment: 47 y.o. female with medical history significant of HFpEF, insulin-dependent type 2 diabetes, hypertension, polysubstance abuse, chronic pain syndrome, morbid obesity, who presents to the ED with complaints of elevated blood sugar. Patient is noncompliant with her medications. Pharmacy has been consulted to monitor and replace electrolytes.  Pertinent medications: spironolactone 25mg  daily, torsemide 60mg  daily  Goal of Therapy:  Electrolytes WNL  Plan:  - KCL x 2 doses - Will recheck all electrolytes with AM labs tomorrow  Bettey Costa ,PharmD Clinical Pharmacist 05/19/2022 12:05 PM

## 2022-05-19 NOTE — Progress Notes (Signed)
Report called to [facility], on 2A. Pt to transfer to room 235.

## 2022-05-20 DIAGNOSIS — R601 Generalized edema: Secondary | ICD-10-CM

## 2022-05-20 DIAGNOSIS — E8770 Fluid overload, unspecified: Secondary | ICD-10-CM

## 2022-05-20 DIAGNOSIS — Z7984 Long term (current) use of oral hypoglycemic drugs: Secondary | ICD-10-CM

## 2022-05-20 DIAGNOSIS — I160 Hypertensive urgency: Secondary | ICD-10-CM | POA: Diagnosis not present

## 2022-05-20 DIAGNOSIS — I272 Pulmonary hypertension, unspecified: Secondary | ICD-10-CM

## 2022-05-20 DIAGNOSIS — I5033 Acute on chronic diastolic (congestive) heart failure: Secondary | ICD-10-CM | POA: Diagnosis not present

## 2022-05-20 LAB — MAGNESIUM: Magnesium: 2.6 mg/dL — ABNORMAL HIGH (ref 1.7–2.4)

## 2022-05-20 LAB — CBC
HCT: 35.7 % — ABNORMAL LOW (ref 36.0–46.0)
Hemoglobin: 10.8 g/dL — ABNORMAL LOW (ref 12.0–15.0)
MCH: 24.7 pg — ABNORMAL LOW (ref 26.0–34.0)
MCHC: 30.3 g/dL (ref 30.0–36.0)
MCV: 81.7 fL (ref 80.0–100.0)
Platelets: 280 10*3/uL (ref 150–400)
RBC: 4.37 MIL/uL (ref 3.87–5.11)
RDW: 14.8 % (ref 11.5–15.5)
WBC: 12 10*3/uL — ABNORMAL HIGH (ref 4.0–10.5)
nRBC: 0 % (ref 0.0–0.2)

## 2022-05-20 LAB — BASIC METABOLIC PANEL
Anion gap: 6 (ref 5–15)
BUN: 61 mg/dL — ABNORMAL HIGH (ref 6–20)
CO2: 28 mmol/L (ref 22–32)
Calcium: 8.2 mg/dL — ABNORMAL LOW (ref 8.9–10.3)
Chloride: 99 mmol/L (ref 98–111)
Creatinine, Ser: 1.11 mg/dL — ABNORMAL HIGH (ref 0.44–1.00)
GFR, Estimated: >60 mL/min (ref >60)
Glucose, Bld: 320 mg/dL — ABNORMAL HIGH (ref 70–99)
Potassium: 4.2 mmol/L (ref 3.5–5.1)
Sodium: 133 mmol/L — ABNORMAL LOW (ref 135–145)

## 2022-05-20 LAB — COOXEMETRY PANEL
Carboxyhemoglobin: 1.3 % (ref 0.5–1.5)
Methemoglobin: <0.7 % (ref 0.0–1.5)
O2 Saturation: 78.1 %
Total hemoglobin: 11.2 g/dL — ABNORMAL LOW (ref 12.0–16.0)
Total oxygen content: 77.1 %

## 2022-05-20 LAB — GLUCOSE, CAPILLARY
Glucose-Capillary: 261 mg/dL — ABNORMAL HIGH (ref 70–99)
Glucose-Capillary: 294 mg/dL — ABNORMAL HIGH (ref 70–99)
Glucose-Capillary: 73 mg/dL (ref 70–99)

## 2022-05-20 LAB — PHOSPHORUS: Phosphorus: 4 mg/dL (ref 2.5–4.6)

## 2022-05-20 MED ORDER — CEPHALEXIN 500 MG PO CAPS: 500.0000 mg | ORAL_CAPSULE | Freq: Two times a day (BID) | ORAL | 0 refills | Status: DC

## 2022-05-20 MED ORDER — ASPIRIN 81 MG PO TBEC: 81.0000 mg | DELAYED_RELEASE_TABLET | Freq: Every day | ORAL | 12 refills | Status: DC

## 2022-05-20 MED ORDER — ATORVASTATIN CALCIUM 10 MG PO TABS: 10.0000 mg | ORAL_TABLET | Freq: Every day | ORAL | 0 refills | Status: DC

## 2022-05-20 MED ORDER — SPIRONOLACTONE 25 MG PO TABS: 25.0000 mg | ORAL_TABLET | Freq: Every day | ORAL | 0 refills | Status: DC

## 2022-05-20 MED ORDER — SPIRONOLACTONE 25 MG PO TABS: 25.0000 mg | ORAL_TABLET | Freq: Every day | ORAL | 0 refills | Status: AC

## 2022-05-20 MED ORDER — CEPHALEXIN 500 MG PO CAPS: 500.0000 mg | ORAL_CAPSULE | Freq: Two times a day (BID) | ORAL | 0 refills | Status: AC

## 2022-05-20 MED ORDER — TORSEMIDE 60 MG PO TABS: 60.0000 mg | ORAL_TABLET | Freq: Every day | ORAL | 0 refills | Status: AC

## 2022-05-20 MED ORDER — TORSEMIDE 60 MG PO TABS: 60.0000 mg | ORAL_TABLET | Freq: Every day | ORAL | 0 refills | Status: DC

## 2022-05-20 MED ORDER — DAPAGLIFLOZIN PROPANEDIOL 10 MG PO TABS: 10.0000 mg | ORAL_TABLET | Freq: Every day | ORAL | 0 refills | Status: AC

## 2022-05-20 MED ORDER — DAPAGLIFLOZIN PROPANEDIOL 10 MG PO TABS: 10.0000 mg | ORAL_TABLET | Freq: Every day | ORAL | 0 refills | Status: DC

## 2022-05-20 NOTE — Progress Notes (Signed)
[facility]  PROGRESS NOTE   Subjective:   Patient seen at bedside.  She is now off of milrinone and Lasix drip. Breathing much better.  Following the fluid restriction protocol. #1 torsemide at 60 mg daily.  Objective:  Vital signs: Blood pressure (!) 143/83, pulse 84, temperature 97.7 F (36.5 C), temperature source Oral, resp. rate 20, height 5' (1.524 m), weight 117.3 kg, SpO2 99 %.  Intake/Output Summary (Last 24 hours) at 05/20/2022 1303 Last data filed at 05/20/2022 1031 Gross per 24 hour  Intake 727 ml  Output --  Net 727 ml   Filed Weights   05/18/22 0411 05/18/22 1709 05/20/22 0500  Weight: 116.1 kg 116 kg 117.3 kg     Physical Exam: General:  No acute distress  Head:  Normocephalic, atraumatic. Moist oral mucosal membranes  Eyes:  Anicteric  Neck:  Supple  Lungs:   Clear to auscultation, normal effort  Heart:  S1S2 no rubs  Abdomen:   Soft, nontender, bowel sounds present  Extremities: 1+ peripheral edema.  Neurologic:  Awake, alert, following commands  Skin:  No lesions  Access:     Basic Metabolic Panel: Recent Labs  Lab 05/16/22 0534 05/17/22 0436 05/18/22 0342 05/18/22 1449 05/19/22 0551 05/20/22 0445  NA 131* 131* 131* 135  135 133* 133*  K 3.8 3.7 3.8 3.7  3.8 3.3* 4.2  CL 87* 88* 93*  --  97* 99  CO2 32 29 26  --  27 28  GLUCOSE 274* 288* 246*  --  282* 320*  BUN 78* 87* 93*  --  74* 61*  CREATININE 1.20* 1.31* 1.38*  --  1.18* 1.11*  CALCIUM 9.2 8.9 8.5*  --  7.6* 8.2*  MG  --   --   --   --   --  2.6*  PHOS  --   --   --   --   --  4.0   GFR: Estimated Creatinine Clearance: 73.4 mL/min (A) (by C-G formula based on SCr of 1.11 mg/dL (H)).  Liver Function Tests: No results for input(s): "AST", "ALT", "ALKPHOS", "BILITOT", "PROT", "ALBUMIN" in the last 168 hours. No results for input(s): "LIPASE", "AMYLASE" in the last 168 hours. No results for input(s): "AMMONIA" in the last 168 hours.  CBC: Recent Labs  Lab  05/18/22 0342 05/18/22 1449 05/19/22 0551 05/20/22 0445  WBC 13.7*  --  11.9* 12.0*  HGB 11.8* 11.9*  12.2 10.3* 10.8*  HCT 37.7 35.0*  36.0 34.3* 35.7*  MCV 81.3  --  82.5 81.7  PLT 311  --  291 280     HbA1C: Hemoglobin A1C  Date/Time Value Ref Range Status  08/02/2013 04:12 AM 13.1 (H) 4.2 - 6.3 % Final    Comment:    The American Diabetes Association recommends that a primary goal of therapy should be <7% and that physicians should reevaluate the treatment regimen in patients with HbA1c values consistently >8%.   02/18/2012 05:40 AM 11.5 (H) 4.2 - 6.3 % Final    Comment:    The American Diabetes Association recommends that a primary goal of therapy should be <7% and that physicians should reevaluate the treatment regimen in patients with HbA1c values consistently >8%.    Hgb A1c MFr Bld  Date/Time Value Ref Range Status  05/02/2022 05:53 AM 14.3 (H) 4.8 - 5.6 % Final    Comment:    (NOTE) Pre diabetes:          5.7%-6.4%  Diabetes:              >  6.4%  Glycemic control for   <7.0% adults with diabetes   11/17/2021 12:40 AM 12.5 (H) 4.8 - 5.6 % Final    Comment:    (NOTE) Pre diabetes:          5.7%-6.4%  Diabetes:              >6.4%  Glycemic control for   <7.0% adults with diabetes     Urinalysis: No results for input(s): "COLORURINE", "LABSPEC", "PHURINE", "GLUCOSEU", "HGBUR", "BILIRUBINUR", "KETONESUR", "PROTEINUR", "UROBILINOGEN", "NITRITE", "LEUKOCYTESUR" in the last 72 hours.  Invalid input(s): "APPERANCEUR"    Imaging: CARDIAC CATHETERIZATION  Result Date: 05/18/2022 1. Elevated RA pressure > PCWP.  2. Severe RV failure with low PAPi 0.9 3. Mild pulmonary venous hypertension. 4. CI 2.3 Difficult situation, creatinine rising but still with significantly elevated right-sided filling pressures.  I will place PICC line.  Will start milrinone 0.125 for RV support.  Lasix 60 mg IV x 1 then gtt at 12 mg/hr. Follow CVP off PICC, exam very difficult.   Step-down bed.   [place] EKG SITE RITE  Result Date: 05/18/2022 If Site Rite image not attached, placement could not be confirmed due to current cardiac rhythm.    Medications:    sodium chloride     sodium chloride      acetaminophen  650 mg Oral Q8H   aspirin EC  81 mg Oral Daily   atorvastatin  10 mg Oral Daily   bisacodyl  10 mg Rectal Daily   buprenorphine-naloxone  1 tablet Sublingual TID   cephALEXin  500 mg Oral Q12H   Chlorhexidine Gluconate Cloth  6 each Topical Daily   vitamin B-12  1,000 mcg Oral Daily   dapagliflozin propanediol  10 mg Oral Daily   enoxaparin (LOVENOX) injection  60 mg Subcutaneous Q24H   insulin aspart  0-20 Units Subcutaneous TID AC & HS   insulin aspart  30 Units Subcutaneous TID WC   insulin glargine-yfgn  80 Units Subcutaneous BID   polyethylene glycol  17 g Oral Daily   senna-docusate  2 tablet Oral BID   sodium chloride flush  10-40 mL Intracatheter Q12H   sodium chloride flush  3 mL Intravenous Q12H   sodium chloride flush  3 mL Intravenous Q12H   sodium chloride flush  3 mL Intravenous Q12H   spironolactone  25 mg Oral Daily   torsemide  60 mg Oral Daily   Vitamin D (Ergocalciferol)  50,000 Units Oral Q7 days    Assessment/ Plan:     47 year old female with history of diabetes, peripheral vascular disease, hypertension, coronary artery disease, congestive heart failure, polysubstance abuse now admitted for congestive heart failure and anasarca.  #1: Acute kidney injury on chronic kidney disease: Renal indices are now improving.  Renal ultrasound is negative.  She is presently off of milrinone and Lasix drip.  #2: Congestive heart failure: Significantly improved with milrinone and Lasix.  Now on spironolactone and torsemide.  Advised her to stay on fluid 1000 cc fluid restriction.  #3: Diabetes: Continue the insulin and also [place] for long-term cardiorenal protection.  #4: Hypertension: Will continue to monitor closely.  #5: Anemia:  Will continue to monitor closely.  Will follow.    LOS: 18 Lorain Childes, MD [facility] 5/11/20241:03 PM

## 2022-05-20 NOTE — Consult Note (Signed)
PHARMACY CONSULT NOTE - FOLLOW UP  Pharmacy Consult for Electrolyte Monitoring and Replacement   Recent Labs: Potassium (mmol/L)  Date Value  05/20/2022 4.2  08/02/2013 4.0   Magnesium (mg/dL)  Date Value  16/10/9602 2.6 (H)   Calcium (mg/dL)  Date Value  54/09/8117 8.2 (L)   Calcium, Total (mg/dL)  Date Value  14/78/2956 7.7 (L)   Albumin (g/dL)  Date Value  21/30/8657 3.0 (L)  08/01/2013 2.7 (L)   Phosphorus (mg/dL)  Date Value  84/69/6295 4.0   Sodium (mmol/L)  Date Value  05/20/2022 133 (L)  08/02/2013 132 (L)     Assessment: 47 y.o. female with medical history significant of HFpEF, insulin-dependent type 2 diabetes, hypertension, polysubstance abuse, chronic pain syndrome, morbid obesity, who presents to the ED with complaints of elevated blood sugar. Patient is noncompliant with her medications. Pharmacy has been consulted to monitor and replace electrolytes.  Pertinent medications: spironolactone 25mg  daily, torsemide 60mg  daily  Goal of Therapy:  Electrolytes WNL  Plan:  -No electrolyte replacement at this time - Will f/u electrolytes with AM labs  Angelique Blonder ,PharmD Clinical Pharmacist 05/20/2022 8:40 AM

## 2022-05-20 NOTE — Discharge Summary (Signed)
Physician Discharge Summary   Patient: Caitlyn Fuller MRN: [id number] DOB: 1975/10/03  Admit date:     05/01/2022  Discharge date: 05/20/22  Discharge Physician: Delfino Lovett   PCP: [facility]   Recommendations at discharge:   Follow-up with outpatient providers as requested  Discharge Diagnoses: Principal Problem:   Acute on chronic heart failure with preserved ejection fraction (HFpEF) (HCC) Active Problems:   Primary hypertension   Uncontrolled type 2 diabetes mellitus with hyperglycemia (HCC)   Polysubstance abuse (HCC)   Chronic pain   Sore throat   Acute on chronic systolic CHF (congestive heart failure) (HCC)   Hypervolemia   Hypertensive urgency   Pulmonary HTN [facility])  Hospital Course: Assessment and Plan: * Acute on chronic heart failure with preserved ejection fraction (HFpEF) (HCC) Patient is presenting with several week history of gradually worsening hypervolemia despite reported compliance with her oral Lasix.  She appears markedly hypervolemic on examination, although only mild pulmonary edema noted.  Last echocardiogram in November 2023 with a EF of 60-65% and grade 2 diastolic dysfunction. Echo in April 2024 with EF of 60 to 65%, normal RV Right heart cath on 05/18/2022 showed prominent RV failure along with elevated RA pressure > PCWP. 2. Severe RV failure with low PAPi 0.9 3. Mild pulmonary venous hypertension.  -She was started on Lasix drip with milrinone for RV support and have PICC line with close monitoring in the stepdown by advanced heart failure/cardiology team Net IO Since Admission: -10,046.27 mL [05/20/22 1702]  Was diuresed very well with aggressive diuretics Patient is close to her baseline now and after discussion with cardiology she is being discharged on torsemide 60 mg p.o. daily, Farxiga 10 mg daily and spironolactone 25 mg daily Patient is in agreement with the discharge plan  Primary hypertension Continue  spironolactone  Uncontrolled type 2 diabetes mellitus with hyperglycemia (HCC) Continue home medications for now  Sore throat Patient states that she developed perioral erythema and lesions on her tongue after taking a new blood pressure medicine last night.  At this time, she continues to have a sore throat.  On exam, she has some mild erythema, but no edema. Resolved  Chronic pain Patient endorses chronic pain for which she states she was previously on Percocet.  PDMP reviewed and last prescription for oxycodone was in November 2023. -I had clearly instructed patient I will not be prescribing any narcotics at discharge  Polysubstance abuse (HCC) - Continue home Suboxone  She remains at very high risk for readmission She already has had 4 readmissions in the last 6 months and 1 ED visits also      Consultants: Cardiology, nephrology Procedures performed: Right heart cath Disposition: Home health Diet recommendation:  Discharge Diet Orders (From admission, onward)     Start     Ordered   05/20/22 0000  Diet - low sodium heart healthy        05/20/22 1333           Carb modified diet DISCHARGE MEDICATION: Allergies as of 05/20/2022       Reactions   Amlodipine Swelling   Gabapentin Anaphylaxis   Hydralazine Shortness Of Breath   Patient felt she had panic attack after IV hydralazine administered   Morphine And Related Anaphylaxis   Darvocet [propoxyphene N-acetaminophen] Hives   Toradol [ketorolac Tromethamine] Other (See Comments)   migraines   Ultram [tramadol] Hives   Vancomycin Itching        Medication List  STOP taking these medications    furosemide 40 MG tablet Commonly known as: LASIX   irbesartan 75 MG tablet Commonly known as: AVAPRO   potassium chloride SA 20 MEQ tablet Commonly known as: KLOR-CON M       TAKE these medications    aspirin EC 81 MG tablet Take 1 tablet (81 mg total) by mouth daily. Swallow whole. Start taking  on: May 21, 2022   atorvastatin 10 MG tablet Commonly known as: LIPITOR Take 1 tablet (10 mg total) by mouth daily.   blood glucose meter kit and supplies Kit Dispense based on patient and insurance preference. Check sugar 4 times a day before meals and bedtime   buprenorphine-naloxone 8-2 mg Subl SL tablet Commonly known as: SUBOXONE Place 1 tablet under the tongue 3 (three) times daily.   cephALEXin 500 MG capsule Commonly known as: KEFLEX Take 1 capsule (500 mg total) by mouth every 12 (twelve) hours for 5 days.   dapagliflozin propanediol 10 MG Tabs tablet Commonly known as: FARXIGA Take 1 tablet (10 mg total) by mouth daily. Start taking on: May 21, 2022   insulin aspart 100 UNIT/ML injection Commonly known as: novoLOG 15 units subcutaneous injection prior to meals What changed:  how much to take how to take this when to take this additional instructions   insulin detemir 100 UNIT/ML FlexPen Commonly known as: LEVEMIR Inject 50 Units into the skin at bedtime.   Insulin Pen Needle 33G X 5 MM Misc 1 Dose by Does not apply route 3 (three) times daily before meals.   spironolactone 25 MG tablet Commonly known as: ALDACTONE Take 1 tablet (25 mg total) by mouth daily. Start taking on: May 21, 2022   Torsemide 60 MG Tabs Take 60 mg by mouth daily. Start taking on: May 21, 2022        Follow-up Information     [facility], [employer] Schedule an appointment as soon as possible for a visit in 3 day(s).   Why: [facility] Discharge F/UP Contact information: [address] [place] [address] [phone]         Antonieta Iba, MD. Schedule an appointment as soon as possible for a visit in 1 week(s).   Specialty: Cardiology Why: [facility] Discharge F/UP Contact information: [address] [place] [address] [phone]         Mady Haagensen, MD. Schedule an appointment as soon as possible for a visit in 2  week(s).   Specialty: Nephrology Why: [facility] Discharge F/UP Contact information: [address] [place] [address] [phone]                Discharge Exam: Filed Weights   05/18/22 0411 05/18/22 1709 05/20/22 0500  Weight: 116.1 kg 116 kg 117.3 kg   General.  Morbidly obese lady, in no acute distress. Pulmonary.  Lungs clear bilaterally, normal respiratory effort. CV.  Regular rate and rhythm, no JVD, rub or murmur. Abdomen.  Soft, nontender, nondistended, BS positive. CNS.  Sleepy today.  No focal neurologic deficit. Extremities.  1+ LE edema, no cyanosis, pulses intact and symmetrical. Psychiatry.  Judgment and insight appears normal.   Condition at discharge: fair  The results of significant diagnostics from this hospitalization (including imaging, microbiology, ancillary and laboratory) are listed below for reference.   Imaging Studies: CARDIAC CATHETERIZATION  Result Date: 05/18/2022 1. Elevated RA pressure > PCWP.  2. Severe RV failure with low PAPi  0.9 3. Mild pulmonary venous hypertension. 4. CI 2.3 Difficult situation, creatinine rising but still with significantly elevated right-sided filling pressures.  I will place PICC line.  Will start milrinone 0.125 for RV support.  Lasix 60 mg IV x 1 then gtt at 12 mg/hr. Follow CVP off PICC, exam very difficult.  Step-down bed.   [place] EKG SITE RITE  Result Date: 05/18/2022 If Site Rite image not attached, placement could not be confirmed due to current cardiac rhythm.  ECHOCARDIOGRAM COMPLETE  Result Date: 05/08/2022    ECHOCARDIOGRAM REPORT   Patient Name:   Caitlyn Fuller Date of Exam: 05/08/2022 Medical Rec #:  [id number]           Height:       60.0 in Accession #:    [id number]          Weight:       256.2 lb Date of Birth:  Dec 20, 1975           BSA:          2.074 m Patient Age:    47 years            BP:           115/71 mmHg Patient Gender: F                   HR:           77 bpm. Exam  Location:  [facility] Procedure: 2D Echo, Cardiac Doppler and Color Doppler Indications:     CHF  History:         Patient has prior history of Echocardiogram examinations, most                  recent 11/19/2021. CHF, Stroke,                  Signs/Symptoms:Dizziness/Lightheadedness; Risk                  Factors:Hypertension, Diabetes, Dyslipidemia and Former Smoker.  Sonographer:     Mikki Harbor Referring Phys:  [id number] Gillis Santa Diagnosing Phys: Lorine Bears MD  Sonographer Comments: Suboptimal parasternal window and patient is obese. IMPRESSIONS  1. Left ventricular ejection fraction, by estimation, is 60 to 65%. The left ventricle has normal function. The left ventricle has no regional wall motion abnormalities. There is mild left ventricular hypertrophy. Left ventricular diastolic parameters are consistent with Grade II diastolic dysfunction (pseudonormalization).  2. Right ventricular systolic function is normal. The right ventricular size is normal. Tricuspid regurgitation signal is inadequate for assessing PA pressure.  3. Left atrial size was mildly dilated.  4. The mitral valve is normal in structure. No evidence of mitral valve regurgitation. No evidence of mitral stenosis.  5. The aortic valve is normal in structure. Aortic valve regurgitation is not visualized. No aortic stenosis is present.  6. The inferior vena cava is normal in size with greater than 50% respiratory variability, suggesting right atrial pressure of 3 mmHg. FINDINGS  Left Ventricle: Left ventricular ejection fraction, by estimation, is 60 to 65%. The left ventricle has normal function. The left ventricle has no regional wall motion abnormalities. The left ventricular internal cavity size was normal in size. There is  mild left ventricular hypertrophy. Left ventricular diastolic parameters are consistent with Grade II diastolic dysfunction (pseudonormalization). Right Ventricle: The right ventricular size is normal. No increase in  right ventricular wall thickness. Right ventricular systolic function is normal. Tricuspid regurgitation signal  is inadequate for assessing PA pressure. Left Atrium: Left atrial size was mildly dilated. Right Atrium: Right atrial size was normal in size. Pericardium: There is no evidence of pericardial effusion. Mitral Valve: The mitral valve is normal in structure. No evidence of mitral valve regurgitation. No evidence of mitral valve stenosis. MV peak gradient, 5.5 mmHg. The mean mitral valve gradient is 3.0 mmHg. Tricuspid Valve: The tricuspid valve is normal in structure. Tricuspid valve regurgitation is not demonstrated. No evidence of tricuspid stenosis. Aortic Valve: The aortic valve is normal in structure. Aortic valve regurgitation is not visualized. No aortic stenosis is present. Aortic valve mean gradient measures 7.0 mmHg. Aortic valve peak gradient measures 11.7 mmHg. Aortic valve area, by VTI measures 1.98 cm. Pulmonic Valve: The pulmonic valve was normal in structure. Pulmonic valve regurgitation is not visualized. No evidence of pulmonic stenosis. Aorta: The aortic root is normal in size and structure. Venous: The inferior vena cava is normal in size with greater than 50% respiratory variability, suggesting right atrial pressure of 3 mmHg. IAS/Shunts: No atrial level shunt detected by color flow Doppler.  LEFT VENTRICLE PLAX 2D LVIDd:         4.40 cm   Diastology LVIDs:         2.80 cm   LV e' medial:    6.09 cm/s LV PW:         1.30 cm   LV E/e' medial:  17.9 LV IVS:        1.20 cm   LV e' lateral:   5.22 cm/s LVOT diam:     2.00 cm   LV E/e' lateral: 20.9 LV SV:         69 LV SV Index:   33 LVOT Area:     3.14 cm  RIGHT VENTRICLE RV Basal diam:  3.20 cm RV Mid diam:    2.90 cm RV S prime:     21.10 cm/s TAPSE (M-mode): 2.9 cm LEFT ATRIUM             Index        RIGHT ATRIUM           Index LA diam:        4.50 cm 2.17 cm/m   RA Area:     12.30 cm LA Vol (A2C):   47.1 ml 22.71 ml/m  RA Volume:    25.00 ml  12.06 ml/m LA Vol (A4C):   45.9 ml 22.13 ml/m LA Biplane Vol: 47.2 ml 22.76 ml/m  AORTIC VALVE                     PULMONIC VALVE AV Area (Vmax):    2.08 cm      PV Vmax:       1.24 m/s AV Area (Vmean):   1.94 cm      PV Peak grad:  6.2 mmHg AV Area (VTI):     1.98 cm AV Vmax:           171.00 cm/s AV Vmean:          119.000 cm/s AV VTI:            0.347 m AV Peak Grad:      11.7 mmHg AV Mean Grad:      7.0 mmHg LVOT Vmax:         113.00 cm/s LVOT Vmean:        73.600 cm/s LVOT VTI:  0.219 m LVOT/AV VTI ratio: 0.63  AORTA Ao Root diam: 3.50 cm MITRAL VALVE MV Area (PHT): 3.40 cm     SHUNTS MV Area VTI:   2.10 cm     Systemic VTI:  0.22 m MV Peak grad:  5.5 mmHg     Systemic Diam: 2.00 cm MV Mean grad:  3.0 mmHg MV Vmax:       1.17 m/s MV Vmean:      76.5 cm/s MV Decel Time: 223 msec MV E velocity: 109.00 cm/s MV A velocity: 111.00 cm/s MV E/A ratio:  0.98 Lorine Bears MD Electronically signed by Lorine Bears MD Signature Date/Time: 05/08/2022/5:51:28 PM    Final    US RENAL  Result Date: 05/07/2022 CLINICAL DATA:  Urinary retention. EXAM: RENAL / URINARY TRACT ULTRASOUND COMPLETE COMPARISON:  182.5 FINDINGS: Right Kidney: Renal measurements: 11.1 x 5.2 x 6 cm = volume: 182.5 mL. Echogenicity within normal limits. No mass or hydronephrosis visualized. Left Kidney: Renal measurements: 11 x 6 x 5.7 cm = volume: 199 mL. Echogenicity within normal limits. No mass or hydronephrosis visualized. Bladder: A large postvoid residual of 108 cc is identified. The bladder is otherwise unremarkable. Other: None. IMPRESSION: 1. The kidneys are unremarkable. 2. A large postvoid residual of 108 cc is identified. The bladder is otherwise unremarkable. Electronically Signed   By: Gerome Sam III M.D.   On: 05/07/2022 14:00   US Venous Img Lower Bilateral (DVT)  Result Date: 05/06/2022 CLINICAL DATA:  9965 Edema 9965 EXAM: BILATERAL LOWER EXTREMITY VENOUS DOPPLER ULTRASOUND TECHNIQUE:  Gray-scale sonography with graded compression, as well as color Doppler and duplex ultrasound were performed to evaluate the lower extremity deep venous systems from the level of the common femoral vein and including the common femoral, femoral, profunda femoral, popliteal and calf veins including the posterior tibial, peroneal and gastrocnemius veins when visible. The superficial great saphenous vein was also interrogated. Spectral Doppler was utilized to evaluate flow at rest and with distal augmentation maneuvers in the common femoral, femoral and popliteal veins. COMPARISON:  Lower extremity XRs, 03/20/2022.  Chest XR, 05/01/2022. FINDINGS: RIGHT LOWER EXTREMITY VENOUS Normal compressibility of the RIGHT common femoral, superficial femoral, and popliteal veins, as well as the visualized calf veins. Visualized portions of profunda femoral vein and great saphenous vein unremarkable. No filling defects to suggest DVT on grayscale or color Doppler imaging. Doppler waveforms show normal direction of venous flow, normal respiratory plasticity and response to augmentation. OTHER No evidence of superficial thrombophlebitis or abnormal fluid collection. Limitations: none LEFT LOWER EXTREMITY VENOUS Normal compressibility of the LEFT common femoral, superficial femoral, and popliteal veins, as well as the visualized calf veins. Visualized portions of profunda femoral vein and great saphenous vein unremarkable. No filling defects to suggest DVT on grayscale or color Doppler imaging. Doppler waveforms show normal direction of venous flow, normal respiratory plasticity and response to augmentation. OTHER No evidence of superficial thrombophlebitis or abnormal fluid collection. Limitations: none IMPRESSION: No evidence of femoropopliteal DVT or superficial thrombophlebitis within either lower extremity. Roanna Banning, MD [facility] Radiology Electronically Signed   By: Roanna Banning M.D.   On: 05/06/2022 15:11   DG Chest 2 View  Result Date: 05/01/2022 CLINICAL DATA:  Dyspnea. Volume overload. EXAM: CHEST - 2 VIEW COMPARISON:  03/21/2022 FINDINGS: Lung volumes are low. Mild cardiomegaly. There is vascular congestion with interstitial prominence, possible pulmonary edema. No focal airspace disease. No pleural fluid. No acute osseous abnormalities are seen.  IMPRESSION: Cardiomegaly with vascular congestion and interstitial prominence, possible pulmonary edema. Electronically Signed   By: Narda Rutherford M.D.   On: 05/01/2022 17:12    Microbiology: Results for orders placed or performed during the hospital encounter of 05/01/22  MRSA Next Gen by PCR, Nasal     Status: None   Collection Time: 05/18/22  4:26 PM   Specimen: Nasal Mucosa; Nasal Swab  Result Value Ref Range Status   MRSA by PCR Next Gen NOT DETECTED NOT DETECTED Final    Comment: (NOTE) The GeneXpert MRSA Assay (FDA approved for NASAL specimens only), is one component of a comprehensive MRSA colonization surveillance program. It is not intended to diagnose MRSA infection nor to guide or monitor treatment for MRSA infections. Test performance is not FDA approved in patients less than 83 years old. Performed at [facility], [address] [place], [place] [address]     Labs: CBC: Recent Labs  Lab 05/18/22 [phone] 05/18/22 1449 05/19/22 0551 05/20/22 0445  WBC 13.7*  --  11.9* 12.0*  HGB 11.8* 11.9*  12.2 10.3* 10.8*  HCT 37.7 35.0*  36.0 34.3* 35.7*  MCV 81.3  --  82.5 81.7  PLT 311  --  291 280   Basic Metabolic Panel: Recent Labs  Lab 05/16/22 0534 05/17/22 0436 05/18/22 0342 05/18/22 1449 05/19/22 0551 05/20/22 0445  NA 131* 131* 131* 135  135 133* 133*  K 3.8 3.7 3.8 3.7  3.8 3.3* 4.2  CL 87* 88* 93*  --  97* 99  CO2 32 29 26  --  27 28  GLUCOSE 274* 288* 246*  --  282* 320*  BUN 78* 87* 93*  --  74* 61*  CREATININE 1.20* 1.31* 1.38*  --  1.18* 1.11*  CALCIUM  9.2 8.9 8.5*  --  7.6* 8.2*  MG  --   --   --   --   --  2.6*  PHOS  --   --   --   --   --  4.0   Liver Function Tests: No results for input(s): "AST", "ALT", "ALKPHOS", "BILITOT", "PROT", "ALBUMIN" in the last 168 hours. CBG: Recent Labs  Lab 05/19/22 2121 05/19/22 2244 05/20/22 0430 05/20/22 0930 05/20/22 1253  GLUCAP 282* 264* 261* 294* 73    Discharge time spent: greater than 30 minutes.  Signed: Delfino Lovett, MD [facility] 05/20/2022

## 2022-05-20 NOTE — Progress Notes (Signed)
Cardiology Progress Note   Patient Name: Caitlyn Fuller Date of Encounter: 05/20/2022  Primary Cardiologist: [facility]  Subjective   No complaints this morning except for right forearm which hurts from pustule at site of prior IV, now removed Denies significant shortness of breath, no leg swelling, no abdominal distention  Recent events reviewed, weaned off milrinone and Lasix infusion, transition to torsemide 60 daily CVP had improved 7-8 She has moderated her fluid intake Also on Farxiga, spironolactone  Inpatient Medications    Scheduled Meds:  acetaminophen  650 mg Oral Q8H   aspirin EC  81 mg Oral Daily   atorvastatin  10 mg Oral Daily   bisacodyl  10 mg Rectal Daily   buprenorphine-naloxone  1 tablet Sublingual TID   cephALEXin  500 mg Oral Q12H   Chlorhexidine Gluconate Cloth  6 each Topical Daily   vitamin B-12  1,000 mcg Oral Daily   dapagliflozin propanediol  10 mg Oral Daily   enoxaparin (LOVENOX) injection  60 mg Subcutaneous Q24H   insulin aspart  0-20 Units Subcutaneous TID AC & HS   insulin aspart  30 Units Subcutaneous TID WC   insulin glargine-yfgn  80 Units Subcutaneous BID   polyethylene glycol  17 g Oral Daily   senna-docusate  2 tablet Oral BID   sodium chloride flush  10-40 mL Intracatheter Q12H   sodium chloride flush  3 mL Intravenous Q12H   sodium chloride flush  3 mL Intravenous Q12H   sodium chloride flush  3 mL Intravenous Q12H   spironolactone  25 mg Oral Daily   torsemide  60 mg Oral Daily   Vitamin D (Ergocalciferol)  50,000 Units Oral Q7 days   Continuous Infusions:  sodium chloride     sodium chloride     PRN Meds: sodium chloride, sodium chloride, acetaminophen, acetaminophen, ALPRAZolam, diclofenac Sodium, fluticasone, [DISCONTINUED] hydrALAZINE **OR** hydrALAZINE, hydrOXYzine, ondansetron (ZOFRAN) IV, ondansetron (ZOFRAN) IV, oxyCODONE, sodium chloride flush, sodium chloride flush, sodium chloride flush   Vital Signs     Vitals:   05/19/22 2345 05/20/22 0436 05/20/22 0500 05/20/22 0928  BP: (!) 141/76 131/63  118/68  Pulse: 87 80  78  Resp: 17 20  20   Temp: 97.6 F (36.4 C) 98.2 F (36.8 C)  98 F (36.7 C)  TempSrc: Oral Oral    SpO2: 100% 100%  99%  Weight:   117.3 kg   Height:        Intake/Output Summary (Last 24 hours) at 05/20/2022 1156 Last data filed at 05/20/2022 1031 Gross per 24 hour  Intake 1207 ml  Output --  Net 1207 ml   Filed Weights   05/18/22 0411 05/18/22 1709 05/20/22 0500  Weight: 116.1 kg 116 kg 117.3 kg    Physical Exam   GEN: Well nourished, well developed, in no acute distress.  HEENT: Grossly normal.  Neck: Supple, unable to estimate JVD, carotid bruits, or masses. Cardiac: RRR, no murmurs, rubs, or gallops. No clubbing, cyanosis, edema.  Radials 2+, DP/PT 2+ and equal bilaterally.  Respiratory:  Respirations regular and unlabored, clear to auscultation bilaterally. GI: Soft, nontender, nondistended, BS + x 4. MS: no deformity or atrophy. Skin: warm and dry, no rash. Neuro:  Strength and sensation are intact. Psych: AAOx3.  Normal affect.  Labs    Chemistry Recent Labs  Lab 05/18/22 0342 05/18/22 1449 05/19/22 0551 05/20/22 0445  NA 131* 135  135 133* 133*  K 3.8 3.7  3.8 3.3* 4.2  CL 93*  --  97* 99  CO2 26  --  27 28  GLUCOSE 246*  --  282* 320*  BUN 93*  --  74* 61*  CREATININE 1.38*  --  1.18* 1.11*  CALCIUM 8.5*  --  7.6* 8.2*  GFRNONAA 48*  --  57* >60  ANIONGAP 12  --  9 6     Hematology Recent Labs  Lab 05/18/22 0342 05/18/22 1449 05/19/22 0551 05/20/22 0445  WBC 13.7*  --  11.9* 12.0*  RBC 4.64  --  4.16 4.37  HGB 11.8* 11.9*  12.2 10.3* 10.8*  HCT 37.7 35.0*  36.0 34.3* 35.7*  MCV 81.3  --  82.5 81.7  MCH 25.4*  --  24.8* 24.7*  MCHC 31.3  --  30.0 30.3  RDW 14.6  --  14.9 14.8  PLT 311  --  291 280    Cardiac Enzymes No results for input(s): "TROPONINIHS" in the last 720 hours.    BNP    Component Value  Date/Time   BNP 86.5 05/15/2022 1131    ProBNP No results found for: "PROBNP"   DDimer No results for input(s): "DDIMER" in the last 168 hours.   Lipids  Lab Results  Component Value Date   CHOL 142 11/17/2021   HDL 48 11/17/2021   LDLCALC 43 11/17/2021   TRIG 253 (H) 11/17/2021   CHOLHDL 3.0 11/17/2021    HbA1c  Lab Results  Component Value Date   HGBA1C 14.3 (H) 05/02/2022    Radiology    CARDIAC CATHETERIZATION  Result Date: 05/18/2022 1. Elevated RA pressure > PCWP.  2. Severe RV failure with low PAPi 0.9 3. Mild pulmonary venous hypertension. 4. CI 2.3 Difficult situation, creatinine rising but still with significantly elevated right-sided filling pressures.  I will place PICC line.  Will start milrinone 0.125 for RV support.  Lasix 60 mg IV x 1 then gtt at 12 mg/hr. Follow CVP off PICC, exam very difficult.  Step-down bed.   [place] EKG SITE RITE  Result Date: 05/18/2022 If Site Rite image not attached, placement could not be confirmed due to current cardiac rhythm.   Telemetry    Normal sinus rhythm- Personally Reviewed  ECG     - Personally Reviewed  Cardiac Studies  Right heart catheterization  1. Elevated RA pressure > PCWP.   2. Severe RV failure with low PAPi 0.9 3. Mild pulmonary venous hypertension.  4. CI 2.3   Patient Profile     47 y.o. female with a history of polysubstance abuse, diabetes, seizure disorder, morbid obesity, hypertension, and chronic heart failure with preserved ejection fraction/severe R heart failure, who is being seen today for the evaluation of CHF exacerbation.   Assessment & Plan    1. Acute on chronic diastolic CHF:  Presenting with weight gain, shortness of breath Echo in 4/24 with EF 60-65%, mild LVH, normal RV. RHC on 5/9, she has prominent RV failure along with HFpEF (mean PCWP 24, PA 45/27, mean RA 20, PAPi 0.9, CI 2.3).   Treated with milrinone 0.125 infusion and Lasix 12 mg/hr infusion Lasix and milrinone weaned  off yesterday with CVP 7-8 with co-ox 82%.  -Would recommend we continue torsemide 60 daily, Farxiga 10 daily, spironolactone 25 daily -Close outpatient follow-up, needs help with transportation -Fluid restriction and daily weights recommended, may need extra torsemide for 3 pound weight gain Morbid obesity a contributor to symptoms  2. AKI:  cardiorenal syndrome with significant RV failure.  BUN/creatinine lower, stable  3.  Morbid obesity:  Would suggest GLP-1 agonist as outpatient if she qualifies Obesity a major contributor to her symptoms  4. HTN:  Continue spironolactone 25,  stopped irbesartan with rise in creatinine. BP stable.   5. OSA:  Strong suspicion for this.  Needs sleep study as outpatient.    Total encounter time more than 30 minutes  Greater than 50% was spent in counseling and coordination of care with the patient   Signed, Dossie Arbour, MD, Ph.D [facility]  For questions or updates, please contact   Please consult [email or web address] for contact info under Cardiology/STEMI.

## 2022-05-21 LAB — LIPOPROTEIN A (LPA): Lipoprotein (a): 45.4 nmol/L — ABNORMAL HIGH (ref <75.0)

## 2022-05-29 DIAGNOSIS — I10 Essential (primary) hypertension: Secondary | ICD-10-CM | POA: Diagnosis not present

## 2022-05-29 DIAGNOSIS — E1165 Type 2 diabetes mellitus with hyperglycemia: Secondary | ICD-10-CM | POA: Diagnosis not present

## 2022-05-29 DIAGNOSIS — I5030 Unspecified diastolic (congestive) heart failure: Secondary | ICD-10-CM | POA: Diagnosis not present

## 2022-06-07 ENCOUNTER — Encounter: Payer: Medicaid Other | Admitting: Cardiology

## 2022-07-19 ENCOUNTER — Emergency Department: Payer: 59

## 2022-07-19 ENCOUNTER — Emergency Department
Admission: EM | Admit: 2022-07-19 | Discharge: 2022-07-20 | Disposition: A | Discharge status: Other Acute Inpatient Hospital | Payer: 59 | Attending: Emergency Medicine | Admitting: Emergency Medicine

## 2022-07-19 DIAGNOSIS — Z4682 Encounter for fitting and adjustment of non-vascular catheter: Secondary | ICD-10-CM | POA: Diagnosis not present

## 2022-07-19 DIAGNOSIS — I618 Other nontraumatic intracerebral hemorrhage: Secondary | ICD-10-CM | POA: Insufficient documentation

## 2022-07-19 DIAGNOSIS — R000 Tachycardia, unspecified: Secondary | ICD-10-CM | POA: Diagnosis not present

## 2022-07-19 DIAGNOSIS — R4182 Altered mental status, unspecified: Secondary | ICD-10-CM | POA: Diagnosis not present

## 2022-07-19 DIAGNOSIS — E1165 Type 2 diabetes mellitus with hyperglycemia: Secondary | ICD-10-CM | POA: Insufficient documentation

## 2022-07-19 DIAGNOSIS — R0689 Other abnormalities of breathing: Secondary | ICD-10-CM | POA: Diagnosis not present

## 2022-07-19 DIAGNOSIS — J9600 Acute respiratory failure, unspecified whether with hypoxia or hypercapnia: Secondary | ICD-10-CM

## 2022-07-19 DIAGNOSIS — R739 Hyperglycemia, unspecified: Secondary | ICD-10-CM | POA: Diagnosis not present

## 2022-07-19 DIAGNOSIS — I614 Nontraumatic intracerebral hemorrhage in cerebellum: Secondary | ICD-10-CM | POA: Diagnosis not present

## 2022-07-19 DIAGNOSIS — I517 Cardiomegaly: Secondary | ICD-10-CM | POA: Diagnosis not present

## 2022-07-19 DIAGNOSIS — Z743 Need for continuous supervision: Secondary | ICD-10-CM | POA: Diagnosis not present

## 2022-07-19 DIAGNOSIS — Z01818 Encounter for other preprocedural examination: Secondary | ICD-10-CM

## 2022-07-19 DIAGNOSIS — R0989 Other specified symptoms and signs involving the circulatory and respiratory systems: Secondary | ICD-10-CM | POA: Diagnosis not present

## 2022-07-19 DIAGNOSIS — Z0189 Encounter for other specified special examinations: Secondary | ICD-10-CM

## 2022-07-19 DIAGNOSIS — I619 Nontraumatic intracerebral hemorrhage, unspecified: Secondary | ICD-10-CM

## 2022-07-19 DIAGNOSIS — R404 Transient alteration of awareness: Secondary | ICD-10-CM | POA: Diagnosis not present

## 2022-07-19 LAB — CBC WITH DIFFERENTIAL/PLATELET
Abs Immature Granulocytes: 0.03 10*3/uL (ref 0.00–0.07)
Basophils Absolute: 0.1 10*3/uL (ref 0.0–0.1)
Basophils Relative: 1 %
Eosinophils Absolute: 0.2 10*3/uL (ref 0.0–0.5)
Eosinophils Relative: 2 %
HCT: 44 % (ref 36.0–46.0)
Hemoglobin: 13.7 g/dL (ref 12.0–15.0)
Immature Granulocytes: 0 %
Lymphocytes Relative: 26 %
Lymphs Abs: 2.6 10*3/uL (ref 0.7–4.0)
MCH: 24.7 pg — ABNORMAL LOW (ref 26.0–34.0)
MCHC: 31.1 g/dL (ref 30.0–36.0)
MCV: 79.3 fL — ABNORMAL LOW (ref 80.0–100.0)
Monocytes Absolute: 0.6 10*3/uL (ref 0.1–1.0)
Monocytes Relative: 6 %
Neutro Abs: 6.5 10*3/uL (ref 1.7–7.7)
Neutrophils Relative %: 65 %
Platelets: 264 10*3/uL (ref 150–400)
RBC: 5.55 MIL/uL — ABNORMAL HIGH (ref 3.87–5.11)
RDW: 14.2 % (ref 11.5–15.5)
WBC: 9.9 10*3/uL (ref 4.0–10.5)
nRBC: 0 % (ref 0.0–0.2)

## 2022-07-19 LAB — CBG MONITORING, ED: Glucose-Capillary: 380 mg/dL — ABNORMAL HIGH (ref 70–99)

## 2022-07-19 MED ORDER — DROPERIDOL 2.5 MG/ML IJ SOLN
5.0000 mg | Freq: Once | INTRAMUSCULAR | Status: AC
  Administered 2022-07-19: 5 mg via INTRAVENOUS

## 2022-07-19 MED ORDER — LORAZEPAM 2 MG/ML IJ SOLN
INTRAMUSCULAR | Status: AC
  Administered 2022-07-19: 2 mg via INTRAVENOUS
  Filled 2022-07-19: qty 1

## 2022-07-19 MED ORDER — LORAZEPAM 2 MG/ML IJ SOLN: 2.0000 mg | Freq: Once | INTRAMUSCULAR | Status: AC

## 2022-07-19 NOTE — ED Notes (Signed)
EMS to give 2 of [facility]
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
## 2022-07-20 ENCOUNTER — Emergency Department: Payer: 59

## 2022-07-20 ENCOUNTER — Other Ambulatory Visit: Payer: Self-pay

## 2022-07-20 DIAGNOSIS — R9431 Abnormal electrocardiogram [ECG] [EKG]: Secondary | ICD-10-CM | POA: Diagnosis not present

## 2022-07-20 DIAGNOSIS — R918 Other nonspecific abnormal finding of lung field: Secondary | ICD-10-CM | POA: Diagnosis not present

## 2022-07-20 DIAGNOSIS — I615 Nontraumatic intracerebral hemorrhage, intraventricular: Secondary | ICD-10-CM | POA: Diagnosis not present

## 2022-07-20 DIAGNOSIS — F1910 Other psychoactive substance abuse, uncomplicated: Secondary | ICD-10-CM | POA: Diagnosis not present

## 2022-07-20 DIAGNOSIS — I6789 Other cerebrovascular disease: Secondary | ICD-10-CM | POA: Diagnosis not present

## 2022-07-20 DIAGNOSIS — I5030 Unspecified diastolic (congestive) heart failure: Secondary | ICD-10-CM | POA: Diagnosis not present

## 2022-07-20 DIAGNOSIS — Z4682 Encounter for fitting and adjustment of non-vascular catheter: Secondary | ICD-10-CM | POA: Diagnosis not present

## 2022-07-20 DIAGNOSIS — J9811 Atelectasis: Secondary | ICD-10-CM | POA: Diagnosis not present

## 2022-07-20 DIAGNOSIS — J9600 Acute respiratory failure, unspecified whether with hypoxia or hypercapnia: Secondary | ICD-10-CM | POA: Diagnosis not present

## 2022-07-20 DIAGNOSIS — I69252 Hemiplegia and hemiparesis following other nontraumatic intracranial hemorrhage affecting left dominant side: Secondary | ICD-10-CM | POA: Diagnosis not present

## 2022-07-20 DIAGNOSIS — I10 Essential (primary) hypertension: Secondary | ICD-10-CM | POA: Diagnosis not present

## 2022-07-20 DIAGNOSIS — R0989 Other specified symptoms and signs involving the circulatory and respiratory systems: Secondary | ICD-10-CM | POA: Diagnosis not present

## 2022-07-20 DIAGNOSIS — J9601 Acute respiratory failure with hypoxia: Secondary | ICD-10-CM | POA: Diagnosis not present

## 2022-07-20 DIAGNOSIS — Z9911 Dependence on respirator [ventilator] status: Secondary | ICD-10-CM | POA: Diagnosis not present

## 2022-07-20 DIAGNOSIS — G911 Obstructive hydrocephalus: Secondary | ICD-10-CM | POA: Diagnosis not present

## 2022-07-20 DIAGNOSIS — I959 Hypotension, unspecified: Secondary | ICD-10-CM | POA: Diagnosis not present

## 2022-07-20 DIAGNOSIS — I616 Nontraumatic intracerebral hemorrhage, multiple localized: Secondary | ICD-10-CM | POA: Diagnosis not present

## 2022-07-20 LAB — URINE DRUG SCREEN, QUALITATIVE (ARMC ONLY)
Amphetamines, Ur Screen: NOT DETECTED
Barbiturates, Ur Screen: NOT DETECTED
Benzodiazepine, Ur Scrn: NOT DETECTED
Cannabinoid 50 Ng, Ur ~~LOC~~: NOT DETECTED
Cocaine Metabolite,Ur ~~LOC~~: POSITIVE — AB
MDMA (Ecstasy)Ur Screen: NOT DETECTED
Methadone Scn, Ur: NOT DETECTED
Opiate, Ur Screen: NOT DETECTED
Phencyclidine (PCP) Ur S: NOT DETECTED
Tricyclic, Ur Screen: NOT DETECTED

## 2022-07-20 LAB — COMPREHENSIVE METABOLIC PANEL
ALT: 14 U/L (ref 0–44)
AST: 16 U/L (ref 15–41)
Albumin: 3.3 g/dL — ABNORMAL LOW (ref 3.5–5.0)
Alkaline Phosphatase: 84 U/L (ref 38–126)
Anion gap: 10 (ref 5–15)
BUN: 14 mg/dL (ref 6–20)
CO2: 28 mmol/L (ref 22–32)
Calcium: 9.1 mg/dL (ref 8.9–10.3)
Chloride: 91 mmol/L — ABNORMAL LOW (ref 98–111)
Creatinine, Ser: 0.71 mg/dL (ref 0.44–1.00)
GFR, Estimated: >60 mL/min (ref >60)
Glucose, Bld: 388 mg/dL — ABNORMAL HIGH (ref 70–99)
Potassium: 3.7 mmol/L (ref 3.5–5.1)
Sodium: 129 mmol/L — ABNORMAL LOW (ref 135–145)
Total Bilirubin: 0.6 mg/dL (ref 0.3–1.2)
Total Protein: 6.9 g/dL (ref 6.5–8.1)

## 2022-07-20 LAB — CBG MONITORING, ED: Glucose-Capillary: 447 mg/dL — ABNORMAL HIGH (ref 70–99)

## 2022-07-20 LAB — URINALYSIS, W/ REFLEX TO CULTURE (INFECTION SUSPECTED)
Bacteria, UA: NONE SEEN
Bilirubin Urine: NEGATIVE
Glucose, UA: >=500 mg/dL — AB
Ketones, ur: 5 mg/dL — AB
Leukocytes,Ua: NEGATIVE
Nitrite: NEGATIVE
Protein, ur: 100 mg/dL — AB
Specific Gravity, Urine: 1.013 (ref 1.005–1.030)
pH: 7 (ref 5.0–8.0)

## 2022-07-20 LAB — PROTIME-INR
INR: 0.9 (ref 0.8–1.2)
Prothrombin Time: 12.7 seconds (ref 11.4–15.2)

## 2022-07-20 LAB — BLOOD GAS, VENOUS
Acid-Base Excess: 7.1 mmol/L — ABNORMAL HIGH (ref 0.0–2.0)
Bicarbonate: 33.9 mmol/L — ABNORMAL HIGH (ref 20.0–28.0)
O2 Saturation: 49.6 %
Patient temperature: 37
pCO2, Ven: 56 mmHg (ref 44–60)
pH, Ven: 7.39 (ref 7.25–7.43)
pO2, Ven: <31 mmHg — CL (ref 32–45)

## 2022-07-20 LAB — LACTIC ACID, PLASMA: Lactic Acid, Venous: 1.3 mmol/L (ref 0.5–1.9)

## 2022-07-20 LAB — TROPONIN I (HIGH SENSITIVITY): Troponin I (High Sensitivity): 15 ng/L (ref <18)

## 2022-07-20 LAB — ETHANOL: Alcohol, Ethyl (B): <10 mg/dL (ref <10)

## 2022-07-20 LAB — LIPASE, BLOOD: Lipase: 34 U/L (ref 11–51)

## 2022-07-20 LAB — BRAIN NATRIURETIC PEPTIDE: B Natriuretic Peptide: 59.1 pg/mL (ref 0.0–100.0)

## 2022-07-20 MED ORDER — LABETALOL HCL 5 MG/ML IV SOLN
20.0000 mg | Freq: Once | INTRAVENOUS | Status: AC
  Administered 2022-07-20: 20 mg via INTRAVENOUS
  Filled 2022-07-20: qty 4

## 2022-07-20 MED ORDER — ETOMIDATE 2 MG/ML IV SOLN
20.0000 mg | Freq: Once | INTRAVENOUS | Status: AC
  Administered 2022-07-20: 20 mg via INTRAVENOUS
  Filled 2022-07-20: qty 10

## 2022-07-20 MED ORDER — TRANEXAMIC ACID-NACL 1000-0.7 MG/100ML-% IV SOLN
1000.0000 mg | INTRAVENOUS | Status: AC
  Administered 2022-07-20: 1000 mg via INTRAVENOUS
  Filled 2022-07-20: qty 100

## 2022-07-20 MED ORDER — PROPOFOL 1000 MG/100ML IV EMUL
5.0000 ug/kg/min | INTRAVENOUS | Status: DC
  Administered 2022-07-20 (×2): 10 ug/kg/min via INTRAVENOUS
  Administered 2022-07-20: 50 ug/kg/min via INTRAVENOUS
  Filled 2022-07-20 (×3): qty 100

## 2022-07-20 MED ORDER — LEVETIRACETAM IN NACL 1000 MG/100ML IV SOLN
1000.0000 mg | Freq: Once | INTRAVENOUS | Status: AC
  Administered 2022-07-20: 1000 mg via INTRAVENOUS
  Filled 2022-07-20: qty 100

## 2022-07-20 MED ORDER — FENTANYL 2500MCG IN NS 250ML (10MCG/ML) PREMIX INFUSION
150.0000 ug/h | INTRAVENOUS | Status: DC
  Administered 2022-07-20: 150 ug/h via INTRAVENOUS
  Filled 2022-07-20: qty 250

## 2022-07-20 MED ORDER — SODIUM CHLORIDE 0.9 % IV BOLUS
1000.0000 mL | Freq: Once | INTRAVENOUS | Status: AC
  Administered 2022-07-20: 1000 mL via INTRAVENOUS

## 2022-07-20 MED ORDER — SUCCINYLCHOLINE CHLORIDE 200 MG/10ML IV SOSY
150.0000 mg | PREFILLED_SYRINGE | Freq: Once | INTRAVENOUS | Status: AC
  Administered 2022-07-20: 150 mg via INTRAVENOUS
  Filled 2022-07-20: qty 10

## 2022-07-20 NOTE — ED Notes (Signed)
[facility], MEDICAL NECESSITY, TRANSFER SIGNATURE AND VS COMPLETED.

## 2022-07-20 NOTE — ED Notes (Signed)
Infusions transferred to [facility]
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
## 2022-07-20 NOTE — ED Notes (Signed)
Family updated as to patient's status. All of pts children at bedside to speak with EDP on pts condition and plan of care as well as need to transfer. Pt's next of kin, eldest daughter(Brianna Frieze) to sign transfer consent. Pts belongings given to Colin Mulders to take care of.

## 2022-07-20 NOTE — ED Notes (Signed)
[facility] transport request extra bottle of propofol for transportation.
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
## 2022-07-21 DIAGNOSIS — F1190 Opioid use, unspecified, uncomplicated: Secondary | ICD-10-CM | POA: Diagnosis not present

## 2022-07-21 DIAGNOSIS — I619 Nontraumatic intracerebral hemorrhage, unspecified: Secondary | ICD-10-CM | POA: Diagnosis not present

## 2022-07-21 DIAGNOSIS — I69252 Hemiplegia and hemiparesis following other nontraumatic intracranial hemorrhage affecting left dominant side: Secondary | ICD-10-CM | POA: Diagnosis not present

## 2022-07-21 DIAGNOSIS — I615 Nontraumatic intracerebral hemorrhage, intraventricular: Secondary | ICD-10-CM | POA: Diagnosis not present

## 2022-07-22 DIAGNOSIS — G911 Obstructive hydrocephalus: Secondary | ICD-10-CM | POA: Diagnosis not present

## 2022-07-22 DIAGNOSIS — I5030 Unspecified diastolic (congestive) heart failure: Secondary | ICD-10-CM | POA: Diagnosis not present

## 2022-07-22 DIAGNOSIS — I616 Nontraumatic intracerebral hemorrhage, multiple localized: Secondary | ICD-10-CM | POA: Diagnosis not present

## 2022-07-22 DIAGNOSIS — J811 Chronic pulmonary edema: Secondary | ICD-10-CM | POA: Diagnosis not present

## 2022-07-22 DIAGNOSIS — I615 Nontraumatic intracerebral hemorrhage, intraventricular: Secondary | ICD-10-CM | POA: Diagnosis not present

## 2022-07-22 DIAGNOSIS — F1910 Other psychoactive substance abuse, uncomplicated: Secondary | ICD-10-CM | POA: Diagnosis not present

## 2022-07-22 DIAGNOSIS — J9600 Acute respiratory failure, unspecified whether with hypoxia or hypercapnia: Secondary | ICD-10-CM | POA: Diagnosis not present

## 2022-07-22 DIAGNOSIS — I959 Hypotension, unspecified: Secondary | ICD-10-CM | POA: Diagnosis not present

## 2022-07-22 DIAGNOSIS — I619 Nontraumatic intracerebral hemorrhage, unspecified: Secondary | ICD-10-CM | POA: Diagnosis not present

## 2022-07-22 DIAGNOSIS — J9601 Acute respiratory failure with hypoxia: Secondary | ICD-10-CM | POA: Diagnosis not present

## 2022-07-22 DIAGNOSIS — F1190 Opioid use, unspecified, uncomplicated: Secondary | ICD-10-CM | POA: Diagnosis not present

## 2022-07-23 DIAGNOSIS — F1910 Other psychoactive substance abuse, uncomplicated: Secondary | ICD-10-CM | POA: Diagnosis not present

## 2022-07-23 DIAGNOSIS — J90 Pleural effusion, not elsewhere classified: Secondary | ICD-10-CM | POA: Diagnosis not present

## 2022-07-23 DIAGNOSIS — F1190 Opioid use, unspecified, uncomplicated: Secondary | ICD-10-CM | POA: Diagnosis not present

## 2022-07-23 DIAGNOSIS — I619 Nontraumatic intracerebral hemorrhage, unspecified: Secondary | ICD-10-CM | POA: Diagnosis not present

## 2022-07-23 DIAGNOSIS — I615 Nontraumatic intracerebral hemorrhage, intraventricular: Secondary | ICD-10-CM | POA: Diagnosis not present

## 2022-07-23 DIAGNOSIS — G911 Obstructive hydrocephalus: Secondary | ICD-10-CM | POA: Diagnosis not present

## 2022-07-23 DIAGNOSIS — J9601 Acute respiratory failure with hypoxia: Secondary | ICD-10-CM | POA: Diagnosis not present

## 2022-07-23 DIAGNOSIS — J9600 Acute respiratory failure, unspecified whether with hypoxia or hypercapnia: Secondary | ICD-10-CM | POA: Diagnosis not present

## 2022-07-23 DIAGNOSIS — I959 Hypotension, unspecified: Secondary | ICD-10-CM | POA: Diagnosis not present

## 2022-07-23 DIAGNOSIS — Z4682 Encounter for fitting and adjustment of non-vascular catheter: Secondary | ICD-10-CM | POA: Diagnosis not present

## 2022-07-23 DIAGNOSIS — I5030 Unspecified diastolic (congestive) heart failure: Secondary | ICD-10-CM | POA: Diagnosis not present

## 2022-07-23 DIAGNOSIS — Z452 Encounter for adjustment and management of vascular access device: Secondary | ICD-10-CM | POA: Diagnosis not present

## 2022-07-24 DIAGNOSIS — J9600 Acute respiratory failure, unspecified whether with hypoxia or hypercapnia: Secondary | ICD-10-CM | POA: Diagnosis not present

## 2022-07-24 DIAGNOSIS — G932 Benign intracranial hypertension: Secondary | ICD-10-CM | POA: Diagnosis not present

## 2022-07-24 DIAGNOSIS — Z4682 Encounter for fitting and adjustment of non-vascular catheter: Secondary | ICD-10-CM | POA: Diagnosis not present

## 2022-07-24 DIAGNOSIS — Z452 Encounter for adjustment and management of vascular access device: Secondary | ICD-10-CM | POA: Diagnosis not present

## 2022-07-24 DIAGNOSIS — R918 Other nonspecific abnormal finding of lung field: Secondary | ICD-10-CM | POA: Diagnosis not present

## 2022-07-24 DIAGNOSIS — I610 Nontraumatic intracerebral hemorrhage in hemisphere, subcortical: Secondary | ICD-10-CM | POA: Diagnosis not present

## 2022-07-24 LAB — DRUG PROFILE, UR, 9 DRUGS (LABCORP)
Amphetamines, Urine: NEGATIVE ng/mL
Barbiturate, Ur: NEGATIVE ng/mL
Benzodiazepine Quant, Ur: NEGATIVE ng/mL
Cannabinoid Quant, Ur: NEGATIVE ng/mL
Cocaine (Metab.): POSITIVE ng/mL — AB
Methadone Screen, Urine: NEGATIVE ng/mL
Opiate Quant, Ur: NEGATIVE ng/mL
Phencyclidine, Ur: NEGATIVE ng/mL
Propoxyphene, Urine: NEGATIVE ng/mL

## 2022-07-24 LAB — CULTURE, BLOOD (SINGLE)
Culture: NO GROWTH
Special Requests: ADEQUATE

## 2022-07-25 DIAGNOSIS — G911 Obstructive hydrocephalus: Secondary | ICD-10-CM | POA: Diagnosis not present

## 2022-07-25 DIAGNOSIS — I959 Hypotension, unspecified: Secondary | ICD-10-CM | POA: Diagnosis not present

## 2022-07-25 DIAGNOSIS — J9601 Acute respiratory failure with hypoxia: Secondary | ICD-10-CM | POA: Diagnosis not present

## 2022-07-25 DIAGNOSIS — I5030 Unspecified diastolic (congestive) heart failure: Secondary | ICD-10-CM | POA: Diagnosis not present

## 2022-07-25 DIAGNOSIS — F1910 Other psychoactive substance abuse, uncomplicated: Secondary | ICD-10-CM | POA: Diagnosis not present

## 2022-07-25 DIAGNOSIS — I615 Nontraumatic intracerebral hemorrhage, intraventricular: Secondary | ICD-10-CM | POA: Diagnosis not present

## 2022-07-25 DIAGNOSIS — I616 Nontraumatic intracerebral hemorrhage, multiple localized: Secondary | ICD-10-CM | POA: Diagnosis not present

## 2022-07-26 DIAGNOSIS — I110 Hypertensive heart disease with heart failure: Secondary | ICD-10-CM | POA: Diagnosis not present

## 2022-07-26 DIAGNOSIS — E1159 Type 2 diabetes mellitus with other circulatory complications: Secondary | ICD-10-CM | POA: Diagnosis not present

## 2022-07-26 DIAGNOSIS — Z4682 Encounter for fitting and adjustment of non-vascular catheter: Secondary | ICD-10-CM | POA: Diagnosis not present

## 2022-07-26 DIAGNOSIS — J9601 Acute respiratory failure with hypoxia: Secondary | ICD-10-CM | POA: Diagnosis not present

## 2022-07-26 DIAGNOSIS — E1165 Type 2 diabetes mellitus with hyperglycemia: Secondary | ICD-10-CM | POA: Diagnosis not present

## 2022-07-26 DIAGNOSIS — J9600 Acute respiratory failure, unspecified whether with hypoxia or hypercapnia: Secondary | ICD-10-CM | POA: Diagnosis not present

## 2022-07-26 DIAGNOSIS — N179 Acute kidney failure, unspecified: Secondary | ICD-10-CM | POA: Diagnosis not present

## 2022-07-26 DIAGNOSIS — E669 Obesity, unspecified: Secondary | ICD-10-CM | POA: Diagnosis not present

## 2022-07-26 DIAGNOSIS — F1910 Other psychoactive substance abuse, uncomplicated: Secondary | ICD-10-CM | POA: Diagnosis not present

## 2022-07-26 DIAGNOSIS — Z6841 Body Mass Index (BMI) 40.0 and over, adult: Secondary | ICD-10-CM | POA: Diagnosis not present

## 2022-07-26 DIAGNOSIS — J90 Pleural effusion, not elsewhere classified: Secondary | ICD-10-CM | POA: Diagnosis not present

## 2022-07-26 DIAGNOSIS — I5030 Unspecified diastolic (congestive) heart failure: Secondary | ICD-10-CM | POA: Diagnosis not present

## 2022-07-26 DIAGNOSIS — I615 Nontraumatic intracerebral hemorrhage, intraventricular: Secondary | ICD-10-CM | POA: Diagnosis not present

## 2022-07-26 DIAGNOSIS — J9811 Atelectasis: Secondary | ICD-10-CM | POA: Diagnosis not present

## 2022-07-26 DIAGNOSIS — G911 Obstructive hydrocephalus: Secondary | ICD-10-CM | POA: Diagnosis not present

## 2022-07-27 DIAGNOSIS — Z4682 Encounter for fitting and adjustment of non-vascular catheter: Secondary | ICD-10-CM | POA: Diagnosis not present

## 2022-07-27 DIAGNOSIS — E1159 Type 2 diabetes mellitus with other circulatory complications: Secondary | ICD-10-CM | POA: Diagnosis not present

## 2022-07-27 DIAGNOSIS — E1165 Type 2 diabetes mellitus with hyperglycemia: Secondary | ICD-10-CM | POA: Diagnosis not present

## 2022-07-27 DIAGNOSIS — F1910 Other psychoactive substance abuse, uncomplicated: Secondary | ICD-10-CM | POA: Diagnosis not present

## 2022-07-27 DIAGNOSIS — I615 Nontraumatic intracerebral hemorrhage, intraventricular: Secondary | ICD-10-CM | POA: Diagnosis not present

## 2022-07-27 DIAGNOSIS — Z6841 Body Mass Index (BMI) 40.0 and over, adult: Secondary | ICD-10-CM | POA: Diagnosis not present

## 2022-07-27 DIAGNOSIS — N179 Acute kidney failure, unspecified: Secondary | ICD-10-CM | POA: Diagnosis not present

## 2022-07-27 DIAGNOSIS — E669 Obesity, unspecified: Secondary | ICD-10-CM | POA: Diagnosis not present

## 2022-07-27 DIAGNOSIS — J9811 Atelectasis: Secondary | ICD-10-CM | POA: Diagnosis not present

## 2022-07-27 DIAGNOSIS — J90 Pleural effusion, not elsewhere classified: Secondary | ICD-10-CM | POA: Diagnosis not present

## 2022-07-27 DIAGNOSIS — J9601 Acute respiratory failure with hypoxia: Secondary | ICD-10-CM | POA: Diagnosis not present

## 2022-07-27 DIAGNOSIS — G911 Obstructive hydrocephalus: Secondary | ICD-10-CM | POA: Diagnosis not present

## 2022-07-27 DIAGNOSIS — I5030 Unspecified diastolic (congestive) heart failure: Secondary | ICD-10-CM | POA: Diagnosis not present

## 2022-07-27 DIAGNOSIS — I110 Hypertensive heart disease with heart failure: Secondary | ICD-10-CM | POA: Diagnosis not present

## 2022-07-27 DIAGNOSIS — J9600 Acute respiratory failure, unspecified whether with hypoxia or hypercapnia: Secondary | ICD-10-CM | POA: Diagnosis not present

## 2022-07-28 DIAGNOSIS — I82611 Acute embolism and thrombosis of superficial veins of right upper extremity: Secondary | ICD-10-CM | POA: Diagnosis not present

## 2022-07-28 DIAGNOSIS — I615 Nontraumatic intracerebral hemorrhage, intraventricular: Secondary | ICD-10-CM | POA: Diagnosis not present

## 2022-07-28 DIAGNOSIS — J90 Pleural effusion, not elsewhere classified: Secondary | ICD-10-CM | POA: Diagnosis not present

## 2022-07-28 DIAGNOSIS — I5030 Unspecified diastolic (congestive) heart failure: Secondary | ICD-10-CM | POA: Diagnosis not present

## 2022-07-28 DIAGNOSIS — E669 Obesity, unspecified: Secondary | ICD-10-CM | POA: Diagnosis not present

## 2022-07-28 DIAGNOSIS — J9600 Acute respiratory failure, unspecified whether with hypoxia or hypercapnia: Secondary | ICD-10-CM | POA: Diagnosis not present

## 2022-07-28 DIAGNOSIS — E1159 Type 2 diabetes mellitus with other circulatory complications: Secondary | ICD-10-CM | POA: Diagnosis not present

## 2022-07-28 DIAGNOSIS — I110 Hypertensive heart disease with heart failure: Secondary | ICD-10-CM | POA: Diagnosis not present

## 2022-07-28 DIAGNOSIS — E1165 Type 2 diabetes mellitus with hyperglycemia: Secondary | ICD-10-CM | POA: Diagnosis not present

## 2022-07-28 DIAGNOSIS — G911 Obstructive hydrocephalus: Secondary | ICD-10-CM | POA: Diagnosis not present

## 2022-07-28 DIAGNOSIS — Z6841 Body Mass Index (BMI) 40.0 and over, adult: Secondary | ICD-10-CM | POA: Diagnosis not present

## 2022-07-28 DIAGNOSIS — N179 Acute kidney failure, unspecified: Secondary | ICD-10-CM | POA: Diagnosis not present

## 2022-07-28 DIAGNOSIS — F1910 Other psychoactive substance abuse, uncomplicated: Secondary | ICD-10-CM | POA: Diagnosis not present

## 2022-07-28 DIAGNOSIS — J9601 Acute respiratory failure with hypoxia: Secondary | ICD-10-CM | POA: Diagnosis not present

## 2022-07-29 DIAGNOSIS — J90 Pleural effusion, not elsewhere classified: Secondary | ICD-10-CM | POA: Diagnosis not present

## 2022-07-29 DIAGNOSIS — E1165 Type 2 diabetes mellitus with hyperglycemia: Secondary | ICD-10-CM | POA: Diagnosis not present

## 2022-07-29 DIAGNOSIS — J9811 Atelectasis: Secondary | ICD-10-CM | POA: Diagnosis not present

## 2022-07-29 DIAGNOSIS — I610 Nontraumatic intracerebral hemorrhage in hemisphere, subcortical: Secondary | ICD-10-CM | POA: Diagnosis not present

## 2022-07-29 DIAGNOSIS — F1910 Other psychoactive substance abuse, uncomplicated: Secondary | ICD-10-CM | POA: Diagnosis not present

## 2022-07-29 DIAGNOSIS — N179 Acute kidney failure, unspecified: Secondary | ICD-10-CM | POA: Diagnosis not present

## 2022-07-29 DIAGNOSIS — I5030 Unspecified diastolic (congestive) heart failure: Secondary | ICD-10-CM | POA: Diagnosis not present

## 2022-07-29 DIAGNOSIS — I6389 Other cerebral infarction: Secondary | ICD-10-CM | POA: Diagnosis not present

## 2022-07-29 DIAGNOSIS — E1159 Type 2 diabetes mellitus with other circulatory complications: Secondary | ICD-10-CM | POA: Diagnosis not present

## 2022-07-29 DIAGNOSIS — G932 Benign intracranial hypertension: Secondary | ICD-10-CM | POA: Diagnosis not present

## 2022-07-29 DIAGNOSIS — I110 Hypertensive heart disease with heart failure: Secondary | ICD-10-CM | POA: Diagnosis not present

## 2022-07-29 DIAGNOSIS — G919 Hydrocephalus, unspecified: Secondary | ICD-10-CM | POA: Diagnosis not present

## 2022-07-29 DIAGNOSIS — I615 Nontraumatic intracerebral hemorrhage, intraventricular: Secondary | ICD-10-CM | POA: Diagnosis not present

## 2022-07-29 DIAGNOSIS — J9601 Acute respiratory failure with hypoxia: Secondary | ICD-10-CM | POA: Diagnosis not present

## 2022-07-29 DIAGNOSIS — Z6841 Body Mass Index (BMI) 40.0 and over, adult: Secondary | ICD-10-CM | POA: Diagnosis not present

## 2022-07-29 DIAGNOSIS — J9600 Acute respiratory failure, unspecified whether with hypoxia or hypercapnia: Secondary | ICD-10-CM | POA: Diagnosis not present

## 2022-07-29 DIAGNOSIS — G911 Obstructive hydrocephalus: Secondary | ICD-10-CM | POA: Diagnosis not present

## 2022-07-29 DIAGNOSIS — E669 Obesity, unspecified: Secondary | ICD-10-CM | POA: Diagnosis not present

## 2022-08-10 DEATH — deceased
